# Patient Record
Sex: Female | Born: 1940 | Race: White | Hispanic: No | Marital: Married | Smoking: Former smoker
Health system: Southern US, Community
[De-identification: ages and names within clinical notes are randomized; demographics above are authoritative.]

## PROBLEM LIST (undated history)

## (undated) DIAGNOSIS — R0600 Dyspnea, unspecified: Secondary | ICD-10-CM

## (undated) DIAGNOSIS — R0609 Other forms of dyspnea: Secondary | ICD-10-CM

## (undated) DIAGNOSIS — I10 Essential (primary) hypertension: Secondary | ICD-10-CM

## (undated) DIAGNOSIS — I2510 Atherosclerotic heart disease of native coronary artery without angina pectoris: Secondary | ICD-10-CM

## (undated) DIAGNOSIS — C801 Malignant (primary) neoplasm, unspecified: Secondary | ICD-10-CM

## (undated) DIAGNOSIS — Z9889 Other specified postprocedural states: Secondary | ICD-10-CM

## (undated) DIAGNOSIS — C4490 Unspecified malignant neoplasm of skin, unspecified: Secondary | ICD-10-CM

## (undated) DIAGNOSIS — E785 Hyperlipidemia, unspecified: Secondary | ICD-10-CM

## (undated) DIAGNOSIS — R112 Nausea with vomiting, unspecified: Secondary | ICD-10-CM

## (undated) DIAGNOSIS — E119 Type 2 diabetes mellitus without complications: Secondary | ICD-10-CM

## (undated) DIAGNOSIS — Z955 Presence of coronary angioplasty implant and graft: Secondary | ICD-10-CM

## (undated) DIAGNOSIS — R51 Headache: Secondary | ICD-10-CM

## (undated) DIAGNOSIS — G8929 Other chronic pain: Secondary | ICD-10-CM

## (undated) DIAGNOSIS — I639 Cerebral infarction, unspecified: Secondary | ICD-10-CM

## (undated) DIAGNOSIS — F32A Depression, unspecified: Secondary | ICD-10-CM

## (undated) DIAGNOSIS — Z8489 Family history of other specified conditions: Secondary | ICD-10-CM

## (undated) DIAGNOSIS — Z800 Family history of malignant neoplasm of digestive organs: Secondary | ICD-10-CM

## (undated) DIAGNOSIS — S069X9A Unspecified intracranial injury with loss of consciousness of unspecified duration, initial encounter: Secondary | ICD-10-CM

## (undated) DIAGNOSIS — Z9289 Personal history of other medical treatment: Secondary | ICD-10-CM

## (undated) DIAGNOSIS — M8580 Other specified disorders of bone density and structure, unspecified site: Secondary | ICD-10-CM

## (undated) DIAGNOSIS — E039 Hypothyroidism, unspecified: Secondary | ICD-10-CM

## (undated) DIAGNOSIS — J189 Pneumonia, unspecified organism: Secondary | ICD-10-CM

## (undated) DIAGNOSIS — M1990 Unspecified osteoarthritis, unspecified site: Secondary | ICD-10-CM

## (undated) DIAGNOSIS — M47812 Spondylosis without myelopathy or radiculopathy, cervical region: Secondary | ICD-10-CM

## (undated) DIAGNOSIS — K219 Gastro-esophageal reflux disease without esophagitis: Secondary | ICD-10-CM

## (undated) DIAGNOSIS — IMO0001 Reserved for inherently not codable concepts without codable children: Secondary | ICD-10-CM

## (undated) DIAGNOSIS — F419 Anxiety disorder, unspecified: Secondary | ICD-10-CM

## (undated) DIAGNOSIS — F329 Major depressive disorder, single episode, unspecified: Secondary | ICD-10-CM

## (undated) DIAGNOSIS — J42 Unspecified chronic bronchitis: Secondary | ICD-10-CM

## (undated) DIAGNOSIS — J309 Allergic rhinitis, unspecified: Secondary | ICD-10-CM

## (undated) DIAGNOSIS — N939 Abnormal uterine and vaginal bleeding, unspecified: Secondary | ICD-10-CM

## (undated) DIAGNOSIS — G4730 Sleep apnea, unspecified: Secondary | ICD-10-CM

## (undated) DIAGNOSIS — E079 Disorder of thyroid, unspecified: Secondary | ICD-10-CM

## (undated) DIAGNOSIS — Z87442 Personal history of urinary calculi: Secondary | ICD-10-CM

## (undated) DIAGNOSIS — M549 Dorsalgia, unspecified: Secondary | ICD-10-CM

## (undated) DIAGNOSIS — I509 Heart failure, unspecified: Secondary | ICD-10-CM

## (undated) DIAGNOSIS — Z87440 Personal history of urinary (tract) infections: Secondary | ICD-10-CM

## (undated) DIAGNOSIS — R519 Headache, unspecified: Secondary | ICD-10-CM

## (undated) DIAGNOSIS — Z872 Personal history of diseases of the skin and subcutaneous tissue: Secondary | ICD-10-CM

## (undated) HISTORY — PX: CARDIAC CATHETERIZATION: SHX172

## (undated) HISTORY — PX: SKIN CANCER EXCISION: SHX779

## (undated) HISTORY — PX: CATARACT EXTRACTION, BILATERAL: SHX1313

## (undated) HISTORY — PX: HX COLONOSCOPY: 2100001147

## (undated) HISTORY — PX: HX EYE SURGERY: 2100001143

## (undated) HISTORY — PX: LAPAROSCOPIC CHOLECYSTECTOMY: SUR755

## (undated) HISTORY — DX: Essential (primary) hypertension: I10

## (undated) HISTORY — PX: HX PARTIAL HYSTERECTOMY: SHX80

## (undated) HISTORY — PX: HX WISDOM TEETH EXTRACTION: SHX21

## (undated) HISTORY — DX: Unspecified malignant neoplasm of skin, unspecified: C44.90

## (undated) HISTORY — PX: HX HEART CATHETERIZATION: SHX148

## (undated) HISTORY — DX: Atherosclerotic heart disease of native coronary artery without angina pectoris: I25.10

## (undated) HISTORY — DX: Spondylosis without myelopathy or radiculopathy, cervical region: M47.812

## (undated) HISTORY — DX: Gastro-esophageal reflux disease without esophagitis: K21.9

## (undated) HISTORY — PX: TOTAL THYROIDECTOMY: SHX2547

## (undated) HISTORY — DX: Personal history of urinary (tract) infections: Z87.440

## (undated) HISTORY — DX: Personal history of diseases of the skin and subcutaneous tissue: Z87.2

## (undated) HISTORY — DX: Family history of malignant neoplasm of digestive organs: Z80.0

## (undated) HISTORY — PX: NASAL FRACTURE SURGERY: SHX718

## (undated) HISTORY — DX: Other specified disorders of bone density and structure, unspecified site: M85.80

## (undated) HISTORY — PX: CORONARY ANGIOPLASTY: SHX604

## (undated) HISTORY — DX: Hyperlipidemia, unspecified: E78.5

## (undated) HISTORY — PX: COLONOSCOPY: SHX174

## (undated) HISTORY — DX: Disorder of thyroid, unspecified: E07.9

## (undated) HISTORY — DX: Abnormal uterine and vaginal bleeding, unspecified: N93.9

## (undated) HISTORY — DX: Allergic rhinitis, unspecified: J30.9

## (undated) HISTORY — PX: VAGINAL HYSTERECTOMY: SUR661

## (undated) HISTORY — DX: Anxiety disorder, unspecified: F41.9

---

## 1968-12-03 DIAGNOSIS — E079 Disorder of thyroid, unspecified: Secondary | ICD-10-CM

## 1968-12-03 HISTORY — DX: Disorder of thyroid, unspecified: E07.9

## 1976-12-03 HISTORY — PX: HX THYROIDECTOMY: SHX17

## 1978-12-03 HISTORY — PX: RECONSTRUCTION OF NOSE: SHX2301

## 1998-08-15 ENCOUNTER — Ambulatory Visit (HOSPITAL_COMMUNITY): Admission: RE | Admit: 1998-08-15 | Discharge: 1998-08-15 | Payer: Self-pay | Admitting: *Deleted

## 1998-09-07 ENCOUNTER — Ambulatory Visit (HOSPITAL_COMMUNITY): Admission: RE | Admit: 1998-09-07 | Discharge: 1998-09-08 | Payer: Self-pay | Admitting: *Deleted

## 1999-12-08 ENCOUNTER — Encounter: Payer: Self-pay | Admitting: *Deleted

## 1999-12-08 ENCOUNTER — Ambulatory Visit (HOSPITAL_COMMUNITY): Admission: RE | Admit: 1999-12-08 | Discharge: 1999-12-08 | Payer: Self-pay | Admitting: *Deleted

## 2000-05-28 ENCOUNTER — Encounter: Admission: RE | Admit: 2000-05-28 | Discharge: 2000-05-28 | Payer: Self-pay | Admitting: *Deleted

## 2000-05-28 ENCOUNTER — Encounter: Payer: Self-pay | Admitting: *Deleted

## 2001-06-06 ENCOUNTER — Encounter: Payer: Self-pay | Admitting: Internal Medicine

## 2001-06-06 ENCOUNTER — Encounter: Admission: RE | Admit: 2001-06-06 | Discharge: 2001-06-06 | Payer: Self-pay | Admitting: Internal Medicine

## 2002-07-03 ENCOUNTER — Encounter: Payer: Self-pay | Admitting: Internal Medicine

## 2002-07-03 ENCOUNTER — Encounter: Admission: RE | Admit: 2002-07-03 | Discharge: 2002-07-03 | Payer: Self-pay | Admitting: Internal Medicine

## 2003-08-05 ENCOUNTER — Encounter: Payer: Self-pay | Admitting: Internal Medicine

## 2003-08-05 ENCOUNTER — Encounter: Admission: RE | Admit: 2003-08-05 | Discharge: 2003-08-05 | Payer: Self-pay | Admitting: Internal Medicine

## 2004-08-22 ENCOUNTER — Ambulatory Visit (HOSPITAL_COMMUNITY): Admission: RE | Admit: 2004-08-22 | Discharge: 2004-08-22 | Payer: Self-pay | Admitting: Internal Medicine

## 2004-09-04 ENCOUNTER — Encounter: Admission: RE | Admit: 2004-09-04 | Discharge: 2004-09-04 | Payer: Self-pay | Admitting: Internal Medicine

## 2004-12-05 ENCOUNTER — Other Ambulatory Visit: Admission: RE | Admit: 2004-12-05 | Discharge: 2004-12-05 | Payer: Self-pay | Admitting: Internal Medicine

## 2005-07-06 ENCOUNTER — Other Ambulatory Visit: Admission: RE | Admit: 2005-07-06 | Discharge: 2005-07-06 | Payer: Self-pay | Admitting: Obstetrics and Gynecology

## 2005-08-15 ENCOUNTER — Encounter: Admission: RE | Admit: 2005-08-15 | Discharge: 2005-08-15 | Payer: Self-pay | Admitting: Obstetrics and Gynecology

## 2005-10-19 ENCOUNTER — Ambulatory Visit (HOSPITAL_COMMUNITY): Admission: RE | Admit: 2005-10-19 | Discharge: 2005-10-19 | Payer: Self-pay | Admitting: Gastroenterology

## 2005-11-16 ENCOUNTER — Ambulatory Visit (HOSPITAL_COMMUNITY): Admission: RE | Admit: 2005-11-16 | Discharge: 2005-11-16 | Payer: Self-pay | Admitting: Internal Medicine

## 2007-01-10 ENCOUNTER — Ambulatory Visit (HOSPITAL_COMMUNITY): Admission: RE | Admit: 2007-01-10 | Discharge: 2007-01-10 | Payer: Self-pay | Admitting: Internal Medicine

## 2007-12-04 DIAGNOSIS — I639 Cerebral infarction, unspecified: Secondary | ICD-10-CM

## 2007-12-04 HISTORY — DX: Cerebral infarction, unspecified: I63.9

## 2008-02-06 ENCOUNTER — Ambulatory Visit (HOSPITAL_COMMUNITY): Admission: RE | Admit: 2008-02-06 | Discharge: 2008-02-06 | Payer: Self-pay | Admitting: Internal Medicine

## 2008-02-23 ENCOUNTER — Encounter: Admission: RE | Admit: 2008-02-23 | Discharge: 2008-02-23 | Payer: Self-pay | Admitting: Internal Medicine

## 2008-12-03 DIAGNOSIS — I639 Cerebral infarction, unspecified: Secondary | ICD-10-CM

## 2008-12-03 HISTORY — DX: Cerebral infarction, unspecified: I63.9

## 2009-02-25 ENCOUNTER — Ambulatory Visit (HOSPITAL_COMMUNITY): Admission: RE | Admit: 2009-02-25 | Discharge: 2009-02-25 | Payer: Self-pay | Admitting: Internal Medicine

## 2009-09-02 ENCOUNTER — Encounter: Admission: RE | Admit: 2009-09-02 | Discharge: 2009-09-02 | Payer: Self-pay | Admitting: Internal Medicine

## 2010-03-17 ENCOUNTER — Ambulatory Visit (HOSPITAL_COMMUNITY): Admission: RE | Admit: 2010-03-17 | Discharge: 2010-03-17 | Payer: Self-pay | Admitting: Internal Medicine

## 2010-08-31 ENCOUNTER — Encounter: Admission: RE | Admit: 2010-08-31 | Discharge: 2010-08-31 | Payer: Self-pay | Admitting: Internal Medicine

## 2010-12-13 ENCOUNTER — Encounter
Admission: RE | Admit: 2010-12-13 | Discharge: 2010-12-13 | Payer: Self-pay | Source: Home / Self Care | Attending: Internal Medicine | Admitting: Internal Medicine

## 2010-12-18 ENCOUNTER — Encounter
Admission: RE | Admit: 2010-12-18 | Discharge: 2010-12-18 | Payer: Self-pay | Source: Home / Self Care | Attending: Internal Medicine | Admitting: Internal Medicine

## 2010-12-24 ENCOUNTER — Encounter: Payer: Self-pay | Admitting: Obstetrics and Gynecology

## 2011-03-30 ENCOUNTER — Other Ambulatory Visit (HOSPITAL_COMMUNITY): Payer: Self-pay | Admitting: Internal Medicine

## 2011-03-30 DIAGNOSIS — Z1231 Encounter for screening mammogram for malignant neoplasm of breast: Secondary | ICD-10-CM

## 2011-04-11 ENCOUNTER — Ambulatory Visit (HOSPITAL_COMMUNITY)
Admission: RE | Admit: 2011-04-11 | Discharge: 2011-04-11 | Disposition: A | Discharge status: Home / Self Care | Payer: Medicare Other | Source: Ambulatory Visit | Attending: Internal Medicine | Admitting: Internal Medicine

## 2011-04-11 DIAGNOSIS — Z1231 Encounter for screening mammogram for malignant neoplasm of breast: Secondary | ICD-10-CM | POA: Insufficient documentation

## 2011-04-20 NOTE — Op Note (Signed)
NAME:  Barbara Carroll, Barbara Carroll NO.:  [id number]   MEDICAL RECORD NO.:  [id number]          PATIENT TYPE:  AMB   LOCATION:  ENDO                         FACILITY:  [facility]   PHYSICIAN:  Graylin Shiver, M.D.   DATE OF BIRTH:  1941/01/10   DATE OF PROCEDURE:  10/19/2005  DATE OF DISCHARGE:                                 OPERATIVE REPORT   PROCEDURE:  Colonoscopy.   INDICATIONS FOR PROCEDURE:  Family history of colon cancer in the patient's  mother.   Informed consent was obtained after explanation of the risks of bleeding,  infection and perforation.   PREMEDICATION:  Fentanyl 75 mcg IV, Versed 7 mg IV.   PROCEDURE:  With the patient in the left lateral decubitus position, a  rectal exam was performed, no masses were felt. The Olympus colonoscope was  inserted into the rectum and advanced around the colon to the cecum. Cecal  landmarks were identified. The cecum and ascending colon were normal. The  transverse colon normal. The descending colon and sigmoid revealed a few  diverticula, the rectum was normal. She tolerated the procedure well without  complications.   IMPRESSION:  Diverticulosis of the left colon.   I would recommend a follow-up screening colonoscopy again in 5 years due to  the family history.           ______________________________  Graylin Shiver, M.D.     SFG/MEDQ  D:  10/19/2005  T:  10/19/2005  Job:  [id number]   cc:   Leonette Most A. Sydnee Cabal, MD  Fax: [phone]

## 2011-12-03 ENCOUNTER — Other Ambulatory Visit: Payer: Self-pay | Admitting: Internal Medicine

## 2011-12-03 ENCOUNTER — Encounter: Payer: Self-pay | Admitting: Internal Medicine

## 2011-12-03 ENCOUNTER — Ambulatory Visit
Admission: RE | Admit: 2011-12-03 | Discharge: 2011-12-03 | Disposition: A | Discharge status: Home / Self Care | Payer: Medicare Other | Source: Ambulatory Visit | Attending: Internal Medicine | Admitting: Internal Medicine

## 2011-12-27 ENCOUNTER — Other Ambulatory Visit: Payer: Self-pay | Admitting: Internal Medicine

## 2011-12-27 DIAGNOSIS — R200 Anesthesia of skin: Secondary | ICD-10-CM

## 2011-12-28 ENCOUNTER — Other Ambulatory Visit: Payer: Medicare Other

## 2011-12-31 ENCOUNTER — Ambulatory Visit
Admission: RE | Admit: 2011-12-31 | Discharge: 2011-12-31 | Disposition: A | Discharge status: Home / Self Care | Payer: Medicare Other | Source: Ambulatory Visit | Attending: Internal Medicine | Admitting: Internal Medicine

## 2011-12-31 DIAGNOSIS — R200 Anesthesia of skin: Secondary | ICD-10-CM

## 2012-01-21 ENCOUNTER — Other Ambulatory Visit: Payer: Self-pay | Admitting: Neurosurgery

## 2012-01-21 DIAGNOSIS — M542 Cervicalgia: Secondary | ICD-10-CM

## 2012-01-21 DIAGNOSIS — R29810 Facial weakness: Secondary | ICD-10-CM

## 2012-01-28 ENCOUNTER — Ambulatory Visit
Admission: RE | Admit: 2012-01-28 | Discharge: 2012-01-28 | Disposition: A | Discharge status: Home / Self Care | Payer: Medicare Other | Source: Ambulatory Visit | Attending: Neurosurgery | Admitting: Neurosurgery

## 2012-01-28 DIAGNOSIS — R29810 Facial weakness: Secondary | ICD-10-CM

## 2012-01-28 DIAGNOSIS — M542 Cervicalgia: Secondary | ICD-10-CM

## 2012-01-28 MED ORDER — GADOBENATE DIMEGLUMINE 529 MG/ML IV SOLN
15.0000 mL | Freq: Once | INTRAVENOUS | Status: AC | PRN
  Administered 2012-01-28: 15 mL via INTRAVENOUS

## 2012-04-30 ENCOUNTER — Other Ambulatory Visit (HOSPITAL_COMMUNITY): Payer: Self-pay | Admitting: Internal Medicine

## 2012-04-30 DIAGNOSIS — Z1231 Encounter for screening mammogram for malignant neoplasm of breast: Secondary | ICD-10-CM

## 2012-05-23 ENCOUNTER — Ambulatory Visit (HOSPITAL_COMMUNITY)
Admission: RE | Admit: 2012-05-23 | Discharge: 2012-05-23 | Disposition: A | Discharge status: Home / Self Care | Payer: Medicare Other | Source: Ambulatory Visit | Attending: Internal Medicine | Admitting: Internal Medicine

## 2012-05-23 DIAGNOSIS — Z1231 Encounter for screening mammogram for malignant neoplasm of breast: Secondary | ICD-10-CM | POA: Insufficient documentation

## 2013-01-19 ENCOUNTER — Other Ambulatory Visit: Payer: Self-pay | Admitting: Internal Medicine

## 2013-01-19 DIAGNOSIS — R52 Pain, unspecified: Secondary | ICD-10-CM

## 2013-01-19 DIAGNOSIS — R609 Edema, unspecified: Secondary | ICD-10-CM

## 2013-01-21 ENCOUNTER — Ambulatory Visit
Admission: RE | Admit: 2013-01-21 | Discharge: 2013-01-21 | Disposition: A | Discharge status: Home / Self Care | Payer: Medicare Other | Source: Ambulatory Visit | Attending: Internal Medicine | Admitting: Internal Medicine

## 2013-01-21 DIAGNOSIS — R609 Edema, unspecified: Secondary | ICD-10-CM

## 2013-01-21 DIAGNOSIS — R52 Pain, unspecified: Secondary | ICD-10-CM

## 2013-01-29 ENCOUNTER — Other Ambulatory Visit: Payer: Self-pay | Admitting: Internal Medicine

## 2013-01-29 DIAGNOSIS — R109 Unspecified abdominal pain: Secondary | ICD-10-CM

## 2013-01-30 ENCOUNTER — Ambulatory Visit
Admission: RE | Admit: 2013-01-30 | Discharge: 2013-01-30 | Disposition: A | Discharge status: Home / Self Care | Payer: Medicare Other | Source: Ambulatory Visit | Attending: Internal Medicine | Admitting: Internal Medicine

## 2013-01-30 DIAGNOSIS — R109 Unspecified abdominal pain: Secondary | ICD-10-CM

## 2013-06-19 ENCOUNTER — Other Ambulatory Visit (HOSPITAL_COMMUNITY): Payer: Self-pay | Admitting: Internal Medicine

## 2013-06-19 DIAGNOSIS — Z1231 Encounter for screening mammogram for malignant neoplasm of breast: Secondary | ICD-10-CM

## 2013-07-10 ENCOUNTER — Ambulatory Visit (HOSPITAL_COMMUNITY)
Admission: RE | Admit: 2013-07-10 | Discharge: 2013-07-10 | Disposition: A | Discharge status: Home / Self Care | Payer: Medicare Other | Source: Ambulatory Visit | Attending: Internal Medicine | Admitting: Internal Medicine

## 2013-07-10 DIAGNOSIS — Z1231 Encounter for screening mammogram for malignant neoplasm of breast: Secondary | ICD-10-CM | POA: Insufficient documentation

## 2013-07-13 ENCOUNTER — Other Ambulatory Visit: Payer: Self-pay | Admitting: Internal Medicine

## 2013-07-13 DIAGNOSIS — R928 Other abnormal and inconclusive findings on diagnostic imaging of breast: Secondary | ICD-10-CM

## 2013-07-30 ENCOUNTER — Ambulatory Visit
Admission: RE | Admit: 2013-07-30 | Discharge: 2013-07-30 | Disposition: A | Discharge status: Home / Self Care | Payer: Medicare Other | Source: Ambulatory Visit | Attending: Internal Medicine | Admitting: Internal Medicine

## 2013-07-30 DIAGNOSIS — R928 Other abnormal and inconclusive findings on diagnostic imaging of breast: Secondary | ICD-10-CM

## 2013-08-20 ENCOUNTER — Other Ambulatory Visit: Payer: Self-pay | Admitting: Internal Medicine

## 2013-08-20 ENCOUNTER — Ambulatory Visit
Admission: RE | Admit: 2013-08-20 | Discharge: 2013-08-20 | Disposition: A | Discharge status: Home / Self Care | Payer: Medicare Other | Source: Ambulatory Visit | Attending: Internal Medicine | Admitting: Internal Medicine

## 2013-08-20 DIAGNOSIS — R1031 Right lower quadrant pain: Secondary | ICD-10-CM

## 2013-08-20 DIAGNOSIS — R109 Unspecified abdominal pain: Secondary | ICD-10-CM

## 2013-08-29 ENCOUNTER — Encounter: Payer: Self-pay | Admitting: Interventional Cardiology

## 2013-09-08 ENCOUNTER — Ambulatory Visit: Payer: Medicare Other | Admitting: Interventional Cardiology

## 2013-10-01 ENCOUNTER — Ambulatory Visit: Payer: Medicare Other | Admitting: Interventional Cardiology

## 2013-10-06 ENCOUNTER — Encounter: Payer: Self-pay | Admitting: Interventional Cardiology

## 2013-10-06 ENCOUNTER — Ambulatory Visit (INDEPENDENT_AMBULATORY_CARE_PROVIDER_SITE_OTHER): Payer: Medicare Other | Admitting: Interventional Cardiology

## 2013-10-06 VITALS — BP 126/68 | HR 69 | Ht 64.0 in | Wt 165.0 lb

## 2013-10-06 DIAGNOSIS — R079 Chest pain, unspecified: Secondary | ICD-10-CM

## 2013-10-06 DIAGNOSIS — R9430 Abnormal result of cardiovascular function study, unspecified: Secondary | ICD-10-CM

## 2013-10-06 DIAGNOSIS — I6350 Cerebral infarction due to unspecified occlusion or stenosis of unspecified cerebral artery: Secondary | ICD-10-CM

## 2013-10-06 DIAGNOSIS — I639 Cerebral infarction, unspecified: Secondary | ICD-10-CM | POA: Insufficient documentation

## 2013-10-06 DIAGNOSIS — R0602 Shortness of breath: Secondary | ICD-10-CM

## 2013-10-06 NOTE — Patient Instructions (Signed)
Your physician has requested that you have en exercise stress myoview. For further information please visit [email or web address] Please follow instruction sheet, as given.  Your physician wants you to follow-up in: 1 year with Dr. Eldridge Dace. You will receive a reminder letter in the mail two months in advance. If you don't receive a letter, please call our office to schedule the follow-up appointment. s

## 2013-10-06 NOTE — Progress Notes (Signed)
Patient ID: Barbara Carroll, female   DOB: Aug 24, 1941, 72 y.o.   MRN: [id number]    [address] [place], [place]  [address] Phone: [phone] Fax:  [phone]  Date:  10/06/2013   ID:  Barbara Carroll, DOB [birth year], MRN [id number]  PCP:  No primary provider on file.      History of Present Illness: Barbara Carroll is a 72 y.o. female  who had a stress test in 2011 which showed TID . She has had some chest pressure, and used NTG a few weeks ago.  She wil also chew a baby aspirin.  It will feel like a pain the let arm and face. It occurred while she was in bed.  It os more frequent that it happens with activity and she has to sit down. She has used some NTG and xanax at times when she has symptoms. WHen she cleans house, she does not have symptoms. She has been stressed due to her husband's health.  She has used the NTG only a few times. She does not feel that her sx are bad enough that she wants to do a cath. She had a light stroke in 2011 and has been started on plavix.   Vital Signs      Wt Readings from Last 3 Encounters:  10/06/13 165 lb (74.844 kg)     Past Medical History  Diagnosis Date  . Dyslipidemia   . Hypertension   . 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 Back pain   . GERD (gastroesophageal reflux disease)   . Allergic rhinitis   . Osteopenia     bone density 2009,vitamin D level, normal 2009  . H/O actinic keratosis   . Vaginal bleeding     hysterectomy many years ago,seen by/GYN doctor delcambre,abdominal and pelvic ultrasound 2009 negative   . Hx: UTI (urinary tract infection)     Current Outpatient Prescriptions  Medication Sig Dispense Refill  . ALPRAZolam (XANAX) 0.5 MG tablet Take 0.5 mg by mouth at bedtime as needed for sleep.      Marland Kitchen amLODipine (NORVASC) 10 MG tablet Take 10 mg by mouth daily.      Marland Kitchen aspirin 81 MG EC tablet Take 81 mg by mouth daily. Swallow whole.      . B Complex Vitamins (B-COMPLEX/B-12 PO) Take by mouth.       . cholecalciferol (VITAMIN D) 1000 UNITS tablet Take 1,000 Units by mouth daily.      . clopidogrel (PLAVIX) 75 MG tablet Take 75 mg by mouth daily.      . Coenzyme Q10 (CO Q 10 PO) Take by mouth. 1 tab daily      . fish oil-omega-3 fatty acids 1000 MG capsule Take 2 g by mouth daily.      . lansoprazole (PREVACID) 15 MG capsule Take 15 mg by mouth daily.      Marland Kitchen levothyroxine (SYNTHROID, LEVOTHROID) 112 MCG tablet Take 112 mcg by mouth daily before breakfast.      . lovastatin (MEVACOR) 20 MG tablet Take 20 mg by mouth at bedtime.      . Multiple Vitamin (MULTIVITAMIN) tablet Take 1 tablet by mouth daily.      . nitroGLYCERIN (NITROSTAT) 0.4 MG SL tablet Place 0.4 mg under the tongue every 5 (five) minutes as needed for chest pain.      . valsartan-hydrochlorothiazide (DIOVAN-HCT) 320-25 MG per tablet Take 1 tablet by mouth daily.  No current facility-administered medications for this visit.    Allergies:    Allergies  Allergen Reactions  . Iohexol Hives     Desc: HIVES SEVERAL HRS S/P IV CONTRAST.. [place] LAST SCAN W/ BENADRYL PREMED @ [facility] '01/AC.     Social History:  The patient  reports that she has never smoked. She does not have any smokeless tobacco history on file.   Family History:  The patient's family history is not on file.   ROS:  Please see the history of present illness.  No nausea, vomiting.  No fevers, chills.  No focal weakness.  No dysuria.  All other systems reviewed and negative.   PHYSICAL EXAM: VS:  BP 126/68  Pulse 69  Ht 5\' 4"  (1.626 m)  Wt 165 lb (74.844 kg)  BMI 28.31 kg/m2 Well nourished, well developed, in no acute distress HEENT: normal Neck: no JVD, no carotid bruits Cardiac:  normal S1, S2; RRR;  Lungs:  clear to auscultation bilaterally, no wheezing, rhonchi or rales Abd: soft, nontender, no hepatomegaly Ext: no edema Skin: warm and dry Neuro:   no focal abnormalities noted  EKG:  NSR, nonspecific ST segment changes    ASSESSMENT AND  PLAN:  Shortness of breath dyspnea  Diagnostic Imaging:EKG Harward,Amy 09/08/2012 09:58:56 AM > Junior Huezo,JAY 09/08/2012 10:38:18 AM > NSR, nonspecific ST segment changes  Still with DOE.  WIll eval for ischemia with a repeat stress test..  continue to try to exercise on a regular basis. This may be more related to lack of stamina.    2. CVA in 2013 Diagnostic Imaging:[facility] Echocardiogram (Ordered for 09/08/2012) Edwards,William 09/22/2012 03:14:05 PM > Echo report charted. Harward,Amy 09/22/2012 04:45:53 PM > lmtrc Harward,Amy 09/22/2012 05:03:54 PM > Pt notified. , [facility] Telemetry (Ordered for 09/08/2012)  Apparently had CVA in March 2013. Per neurology, they have requested echo and monitor in 2013.  No AFib.  Normal LV function.   LDL 140 in 6/14,  She did better with Crestor but it was not covered by insurance.  She did not tolerate lipitor due to muscle pain.  LDL above target given h/o stroke.   3. Nonspecific abnormal unspecified cardiovascular function study  TID on prior stress test in 2011. Occasional chest discomfort, but not related to exertion. SHe is not interested in a cath. Will repeat stress test to see if there are now other high risk features that would prompt cath.   Fatigue: she does snore.  Check for sleep apnea sx at home.    Chest pain relieved with NTG.  Will recheck stress test.  Signed, Fredric Mare, MD, [facility] 10/06/2013 11:40 AM

## 2013-10-22 ENCOUNTER — Ambulatory Visit (HOSPITAL_COMMUNITY): Payer: Medicare Other | Attending: Interventional Cardiology | Admitting: Radiology

## 2013-10-22 VITALS — BP 112/57 | HR 67 | Ht 64.0 in | Wt 159.0 lb

## 2013-10-22 DIAGNOSIS — R0989 Other specified symptoms and signs involving the circulatory and respiratory systems: Secondary | ICD-10-CM | POA: Insufficient documentation

## 2013-10-22 DIAGNOSIS — R0602 Shortness of breath: Secondary | ICD-10-CM

## 2013-10-22 DIAGNOSIS — Z8673 Personal history of transient ischemic attack (TIA), and cerebral infarction without residual deficits: Secondary | ICD-10-CM | POA: Insufficient documentation

## 2013-10-22 DIAGNOSIS — I779 Disorder of arteries and arterioles, unspecified: Secondary | ICD-10-CM | POA: Insufficient documentation

## 2013-10-22 DIAGNOSIS — R079 Chest pain, unspecified: Secondary | ICD-10-CM | POA: Insufficient documentation

## 2013-10-22 DIAGNOSIS — I10 Essential (primary) hypertension: Secondary | ICD-10-CM | POA: Insufficient documentation

## 2013-10-22 DIAGNOSIS — R9430 Abnormal result of cardiovascular function study, unspecified: Secondary | ICD-10-CM

## 2013-10-22 DIAGNOSIS — R0609 Other forms of dyspnea: Secondary | ICD-10-CM | POA: Insufficient documentation

## 2013-10-22 DIAGNOSIS — Z8249 Family history of ischemic heart disease and other diseases of the circulatory system: Secondary | ICD-10-CM | POA: Insufficient documentation

## 2013-10-22 DIAGNOSIS — E785 Hyperlipidemia, unspecified: Secondary | ICD-10-CM | POA: Insufficient documentation

## 2013-10-22 DIAGNOSIS — R002 Palpitations: Secondary | ICD-10-CM | POA: Insufficient documentation

## 2013-10-22 DIAGNOSIS — Z87891 Personal history of nicotine dependence: Secondary | ICD-10-CM | POA: Insufficient documentation

## 2013-10-22 DIAGNOSIS — R42 Dizziness and giddiness: Secondary | ICD-10-CM | POA: Insufficient documentation

## 2013-10-22 MED ORDER — TECHNETIUM TC 99M SESTAMIBI GENERIC - CARDIOLITE
30.0000 | Freq: Once | INTRAVENOUS | Status: AC | PRN
  Administered 2013-10-22: 30 via INTRAVENOUS

## 2013-10-22 MED ORDER — REGADENOSON 0.4 MG/5ML IV SOLN
0.4000 mg | Freq: Once | INTRAVENOUS | Status: AC
  Administered 2013-10-22: 0.4 mg via INTRAVENOUS

## 2013-10-22 MED ORDER — TECHNETIUM TC 99M SESTAMIBI GENERIC - CARDIOLITE
10.0000 | Freq: Once | INTRAVENOUS | Status: AC | PRN
  Administered 2013-10-22: 10 via INTRAVENOUS

## 2013-10-22 NOTE — Progress Notes (Signed)
  [facility] SITE 3 NUCLEAR MED [address] [place], [place] [address] [phone]    Cardiology Nuclear Med Study  Barbara Carroll is a 72 y.o. female     MRN : [id number]     DOB: 02-Mar-1941  Procedure Date: 10/22/2013  Nuclear Med Background Indication for Stress Test:  Evaluation for Ischemia History:  No prior known history of CAD, '11 Myocardial Perfusion Imaging-Transient Ischemic dilatation, EF=76% at [place], and '13 Echo: EF=60-65% Cardiac Risk Factors: Carotid Disease, CVA, Family History - CAD, History of Smoking, Hypertension and Lipids  Symptoms: Chest Pain with/without exertion (last occurrence one month ago), Dizziness, DOE and Palpitations   Nuclear Pre-Procedure Caffeine/Decaff Intake:  None NPO After: 7:00pm   Lungs:  clear O2 Sat: 96% on room air. IV 0.9% NS with Angio Cath:  22g  IV Site: R Hand  IV Started by:  Cathlyn Parsons, RN  Chest Size (in):  38 Cup Size: C  Height: 5\' 4"  (1.626 m)  Weight:  159 lb (72.122 kg)  BMI:  Body mass index is 27.28 kg/(m^2). Tech Comments:  n/a    Nuclear Med Study 1 or 2 day study: 1 day  Stress Test Type:  Lexiscan  Reading MD: Lance Muss, MD  Order Authorizing Provider:  Tonna Corner  Resting Radionuclide: Technetium 24m Sestamibi  Resting Radionuclide Dose: 11.0 mCi   Stress Radionuclide:  Technetium 55m Sestamibi  Stress Radionuclide Dose: 33.0 mCi           Stress Protocol Rest HR: 67 Stress HR: 96  Rest BP: 112/57 Stress BP: 131/49  Exercise Time (min): n/a METS: n/a   Predicted Max HR: 147 bpm % Max HR: 65.31 bpm Rate Pressure Product: 06301   Dose of Adenosine (mg):  n/a Dose of Lexiscan: 0.4 mg  Dose of Atropine (mg): n/a Dose of Dobutamine: n/a mcg/kg/min (at max HR)  Stress Test Technologist: Irean Hong, RN  Nuclear Technologist:  Domenic Polite, CNMT     Rest Procedure:  Myocardial perfusion imaging was performed at rest 45 minutes following the intravenous  administration of Technetium 9m Sestamibi. Rest ECG: NSR with non-specific ST-T wave changes  Stress Procedure:  The patient received IV Lexiscan 0.4 mg over 15-seconds.  Technetium 3m Sestamibi injected at 30-seconds.The patient complained of chest tightness, and headache with Lexiscan.  Quantitative spect images were obtained after a 45 minute delay. Stress ECG: Minimal ST segment changes laterally.  QPS Raw Data Images:  Normal; no motion artifact; normal heart/lung ratio. Stress Images:  Normal homogeneous uptake in all areas of the myocardium. Rest Images:  There is decreased uptake in the anterior wall. Subtraction (SDS):  No evidence of ischemia. Transient Ischemic Dilatation (Normal <1.22):  0.90 Lung/Heart Ratio (Normal <0.45):  0.28  Quantitative Gated Spect Images QGS EDV:  58 ml QGS ESV:  15 ml  Impression Exercise Capacity:  Lexiscan with no exercise. BP Response:  Normal blood pressure response. Clinical Symptoms:  Mild chest pain/dyspnea. ECG Impression:  Nondiagnostic ECG Comparison with Prior Nuclear Study: No images to compare  Overall Impression:  No evidence of ischemia.    LV Ejection Fraction: 74%.  LV Wall Motion:  NL LV Function; NL Wall Motion  [name]., MD, [facility]

## 2013-12-03 DIAGNOSIS — J189 Pneumonia, unspecified organism: Secondary | ICD-10-CM

## 2013-12-03 HISTORY — PX: CATARACT EXTRACTION W/ INTRAOCULAR LENS  IMPLANT, BILATERAL: SHX1307

## 2013-12-03 HISTORY — DX: Pneumonia, unspecified organism: J18.9

## 2014-03-03 ENCOUNTER — Encounter: Payer: Self-pay | Admitting: Interventional Cardiology

## 2014-03-17 ENCOUNTER — Ambulatory Visit
Admission: RE | Admit: 2014-03-17 | Discharge: 2014-03-17 | Disposition: A | Discharge status: Home / Self Care | Payer: Medicare Other | Source: Ambulatory Visit | Attending: Internal Medicine | Admitting: Internal Medicine

## 2014-03-17 ENCOUNTER — Other Ambulatory Visit: Payer: Self-pay | Admitting: Internal Medicine

## 2014-03-17 DIAGNOSIS — R609 Edema, unspecified: Secondary | ICD-10-CM

## 2014-03-23 ENCOUNTER — Encounter: Payer: Self-pay | Admitting: Interventional Cardiology

## 2014-05-25 ENCOUNTER — Encounter: Payer: Self-pay | Admitting: *Deleted

## 2014-06-14 ENCOUNTER — Other Ambulatory Visit: Payer: Self-pay | Admitting: Internal Medicine

## 2014-06-14 ENCOUNTER — Ambulatory Visit
Admission: RE | Admit: 2014-06-14 | Discharge: 2014-06-14 | Disposition: A | Discharge status: Home / Self Care | Payer: Medicare Other | Source: Ambulatory Visit | Attending: Internal Medicine | Admitting: Internal Medicine

## 2014-06-14 DIAGNOSIS — J189 Pneumonia, unspecified organism: Secondary | ICD-10-CM

## 2014-07-07 ENCOUNTER — Telehealth: Payer: Self-pay | Admitting: Interventional Cardiology

## 2014-07-07 NOTE — Telephone Encounter (Signed)
New message     Pt says her pulse was 174 last night at 11:45pm.  She took 2 nitro and a baby aspirin.  This morning it was 84.  Should she be concerned?

## 2014-07-07 NOTE — Telephone Encounter (Addendum)
Spoke with pt and about 11 pm she woke up from sleeping and she could feel her heart beating in her ears with upper chest pain and tightness/pain in her throat. She took her BP, but she cant remember the reading, HR was 174 bpm (per pts machine). Pt took an extra baby Aspirin and 2 nitro, which may have helped. She took a depression pill and used some of her husbands oxygen, which seemed to help. She eventually went back to sleep. Bp today was 123/64 with HR of 74. She has some chest tightness today. She is not sure if this is cardiac or if she may be getting pneumonia again.   Pt will need cataract surgery on 07/20/14 and 07/30/14. Pt wants to know if she can stay on plavix or if she needs to hold 5 days prior to surgery.

## 2014-07-07 NOTE — Telephone Encounter (Signed)
Per Dr. Eldridge DaceVaranasi pt can wear 30 day heart monitor or she can continue to monitor symptoms and let us know if it happens again.

## 2014-07-08 NOTE — Telephone Encounter (Signed)
Pt states she is feeling fine today. She would like to continue to monitor symptoms and call back if symptoms arise again.Pt will need cataract surgery on 07/20/14 and 07/30/14. Pt wants to know if she can stay on plavix or if she needs to hold 5 days prior to surgery.

## 2014-07-09 NOTE — Telephone Encounter (Signed)
lmtrc

## 2014-07-09 NOTE — Telephone Encounter (Signed)
Pt.notified

## 2014-07-09 NOTE — Telephone Encounter (Signed)
It is up to ophthalmologist.

## 2014-09-09 ENCOUNTER — Other Ambulatory Visit (HOSPITAL_COMMUNITY): Payer: Self-pay | Admitting: Obstetrics and Gynecology

## 2014-09-09 DIAGNOSIS — Z1231 Encounter for screening mammogram for malignant neoplasm of breast: Secondary | ICD-10-CM

## 2014-10-18 ENCOUNTER — Ambulatory Visit (HOSPITAL_COMMUNITY)
Admission: RE | Admit: 2014-10-18 | Discharge: 2014-10-18 | Disposition: A | Discharge status: Home / Self Care | Payer: Self-pay | Source: Ambulatory Visit | Admitting: Radiology

## 2014-10-18 ENCOUNTER — Ambulatory Visit (HOSPITAL_COMMUNITY)
Admission: RE | Admit: 2014-10-18 | Discharge: 2014-10-18 | Disposition: A | Discharge status: Home / Self Care | Payer: Medicare Other | Source: Ambulatory Visit | Attending: Obstetrics and Gynecology | Admitting: Obstetrics and Gynecology

## 2014-10-18 DIAGNOSIS — Z1231 Encounter for screening mammogram for malignant neoplasm of breast: Secondary | ICD-10-CM | POA: Insufficient documentation

## 2014-10-18 DIAGNOSIS — R928 Other abnormal and inconclusive findings on diagnostic imaging of breast: Secondary | ICD-10-CM | POA: Diagnosis not present

## 2014-10-19 ENCOUNTER — Other Ambulatory Visit: Payer: Self-pay | Admitting: Obstetrics and Gynecology

## 2014-10-19 DIAGNOSIS — R928 Other abnormal and inconclusive findings on diagnostic imaging of breast: Secondary | ICD-10-CM

## 2014-11-05 ENCOUNTER — Ambulatory Visit (HOSPITAL_COMMUNITY)
Admission: RE | Admit: 2014-11-05 | Discharge: 2014-11-05 | Disposition: A | Discharge status: Home / Self Care | Payer: Self-pay | Source: Ambulatory Visit | Admitting: Radiology

## 2014-11-05 ENCOUNTER — Ambulatory Visit
Admission: RE | Admit: 2014-11-05 | Discharge: 2014-11-05 | Disposition: A | Discharge status: Home / Self Care | Payer: Medicare Other | Source: Ambulatory Visit | Attending: Obstetrics and Gynecology | Admitting: Obstetrics and Gynecology

## 2014-11-05 DIAGNOSIS — R928 Other abnormal and inconclusive findings on diagnostic imaging of breast: Secondary | ICD-10-CM

## 2014-12-13 ENCOUNTER — Ambulatory Visit (INDEPENDENT_AMBULATORY_CARE_PROVIDER_SITE_OTHER): Payer: Medicare Other | Admitting: Interventional Cardiology

## 2014-12-13 ENCOUNTER — Encounter: Payer: Self-pay | Admitting: Interventional Cardiology

## 2014-12-13 VITALS — BP 122/78 | HR 64 | Ht 64.0 in | Wt 149.0 lb

## 2014-12-13 DIAGNOSIS — R079 Chest pain, unspecified: Secondary | ICD-10-CM | POA: Insufficient documentation

## 2014-12-13 DIAGNOSIS — R0602 Shortness of breath: Secondary | ICD-10-CM

## 2014-12-13 DIAGNOSIS — E782 Mixed hyperlipidemia: Secondary | ICD-10-CM

## 2014-12-13 DIAGNOSIS — E119 Type 2 diabetes mellitus without complications: Secondary | ICD-10-CM

## 2014-12-13 NOTE — Progress Notes (Signed)
Patient ID: Barbara Carroll, female   DOB: July 27, 1941, 74 y.o.   MRN: [id number]    [address] [place], [place]  [address] Phone: [phone] Fax:  [phone]  Date:  12/13/2014   ID:  Barbara Carroll, DOB 03-13-1941, MRN [id number]  PCP:  Georgann Housekeeper, MD      History of Present Illness: Barbara Carroll is a 74 y.o. female  who had a stress test in 2011 which showed TID. Repeat stress in 2014 was negative. She has not  had any chest pressure in the past month.  She  used NTG a few months ago.  She will also chew a baby aspirin.  She can have palpitations too as well.  She has used some NTG and xanax at times when she has symptoms. WHen she cleans house, she does not have symptoms. She has been stressed due to her husband's health.  She has used the NTG several times. She does not feel that her sx are bad enough that she wants to do a cath. She had a light stroke in 2011 and has been started on plavix.   No bleeding problems.  Joint pains limit her, but no surgery planned.  She has had bilateral cataract surgery in 2015.    She is afraid of bed rest in case we had to use a groin approach from cath. She is afraid of having a stroke during the cath. This is why she has avoided this test.   Wt Readings from Last 3 Encounters:  12/13/14 149 lb (67.586 kg)  10/22/13 159 lb (72.122 kg)  10/06/13 165 lb (74.844 kg)     Past Medical History  Diagnosis Date  . Dyslipidemia   . Hypertension   . 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 Back pain   . GERD (gastroesophageal reflux disease)   . Allergic rhinitis   . Osteopenia     bone density 2009,vitamin D level, normal 2009  . H/O actinic keratosis   . Vaginal bleeding     hysterectomy many years ago,seen by/GYN doctor delcambre,abdominal and pelvic ultrasound 2009 negative   . Hx: UTI (urinary tract infection)   . Anxiety   . Family history of colon cancer   . CVA (cerebral infarction)     small right  frontoparietal with left facial numbness and lefthand and foot tingling without weakness  . DJD (degenerative joint disease), cervical     Current Outpatient Prescriptions  Medication Sig Dispense Refill  . ALPRAZolam (XANAX) 0.5 MG tablet Take 0.5 mg by mouth at bedtime as needed for sleep.    Marland Kitchen amLODipine (NORVASC) 10 MG tablet Take 10 mg by mouth daily.    Marland Kitchen aspirin 81 MG EC tablet Take 81 mg by mouth daily. Swallow whole.    . B Complex Vitamins (B-COMPLEX/B-12 PO) Take by mouth.    . cholecalciferol (VITAMIN D) 1000 UNITS tablet Take 1,000 Units by mouth daily.    . clopidogrel (PLAVIX) 75 MG tablet Take 75 mg by mouth daily.    . Coenzyme Q10 (CO Q 10 PO) Take by mouth. 1 tab daily    . fish oil-omega-3 fatty acids 1000 MG capsule Take 2 g by mouth daily.    . lansoprazole (PREVACID) 15 MG capsule Take 15 mg by mouth daily.    Marland Kitchen levothyroxine (SYNTHROID, LEVOTHROID) 112 MCG tablet Take 112 mcg by mouth daily before breakfast.    . lovastatin (MEVACOR) 20 MG tablet Take  20 mg by mouth at bedtime.    . metFORMIN (GLUCOPHAGE) 500 MG tablet Take 500 mg by mouth 2 (two) times daily.  6  . Multiple Vitamin (MULTIVITAMIN) tablet Take 1 tablet by mouth daily.    . valsartan-hydrochlorothiazide (DIOVAN-HCT) 320-25 MG per tablet Take 1 tablet by mouth daily.    . nitroGLYCERIN (NITROSTAT) 0.4 MG SL tablet Place 0.4 mg under the tongue every 5 (five) minutes as needed for chest pain.    Marland Kitchen ondansetron (ZOFRAN) 4 MG tablet Take 4 mg by mouth every 8 (eight) hours as needed.      No current facility-administered medications for this visit.    Allergies:    Allergies  Allergen Reactions  . Pneumovax [Pneumococcal Polysaccharide Vaccine]     Severe local swelling   . Iohexol Hives     Desc: HIVES SEVERAL HRS S/P IV CONTRAST.. [place] LAST SCAN W/ BENADRYL PREMED @ [facility] '01/AC.   Marland Kitchen Ivp Dye [Iodinated Diagnostic Agents]     itching  . Lisinopril     Cough     Social History:  The patient   reports that she has quit smoking. She does not have any smokeless tobacco history on file.   Family History:  The patient's family history includes Colon cancer in her mother; Heart disease in her father; Stroke in her mother.   ROS:  Please see the history of present illness.  No nausea, vomiting.  No fevers, chills.  No focal weakness.  No dysuria.  All other systems reviewed and negative.   PHYSICAL EXAM: VS:  BP 122/78 mmHg  Pulse 64  Ht  (1.626 m)  Wt 149 lb (67.586 kg)  BMI 25.56 kg/m2 Well nourished, well developed, in no acute distress HEENT: normal Neck: no JVD, no carotid bruits Cardiac:  normal S1, S2; RRR;  Lungs:  clear to auscultation bilaterally, no wheezing, rhonchi or rales Abd: soft, nontender, no hepatomegaly Ext: no edema Skin: warm and dry Neuro:   no focal abnormalities noted Psych: normal affect  EKG:  NSR, nonspecific ST segment changes    ASSESSMENT AND PLAN:  Shortness of breath dyspnea / CP Diagnostic Imaging:EKG Harward,Amy 09/08/2012 09:58:56 AM > Ngan Qualls,JAY 09/08/2012 10:38:18 AM > NSR, nonspecific ST segment changes  Still with DOE- unchanged/stable.  Repeat stress test in 2014 was negative. Still with some chest discomfort at times- not related to exertion.  She declined cath in the past.  She is not interested in cath today as well.  continue to try to exercise on a regular basis. This may be more related to lack of stamina. Exercise limited by knee pain.    2. CVA in 2013  Apparently had CVA in March 2013. She wore a monitor in 2013.  No AFib.  Normal LV function.   LDL 140 in 6/14,  She did better with Crestor but it was not covered by insurance.  She did not tolerate lipitor due to muscle pain.  LDL above target given h/o stroke.  Now tolerating lovastatin.  Labs checked by PMD.   3. Diabetes: Now on metformin.  Managed by PMD. Had a fever this sumer and sugar was 500.     Fatigue: she does snore.  Check for sleep apnea sx at home.   Husband has not mentioned that she stops breathing.    Chest pain: relieved with NTG in the past.  Mostly in the upper chest.  She is not interested in cath at this time.  No sx  in the past month.   No triggers that she can think of for he symptoms.  She will let us know if she has any further symptoms or changes her mind about cardiac cath.  Signed, Fredric MareJay S. Vearl Aitken, MD, [facility] 12/13/2014 9:35 AM

## 2014-12-13 NOTE — Patient Instructions (Signed)
Your physician recommends that you continue on your current medications as directed. Please refer to the Current Medication list given to you today.  Your physician wants you to follow-up in: 1 year with Dr. Varanasi. You will receive a reminder letter in the mail two months in advance. If you don't receive a letter, please call our office to schedule the follow-up appointment.  

## 2015-01-25 ENCOUNTER — Ambulatory Visit
Admission: RE | Admit: 2015-01-25 | Discharge: 2015-01-25 | Disposition: A | Discharge status: Home / Self Care | Payer: Medicare Other | Source: Ambulatory Visit | Attending: Internal Medicine | Admitting: Internal Medicine

## 2015-01-25 ENCOUNTER — Other Ambulatory Visit: Payer: Self-pay | Admitting: Internal Medicine

## 2015-01-25 DIAGNOSIS — R0600 Dyspnea, unspecified: Secondary | ICD-10-CM

## 2015-01-26 ENCOUNTER — Other Ambulatory Visit: Payer: Self-pay | Admitting: Internal Medicine

## 2015-01-26 DIAGNOSIS — R0600 Dyspnea, unspecified: Secondary | ICD-10-CM

## 2015-01-28 ENCOUNTER — Other Ambulatory Visit (HOSPITAL_COMMUNITY): Payer: Self-pay | Admitting: Internal Medicine

## 2015-01-28 ENCOUNTER — Ambulatory Visit (HOSPITAL_COMMUNITY)
Admission: RE | Admit: 2015-01-28 | Discharge: 2015-01-28 | Disposition: A | Discharge status: Home / Self Care | Payer: Medicare Other | Source: Ambulatory Visit | Attending: Internal Medicine | Admitting: Internal Medicine

## 2015-01-28 ENCOUNTER — Inpatient Hospital Stay: Admission: RE | Admit: 2015-01-28 | Payer: Medicare Other | Source: Ambulatory Visit

## 2015-01-28 DIAGNOSIS — R0600 Dyspnea, unspecified: Secondary | ICD-10-CM | POA: Diagnosis present

## 2015-01-28 DIAGNOSIS — I2699 Other pulmonary embolism without acute cor pulmonale: Secondary | ICD-10-CM

## 2015-01-28 DIAGNOSIS — R05 Cough: Secondary | ICD-10-CM | POA: Diagnosis not present

## 2015-01-28 DIAGNOSIS — R079 Chest pain, unspecified: Secondary | ICD-10-CM | POA: Insufficient documentation

## 2015-01-28 MED ORDER — TECHNETIUM TC 99M DIETHYLENETRIAME-PENTAACETIC ACID: 40.0000 | Freq: Once | INTRAVENOUS | Status: AC | PRN

## 2015-01-28 MED ORDER — TECHNETIUM TO 99M ALBUMIN AGGREGATED
6.0000 | Freq: Once | INTRAVENOUS | Status: AC | PRN
  Administered 2015-01-28: 6 via INTRAVENOUS

## 2015-10-31 ENCOUNTER — Other Ambulatory Visit: Payer: Self-pay

## 2015-10-31 DIAGNOSIS — Z1231 Encounter for screening mammogram for malignant neoplasm of breast: Secondary | ICD-10-CM

## 2015-11-30 ENCOUNTER — Ambulatory Visit (HOSPITAL_COMMUNITY)
Admission: RE | Admit: 2015-11-30 | Discharge: 2015-11-30 | Disposition: A | Discharge status: Home / Self Care | Payer: Self-pay | Source: Ambulatory Visit | Admitting: Radiology

## 2015-11-30 ENCOUNTER — Ambulatory Visit
Admission: RE | Admit: 2015-11-30 | Discharge: 2015-11-30 | Disposition: A | Discharge status: Home / Self Care | Payer: Medicare Other | Source: Ambulatory Visit

## 2015-11-30 DIAGNOSIS — Z1231 Encounter for screening mammogram for malignant neoplasm of breast: Secondary | ICD-10-CM

## 2015-12-28 ENCOUNTER — Telehealth: Payer: Self-pay | Admitting: Interventional Cardiology

## 2015-12-28 NOTE — Telephone Encounter (Signed)
New message    Request for surgical clearance:  1. What type of surgery is being performed? Right knee replacement   2. When is this surgery scheduled? Pending    3. Are there any medications that need to be held prior to surgery and how long? No / Dr. Eldridge Dace decision on which medicaiton   4. Name of physician performing surgery? Dr.Graves   5. What is your office phone and fax number? [phone] / fax # fax [phone]

## 2015-12-28 NOTE — Telephone Encounter (Signed)
Will route this message to Dr Eldridge Dace for further review and recommendation on medication clearance needed for pts Right knee replacement, and follow-up with Joni Reining at [facility] thereafter.

## 2015-12-28 NOTE — Telephone Encounter (Signed)
Spoke with pt and she states that she is still having the chest discomfort about once every 2-3 weeks. Pt believes it is brought on by stress. Pt states her husband is a [place] vet and suffers from PTSD so sometimes it gets stressful and she has this feeling.  Advised pt that I would make Dr. Eldridge Dace aware and call her back with any recommendations.

## 2015-12-28 NOTE — Telephone Encounter (Signed)
Has she had more chest discomfort? Does  she have an appt scheduled?

## 2015-12-28 NOTE — Telephone Encounter (Signed)
If no exertional chest pain, will clear for surgery given negative stress test in 2014.

## 2015-12-29 NOTE — Telephone Encounter (Signed)
No further cardiac testing needed prior to orthopedic surgery.

## 2015-12-29 NOTE — Telephone Encounter (Signed)
Spoke with pt and she denies on CP on exertion.

## 2015-12-29 NOTE — Telephone Encounter (Signed)
Placed clearance in nurse fax box to be sent to [facility].

## 2016-01-03 ENCOUNTER — Other Ambulatory Visit: Payer: Self-pay | Admitting: Orthopedic Surgery

## 2016-01-11 ENCOUNTER — Encounter (HOSPITAL_COMMUNITY): Payer: Self-pay

## 2016-01-11 ENCOUNTER — Encounter (HOSPITAL_COMMUNITY)
Admission: RE | Admit: 2016-01-11 | Discharge: 2016-01-11 | Disposition: A | Discharge status: Home / Self Care | Payer: Medicare Other | Source: Ambulatory Visit | Attending: Orthopedic Surgery | Admitting: Orthopedic Surgery

## 2016-01-11 DIAGNOSIS — Z91048 Other nonmedicinal substance allergy status: Secondary | ICD-10-CM | POA: Diagnosis not present

## 2016-01-11 DIAGNOSIS — I10 Essential (primary) hypertension: Secondary | ICD-10-CM | POA: Diagnosis not present

## 2016-01-11 DIAGNOSIS — M1711 Unilateral primary osteoarthritis, right knee: Secondary | ICD-10-CM | POA: Diagnosis present

## 2016-01-11 DIAGNOSIS — E118 Type 2 diabetes mellitus with unspecified complications: Secondary | ICD-10-CM | POA: Diagnosis not present

## 2016-01-11 DIAGNOSIS — E890 Postprocedural hypothyroidism: Secondary | ICD-10-CM | POA: Diagnosis not present

## 2016-01-11 DIAGNOSIS — Z87891 Personal history of nicotine dependence: Secondary | ICD-10-CM | POA: Diagnosis not present

## 2016-01-11 HISTORY — DX: Headache, unspecified: R51.9

## 2016-01-11 HISTORY — DX: Reserved for inherently not codable concepts without codable children: IMO0001

## 2016-01-11 HISTORY — DX: Pneumonia, unspecified organism: J18.9

## 2016-01-11 HISTORY — DX: Heart failure, unspecified: I50.9

## 2016-01-11 HISTORY — DX: Nausea with vomiting, unspecified: R11.2

## 2016-01-11 HISTORY — DX: Headache: R51

## 2016-01-11 HISTORY — DX: Other specified postprocedural states: Z98.890

## 2016-01-11 HISTORY — DX: Hypothyroidism, unspecified: E03.9

## 2016-01-11 HISTORY — DX: Family history of other specified conditions: Z84.89

## 2016-01-11 LAB — URINALYSIS, ROUTINE W REFLEX MICROSCOPIC
Bilirubin Urine: NEGATIVE
GLUCOSE, UA: NEGATIVE mg/dL
Ketones, ur: NEGATIVE mg/dL
LEUKOCYTES UA: NEGATIVE
NITRITE: NEGATIVE
PH: 6 (ref 5.0–8.0)
Protein, ur: NEGATIVE mg/dL
SPECIFIC GRAVITY, URINE: 1.005 (ref 1.005–1.030)

## 2016-01-11 LAB — COMPREHENSIVE METABOLIC PANEL
ALT: 22 U/L (ref 14–54)
AST: 22 U/L (ref 15–41)
Albumin: 4.2 g/dL (ref 3.5–5.0)
Alkaline Phosphatase: 55 U/L (ref 38–126)
Anion gap: 12 (ref 5–15)
BILIRUBIN TOTAL: 0.4 mg/dL (ref 0.3–1.2)
BUN: 15 mg/dL (ref 6–20)
CHLORIDE: 101 mmol/L (ref 101–111)
CO2: 27 mmol/L (ref 22–32)
CREATININE: 0.73 mg/dL (ref 0.44–1.00)
Calcium: 10 mg/dL (ref 8.9–10.3)
Glucose, Bld: 109 mg/dL — ABNORMAL HIGH (ref 65–99)
POTASSIUM: 3.8 mmol/L (ref 3.5–5.1)
Sodium: 140 mmol/L (ref 135–145)
TOTAL PROTEIN: 7.4 g/dL (ref 6.5–8.1)

## 2016-01-11 LAB — CBC WITH DIFFERENTIAL/PLATELET
Basophils Absolute: 0 10*3/uL (ref 0.0–0.1)
Basophils Relative: 0 %
EOS PCT: 2 %
Eosinophils Absolute: 0.1 10*3/uL (ref 0.0–0.7)
HEMATOCRIT: 40.4 % (ref 36.0–46.0)
Hemoglobin: 13.2 g/dL (ref 12.0–15.0)
LYMPHS ABS: 3.6 10*3/uL (ref 0.7–4.0)
LYMPHS PCT: 49 %
MCH: 29.1 pg (ref 26.0–34.0)
MCHC: 32.7 g/dL (ref 30.0–36.0)
MCV: 89.2 fL (ref 78.0–100.0)
MONO ABS: 0.5 10*3/uL (ref 0.1–1.0)
MONOS PCT: 7 %
NEUTROS ABS: 3.1 10*3/uL (ref 1.7–7.7)
Neutrophils Relative %: 42 %
PLATELETS: 308 10*3/uL (ref 150–400)
RBC: 4.53 MIL/uL (ref 3.87–5.11)
RDW: 13.9 % (ref 11.5–15.5)
WBC: 7.3 10*3/uL (ref 4.0–10.5)

## 2016-01-11 LAB — SURGICAL PCR SCREEN
MRSA, PCR: NEGATIVE
STAPHYLOCOCCUS AUREUS: NEGATIVE

## 2016-01-11 LAB — TYPE AND SCREEN
ABO/RH(D): A POS
ANTIBODY SCREEN: NEGATIVE

## 2016-01-11 LAB — URINE MICROSCOPIC-ADD ON
BACTERIA UA: NONE SEEN
WBC, UA: NONE SEEN WBC/hpf (ref 0–5)

## 2016-01-11 LAB — APTT: APTT: 26 s (ref 24–37)

## 2016-01-11 LAB — PROTIME-INR
INR: 0.92 (ref 0.00–1.49)
PROTHROMBIN TIME: 12.6 s (ref 11.6–15.2)

## 2016-01-11 LAB — ABO/RH: ABO/RH(D): A POS

## 2016-01-11 LAB — GLUCOSE, CAPILLARY: GLUCOSE-CAPILLARY: 139 mg/dL — AB (ref 65–99)

## 2016-01-11 NOTE — Pre-Procedure Instructions (Signed)
Barbara Carroll  01/11/2016      [id number] - [place], [place] - [name] [address] Phone: [phone] Fax: [phone]    Your procedure is scheduled on Feb 10  Report to [facility] Admitting at 530 A.M.  Call this number if you have problems the morning of surgery:  [phone]   Remember:  Do not eat food or drink liquids after midnight.  Take these medicines the morning of surgery with A SIP OF WATER amlodipine (Norvasc), lansoprazole (Prevacid), Levothyroxine (Synthroid)   Stop Plavix as directed by your Dr.  Stop taking aspirin, Ibuprofen, Aleve, Advil, Motrin, BC's, Goody's, Herbal medications, Vitamins, Fish Oil How to Manage Your Diabetes Before Surgery   Why is it important to control my blood sugar before and after surgery?   Improving blood sugar levels before and after surgery helps healing and can limit problems.  A way of improving blood sugar control is eating a healthy diet by:  - Eating less sugar and carbohydrates  - Increasing activity/exercise  - Talk with your doctor about reaching your blood sugar goals  High blood sugars (greater than 180 mg/dL) can raise your risk of infections and slow down your recovery so you will need to focus on controlling your diabetes during the weeks before surgery.  Make sure that the doctor who takes care of your diabetes knows about your planned surgery including the date and location.  How do I manage my blood sugars before surgery?   Check your blood sugar at least 4 times a day, 2 days before surgery to make sure that they are not too high or low.   Check your blood sugar the morning of your surgery when you wake up and every 2               hours until you get to the Short-Stay unit.  If your blood sugar is less than 70 mg/dL, you will need to treat for low blood sugar by:  Treat a low blood sugar (less than 70 mg/dL) with 1/2 cup of clear juice (cranberry  or apple), 4 glucose tablets, OR glucose gel.  Recheck blood sugar in 15 minutes after treatment (to make sure it is greater than 70 mg/dL).  If blood sugar is not greater than 70 mg/dL on re-check, call [phone] for further instructions.   Report your blood sugar to the Short-Stay nurse when you get to Short-Stay.  References:  [facility], 2007 "How to Manage your Diabetes Before and After Surgery".  What do I do about my diabetes medications?   Do not take oral diabetes medicines (pills) the morning of surgery.   Do not take other diabetes injectables the day of surgery including Byetta, Victoza, Bydureon, and Trulicity.    If your CBG is greater than 220 mg/dL, you may take 1/2 of your sliding scale (correction) dose of insulin.     Do not wear jewelry, make-up or nail polish.  Do not wear lotions, powders, or perfumes.  You may wear deodorant.  Do not shave 48 hours prior to surgery.  Men may shave face and neck.  Do not bring valuables to the hospital.  [facility] is not responsible for any belongings or valuables.  Contacts, dentures or bridgework may not be worn into surgery.  Leave your suitcase in the car.  After surgery it may be brought to your room.  For patients admitted  to the hospital, discharge time will be determined by your treatment team.  Patients discharged the day of surgery will not be allowed to drive home.    Special instructions:  [facility] - Preparing for Surgery  Before surgery, you can play an important role.  Because skin is not sterile, your skin needs to be as free of germs as possible.  You can reduce the number of germs on you skin by washing with CHG (chlorahexidine gluconate) soap before surgery.  CHG is an antiseptic cleaner which kills germs and bonds with the skin to continue killing germs even after washing.  Please DO NOT use if you have an allergy to CHG or antibacterial soaps.  If your skin becomes  reddened/irritated stop using the CHG and inform your nurse when you arrive at Short Stay.  Do not shave (including legs and underarms) for at least 48 hours prior to the first CHG shower.  You may shave your face.  Please follow these instructions carefully:   1.  Shower with CHG Soap the night before surgery and the   morning of Surgery.  2.  If you choose to wash your hair, wash your hair first as usual with your   normal shampoo.  3.  After you shampoo, rinse your hair and body thoroughly to remove the  Shampoo.  4.  Use CHG as you would any other liquid soap.  You can apply chg directly  to the skin and wash gently with scrungie or a clean washcloth.  5.  Apply the CHG Soap to your body ONLY FROM THE NECK DOWN.   Do not use on open wounds or open sores.  Avoid contact with your eyes,  ears, mouth and genitals (private parts).  Wash genitals (private parts)  with your normal soap.  6.  Wash thoroughly, paying special attention to the area where your surgery will be performed.  7.  Thoroughly rinse your body with warm water from the neck down.  8.  DO NOT shower/wash with your normal soap after using and rinsing off  the CHG Soap.  9.  Pat yourself dry with a clean towel.            10.  Wear clean pajamas.            11.  Place clean sheets on your bed the night of your first shower and do not  sleep with pets.  Day of Surgery  Do not apply any lotions/deoderants the morning of surgery.  Please wear clean clothes to the hospital/surgery center.     Please read over the following fact sheets that you were given. Pain Booklet, Coughing and Deep Breathing, Blood Transfusion Information, MRSA Information and Surgical Site Infection Prevention

## 2016-01-11 NOTE — Progress Notes (Addendum)
PCp is Dr Jerelyn Scott husain Cardiologist is Dr Eldridge Dace- she is due to see him again in March 2017 Reports she does not usually check her CBG's, but when she does they run around 130's Reports she stopped taking Plavix 01-05-16 Denies ever having a card cath Stress test noted 10-23-2013 Echo noted 09-15-12

## 2016-01-11 NOTE — Progress Notes (Signed)
See note from 12-28-15 Message left with Cherre Robins  At Dr Luiz Blare office to fax over cardiac clearance to [place].

## 2016-01-12 ENCOUNTER — Inpatient Hospital Stay (HOSPITAL_COMMUNITY): Payer: Medicare Other | Admitting: Vascular Surgery

## 2016-01-12 ENCOUNTER — Inpatient Hospital Stay (HOSPITAL_COMMUNITY): Payer: Medicare Other | Admitting: Certified Registered Nurse Anesthetist

## 2016-01-12 LAB — HEMOGLOBIN A1C
HEMOGLOBIN A1C: 6.5 % — AB (ref 4.8–5.6)
MEAN PLASMA GLUCOSE: 140 mg/dL

## 2016-01-12 MED ORDER — DEXTROSE 5 % IV SOLN
3.0000 g | INTRAVENOUS | Status: DC
  Filled 2016-01-12 (×2): qty 3000

## 2016-01-12 MED ORDER — CHLORHEXIDINE GLUCONATE 4 % EX LIQD: 60.0000 mL | Freq: Once | CUTANEOUS | Status: DC

## 2016-01-12 NOTE — H&P (Signed)
PREOPERATIVE H&P  Chief Complaint: severe and unrelenting right knee pain  HPI: Barbara Carroll is a 75 y.o. female who presents for evaluation of severe and unrelenting right knee pain. It has been present for several years and has been worsening.the patient has failed physical therapy, activity modification, injection therapy of cortisone and Visco supplementation.  She has light activity pain and significant night pain.  She has had x-rays showing severe bone-on-bone change in the medial compartment of the right knee. She has failed conservative measures. Pain is rated as severe.  Past Medical History  Diagnosis Date  . Dyslipidemia   . Hypertension   . 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 Back pain   . GERD (gastroesophageal reflux disease)   . Allergic rhinitis   . Osteopenia     bone density 2009,vitamin D level, normal 2009  . H/O actinic keratosis   . Vaginal bleeding     hysterectomy many years ago,seen by/GYN doctor [place]re,abdominal and pelvic ultrasound 2009 negative   . Hx: UTI (urinary tract infection)   . Anxiety   . Family history of colon cancer   . CVA (cerebral infarction)     small right frontoparietal with left facial numbness and lefthand and foot tingling without weakness  . DJD (degenerative joint disease), cervical   . PONV (postoperative nausea and vomiting)   . Family history of adverse reaction to anesthesia     mother diff to wake up  . Stroke ([place])   . CHF (congestive heart failure) ([place])   . Shortness of breath dyspnea   . Pneumonia   . Diabetes mellitus without complication ([place])   . Hypothyroidism   . Kidney stones   . Headache    Past Surgical History  Procedure Laterality Date  . Abdominal hysterectomy    . Cholecystectomy    . Colonoscopy    . Eye surgery Bilateral   . Nose surgery     Social History   Social History  . Marital Status: Married    Spouse Name: N/A  . Number of Children: N/A  . Years of Education:  N/A   Social History Main Topics  . Smoking status: Former Research scientist (life sciences)  . Smokeless tobacco: Not on file  . Alcohol Use: No  . Drug Use: No  . Sexual Activity: Not on file   Other Topics Concern  . Not on file   Social History Narrative   Family History  Problem Relation Age of Onset  . Stroke Mother   . Colon cancer Mother   . Heart disease Father    Allergies  Allergen Reactions  . Pneumovax [Pneumococcal Polysaccharide Vaccine]     Severe local swelling   . Iohexol Hives     Desc: HIVES SEVERAL HRS S/P IV CONTRAST.. [place] LAST SCAN W/ BENADRYL PREMED @ [facility] '01/AC.   Marland Kitchen Lisinopril Cough    Cough   . Other Rash    Nickle  When teeth were wired in she place, patient obtained an infection and wiring had to be removed, Also allergic to silver  . Ivp Dye [Iodinated Diagnostic Agents] Itching   Prior to Admission medications   Medication Sig Start Date End Date Taking? Authorizing Provider  ALPRAZolam Duanne Moron) 0.5 MG tablet Take 0.5 mg by mouth at bedtime as needed for sleep.   Yes Historical Provider, MD  amLODipine (NORVASC) 10 MG tablet Take 10 mg by mouth daily.   Yes Historical Provider, MD  aspirin 81 MG  EC tablet Take 81 mg by mouth daily. Swallow whole.   Yes Historical Provider, MD  B Complex Vitamins (B-COMPLEX/B-12 PO) Take 1 tablet by mouth daily.    Yes Historical Provider, MD  Calcium Citrate-Vitamin D (CALCIUM + D PO) Take 1 tablet by mouth daily.   Yes Historical Provider, MD  cholecalciferol (VITAMIN D) 1000 UNITS tablet Take 2,000 Units by mouth daily.    Yes Historical Provider, MD  clopidogrel (PLAVIX) 75 MG tablet Take 75 mg by mouth daily.   Yes Historical Provider, MD  Coenzyme Q10 (CO Q 10 PO) Take 1 capsule by mouth daily. 1 tab daily   Yes Historical Provider, MD  fish oil-omega-3 fatty acids 1000 MG capsule Take 2 g by mouth daily.   Yes Historical Provider, MD  lansoprazole (PREVACID) 15 MG capsule Take 15-30 mg by mouth daily.    Yes Historical Provider,  MD  levothyroxine (SYNTHROID, LEVOTHROID) 100 MCG tablet Take 100 mcg by mouth daily before breakfast.   Yes Historical Provider, MD  lovastatin (MEVACOR) 20 MG tablet Take 20 mg by mouth at bedtime.   Yes Historical Provider, MD  metFORMIN (GLUCOPHAGE) 500 MG tablet Take 500 mg by mouth 2 (two) times daily. 11/23/14  Yes Historical Provider, MD  Multiple Vitamin (MULTIVITAMIN) tablet Take 1 tablet by mouth daily.   Yes Historical Provider, MD  nitroGLYCERIN (NITROSTAT) 0.4 MG SL tablet Place 0.4 mg under the tongue every 5 (five) minutes as needed for chest pain.   Yes Historical Provider, MD  valsartan-hydrochlorothiazide (DIOVAN-HCT) 320-25 MG per tablet Take 1 tablet by mouth daily. 09/18/13  Yes Historical Provider, MD  Potassium 99 MG TABS Take 99 mg by mouth daily.    Historical Provider, MD     Positive ROS: none  All other systems have been reviewed and were otherwise negative with the exception of those mentioned in the HPI and as above.  Physical Exam: There were no vitals filed for this visit.  General: Alert, no acute distress Cardiovascular: No pedal edema Respiratory: No cyanosis, no use of accessory musculature GI: No organomegaly, abdomen is soft and non-tender Skin: No lesions in the area of chief complaint Neurologic: Sensation intact distally Psychiatric: Patient is competent for consent with normal mood and affect Lymphatic: No axillary or cervical lymphadenopathy  MUSCULOSKELETAL: right knee: Limited range of motion.  Trace effusion.  No instability.  X-ray: 4 views right knee are interpreted in show severe bone-on-bone change in the medial compartment.  Chest x-ray showed no narrowing in the lateral compartment.  Patellofemoral joint shows no evidence of change.  Recent Results (from the past 2160 hour(s))  Glucose, capillary     Status: Abnormal   Collection Time: 01/11/16 10:51 AM  Result Value Ref Range   Glucose-Capillary 139 (H) 65 - 99 mg/dL   Urinalysis, Routine w reflex microscopic (not at [facility])     Status: Abnormal   Collection Time: 01/11/16 11:19 AM  Result Value Ref Range   Color, Urine YELLOW YELLOW   APPearance CLEAR CLEAR   Specific Gravity, Urine 1.005 1.005 - 1.030   pH 6.0 5.0 - 8.0   Glucose, UA NEGATIVE NEGATIVE mg/dL   Hgb urine dipstick TRACE (A) NEGATIVE   Bilirubin Urine NEGATIVE NEGATIVE   Ketones, ur NEGATIVE NEGATIVE mg/dL   Protein, ur NEGATIVE NEGATIVE mg/dL   Nitrite NEGATIVE NEGATIVE   Leukocytes, UA NEGATIVE NEGATIVE  Surgical pcr screen     Status: None   Collection Time: 01/11/16 11:19 AM  Result Value Ref Range   MRSA, PCR NEGATIVE NEGATIVE   Staphylococcus aureus NEGATIVE NEGATIVE    Comment:        The Xpert SA Assay (FDA approved for NASAL specimens in patients over 55 years of age), is one component of a comprehensive surveillance program.  Test performance has been validated by [facility] for patients greater than or equal to 78 year old. It is not intended to diagnose infection nor to guide or monitor treatment.   Urine microscopic-add on     Status: Abnormal   Collection Time: 01/11/16 11:19 AM  Result Value Ref Range   Squamous Epithelial / LPF 0-5 (A) NONE SEEN   WBC, UA NONE SEEN 0 - 5 WBC/hpf   RBC / HPF 0-5 0 - 5 RBC/hpf   Bacteria, UA NONE SEEN NONE SEEN  APTT     Status: None   Collection Time: 01/11/16 11:20 AM  Result Value Ref Range   aPTT 26 24 - 37 seconds  CBC WITH DIFFERENTIAL     Status: None   Collection Time: 01/11/16 11:20 AM  Result Value Ref Range   WBC 7.3 4.0 - 10.5 K/uL   RBC 4.53 3.87 - 5.11 MIL/uL   Hemoglobin 13.2 12.0 - 15.0 g/dL   HCT 40.4 36.0 - 46.0 %   MCV 89.2 78.0 - 100.0 fL   MCH 29.1 26.0 - 34.0 pg   MCHC 32.7 30.0 - 36.0 g/dL   RDW 13.9 11.5 - 15.5 %   Platelets 308 150 - 400 K/uL   Neutrophils Relative % 42 %   Neutro Abs 3.1 1.7 - 7.7 K/uL   Lymphocytes Relative 49 %   Lymphs Abs 3.6 0.7 - 4.0 K/uL   Monocytes Relative 7  %   Monocytes Absolute 0.5 0.1 - 1.0 K/uL   Eosinophils Relative 2 %   Eosinophils Absolute 0.1 0.0 - 0.7 K/uL   Basophils Relative 0 %   Basophils Absolute 0.0 0.0 - 0.1 K/uL  Comprehensive metabolic panel     Status: Abnormal   Collection Time: 01/11/16 11:20 AM  Result Value Ref Range   Sodium 140 135 - 145 mmol/L   Potassium 3.8 3.5 - 5.1 mmol/L   Chloride 101 101 - 111 mmol/L   CO2 27 22 - 32 mmol/L   Glucose, Bld 109 (H) 65 - 99 mg/dL   BUN 15 6 - 20 mg/dL   Creatinine, Ser 0.73 0.44 - 1.00 mg/dL   Calcium 10.0 8.9 - 10.3 mg/dL   Total Protein 7.4 6.5 - 8.1 g/dL   Albumin 4.2 3.5 - 5.0 g/dL   AST 22 15 - 41 U/L   ALT 22 14 - 54 U/L   Alkaline Phosphatase 55 38 - 126 U/L   Total Bilirubin 0.4 0.3 - 1.2 mg/dL   GFR calc non Af Amer >60 >60 mL/min   GFR calc Af Amer >60 >60 mL/min    Comment: (NOTE) The eGFR has been calculated using the CKD EPI equation. This calculation has not been validated in all clinical situations. eGFR's persistently <60 mL/min signify possible Chronic Kidney Disease.    Anion gap 12 5 - 15  Protime-INR     Status: None   Collection Time: 01/11/16 11:20 AM  Result Value Ref Range   Prothrombin Time 12.6 11.6 - 15.2 seconds   INR 0.92 0.00 - 1.49  Hemoglobin A1c     Status: Abnormal   Collection Time: 01/11/16 11:20 AM  Result Value  Ref Range   Hgb A1c MFr Bld 6.5 (H) 4.8 - 5.6 %    Comment: (NOTE)         Pre-diabetes: 5.7 - 6.4         Diabetes: >6.4         Glycemic control for adults with diabetes: <7.0    Mean Plasma Glucose 140 mg/dL    Comment: (NOTE) Performed At: [facility] [place], [place] [id number] Lindon Romp MD [id number]   Type and screen Order type and screen if day of surgery is less than 15 days from draw of preadmission visit or order morning of surgery if day of surgery is greater than 6 days from preadmission visit.     Status: None   Collection Time: 01/11/16 11:37 AM  Result Value  Ref Range   ABO/RH(D) A POS    Antibody Screen NEG    Sample Expiration 01/25/2016    Extend sample reason NO TRANSFUSIONS OR PREGNANCY IN THE PAST 3 MONTHS   ABO/Rh     Status: None   Collection Time: 01/11/16 11:37 AM  Result Value Ref Range   ABO/RH(D) A POS    Assessment/Plan: Osteoarthritis right knee Plan for Procedure(s): UNICOMPARTMENTAL KNEE  The risks benefits and alternatives were discussed with the patient including but not limited to the risks of nonoperative treatment, versus surgical intervention including infection, bleeding, nerve injury, malunion, nonunion, hardware prominence, hardware failure, need for hardware removal, blood clots, cardiopulmonary complications, morbidity, mortality, among others, and they were willing to proceed.  Predicted outcome is good, although there will be at least a six to nine month expected recovery.  Alta Corning, MD 01/12/2016 10:57 PM

## 2016-01-12 NOTE — Progress Notes (Signed)
Anesthesia Chart Review: Patient is a 75 year old female scheduled for right TKA on 01/13/16 by Dr. Luiz Blare.   History includes former smoker, post-operative N/V, dyslipidemia, HTN, thyroidectomy with secondary hypothyroidism, GERD, anxiety, CVA '11, CHF, DM2, cholecystectomy, hysterectomy, nose surgery.  PCP is Dr. Georgann Housekeeper.  Cardiologist is Dr. Eldridge Dace. He wrote on 12/29/15, "No further cardiac testing needed prior to orthopedic surgery."   Meds include Xanax, amlodipine, ASA (on hold), Plavix, fish oil, Prevacid, levothyroxine, lovastatin, metformin, Nitro, potassium, Diovan-HCT. She held Plavix starting 01/05/16.   01/11/16 EKG: NSR, non-specific ST/T wave abnormality.  10/22/13 Nuclear stress test: Overall Impression: No evidence of ischemia.LV Ejection Fraction: 74%. LV Wall Motion: NL LV Function; NL Wall Motion.  09/15/12 Echo: Conclusions: LVEF 60-65%. Borderline LAE. Trace MR. Trivial TR. Mildly elevated RVSP. Trace PR. Grade 1 diastolic dysfunction without elevated LA pressure.  12/18/10 Carotid U/S: IMPRESSION: Bilateral carotid atherosclerosis. No hemodynamically significant ICA stenosis on either side by ultrasound.  01/28/15 CXR: IMPRESSION: No acute abnormality is noted.  Preoperative labs noted. A1c 6.5.   If no acute changes then I anticipate that she could proceed as planned.  Velna Ochs [facility] Short Stay Center/Anesthesiology Phone [phone] 01/12/2016 10:58 AM

## 2016-01-12 NOTE — Progress Notes (Signed)
Barbara Carroll called and reported that she is also allergic to [facility] and Silver.  I added to Allergy list and called and spoke with Joni Reining in Dr Luiz Blare office.

## 2016-01-13 ENCOUNTER — Encounter (HOSPITAL_COMMUNITY): Payer: Self-pay | Admitting: *Deleted

## 2016-01-13 ENCOUNTER — Encounter (HOSPITAL_COMMUNITY)
Admission: RE | Disposition: A | Discharge status: Home / Self Care | Payer: Self-pay | Source: Ambulatory Visit | Attending: Orthopedic Surgery

## 2016-01-13 ENCOUNTER — Ambulatory Visit (HOSPITAL_COMMUNITY)
Admission: RE | Admit: 2016-01-13 | Discharge: 2016-01-13 | DRG: 554 | Disposition: A | Discharge status: Home / Self Care | Payer: Medicare Other | Source: Ambulatory Visit | Attending: Orthopedic Surgery | Admitting: Orthopedic Surgery

## 2016-01-13 DIAGNOSIS — Z91048 Other nonmedicinal substance allergy status: Secondary | ICD-10-CM

## 2016-01-13 DIAGNOSIS — E118 Type 2 diabetes mellitus with unspecified complications: Secondary | ICD-10-CM | POA: Diagnosis present

## 2016-01-13 DIAGNOSIS — M1711 Unilateral primary osteoarthritis, right knee: Secondary | ICD-10-CM | POA: Diagnosis not present

## 2016-01-13 DIAGNOSIS — E890 Postprocedural hypothyroidism: Secondary | ICD-10-CM | POA: Diagnosis present

## 2016-01-13 DIAGNOSIS — I10 Essential (primary) hypertension: Secondary | ICD-10-CM | POA: Diagnosis present

## 2016-01-13 DIAGNOSIS — Z87891 Personal history of nicotine dependence: Secondary | ICD-10-CM

## 2016-01-13 LAB — GLUCOSE, CAPILLARY: GLUCOSE-CAPILLARY: 114 mg/dL — AB (ref 65–99)

## 2016-01-13 SURGERY — ARTHROPLASTY, KNEE, UNICOMPARTMENTAL
Anesthesia: Spinal | Laterality: Right

## 2016-01-13 MED ORDER — PROPOFOL 10 MG/ML IV BOLUS
INTRAVENOUS | Status: AC
  Filled 2016-01-13: qty 20

## 2016-01-13 MED ORDER — ARTIFICIAL TEARS OP OINT
TOPICAL_OINTMENT | OPHTHALMIC | Status: AC
  Filled 2016-01-13: qty 3.5

## 2016-01-13 MED ORDER — ONDANSETRON HCL 4 MG/2ML IJ SOLN
INTRAMUSCULAR | Status: AC
  Filled 2016-01-13: qty 2

## 2016-01-13 MED ORDER — FENTANYL CITRATE (PF) 250 MCG/5ML IJ SOLN
INTRAMUSCULAR | Status: AC
  Filled 2016-01-13: qty 5

## 2016-01-13 MED ORDER — MIDAZOLAM HCL 2 MG/2ML IJ SOLN
INTRAMUSCULAR | Status: AC
  Filled 2016-01-13: qty 2

## 2016-01-13 NOTE — Progress Notes (Signed)
Pt 's surgery cancelled due to nickel allergy.  IV D/C'd from left forearm.  Catheter intact and site unremarkable.

## 2016-01-13 NOTE — Progress Notes (Addendum)
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  Although i am not sure based on the history that this was the cause of her problem, i think i an elective situation that we should skin test her as well as looking at other options.  We offered her knee scope today but she was actuaslly feling fairly well with her cortisone injection at this moment so we will hold off on that as well. She is cancelled for today and we will reschedule.

## 2016-01-13 NOTE — Anesthesia Preprocedure Evaluation (Deleted)
Anesthesia Evaluation  Patient identified by MRN, date of birth, ID band Patient awake    History of Anesthesia Complications (+) PONV  Airway Mallampati: I  TM Distance: >3 FB Neck ROM: Full    Dental   Pulmonary shortness of breath, pneumonia, former smoker,    breath sounds clear to auscultation       Cardiovascular hypertension, +CHF   Rhythm:Regular Rate:Normal     Neuro/Psych    GI/Hepatic GERD  ,  Endo/Other  diabetesHypothyroidism   Renal/GU Renal disease     Musculoskeletal   Abdominal   Peds  Hematology   Anesthesia Other Findings   Reproductive/Obstetrics                             Anesthesia Physical Anesthesia Plan  ASA: III  Anesthesia Plan: Spinal   Post-op Pain Management:    Induction: Intravenous  Airway Management Planned: Simple Face Mask  Additional Equipment:   Intra-op Plan:   Post-operative Plan:   Informed Consent: I have reviewed the patients History and Physical, chart, labs and discussed the procedure including the risks, benefits and alternatives for the proposed anesthesia with the patient or authorized representative who has indicated his/her understanding and acceptance.   Dental advisory given  Plan Discussed with: CRNA and Anesthesiologist  Anesthesia Plan Comments:         Anesthesia Quick Evaluation

## 2016-01-16 ENCOUNTER — Ambulatory Visit
Admission: RE | Admit: 2016-01-16 | Discharge: 2016-01-16 | Disposition: A | Discharge status: Home / Self Care | Payer: Medicare Other | Source: Ambulatory Visit | Attending: Internal Medicine | Admitting: Internal Medicine

## 2016-01-16 ENCOUNTER — Other Ambulatory Visit: Payer: Self-pay | Admitting: Internal Medicine

## 2016-01-16 DIAGNOSIS — R0781 Pleurodynia: Secondary | ICD-10-CM

## 2016-02-14 NOTE — Progress Notes (Signed)
Patient ID: Barbara Carroll, female   DOB: 01-Nov-1941, 75 y.o.   MRN: [id number]     Cardiology Office Note   Date:  02/15/2016   ID:  Barbara Carroll, DOB May 21, 1941, MRN [id number]  PCP:  Barbara Housekeeper, MD    No chief complaint on file. prior abnormal stress test   Wt Readings from Last 3 Encounters:  02/15/16 162 lb (73.483 kg)  01/13/16 161 lb 8 oz (73.256 kg)  01/11/16 161 lb 8 oz (73.256 kg)       History of Present Illness: Barbara Carroll is a 75 y.o. female   who had a stress test in 2011 which showed TID. Repeat stress in 2014 was negative.  She has had anginal sx once a month.  She will also chew a baby aspirin, use NTG and use her husbands oxygen. She can have palpitations too as well. She has used some xanax at times when she has symptoms.   WHen she cleans house, she does not have symptoms. She has been stressed due to her husband's health.  He has many issues apparently related to agent orange.  She has used the NTG several times. She does not feel that her sx are bad enough that she wants to do a cath. She had a light stroke in 2011 and has been started on plavix.   No bleeding problems. Joint pains limit her, but no surgery planned.They tried a procedure but due to a nickel allergy, she was not a candidate. She has had bilateral cataract surgery in 2015Without issues .    She is afraid of bed rest in case we had to use a groin approach from cath. She is afraid of having a stroke during the cath. This is why she has avoided this test.  I have explained to her several times that the radial approach would be preferred.            Past Medical History  Diagnosis Date  . Dyslipidemia   . Hypertension   . 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 Back pain   . GERD (gastroesophageal reflux disease)   . Allergic rhinitis   . Osteopenia     bone density 2009,vitamin D level, normal 2009  . H/O actinic keratosis   . Vaginal bleeding     hysterectomy  many years ago,seen by/GYN doctor delcambre,abdominal and pelvic ultrasound 2009 negative   . Hx: UTI (urinary tract infection)   . Anxiety   . Family history of colon cancer   . CVA (cerebral infarction)     small right frontoparietal with left facial numbness and lefthand and foot tingling without weakness  . DJD (degenerative joint disease), cervical   . PONV (postoperative nausea and vomiting)   . Family history of adverse reaction to anesthesia     mother diff to wake up  . Stroke (HCC)   . CHF (congestive heart failure) (HCC)   . Shortness of breath dyspnea   . Pneumonia   . Diabetes mellitus without complication (HCC)   . Hypothyroidism   . Kidney stones   . Headache     Past Surgical History  Procedure Laterality Date  . Abdominal hysterectomy    . Cholecystectomy    . Colonoscopy    . Eye surgery Bilateral   . Nose surgery       Current Outpatient Prescriptions  Medication Sig Dispense Refill  . ALPRAZolam (XANAX) 0.5 MG tablet Take 0.5 mg by mouth at bedtime  as needed for sleep.    Marland Kitchen amLODipine (NORVASC) 10 MG tablet Take 10 mg by mouth daily.    Marland Kitchen aspirin 81 MG EC tablet Take 81 mg by mouth daily. Swallow whole.    . B Complex Vitamins (B-COMPLEX/B-12 PO) Take 1 tablet by mouth daily.     . Calcium Citrate-Vitamin D (CALCIUM + D PO) Take 1 tablet by mouth daily.    . cholecalciferol (VITAMIN D) 1000 UNITS tablet Take 2,000 Units by mouth daily.     . clopidogrel (PLAVIX) 75 MG tablet Take 75 mg by mouth daily.    . Coenzyme Q10 (CO Q 10 PO) Take 1 capsule by mouth daily. 1 tab daily    . fish oil-omega-3 fatty acids 1000 MG capsule Take 2 g by mouth daily.    . lansoprazole (PREVACID) 15 MG capsule Take 15-30 mg by mouth daily.     Marland Kitchen levothyroxine (SYNTHROID, LEVOTHROID) 100 MCG tablet Take 100 mcg by mouth daily before breakfast.    . lovastatin (MEVACOR) 20 MG tablet Take 20 mg by mouth at bedtime.    . metFORMIN (GLUCOPHAGE) 500 MG tablet Take 500 mg by mouth  2 (two) times daily.  6  . Multiple Vitamin (MULTIVITAMIN) tablet Take 1 tablet by mouth daily.    . nitroGLYCERIN (NITROSTAT) 0.4 MG SL tablet Place 0.4 mg under the tongue every 5 (five) minutes as needed for chest pain.    Marland Kitchen Potassium 99 MG TABS Take 99 mg by mouth daily.    . valsartan-hydrochlorothiazide (DIOVAN-HCT) 320-25 MG per tablet Take 1 tablet by mouth daily.     No current facility-administered medications for this visit.    Allergies:   Pneumovax; Iohexol; Lisinopril; Other; and Ivp dye    Social History:  The patient  reports that she has quit smoking. She does not have any smokeless tobacco history on file. She reports that she does not drink alcohol or use illicit drugs.   Family History:  The patient's family history includes Colon cancer in her mother; Heart disease in her father; Stroke in her mother.    ROS:  Please see the history of present illness.   Otherwise, review of systems are positive for chest pain, knee pain.   All other systems are reviewed and negative.    PHYSICAL EXAM: VS:  BP 130/64 mmHg  Pulse 80  Ht 5' 4.5" (1.638 m)  Wt 162 lb (73.483 kg)  BMI 27.39 kg/m2 , BMI Body mass index is 27.39 kg/(m^2). GEN: Well nourished, well developed, in no acute distress HEENT: normal Neck: no JVD, carotid bruits, or masses Cardiac: RRR; no murmurs, rubs, or gallops,no edema  Respiratory:  clear to auscultation bilaterally, normal work of breathing GI: soft, nontender, nondistended, + BS MS: no deformity or atrophy Skin: warm and dry, no rash Neuro:  Strength and sensation are intact Psych: euthymic mood, full affect   EKG:   The ekg ordered in 2/17 demonstrates NSR, NSST   Recent Labs: 01/11/2016: ALT 22; BUN 15; Creatinine, Ser 0.73; Hemoglobin 13.2; Platelets 308; Potassium 3.8; Sodium 140   Lipid Panel No results found for: CHOL, TRIG, HDL, CHOLHDL, VLDL, LDLCALC, LDLDIRECT   Other studies Reviewed: Additional studies/ records that were  reviewed today with results demonstrating: stress tests reviewed.   ASSESSMENT AND PLAN:  1. Chest pain/SHOB: CP with some features concerning for angina.  Both parents had heart issues.  She is still not interested in having a cath done.  I think  a cath would be advisable, but she is scared of the procedure.  I answered many questions about the procedure.  She will let us know if she changes her mind. 2. Diabetes:  Managed by PMD.   3. Abnormal stress test: TID in 2011.  2014, repeat stress was normal. However, she continues to have some anginal type symptoms. 4. Fatigue: I have asked the family to watch for sx of OSA in the past. She does snore loudly.  She is not interested in a sleep study at this time.  5. Hyperlipidemia: COntinue Mevacor.   Current medicines are reviewed at length with the patient today.  The patient concerns regarding her medicines were addressed.  The following changes have been made:  No change  Labs/ tests ordered today include:  No orders of the defined types were placed in this encounter.    Recommend 150 minutes/week of aerobic exercise Low fat, low carb, high fiber diet recommended  Disposition:   FU in 1 year   Barbara JacksonSigned, Calton Harshfield S., MD  02/15/2016 9:18 AM    [facility] [address], [place], [place]  [address] Phone: [phone]; Fax: [phone]

## 2016-02-15 ENCOUNTER — Ambulatory Visit (INDEPENDENT_AMBULATORY_CARE_PROVIDER_SITE_OTHER): Payer: Medicare Other | Admitting: Interventional Cardiology

## 2016-02-15 ENCOUNTER — Encounter: Payer: Self-pay | Admitting: Interventional Cardiology

## 2016-02-15 VITALS — BP 130/64 | HR 80 | Ht 64.5 in | Wt 162.0 lb

## 2016-02-15 DIAGNOSIS — R9430 Abnormal result of cardiovascular function study, unspecified: Secondary | ICD-10-CM

## 2016-02-15 DIAGNOSIS — R072 Precordial pain: Secondary | ICD-10-CM

## 2016-02-15 DIAGNOSIS — E119 Type 2 diabetes mellitus without complications: Secondary | ICD-10-CM | POA: Diagnosis not present

## 2016-02-15 DIAGNOSIS — E782 Mixed hyperlipidemia: Secondary | ICD-10-CM

## 2016-02-15 MED ORDER — NITROGLYCERIN 0.4 MG SL SUBL: 0.4000 mg | SUBLINGUAL_TABLET | SUBLINGUAL | Status: DC | PRN

## 2016-02-15 NOTE — Patient Instructions (Signed)
**Note De-identified Barbara Carroll Obfuscation** Medication Instructions:  Same-no changes  Labwork: None  Testing/Procedures: None  Follow-Up: Your physician wants you to follow-up in: 1 year. You will receive a reminder letter in the mail two months in advance. If you don't receive a letter, please call our office to schedule the follow-up appointment.      If you need a refill on your cardiac medications before your next appointment, please call your pharmacy.   

## 2016-08-14 ENCOUNTER — Encounter: Payer: Self-pay | Admitting: Interventional Cardiology

## 2016-08-15 ENCOUNTER — Ambulatory Visit (INDEPENDENT_AMBULATORY_CARE_PROVIDER_SITE_OTHER): Payer: Medicare Other | Admitting: Interventional Cardiology

## 2016-08-15 ENCOUNTER — Encounter: Payer: Self-pay | Admitting: Interventional Cardiology

## 2016-08-15 VITALS — BP 130/68 | HR 68 | Ht 63.0 in | Wt 160.0 lb

## 2016-08-15 DIAGNOSIS — E119 Type 2 diabetes mellitus without complications: Secondary | ICD-10-CM

## 2016-08-15 DIAGNOSIS — I208 Other forms of angina pectoris: Secondary | ICD-10-CM

## 2016-08-15 DIAGNOSIS — Z01812 Encounter for preprocedural laboratory examination: Secondary | ICD-10-CM

## 2016-08-15 DIAGNOSIS — I209 Angina pectoris, unspecified: Secondary | ICD-10-CM | POA: Insufficient documentation

## 2016-08-15 NOTE — Patient Instructions (Addendum)
Medication Instructions:  Your physician recommends that you continue on your current medications as directed. Please refer to the Current Medication list given to you today.  Labwork: Your physician recommends that you return for lab work in: 2 weeks for BMET, CBC, and PT/INR   Testing/Procedures: Your physician has requested that you have a cardiac catheterization. Cardiac catheterization is used to diagnose and/or treat various heart conditions. Doctors may recommend this procedure for a number of different reasons. The most common reason is to evaluate chest pain. Chest pain can be a symptom of coronary artery disease (CAD), and cardiac catheterization can show whether plaque is narrowing or blocking your heart's arteries. This procedure is also used to evaluate the valves, as well as measure the blood flow and oxygen levels in different parts of your heart. For further information please visit [email or web address]. Please follow instruction sheet, as given.  Follow-Up: Your physician recommends that you schedule a follow-up appointment as directed after your heart catheterization.  If you need a refill on your cardiac medications before your next appointment, please call your pharmacy.

## 2016-08-15 NOTE — Progress Notes (Signed)
Cardiology Office Note   Date:  08/15/2016   ID:  Barbara Carroll, DOB [birth year], MRN [id number]  PCP:  Barbara Housekeeper, MD    Chief Complaint  Patient presents with  . Follow-up  . Shortness of Breath  . Dizziness  . Edema     Wt Readings from Last 3 Encounters:  08/15/16 160 lb (72.6 kg)  02/15/16 162 lb (73.5 kg)  01/13/16 161 lb 8 oz (73.3 kg)       History of Present Illness: Barbara Carroll is a 75 y.o. female   who had a stress test in 2011 which showed TID. Repeat stress in 2014 was negative.  She has had anginal sx once a month.  She will also chew a baby aspirin, use NTG and use her husbands oxygen. She can have palpitations too as well. She has used some xanax at times when she has symptoms.   WHen she cleans house, she does not have symptoms. She has been stressed due to her husband's health.  He has many issues apparently related to agent orange and his time in [place].  She has used the NTG several times this week.  SHe is reconsidering the cath.  Also has left arm pain at times.   No bleeding problems. Joint pains limit her, but no surgery planned.They tried a procedure but due to a nickel allergy, she was not a candidate. She has had bilateral cataract surgery in 2015.    Known nickel allergy.  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        Past Medical History:  Diagnosis Date  . Allergic rhinitis   . Anxiety   . Back pain   . CHF (congestive heart failure) (HCC)   . CVA (cerebral infarction)    small right frontoparietal with left facial numbness and lefthand and foot tingling without weakness  . Diabetes mellitus without complication (HCC)   . DJD (degenerative joint disease), cervical   . Dyslipidemia   . Family history of adverse reaction to anesthesia    mother diff to wake up  . Family history of colon cancer   . GERD (gastroesophageal reflux disease)   . H/O actinic keratosis   . Headache   .  Hx: UTI (urinary tract infection)   . Hypertension   . Hypothyroidism   . Kidney stones   . Osteopenia    bone density 2009,vitamin D level, normal 2009  . Pneumonia   . PONV (postoperative nausea and vomiting)   . Shortness of breath dyspnea   . Stroke (HCC)   . Thyroid disease 1970   status post thyroidectomy  . Vaginal bleeding    hysterectomy many years ago,seen by/GYN doctor delcambre,abdominal and pelvic ultrasound 2009 negative     Past Surgical History:  Procedure Laterality Date  . ABDOMINAL HYSTERECTOMY    . CHOLECYSTECTOMY    . COLONOSCOPY    . EYE SURGERY Bilateral   . NOSE SURGERY       Current Outpatient Prescriptions  Medication Sig Dispense Refill  . ALPRAZolam (XANAX) 0.5 MG tablet Take 0.5 mg by mouth at bedtime as needed for sleep.    Marland Kitchen amLODipine (NORVASC) 10 MG tablet Take 10 mg by mouth daily.    Marland Kitchen aspirin 81 MG EC tablet Take 81 mg by mouth daily. Swallow whole.    . B Complex Vitamins (B-COMPLEX/B-12 PO) Take 1 tablet by mouth daily.     . Calcium Citrate-Vitamin D (CALCIUM + D  PO) Take 1 tablet by mouth daily.    . cholecalciferol (VITAMIN D) 1000 UNITS tablet Take 2,000 Units by mouth daily.     . clopidogrel (PLAVIX) 75 MG tablet Take 75 mg by mouth daily.    . Coenzyme Q10 (CO Q 10 PO) Take 1 capsule by mouth daily. 1 tab daily    . fish oil-omega-3 fatty acids 1000 MG capsule Take 2 g by mouth daily.    . lansoprazole (PREVACID) 15 MG capsule Take 15-30 mg by mouth daily.     Marland Kitchen. levothyroxine (SYNTHROID, LEVOTHROID) 100 MCG tablet Take 100 mcg by mouth daily before breakfast.    . lovastatin (MEVACOR) 20 MG tablet Take 20 mg by mouth at bedtime.    . metFORMIN (GLUCOPHAGE) 500 MG tablet Take 500 mg by mouth 2 (two) times daily.  6  . Multiple Vitamin (MULTIVITAMIN) tablet Take 1 tablet by mouth daily.    . nitroGLYCERIN (NITROSTAT) 0.4 MG SL tablet Place 1 tablet (0.4 mg total) under the tongue every 5 (five) minutes as needed for chest pain. 25  tablet 3  . Potassium 99 MG TABS Take 99 mg by mouth daily.    . valsartan-hydrochlorothiazide (DIOVAN-HCT) 320-25 MG per tablet Take 1 tablet by mouth daily.     No current facility-administered medications for this visit.     Allergies:   Pneumovax [pneumococcal polysaccharide vaccine]; Iohexol; Lisinopril; Other; and Ivp dye [iodinated diagnostic agents]    Social History:  The patient  reports that she has quit smoking. She has never used smokeless tobacco. She reports that she does not drink alcohol or use drugs.   Family History:  The patient's family history includes Colon cancer in her mother; Heart disease in her father; Stroke in her mother.    ROS:  Please see the history of present illness.   Otherwise, review of systems are positive for chest pain.   All other systems are reviewed and negative.    PHYSICAL EXAM: VS:  BP 130/68   Pulse 68   Ht 5\' 3"  (1.6 m)   Wt 160 lb (72.6 kg)   BMI 28.34 kg/m  , BMI Body mass index is 28.34 kg/m. GEN: Well nourished, well developed, in no acute distress  HEENT: normal  Neck: no JVD, carotid bruits, or masses Cardiac: RRR; no murmurs, rubs, or gallops,no edema  Respiratory:  clear to auscultation bilaterally, normal work of breathing GI: soft, nontender, nondistended, + BS MS: no deformity or atrophy  Skin: warm and dry, no rash Neuro:  Strength and sensation are intact Psych: euthymic mood, full affect   EKG:   The ekg ordered today demonstrates NSR, nonspecific ST segment changes   Recent Labs: 01/11/2016: ALT 22; BUN 15; Creatinine, Ser 0.73; Hemoglobin 13.2; Platelets 308; Potassium 3.8; Sodium 140   Lipid Panel No results found for: CHOL, TRIG, HDL, CHOLHDL, VLDL, LDLCALC, LDLDIRECT   Other studies Reviewed: Additional studies/ records that were reviewed today with results demonstrating: stress tests reviewed.   ASSESSMENT AND PLAN:  1. Chest pain/SHOB: CP with some features concerning for angina- sometimes at  rest.  Pain free now.  Both parents had heart issues.   I think a cath would be advisable, but she is scared of the procedure.  I answered many questions about the procedure.  Risks and benefits explained to the patient and husband. 2. Diabetes:  Managed by PMD.  If sx get worse, she needs to call 911. 3. Abnormal stress test: TID in  2011.  2014, repeat stress was normal. However, she continues to have some anginal type symptoms.  She is now agreeable to cath.  Will schedule.   4. COnsider LEADERS free trial to see if she can get a stainless steel stent.  IV benadryl to prevent contrast reaction.   Current medicines are reviewed at length with the patient today.  The patient concerns regarding her medicines were addressed.  The following changes have been made:  No change  Labs/ tests ordered today include:  No orders of the defined types were placed in this encounter.   Recommend 150 minutes/week of aerobic exercise Low fat, low carb, high fiber diet recommended  Disposition:   FU post cath   Signed, Lance Muss, MD  08/15/2016 10:27 AM    [facility] [address] [place], [place], [place]  [address] Phone: [phone]; Fax: [phone]

## 2016-08-21 ENCOUNTER — Other Ambulatory Visit (HOSPITAL_COMMUNITY): Payer: Self-pay | Admitting: Interventional Cardiology

## 2016-08-21 DIAGNOSIS — I209 Angina pectoris, unspecified: Secondary | ICD-10-CM

## 2016-08-27 ENCOUNTER — Other Ambulatory Visit: Payer: Medicare Other | Admitting: *Deleted

## 2016-08-27 ENCOUNTER — Telehealth: Payer: Self-pay | Admitting: Interventional Cardiology

## 2016-08-27 DIAGNOSIS — Z01812 Encounter for preprocedural laboratory examination: Secondary | ICD-10-CM

## 2016-08-27 LAB — BASIC METABOLIC PANEL
BUN: 18 mg/dL (ref 7–25)
CALCIUM: 10.4 mg/dL (ref 8.6–10.4)
CHLORIDE: 101 mmol/L (ref 98–110)
CO2: 27 mmol/L (ref 20–31)
Creat: 0.76 mg/dL (ref 0.60–0.93)
Glucose, Bld: 171 mg/dL — ABNORMAL HIGH (ref 65–99)
POTASSIUM: 3.7 mmol/L (ref 3.5–5.3)
SODIUM: 138 mmol/L (ref 135–146)

## 2016-08-27 LAB — CBC WITH DIFFERENTIAL/PLATELET
BASOS ABS: 0 {cells}/uL (ref 0–200)
Basophils Relative: 0 %
EOS ABS: 213 {cells}/uL (ref 15–500)
EOS PCT: 3 %
HCT: 37.5 % (ref 35.0–45.0)
HEMOGLOBIN: 12.7 g/dL (ref 11.7–15.5)
LYMPHS ABS: 2556 {cells}/uL (ref 850–3900)
Lymphocytes Relative: 36 %
MCH: 29.6 pg (ref 27.0–33.0)
MCHC: 33.9 g/dL (ref 32.0–36.0)
MCV: 87.4 fL (ref 80.0–100.0)
MONO ABS: 497 {cells}/uL (ref 200–950)
MPV: 9.8 fL (ref 7.5–12.5)
Monocytes Relative: 7 %
NEUTROS ABS: 3834 {cells}/uL (ref 1500–7800)
Neutrophils Relative %: 54 %
Platelets: 299 10*3/uL (ref 140–400)
RBC: 4.29 MIL/uL (ref 3.80–5.10)
RDW: 13.9 % (ref 11.0–15.0)
WBC: 7.1 10*3/uL (ref 3.8–10.8)

## 2016-08-27 LAB — PROTIME-INR
INR: 1
PROTHROMBIN TIME: 10.4 s (ref 9.0–11.5)

## 2016-08-29 NOTE — Telephone Encounter (Signed)
New message      Pt had a cold last week. She is having a cath on Friday. As of today, no temp but sinus drainage.  Should she see her PCP since her cath is fri?

## 2016-08-29 NOTE — Telephone Encounter (Signed)
The pt states that she is taking her last antibiotic today and that she is breathing well even while lying down.  She is advised per Dr Eldridge DaceVaranasi that it is ok for her to have her cath this Friday. She verbalized understanding.

## 2016-08-31 ENCOUNTER — Encounter (HOSPITAL_COMMUNITY)
Admission: RE | Disposition: A | Discharge status: Home / Self Care | Payer: Self-pay | Source: Ambulatory Visit | Attending: Interventional Cardiology

## 2016-08-31 ENCOUNTER — Encounter (HOSPITAL_COMMUNITY): Payer: Self-pay | Admitting: Interventional Cardiology

## 2016-08-31 ENCOUNTER — Ambulatory Visit (HOSPITAL_COMMUNITY)
Admission: RE | Admit: 2016-08-31 | Discharge: 2016-09-01 | Disposition: A | Discharge status: Home / Self Care | Payer: Medicare Other | Source: Ambulatory Visit | Attending: Interventional Cardiology | Admitting: Interventional Cardiology

## 2016-08-31 DIAGNOSIS — Z7902 Long term (current) use of antithrombotics/antiplatelets: Secondary | ICD-10-CM | POA: Diagnosis not present

## 2016-08-31 DIAGNOSIS — Z8673 Personal history of transient ischemic attack (TIA), and cerebral infarction without residual deficits: Secondary | ICD-10-CM | POA: Diagnosis not present

## 2016-08-31 DIAGNOSIS — K219 Gastro-esophageal reflux disease without esophagitis: Secondary | ICD-10-CM | POA: Diagnosis not present

## 2016-08-31 DIAGNOSIS — E785 Hyperlipidemia, unspecified: Secondary | ICD-10-CM | POA: Insufficient documentation

## 2016-08-31 DIAGNOSIS — I25119 Atherosclerotic heart disease of native coronary artery with unspecified angina pectoris: Secondary | ICD-10-CM | POA: Insufficient documentation

## 2016-08-31 DIAGNOSIS — I509 Heart failure, unspecified: Secondary | ICD-10-CM | POA: Diagnosis not present

## 2016-08-31 DIAGNOSIS — I110 Hypertensive heart disease with heart failure: Secondary | ICD-10-CM | POA: Diagnosis not present

## 2016-08-31 DIAGNOSIS — E039 Hypothyroidism, unspecified: Secondary | ICD-10-CM | POA: Insufficient documentation

## 2016-08-31 DIAGNOSIS — Z7984 Long term (current) use of oral hypoglycemic drugs: Secondary | ICD-10-CM | POA: Insufficient documentation

## 2016-08-31 DIAGNOSIS — Z79899 Other long term (current) drug therapy: Secondary | ICD-10-CM | POA: Diagnosis not present

## 2016-08-31 DIAGNOSIS — F419 Anxiety disorder, unspecified: Secondary | ICD-10-CM | POA: Insufficient documentation

## 2016-08-31 DIAGNOSIS — E119 Type 2 diabetes mellitus without complications: Secondary | ICD-10-CM | POA: Insufficient documentation

## 2016-08-31 DIAGNOSIS — Z7982 Long term (current) use of aspirin: Secondary | ICD-10-CM | POA: Insufficient documentation

## 2016-08-31 DIAGNOSIS — Z87891 Personal history of nicotine dependence: Secondary | ICD-10-CM | POA: Diagnosis not present

## 2016-08-31 DIAGNOSIS — I209 Angina pectoris, unspecified: Secondary | ICD-10-CM

## 2016-08-31 HISTORY — DX: Cerebral infarction, unspecified: I63.9

## 2016-08-31 HISTORY — DX: Major depressive disorder, single episode, unspecified: F32.9

## 2016-08-31 HISTORY — DX: Depression, unspecified: F32.A

## 2016-08-31 HISTORY — DX: Unspecified chronic bronchitis: J42

## 2016-08-31 HISTORY — DX: Dorsalgia, unspecified: M54.9

## 2016-08-31 HISTORY — DX: Type 2 diabetes mellitus without complications: E11.9

## 2016-08-31 HISTORY — DX: Atherosclerotic heart disease of native coronary artery without angina pectoris: I25.10

## 2016-08-31 HISTORY — PX: CARDIAC CATHETERIZATION: SHX172

## 2016-08-31 HISTORY — DX: Unspecified osteoarthritis, unspecified site: M19.90

## 2016-08-31 HISTORY — DX: Other chronic pain: G89.29

## 2016-08-31 HISTORY — DX: Sleep apnea, unspecified: G47.30

## 2016-08-31 LAB — POCT ACTIVATED CLOTTING TIME: Activated Clotting Time: 433 seconds

## 2016-08-31 LAB — GLUCOSE, CAPILLARY
GLUCOSE-CAPILLARY: 113 mg/dL — AB (ref 65–99)
GLUCOSE-CAPILLARY: 141 mg/dL — AB (ref 65–99)
Glucose-Capillary: 142 mg/dL — ABNORMAL HIGH (ref 65–99)
Glucose-Capillary: 235 mg/dL — ABNORMAL HIGH (ref 65–99)

## 2016-08-31 SURGERY — LEFT HEART CATH AND CORONARY ANGIOGRAPHY
Anesthesia: LOCAL

## 2016-08-31 MED ORDER — CLOPIDOGREL BISULFATE 300 MG PO TABS
ORAL_TABLET | ORAL | Status: AC
  Filled 2016-08-31: qty 1

## 2016-08-31 MED ORDER — SODIUM CHLORIDE 0.9 % IV SOLN: 250.0000 mL | INTRAVENOUS | Status: DC | PRN

## 2016-08-31 MED ORDER — LIDOCAINE HCL (PF) 1 % IJ SOLN
INTRAMUSCULAR | Status: DC | PRN
  Administered 2016-08-31: 2 mL

## 2016-08-31 MED ORDER — MIDAZOLAM HCL 2 MG/2ML IJ SOLN
INTRAMUSCULAR | Status: AC
  Filled 2016-08-31: qty 2

## 2016-08-31 MED ORDER — SODIUM CHLORIDE 0.9% FLUSH
3.0000 mL | Freq: Two times a day (BID) | INTRAVENOUS | Status: DC
  Administered 2016-08-31: 3 mL via INTRAVENOUS

## 2016-08-31 MED ORDER — DIPHENHYDRAMINE HCL 50 MG/ML IJ SOLN
25.0000 mg | Freq: Once | INTRAMUSCULAR | Status: AC
  Administered 2016-08-31: 25 mg via INTRAVENOUS

## 2016-08-31 MED ORDER — IRBESARTAN 300 MG PO TABS: 300.0000 mg | ORAL_TABLET | Freq: Every day | ORAL | Status: DC

## 2016-08-31 MED ORDER — CLOPIDOGREL BISULFATE 75 MG PO TABS: 75.0000 mg | ORAL_TABLET | Freq: Every day | ORAL | Status: DC

## 2016-08-31 MED ORDER — BIVALIRUDIN BOLUS VIA INFUSION - CUPID
INTRAVENOUS | Status: DC | PRN
  Administered 2016-08-31: 54.45 mg via INTRAVENOUS

## 2016-08-31 MED ORDER — HEPARIN (PORCINE) IN NACL 2-0.9 UNIT/ML-% IJ SOLN
INTRAMUSCULAR | Status: DC | PRN
  Administered 2016-08-31: 1000 mL

## 2016-08-31 MED ORDER — PRAVASTATIN SODIUM 40 MG PO TABS
20.0000 mg | ORAL_TABLET | Freq: Every day | ORAL | Status: DC
  Administered 2016-08-31: 20 mg via ORAL
  Filled 2016-08-31: qty 1

## 2016-08-31 MED ORDER — VERAPAMIL HCL 2.5 MG/ML IV SOLN
INTRAVENOUS | Status: AC
  Filled 2016-08-31: qty 2

## 2016-08-31 MED ORDER — FENTANYL CITRATE (PF) 100 MCG/2ML IJ SOLN
INTRAMUSCULAR | Status: AC
Start: 2016-08-31 — End: 2016-08-31
  Filled 2016-08-31: qty 2

## 2016-08-31 MED ORDER — ONDANSETRON HCL 4 MG/2ML IJ SOLN
4.0000 mg | Freq: Four times a day (QID) | INTRAMUSCULAR | Status: DC | PRN
  Administered 2016-08-31: 4 mg via INTRAVENOUS

## 2016-08-31 MED ORDER — FAMOTIDINE IN NACL 20-0.9 MG/50ML-% IV SOLN
INTRAVENOUS | Status: AC
  Administered 2016-08-31: 20 mg via INTRAVENOUS
  Filled 2016-08-31: qty 50

## 2016-08-31 MED ORDER — IOPAMIDOL (ISOVUE-370) INJECTION 76%
INTRAVENOUS | Status: AC
  Filled 2016-08-31: qty 100

## 2016-08-31 MED ORDER — VERAPAMIL HCL 2.5 MG/ML IV SOLN
INTRAVENOUS | Status: DC | PRN
  Administered 2016-08-31: 10 mL via INTRA_ARTERIAL

## 2016-08-31 MED ORDER — ASPIRIN EC 81 MG PO TBEC: 81.0000 mg | DELAYED_RELEASE_TABLET | Freq: Every day | ORAL | Status: DC

## 2016-08-31 MED ORDER — ANGIOPLASTY BOOK
Freq: Once | Status: AC
  Administered 2016-08-31: 23:00:00
  Filled 2016-08-31: qty 1

## 2016-08-31 MED ORDER — LIDOCAINE HCL (PF) 1 % IJ SOLN
INTRAMUSCULAR | Status: AC
  Filled 2016-08-31: qty 30

## 2016-08-31 MED ORDER — ASPIRIN 81 MG PO CHEW
CHEWABLE_TABLET | ORAL | Status: AC
  Filled 2016-08-31: qty 1

## 2016-08-31 MED ORDER — METHYLPREDNISOLONE SODIUM SUCC 125 MG IJ SOLR
125.0000 mg | Freq: Once | INTRAMUSCULAR | Status: AC
  Administered 2016-08-31: 125 mg via INTRAVENOUS

## 2016-08-31 MED ORDER — BIVALIRUDIN 250 MG IV SOLR
INTRAVENOUS | Status: AC
  Filled 2016-08-31: qty 250

## 2016-08-31 MED ORDER — NITROGLYCERIN 1 MG/10 ML FOR IR/CATH LAB
INTRA_ARTERIAL | Status: AC
  Filled 2016-08-31: qty 10

## 2016-08-31 MED ORDER — HEPARIN SODIUM (PORCINE) 1000 UNIT/ML IJ SOLN
INTRAMUSCULAR | Status: AC
Start: 2016-08-31 — End: 2016-08-31
  Filled 2016-08-31: qty 1

## 2016-08-31 MED ORDER — VALSARTAN-HYDROCHLOROTHIAZIDE 320-25 MG PO TABS: 1.0000 | ORAL_TABLET | Freq: Every day | ORAL | Status: DC

## 2016-08-31 MED ORDER — SODIUM CHLORIDE 0.9 % WEIGHT BASED INFUSION: 1.0000 mL/kg/h | INTRAVENOUS | Status: DC

## 2016-08-31 MED ORDER — IOPAMIDOL (ISOVUE-370) INJECTION 76%
INTRAVENOUS | Status: DC | PRN
  Administered 2016-08-31: 140 mL via INTRA_ARTERIAL

## 2016-08-31 MED ORDER — CLOPIDOGREL BISULFATE 300 MG PO TABS
ORAL_TABLET | ORAL | Status: DC | PRN
  Administered 2016-08-31: 300 mg via ORAL

## 2016-08-31 MED ORDER — PANTOPRAZOLE SODIUM 20 MG PO TBEC
20.0000 mg | DELAYED_RELEASE_TABLET | Freq: Every day | ORAL | Status: DC
  Administered 2016-08-31: 20 mg via ORAL
  Filled 2016-08-31: qty 1

## 2016-08-31 MED ORDER — ASPIRIN 81 MG PO CHEW: 81.0000 mg | CHEWABLE_TABLET | Freq: Every day | ORAL | Status: DC

## 2016-08-31 MED ORDER — FENTANYL CITRATE (PF) 100 MCG/2ML IJ SOLN
INTRAMUSCULAR | Status: DC | PRN
  Administered 2016-08-31 (×4): 25 ug via INTRAVENOUS

## 2016-08-31 MED ORDER — FAMOTIDINE IN NACL 20-0.9 MG/50ML-% IV SOLN
20.0000 mg | Freq: Once | INTRAVENOUS | Status: AC
  Administered 2016-08-31: 20 mg via INTRAVENOUS

## 2016-08-31 MED ORDER — METHYLPREDNISOLONE SODIUM SUCC 125 MG IJ SOLR
INTRAMUSCULAR | Status: AC
  Filled 2016-08-31: qty 2

## 2016-08-31 MED ORDER — HEPARIN SODIUM (PORCINE) 1000 UNIT/ML IJ SOLN
INTRAMUSCULAR | Status: DC | PRN
  Administered 2016-08-31: 4000 [IU] via INTRAVENOUS

## 2016-08-31 MED ORDER — ACETAMINOPHEN 500 MG PO TABS: 1000.0000 mg | ORAL_TABLET | Freq: Four times a day (QID) | ORAL | Status: DC | PRN

## 2016-08-31 MED ORDER — LEVOTHYROXINE SODIUM 100 MCG PO TABS
100.0000 ug | ORAL_TABLET | Freq: Every day | ORAL | Status: DC
  Administered 2016-09-01: 100 ug via ORAL
  Filled 2016-08-31: qty 1

## 2016-08-31 MED ORDER — DIPHENHYDRAMINE HCL 50 MG/ML IJ SOLN
INTRAMUSCULAR | Status: AC
  Administered 2016-08-31: 25 mg via INTRAVENOUS
  Filled 2016-08-31: qty 1

## 2016-08-31 MED ORDER — ONDANSETRON HCL 4 MG/2ML IJ SOLN
INTRAMUSCULAR | Status: AC
  Filled 2016-08-31: qty 2

## 2016-08-31 MED ORDER — SODIUM CHLORIDE 0.9% FLUSH: 3.0000 mL | Freq: Two times a day (BID) | INTRAVENOUS | Status: DC

## 2016-08-31 MED ORDER — ACETAMINOPHEN 325 MG PO TABS: 650.0000 mg | ORAL_TABLET | ORAL | Status: DC | PRN

## 2016-08-31 MED ORDER — ALPRAZOLAM 0.5 MG PO TABS
0.5000 mg | ORAL_TABLET | Freq: Every evening | ORAL | Status: DC | PRN
  Administered 2016-08-31: 0.5 mg via ORAL
  Filled 2016-08-31: qty 1

## 2016-08-31 MED ORDER — HYDROCHLOROTHIAZIDE 25 MG PO TABS: 25.0000 mg | ORAL_TABLET | Freq: Every day | ORAL | Status: DC

## 2016-08-31 MED ORDER — NITROGLYCERIN 0.4 MG SL SUBL: 0.4000 mg | SUBLINGUAL_TABLET | SUBLINGUAL | Status: DC | PRN

## 2016-08-31 MED ORDER — HEPARIN (PORCINE) IN NACL 2-0.9 UNIT/ML-% IJ SOLN
INTRAMUSCULAR | Status: AC
  Filled 2016-08-31: qty 1000

## 2016-08-31 MED ORDER — SODIUM CHLORIDE 0.9% FLUSH: 3.0000 mL | INTRAVENOUS | Status: DC | PRN

## 2016-08-31 MED ORDER — SODIUM CHLORIDE 0.9 % WEIGHT BASED INFUSION
3.0000 mL/kg/h | INTRAVENOUS | Status: DC
  Administered 2016-08-31: 3 mL/kg/h via INTRAVENOUS

## 2016-08-31 MED ORDER — MIDAZOLAM HCL 2 MG/2ML IJ SOLN
INTRAMUSCULAR | Status: DC | PRN
  Administered 2016-08-31 (×2): 1 mg via INTRAVENOUS
  Administered 2016-08-31: 2 mg via INTRAVENOUS

## 2016-08-31 MED ORDER — NITROGLYCERIN 1 MG/10 ML FOR IR/CATH LAB
INTRA_ARTERIAL | Status: DC | PRN
  Administered 2016-08-31: 100 ug via INTRA_ARTERIAL
  Administered 2016-08-31: 200 ug via INTRA_ARTERIAL
  Administered 2016-08-31 (×2): 200 ug via INTRACORONARY

## 2016-08-31 MED ORDER — AMLODIPINE BESYLATE 10 MG PO TABS
10.0000 mg | ORAL_TABLET | Freq: Every day | ORAL | Status: DC
  Administered 2016-08-31: 10 mg via ORAL
  Filled 2016-08-31: qty 1

## 2016-08-31 MED ORDER — BIVALIRUDIN 250 MG IV SOLR
INTRAVENOUS | Status: DC | PRN
  Administered 2016-08-31: 1.75 mg/kg/h via INTRAVENOUS

## 2016-08-31 MED ORDER — ASPIRIN 81 MG PO CHEW
81.0000 mg | CHEWABLE_TABLET | ORAL | Status: AC
  Administered 2016-08-31: 81 mg via ORAL

## 2016-08-31 SURGICAL SUPPLY — 19 items
BALLN EMERGE MR 2.5X15 (BALLOONS) ×2
BALLN ~~LOC~~ TREK RX 3.75X12 (BALLOONS) ×2
BALLOON EMERGE MR 2.5X15 (BALLOONS) ×1 IMPLANT
BALLOON ~~LOC~~ TREK RX 3.75X12 (BALLOONS) ×1 IMPLANT
CATH IMPULSE 5F ANG/FL3.5 (CATHETERS) ×2 IMPLANT
CATH LAUNCHER 6FR 3DRIGHT (CATHETERS) ×1 IMPLANT
CATHETER LAUNCHER 6FR 3DRIGHT (CATHETERS) ×2
DEVICE RAD COMP TR BAND LRG (VASCULAR PRODUCTS) ×2 IMPLANT
GLIDESHEATH SLEND SS 6F .021 (SHEATH) ×2 IMPLANT
KIT ENCORE 26 ADVANTAGE (KITS) ×2 IMPLANT
KIT HEART LEFT (KITS) ×2 IMPLANT
PACK CARDIAC CATHETERIZATION (CUSTOM PROCEDURE TRAY) ×2 IMPLANT
STENT XIENCE ALPINE RX 3.5X15 (Permanent Stent) ×2 IMPLANT
TRANSDUCER W/STOPCOCK (MISCELLANEOUS) ×2 IMPLANT
TUBING CIL FLEX 10 FLL-RA (TUBING) ×2 IMPLANT
VALVE GUARDIAN II ~~LOC~~ HEMO (MISCELLANEOUS) ×2 IMPLANT
WIRE ASAHI PROWATER 180CM (WIRE) ×2 IMPLANT
WIRE HI TORQ VERSACORE-J 145CM (WIRE) ×2 IMPLANT
WIRE SAFE-T 1.5MM-J .035X260CM (WIRE) ×2 IMPLANT

## 2016-08-31 NOTE — Care Management Note (Signed)
Case Management Note  Patient Details  Name: Barbara Carroll MRN: [name] Date of Birth: 12/06/1940  Subjective/Objective:   S/p coronary intervention, already on plavix, NCM will cont to follow for dc needs.                  Action/Plan:   Expected Discharge Date:                  Expected Discharge Plan:  Home/Self Care  In-House Referral:     Discharge planning Services  CM Consult  Post Acute Care Choice:    Choice offered to:     DME Arranged:    DME Agency:     HH Arranged:    HH Agency:     Status of Service:  In process, will continue to follow  If discussed at Long Length of Stay Meetings, dates discussed:    Additional Comments:  Leone Havenaylor, Kalob Bergen Clinton, RN 08/31/2016, 12:19 PM

## 2016-08-31 NOTE — Interval H&P Note (Signed)
Cath Lab Visit (complete for each Cath Lab visit)  Clinical Evaluation Leading to the Procedure:   ACS: No.  Non-ACS:    Anginal Classification: CCS III  Anti-ischemic medical therapy: Minimal Therapy (1 class of medications)  Non-Invasive Test Results: No non-invasive testing performed  Prior CABG: No previous CABG      History and Physical Interval Note:  08/31/2016 8:48 AM  Barbara Carroll  has presented today for surgery, with the diagnosis of angina  The various methods of treatment have been discussed with the patient and family. After consideration of risks, benefits and other options for treatment, the patient has consented to  Procedure(s): Left Heart Cath and Coronary Angiography (N/A) as a surgical intervention .  The patient's history has been reviewed, patient examined, no change in status, stable for surgery.  I have reviewed the patient's chart and labs.  Questions were answered to the patient's satisfaction.     Lance MussJayadeep Zacharie Portner

## 2016-08-31 NOTE — Research (Signed)
CADLAD Informed Consent   Subject Name: Barbara Carroll  Subject met inclusion and exclusion criteria.  The informed consent form, study requirements and expectations were reviewed with the subject and questions and concerns were addressed prior to the signing of the consent form.  The subject verbalized understanding of the trail requirements.  The subject agreed to participate in the CADLAD trial and signed the informed consent.  The informed consent was obtained prior to performance of any protocol-specific procedures for the subject.  A copy of the signed informed consent was given to the subject and a copy was placed in the subject's medical record.  Berneda Rose 08/31/2016, 8:21 AM

## 2016-08-31 NOTE — Progress Notes (Signed)
TR BAND REMOVAL  LOCATION:    right radial  DEFLATED PER PROTOCOL:    Yes.    TIME BAND OFF / DRESSING APPLIED:    1430   SITE UPON ARRIVAL:    Level 0  SITE AFTER BAND REMOVAL:    Level 0  CIRCULATION SENSATION AND MOVEMENT:    Within Normal Limits   Yes.    COMMENTS:   Rechecked frequently with no change in assessment

## 2016-08-31 NOTE — H&P (View-Only) (Signed)
Cardiology Office Note   Date:  08/15/2016   ID:  Barbara Carroll, DOB [birth year], MRN [id number]  PCP:  Georgann Housekeeper, MD    Chief Complaint  Patient presents with  . Follow-up  . Shortness of Breath  . Dizziness  . Edema     Wt Readings from Last 3 Encounters:  08/15/16 160 lb (72.6 kg)  02/15/16 162 lb (73.5 kg)  01/13/16 161 lb 8 oz (73.3 kg)       History of Present Illness: Barbara Carroll is a 75 y.o. female   who had a stress test in 2011 which showed TID. Repeat stress in 2014 was negative.  She has had anginal sx once a month.  She will also chew a baby aspirin, use NTG and use her husbands oxygen. She can have palpitations too as well. She has used some xanax at times when she has symptoms.   WHen she cleans house, she does not have symptoms. She has been stressed due to her husband's health.  He has many issues apparently related to agent orange and his time in [place].  She has used the NTG several times this week.  SHe is reconsidering the cath.  Also has left arm pain at times.   No bleeding problems. Joint pains limit her, but no surgery planned.They tried a procedure but due to a nickel allergy, she was not a candidate. She has had bilateral cataract surgery in 2015.    Known nickel allergy.  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        Past Medical History:  Diagnosis Date  . Allergic rhinitis   . Anxiety   . Back pain   . CHF (congestive heart failure) (HCC)   . CVA (cerebral infarction)    small right frontoparietal with left facial numbness and lefthand and foot tingling without weakness  . Diabetes mellitus without complication (HCC)   . DJD (degenerative joint disease), cervical   . Dyslipidemia   . Family history of adverse reaction to anesthesia    mother diff to wake up  . Family history of colon cancer   . GERD (gastroesophageal reflux disease)   . H/O actinic keratosis   . Headache   .  Hx: UTI (urinary tract infection)   . Hypertension   . Hypothyroidism   . Kidney stones   . Osteopenia    bone density 2009,vitamin D level, normal 2009  . Pneumonia   . PONV (postoperative nausea and vomiting)   . Shortness of breath dyspnea   . Stroke (HCC)   . Thyroid disease 1970   status post thyroidectomy  . Vaginal bleeding    hysterectomy many years ago,seen by/GYN doctor delcambre,abdominal and pelvic ultrasound 2009 negative     Past Surgical History:  Procedure Laterality Date  . ABDOMINAL HYSTERECTOMY    . CHOLECYSTECTOMY    . COLONOSCOPY    . EYE SURGERY Bilateral   . NOSE SURGERY       Current Outpatient Prescriptions  Medication Sig Dispense Refill  . ALPRAZolam (XANAX) 0.5 MG tablet Take 0.5 mg by mouth at bedtime as needed for sleep.    Marland Kitchen amLODipine (NORVASC) 10 MG tablet Take 10 mg by mouth daily.    Marland Kitchen aspirin 81 MG EC tablet Take 81 mg by mouth daily. Swallow whole.    . B Complex Vitamins (B-COMPLEX/B-12 PO) Take 1 tablet by mouth daily.     . Calcium Citrate-Vitamin D (CALCIUM + D  PO) Take 1 tablet by mouth daily.    . cholecalciferol (VITAMIN D) 1000 UNITS tablet Take 2,000 Units by mouth daily.     . clopidogrel (PLAVIX) 75 MG tablet Take 75 mg by mouth daily.    . Coenzyme Q10 (CO Q 10 PO) Take 1 capsule by mouth daily. 1 tab daily    . fish oil-omega-3 fatty acids 1000 MG capsule Take 2 g by mouth daily.    . lansoprazole (PREVACID) 15 MG capsule Take 15-30 mg by mouth daily.     Marland Kitchen. levothyroxine (SYNTHROID, LEVOTHROID) 100 MCG tablet Take 100 mcg by mouth daily before breakfast.    . lovastatin (MEVACOR) 20 MG tablet Take 20 mg by mouth at bedtime.    . metFORMIN (GLUCOPHAGE) 500 MG tablet Take 500 mg by mouth 2 (two) times daily.  6  . Multiple Vitamin (MULTIVITAMIN) tablet Take 1 tablet by mouth daily.    . nitroGLYCERIN (NITROSTAT) 0.4 MG SL tablet Place 1 tablet (0.4 mg total) under the tongue every 5 (five) minutes as needed for chest pain. 25  tablet 3  . Potassium 99 MG TABS Take 99 mg by mouth daily.    . valsartan-hydrochlorothiazide (DIOVAN-HCT) 320-25 MG per tablet Take 1 tablet by mouth daily.     No current facility-administered medications for this visit.     Allergies:   Pneumovax [pneumococcal polysaccharide vaccine]; Iohexol; Lisinopril; Other; and Ivp dye [iodinated diagnostic agents]    Social History:  The patient  reports that she has quit smoking. She has never used smokeless tobacco. She reports that she does not drink alcohol or use drugs.   Family History:  The patient's family history includes Colon cancer in her mother; Heart disease in her father; Stroke in her mother.    ROS:  Please see the history of present illness.   Otherwise, review of systems are positive for chest pain.   All other systems are reviewed and negative.    PHYSICAL EXAM: VS:  BP 130/68   Pulse 68   Ht 5\' 3"  (1.6 m)   Wt 160 lb (72.6 kg)   BMI 28.34 kg/m  , BMI Body mass index is 28.34 kg/m. GEN: Well nourished, well developed, in no acute distress  HEENT: normal  Neck: no JVD, carotid bruits, or masses Cardiac: RRR; no murmurs, rubs, or gallops,no edema  Respiratory:  clear to auscultation bilaterally, normal work of breathing GI: soft, nontender, nondistended, + BS MS: no deformity or atrophy  Skin: warm and dry, no rash Neuro:  Strength and sensation are intact Psych: euthymic mood, full affect   EKG:   The ekg ordered today demonstrates NSR, nonspecific ST segment changes   Recent Labs: 01/11/2016: ALT 22; BUN 15; Creatinine, Ser 0.73; Hemoglobin 13.2; Platelets 308; Potassium 3.8; Sodium 140   Lipid Panel No results found for: CHOL, TRIG, HDL, CHOLHDL, VLDL, LDLCALC, LDLDIRECT   Other studies Reviewed: Additional studies/ records that were reviewed today with results demonstrating: stress tests reviewed.   ASSESSMENT AND PLAN:  1. Chest pain/SHOB: CP with some features concerning for angina- sometimes at  rest.  Pain free now.  Both parents had heart issues.   I think a cath would be advisable, but she is scared of the procedure.  I answered many questions about the procedure.  Risks and benefits explained to the patient and husband. 2. Diabetes:  Managed by PMD.  If sx get worse, she needs to call 911. 3. Abnormal stress test: TID in  2011.  2014, repeat stress was normal. However, she continues to have some anginal type symptoms.  She is now agreeable to cath.  Will schedule.   4. COnsider LEADERS free trial to see if she can get a stainless steel stent.  IV benadryl to prevent contrast reaction.   Current medicines are reviewed at length with the patient today.  The patient concerns regarding her medicines were addressed.  The following changes have been made:  No change  Labs/ tests ordered today include:  No orders of the defined types were placed in this encounter.   Recommend 150 minutes/week of aerobic exercise Low fat, low carb, high fiber diet recommended  Disposition:   FU post cath   Signed, Lance Muss, MD  08/15/2016 10:27 AM    [facility] [address] [place], [place], [place]  [address] Phone: [phone]; Fax: [phone]

## 2016-09-01 ENCOUNTER — Encounter (HOSPITAL_COMMUNITY): Payer: Self-pay | Admitting: Physician Assistant

## 2016-09-01 DIAGNOSIS — I209 Angina pectoris, unspecified: Secondary | ICD-10-CM

## 2016-09-01 DIAGNOSIS — K219 Gastro-esophageal reflux disease without esophagitis: Secondary | ICD-10-CM | POA: Diagnosis not present

## 2016-09-01 DIAGNOSIS — F419 Anxiety disorder, unspecified: Secondary | ICD-10-CM | POA: Diagnosis not present

## 2016-09-01 DIAGNOSIS — E119 Type 2 diabetes mellitus without complications: Secondary | ICD-10-CM | POA: Diagnosis not present

## 2016-09-01 DIAGNOSIS — I25119 Atherosclerotic heart disease of native coronary artery with unspecified angina pectoris: Secondary | ICD-10-CM | POA: Diagnosis not present

## 2016-09-01 LAB — BASIC METABOLIC PANEL
Anion gap: 9 (ref 5–15)
BUN: 17 mg/dL (ref 6–20)
CALCIUM: 9.5 mg/dL (ref 8.9–10.3)
CHLORIDE: 102 mmol/L (ref 101–111)
CO2: 23 mmol/L (ref 22–32)
CREATININE: 0.67 mg/dL (ref 0.44–1.00)
GFR calc Af Amer: >60 mL/min (ref >60)
Glucose, Bld: 144 mg/dL — ABNORMAL HIGH (ref 65–99)
Potassium: 3.8 mmol/L (ref 3.5–5.1)
Sodium: 134 mmol/L — ABNORMAL LOW (ref 135–145)

## 2016-09-01 LAB — CBC
HCT: 36.3 % (ref 36.0–46.0)
Hemoglobin: 12 g/dL (ref 12.0–15.0)
MCH: 29.3 pg (ref 26.0–34.0)
MCHC: 33.1 g/dL (ref 30.0–36.0)
MCV: 88.5 fL (ref 78.0–100.0)
PLATELETS: 306 10*3/uL (ref 150–400)
RBC: 4.1 MIL/uL (ref 3.87–5.11)
RDW: 14.2 % (ref 11.5–15.5)
WBC: 14.2 10*3/uL — AB (ref 4.0–10.5)

## 2016-09-01 LAB — GLUCOSE, CAPILLARY: Glucose-Capillary: 166 mg/dL — ABNORMAL HIGH (ref 65–99)

## 2016-09-01 MED ORDER — PANTOPRAZOLE SODIUM 40 MG PO TBEC: 40.0000 mg | DELAYED_RELEASE_TABLET | Freq: Every day | ORAL | 11 refills | Status: DC

## 2016-09-01 NOTE — Discharge Summary (Signed)
Discharge Summary    Patient ID: Barbara CannyCarol Carroll,  MRN: [id number], DOB/AGE: 76/02/1941 76 y.o.  Admit date: 08/31/2016 Discharge date: 09/01/2016  Primary Care Provider: WUJWJX,BJYNWGHUSAIN,KARRAR Primary Cardiologist: Dr. Eldridge DaceVaranasi  Discharge Diagnoses    Active Problems:   Angina pectoris (HCC)   HTN   DM   Hx of abnormal Myoview   Allergies Allergies  Allergen Reactions  . Pneumovax [Pneumococcal Polysaccharide Vaccine]     Severe local swelling   . Iohexol Hives     Desc: HIVES SEVERAL HRS S/P IV CONTRAST.. [place] [facility].   Marland Kitchen. Lisinopril Cough    Cough   . Other Rash    Nickle  When teeth were wired in she place, patient obtained an infection and wiring had to be removed, Also allergic to silver  . Ivp Dye [Iodinated Diagnostic Agents] Itching    Diagnostic Studies/Procedures    Coronary Stent Intervention  Left Heart Cath and Coronary Angiography  Conclusion     The left ventricular systolic function is normal.  LV end diastolic pressure is normal.  The left ventricular ejection fraction is 55-65% by visual estimate.  There is no aortic valve stenosis.  Prox Cx lesion, 95 %stenosed. A STENT XIENCE ALPINE RX 3.5X15 drug eluting stent was successfully placed, postdilated to 3.8 mm.  Post intervention, there is a 0% residual stenosis.   Continue dual antiplatelet therapy for at least a year.  She has been on Plavix chronically.  Continue aggressive secondary prevention.  Anticipate discharge in AM.      History of Present Illness    Barbara Carroll is a [age] y.o. female  who had abnormal myoview, HTN, HLD, CVA, DM who presented for scheduled cath.   Stress test in 2011 which showed TID. Repeat stress in 2014 was negative. She has had anginal sx once a month.  She will also chew a baby aspirin, use NTG and use her husbands oxygen. She had denied cath in past however recently agreeable to cath when last seen by Dr. Eldridge DaceVaranasi 08/15/16. Both  parents had heart issues.  Hospital Course     Consultants: None  Her cath showed 95% stenosed pro Cx s/p PTCA and DES (STENT XIENCE ALPINE RX 3.5X15). Continue ASA, plavix and statin. Hold metformin 48 ours post cath. Ambulated well. Continued home medications. Pravacid changed to protonix to avoid possible interaction with plavix.   The patient has been seen by Dr. Antoine PocheHochrein  today and deemed ready for discharge home. All follow-up appointments have been scheduled. Discharge medications are listed below.  _____________   Discharge Vitals Blood pressure (!) 134/48, pulse 85, temperature 97.4 F (36.3 C), temperature source Oral, resp. rate (!) 21, height 5' 3.5" (1.613 m), weight 174 lb 2.6 oz (79 kg), SpO2 95 %.  Filed Weights   08/31/16 0642 09/01/16 0424  Weight: 160 lb (72.6 kg) 174 lb 2.6 oz (79 kg)    Labs & Radiologic Studies     CBC  Recent Labs  09/01/16 0744  WBC 14.2*  HGB 12.0  HCT 36.3  MCV 88.5  PLT 306   Basic Metabolic Panel  Recent Labs  09/01/16 0744  NA 134*  K 3.8  CL 102  CO2 23  GLUCOSE 144*  BUN 17  CREATININE 0.67  CALCIUM 9.5   Liver Function Tests No results for input(s): AST, ALT, ALKPHOS, BILITOT, PROT, ALBUMIN in the last 72 hours. No results for input(s): LIPASE, AMYLASE in the  last 72 hours. Cardiac Enzymes No results for input(s): CKTOTAL, CKMB, CKMBINDEX, TROPONINI in the last 72 hours. BNP Invalid input(s): POCBNP D-Dimer No results for input(s): DDIMER in the last 72 hours. Hemoglobin A1C No results for input(s): HGBA1C in the last 72 hours. Fasting Lipid Panel No results for input(s): CHOL, HDL, LDLCALC, TRIG, CHOLHDL, LDLDIRECT in the last 72 hours. Thyroid Function Tests No results for input(s): TSH, T4TOTAL, T3FREE, THYROIDAB in the last 72 hours.  Invalid input(s): FREET3  No results found.  Disposition   Pt is being discharged home today in good condition.  Follow-up Plans & Appointments    Follow-up  Information    Lance Muss, MD .   Specialties:  Cardiology, Radiology, Interventional Cardiology Why:  office will call with appointment  Contact information: [address] [place] [address] [phone]          Discharge Instructions    Call MD for:  redness, tenderness, or signs of infection (pain, swelling, redness, odor or green/yellow discharge around incision site)    Complete by:  As directed    Diet - low sodium heart healthy    Complete by:  As directed    Discharge instructions    Complete by:  As directed    No driving for 48 hours. No lifting over 5 lbs for 1 week. No sexual activity for 1 week. Keep procedure site clean & dry. If you notice increased pain, swelling, bleeding or pus, call/return!  You may shower, but no soaking baths/hot tubs/pools for 1 week.  Hold metformin today and tomorrow. Resume Monday 09/03/16.   Increase activity slowly    Complete by:  As directed       Discharge Medications   Current Discharge Medication List    START taking these medications   Details  pantoprazole (PROTONIX) 40 MG tablet Take 1 tablet (40 mg total) by mouth daily. Qty: 30 tablet, Refills: 11      CONTINUE these medications which have NOT CHANGED   Details  acetaminophen (TYLENOL) 500 MG tablet Take 1,000 mg by mouth every 6 (six) hours as needed for mild pain.    ALPRAZolam (XANAX) 0.5 MG tablet Take 0.5 mg by mouth at bedtime as needed for sleep.    amLODipine (NORVASC) 10 MG tablet Take 10 mg by mouth daily.    aspirin 81 MG EC tablet Take 81 mg by mouth daily. Swallow whole.    B Complex Vitamins (B-COMPLEX/B-12 PO) Take 1 tablet by mouth daily.     Calcium Citrate-Vitamin D (CALCIUM + D PO) Take 1 tablet by mouth daily.    cholecalciferol (VITAMIN D) 1000 UNITS tablet Take 2,000 Units by mouth daily.     clopidogrel (PLAVIX) 75 MG tablet Take 75 mg by mouth daily.    Coenzyme Q10 (CO Q 10 PO) Take 1 capsule by mouth  daily. 1 tab daily    fish oil-omega-3 fatty acids 1000 MG capsule Take 2 g by mouth daily.    levothyroxine (SYNTHROID, LEVOTHROID) 100 MCG tablet Take 100 mcg by mouth daily before breakfast.    lovastatin (MEVACOR) 20 MG tablet Take 20 mg by mouth at bedtime.    metFORMIN (GLUCOPHAGE) 500 MG tablet Take 250 mg by mouth 2 (two) times daily.  Refills: 6    Multiple Vitamin (MULTIVITAMIN) tablet Take 1 tablet by mouth daily.    nitroGLYCERIN (NITROSTAT) 0.4 MG SL tablet Place 1 tablet (0.4 mg total) under the tongue every 5 (five)  minutes as needed for chest pain. Qty: 25 tablet, Refills: 3    Potassium 99 MG TABS Take 99 mg by mouth daily.    valsartan-hydrochlorothiazide (DIOVAN-HCT) 320-25 MG per tablet Take 1 tablet by mouth daily.    valACYclovir (VALTREX) 500 MG tablet Take 1,000 mg by mouth daily as needed (cold sores).       STOP taking these medications     lansoprazole (PREVACID) 15 MG capsule      levofloxacin (LEVAQUIN) 500 MG tablet            Outstanding Labs/Studies   None  Duration of Discharge Encounter   Greater than 30 minutes including physician time.  Signed, Della Scrivener PA-C 09/01/2016, 9:04 AM

## 2016-09-01 NOTE — Progress Notes (Signed)
CARDIAC REHAB PHASE I   PRE:  Rate/Rhythm: 86 SR  BP:  Supine:   Sitting: 140/49  Standing:    SaO2: 97 RA  MODE:  Ambulation: 440 ft   POST:  Rate/Rhythm: 92 SR  BP:  Supine:   Sitting: 138/48  Standing:    SaO2: 97 RA Tolerated ambulation well without angina or difficulty.  Education completed re: angina symptoms, NTG usage, anti-platelet therapy (has been on plavix and baby aspirin due to a stroke).  Patient and husband live in a motor home and [place].  I will refer to phase II cardiac rehab at the [place] facility to [place], [place] be [place], [place].  Quit smoking several years ago, does most of the cooking for she and her husband.  Eats several servings of fruits and vegetables daily.  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  Suggested water aerobics and to split up her walking to 15 minutes two times daily to decrease the stress on her knee.   [phone] Cindra EvesBehrens, Makell Cyr Adele RN, BSN 09/01/2016 8:57 AM

## 2016-09-01 NOTE — Progress Notes (Signed)
Patient Name: Barbara CannyCarol Carroll Date of Encounter: 09/01/2016  Primary cardiologist: Dr. Eldridge DaceVaranasi  SUBJECTIVE  The patient denies nausea, vomiting, fever, chest pain, palpitations, shortness of breath, orthopnea, PND, dizziness, syncope, cough, congestion, abdominal pain, hematochezia, melena, lower extremity edema.  CURRENT MEDS . amLODipine  10 mg Oral Daily  . aspirin EC  81 mg Oral Daily  . clopidogrel  75 mg Oral Daily  . irbesartan  300 mg Oral Daily   And  . hydrochlorothiazide  25 mg Oral Daily  . levothyroxine  100 mcg Oral QAC breakfast  . pantoprazole  20 mg Oral Daily  . pravastatin  20 mg Oral q1800  . sodium chloride flush  3 mL Intravenous Q12H    OBJECTIVE  Vitals:   08/31/16 1730 08/31/16 1800 08/31/16 1916 09/01/16 0424  BP: (!) 138/54 (!) 123/42 131/66 (!) 132/50  Pulse: 85 89 91 84  Resp: 19 17 20 16   Temp:   98.1 F (36.7 C) 97 F (36.1 C)  TempSrc:   Oral Axillary  SpO2: 98% 96% 97% 97%  Weight:    174 lb 2.6 oz (79 kg)  Height:        Intake/Output Summary (Last 24 hours) at 09/01/16 0722 Last data filed at 09/01/16 [id number]  Gross per 24 hour  Intake          1416.33 ml  Output             2750 ml  Net         -1333.67 ml   Filed Weights   08/31/16 0642 09/01/16 0424  Weight: 160 lb (72.6 kg) 174 lb 2.6 oz (79 kg)    PHYSICAL EXAM  General: Pleasant, NAD. Neuro: Alert and oriented X 3. Moves all extremities spontaneously. Psych: Normal affect. HEENT:  Normal  Neck: Supple without bruits or JVD. Lungs:  Resp regular and unlabored, CTA. Heart: RRR no s3, s4, or murmurs. Abdomen: Soft, non-tender, non-distended, BS + x 4.  Extremities: No clubbing, cyanosis or edema. DP/PT/Radials 2+ and equal bilaterally. R radial cath site without hematoma.   Accessory Clinical Findings  CBC No results for input(s): WBC, NEUTROABS, HGB, HCT, MCV, PLT in the last 72 hours. Basic Metabolic Panel No results for input(s): NA, K, CL, CO2, GLUCOSE, BUN,  CREATININE, CALCIUM, MG, PHOS in the last 72 hours. Liver Function Tests No results for input(s): AST, ALT, ALKPHOS, BILITOT, PROT, ALBUMIN in the last 72 hours. No results for input(s): LIPASE, AMYLASE in the last 72 hours. Cardiac Enzymes No results for input(s): CKTOTAL, CKMB, CKMBINDEX, TROPONINI in the last 72 hours. BNP Invalid input(s): POCBNP D-Dimer No results for input(s): DDIMER in the last 72 hours. Hemoglobin A1C No results for input(s): HGBA1C in the last 72 hours. Fasting Lipid Panel No results for input(s): CHOL, HDL, LDLCALC, TRIG, CHOLHDL, LDLDIRECT in the last 72 hours. Thyroid Function Tests No results for input(s): TSH, T4TOTAL, T3FREE, THYROIDAB in the last 72 hours.  Invalid input(s): FREET3  TELE  Sinus rhythm   Cath 08/31/16 Coronary Stent Intervention  Left Heart Cath and Coronary Angiography  Conclusion     The left ventricular systolic function is normal.  LV end diastolic pressure is normal.  The left ventricular ejection fraction is 55-65% by visual estimate.  There is no aortic valve stenosis.  Prox Cx lesion, 95 %stenosed. A STENT XIENCE ALPINE RX 3.5X15 drug eluting stent was successfully placed, postdilated to 3.8 mm.  Post intervention, there is a 0% residual stenosis.  Continue dual antiplatelet therapy for at least a year.  She has been on Plavix chronically.  Continue aggressive secondary prevention.  Anticipate discharge in AM.      Radiology/Studies  No results found.  ASSESSMENT AND PLAN Active Problems:   Angina pectoris (HCC)   S/p PTCA & DES to pro Cx (STENT XIENCE ALPINE RX 3.5X15 ). Continue ASA, plavix and statin. Hold metformin 48 ours post cath. Ambulate and discharge later today. Pending lab work this morning.   Signed, Bhagat,Bhavinkumar PA-C Pager [phone]  History and all data above reviewed.  Patient examined.  I agree with the findings as above. No complaints.  Ambulated.  No SOB.   The patient exam  reveals COR:RRR  ,  Lungs: Clear  ,  Abd: Positive bowel sounds, no rebound no guarding , Ext Right wrist with mild bruising but no bleeding  .  All available labs, radiology testing, previous records reviewed. Agree with documented assessment and plan. OK to discharge.  Meds as listed.   Rollene Rotunda  7:43 AM  09/01/2016

## 2016-09-05 ENCOUNTER — Telehealth: Payer: Self-pay | Admitting: Interventional Cardiology

## 2016-09-05 NOTE — Telephone Encounter (Signed)
New message  Pt call requesting to speak with RN. Pt states she got a heart cath 9/29. Pt is c/o chest tightness and would like further instruction. Please call back to discuss

## 2016-09-05 NOTE — Telephone Encounter (Signed)
I spoke with pt. She c/o intermittent mid CP. She denies SOB, nausea, vomiting, or weakness. She states pain is not worse than before cath.  She states she has not taken NTG. I advised her to take NTG, no more than 3. I also advised her if she starts having symptoms as listed above to call 911. I sch appt with Boyce MediciBrittany Simmons tomorrow @ 0900. She voiced understanding and agreed with plan.

## 2016-09-06 ENCOUNTER — Encounter: Payer: Self-pay | Admitting: Cardiology

## 2016-09-06 ENCOUNTER — Ambulatory Visit (INDEPENDENT_AMBULATORY_CARE_PROVIDER_SITE_OTHER): Payer: Medicare Other | Admitting: Cardiology

## 2016-09-06 VITALS — BP 142/68 | HR 70 | Ht 63.5 in | Wt 158.1 lb

## 2016-09-06 DIAGNOSIS — Z5181 Encounter for therapeutic drug level monitoring: Secondary | ICD-10-CM

## 2016-09-06 MED ORDER — METOPROLOL TARTRATE 25 MG PO TABS: 12.5000 mg | ORAL_TABLET | Freq: Two times a day (BID) | ORAL | 5 refills | Status: DC

## 2016-09-06 NOTE — Patient Instructions (Signed)
Medication Instructions:   START TAKING METOPROLOL 12.5 MG TWICE A DAY   If you need a refill on your cardiac medications before your next appointment, please call your pharmacy.  Labwork: P2Y12    Testing/Procedures: NONE ORDER TODAY    Follow-Up: IN ONE TO TWO WEEKS WITH BRITTAINY SIMMONS   Any Other Special Instructions Will Be Listed Below (If Applicable).
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
## 2016-09-07 NOTE — Addendum Note (Signed)
Addended by: Chana BodeGREEN, Jaeveon Ashland L on: 09/07/2016 01:39 PM   Modules accepted: Orders

## 2016-09-18 ENCOUNTER — Ambulatory Visit: Payer: Medicare Other | Admitting: Cardiology

## 2016-09-20 ENCOUNTER — Telehealth: Payer: Self-pay | Admitting: *Deleted

## 2016-09-20 NOTE — Telephone Encounter (Signed)
Patient called and claimed that the pharmacy put a sig of take two tablets daily on the metoprolol and per insurance it is too soon to refill it. She was given a quantity of thirty. Patient stated that if it is to soon to refill it then she just won't take it. I made her aware that if she did not take it as prescribed by the provider then she would need to pay out of pocket until the insurance would pay again. I called the pharmacy and was told that this was incorrect. They claim that the sig was printed as the rx was sent in, take 0.5 tablets (12.5 mg) bid. Sending as an FYI as the patient has been taking 25 mg bid.

## 2016-09-20 NOTE — Telephone Encounter (Signed)
Spoke with pharmacy.  They have already spoken with patient and filled rx for 7 days until new month can be filled and covered through insurance.  Patient was made aware by pharmacy that week supply would cost her less than $4 and pt is agreeable to paying for it.

## 2016-09-26 ENCOUNTER — Ambulatory Visit (INDEPENDENT_AMBULATORY_CARE_PROVIDER_SITE_OTHER): Payer: Medicare Other | Admitting: Cardiology

## 2016-09-26 ENCOUNTER — Encounter: Payer: Self-pay | Admitting: Cardiology

## 2016-09-26 ENCOUNTER — Encounter (INDEPENDENT_AMBULATORY_CARE_PROVIDER_SITE_OTHER): Payer: Self-pay

## 2016-09-26 VITALS — BP 124/60 | HR 67 | Ht 63.5 in | Wt 160.8 lb

## 2016-09-26 DIAGNOSIS — I2510 Atherosclerotic heart disease of native coronary artery without angina pectoris: Secondary | ICD-10-CM | POA: Diagnosis not present

## 2016-09-26 DIAGNOSIS — Z9861 Coronary angioplasty status: Secondary | ICD-10-CM

## 2016-09-26 MED ORDER — METOPROLOL TARTRATE 25 MG PO TABS: 12.5000 mg | ORAL_TABLET | Freq: Two times a day (BID) | ORAL | 1 refills | Status: DC

## 2016-09-26 MED ORDER — PANTOPRAZOLE SODIUM 40 MG PO TBEC: 40.0000 mg | DELAYED_RELEASE_TABLET | Freq: Every day | ORAL | 1 refills | Status: DC

## 2016-09-26 NOTE — Patient Instructions (Signed)
Medication Instructions:  Your physician recommends that you continue on your current medications as directed. Please refer to the Current Medication list given to you today.   Labwork: None ordered  Testing/Procedures: None ordered  Follow-Up: Your physician recommends that you schedule a follow-up appointment in: January 2018 with Dr.Varanasi   Any Other Special Instructions Will Be Listed Below (If Applicable).     If you need a refill on your cardiac medications before your next appointment, please call your pharmacy.
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
## 2016-11-15 ENCOUNTER — Encounter: Payer: Self-pay | Admitting: Cardiology

## 2016-12-03 HISTORY — PX: KNEE SURGERY: SHX244

## 2016-12-05 ENCOUNTER — Ambulatory Visit (INDEPENDENT_AMBULATORY_CARE_PROVIDER_SITE_OTHER): Payer: Medicare Other | Admitting: Cardiology

## 2016-12-05 ENCOUNTER — Encounter: Payer: Self-pay | Admitting: Cardiology

## 2016-12-05 VITALS — BP 124/68 | HR 79

## 2016-12-05 DIAGNOSIS — I2510 Atherosclerotic heart disease of native coronary artery without angina pectoris: Secondary | ICD-10-CM

## 2016-12-05 MED ORDER — METOPROLOL TARTRATE 25 MG PO TABS: 12.5000 mg | ORAL_TABLET | Freq: Two times a day (BID) | ORAL | 3 refills | Status: DC
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
## 2016-12-05 NOTE — Patient Instructions (Signed)
Medication Instructions:  None  Labwork: None  Testing/Procedures: None  Follow-Up: Your physician wants you to follow-up in: 3-4 months with Dr. Eldridge DaceVaranasi.  You will receive a reminder letter in the mail two months in advance. If you don't receive a letter, please call our office to schedule the follow-up appointment.   Any Other Special Instructions Will Be Listed Below (If Applicable).     If you need a refill on your cardiac medications before your next appointment, please call your pharmacy.

## 2017-01-10 ENCOUNTER — Other Ambulatory Visit: Payer: Self-pay | Admitting: Internal Medicine

## 2017-01-10 DIAGNOSIS — Z1231 Encounter for screening mammogram for malignant neoplasm of breast: Secondary | ICD-10-CM
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
## 2017-02-15 ENCOUNTER — Encounter: Payer: Self-pay | Admitting: Interventional Cardiology

## 2017-02-15 ENCOUNTER — Ambulatory Visit (INDEPENDENT_AMBULATORY_CARE_PROVIDER_SITE_OTHER): Payer: Medicare Other | Admitting: Interventional Cardiology

## 2017-02-15 ENCOUNTER — Encounter (INDEPENDENT_AMBULATORY_CARE_PROVIDER_SITE_OTHER): Payer: Self-pay

## 2017-02-15 VITALS — BP 142/70 | HR 64 | Ht 63.5 in | Wt 158.4 lb

## 2017-02-15 DIAGNOSIS — I2510 Atherosclerotic heart disease of native coronary artery without angina pectoris: Secondary | ICD-10-CM | POA: Diagnosis not present

## 2017-02-15 DIAGNOSIS — Z9861 Coronary angioplasty status: Secondary | ICD-10-CM

## 2017-02-15 DIAGNOSIS — I119 Hypertensive heart disease without heart failure: Secondary | ICD-10-CM | POA: Diagnosis not present

## 2017-02-15 DIAGNOSIS — I25118 Atherosclerotic heart disease of native coronary artery with other forms of angina pectoris: Secondary | ICD-10-CM

## 2017-02-15 DIAGNOSIS — E782 Mixed hyperlipidemia: Secondary | ICD-10-CM

## 2017-02-15 MED ORDER — METOPROLOL TARTRATE 25 MG PO TABS: 25.0000 mg | ORAL_TABLET | ORAL | 3 refills | Status: DC

## 2017-02-15 NOTE — Patient Instructions (Addendum)
Medication Instructions:  1. CHANGE METOPROLOL TARTRATE TO 12.5 MG IN THE MORNING AND 25 MG IN THE PM  Labwork: NONE  Testing/Procedures: NONE  Follow-Up: DR. VARANASI 05/31/17 @ 10:40 AM   Any Other Special Instructions Will Be Listed Below (If Applicable).     If you need a refill on your cardiac medications before your next appointment, please call your pharmacy.

## 2017-03-12 ENCOUNTER — Ambulatory Visit (HOSPITAL_COMMUNITY)
Admission: RE | Admit: 2017-03-12 | Discharge: 2017-03-12 | Disposition: A | Discharge status: Home / Self Care | Payer: Self-pay | Source: Ambulatory Visit | Admitting: Radiology

## 2017-03-12 ENCOUNTER — Ambulatory Visit
Admission: RE | Admit: 2017-03-12 | Discharge: 2017-03-12 | Disposition: A | Discharge status: Home / Self Care | Payer: Medicare Other | Source: Ambulatory Visit | Attending: Internal Medicine | Admitting: Internal Medicine

## 2017-03-12 DIAGNOSIS — Z1231 Encounter for screening mammogram for malignant neoplasm of breast: Secondary | ICD-10-CM

## 2017-04-22 ENCOUNTER — Other Ambulatory Visit: Payer: Self-pay | Admitting: Internal Medicine

## 2017-04-22 DIAGNOSIS — M25561 Pain in right knee: Secondary | ICD-10-CM

## 2017-04-22 DIAGNOSIS — M25562 Pain in left knee: Secondary | ICD-10-CM

## 2017-05-16 ENCOUNTER — Ambulatory Visit
Admission: RE | Admit: 2017-05-16 | Discharge: 2017-05-16 | Disposition: A | Discharge status: Home / Self Care | Payer: Medicare Other | Source: Ambulatory Visit | Attending: Internal Medicine | Admitting: Internal Medicine

## 2017-05-16 DIAGNOSIS — M25562 Pain in left knee: Secondary | ICD-10-CM

## 2017-05-22 ENCOUNTER — Telehealth: Payer: Self-pay

## 2017-05-22 NOTE — Telephone Encounter (Signed)
Since her stent was placed in 9/17, I would like to wait at least one year post stent before holding her Plavix for the surgery.  COuld it be postponed until 10/18?

## 2017-05-22 NOTE — Telephone Encounter (Signed)
Request for surgical clearance:  1. What type of surgery is being performed? Left Knee Arthroscopy   2. When is this surgery scheduled? Pending Clearance   3. Are there any medications that need to be held prior to surgery and how long? Plavix   4. Name of physician performing surgery? Dr. Luiz BlareGraves   5. What is your office phone and fax number? P- [phone], [phone]- [phone]

## 2017-05-24 ENCOUNTER — Telehealth: Payer: Self-pay | Admitting: Interventional Cardiology

## 2017-05-24 NOTE — Telephone Encounter (Signed)
New message    Pt is calling with a question about her plavix. Please call.

## 2017-05-24 NOTE — Telephone Encounter (Signed)
Returned call to patient-following up on surgical clearance and Plavix.  Advised of Dr. Eldridge DaceVaranasi recommendations:  Corky CraftsVaranasi, Jayadeep S, MD   05/22/17 5:38 PM  Note    Since her stent was placed in 9/17, I would like to wait at least one year post stent before holding her Plavix for the surgery.  COuld it be postponed until 10/18?       Patient aware of recommendations and verbalized understanding of risk involved with holding Plavix, will follow up with Dr. Luiz BlareGraves.

## 2017-05-27 NOTE — Telephone Encounter (Signed)
Barbara ReiningNicole [facility] is requesting cardiac clearance for patient. Dr. Luiz BlareGraves if fine with patient not holding her plavix. Patient is scheduled for surgery on 05/29/17. Will forward to Dr. Eldridge DaceVaranasi for advisement.

## 2017-05-27 NOTE — Telephone Encounter (Signed)
New message     Barbara Carroll [facility] called wants to speak with someone asap, they would like to proceed with surgery due to patient being in pain.

## 2017-05-28 ENCOUNTER — Other Ambulatory Visit: Payer: Self-pay | Admitting: Orthopedic Surgery

## 2017-05-28 ENCOUNTER — Encounter (HOSPITAL_COMMUNITY): Payer: Self-pay | Admitting: *Deleted

## 2017-05-28 NOTE — Telephone Encounter (Signed)
Corky CraftsVaranasi, Jayadeep S, MD  You; Leigh Auroraooke, Tanya R, LPN Just now (16:1011:01 AM)      Patient is now symptom free with medication adjustment.  No further cardiac testing needed before surgery.  Per ortho, they will continue Plavix during the surgery.

## 2017-05-28 NOTE — Telephone Encounter (Signed)
Patient is now symptom free with medication adjustment.  No further cardiac testing needed before surgery.  Per ortho, they will continue Plavix during the surgery.

## 2017-05-28 NOTE — Progress Notes (Signed)
Barbara Carroll denies chest pain or shortness of breath.  Patient is on Aspirin and Plavix for stent that was place 08/2016, patient was instructed to continue to take.  Barbara Carroll's CBG machine is broken, she reports thatshe feels like her blood sugar drops during the day and she eats a piece of candy. Instructed patient to eat a snack that has protein in it but will digest at bedtime tonight.  I instructed patient that if she begins to feel like blood sugar is dropping to drink 1/2 cup if Regular Sprite (not diet and patient does not have any clear juice) and to call the pre op desk and tell them that she feels like sugar is dropping and when she drank the  Sprite and ask for further instructions.

## 2017-05-29 ENCOUNTER — Encounter (HOSPITAL_COMMUNITY)
Admission: RE | Disposition: A | Discharge status: Home / Self Care | Payer: Self-pay | Source: Ambulatory Visit | Attending: Orthopedic Surgery

## 2017-05-29 ENCOUNTER — Ambulatory Visit (HOSPITAL_COMMUNITY): Payer: Medicare Other | Admitting: Anesthesiology

## 2017-05-29 ENCOUNTER — Encounter (HOSPITAL_COMMUNITY): Payer: Self-pay | Admitting: General Practice

## 2017-05-29 ENCOUNTER — Ambulatory Visit (HOSPITAL_COMMUNITY)
Admission: RE | Admit: 2017-05-29 | Discharge: 2017-05-29 | Disposition: A | Discharge status: Home / Self Care | Payer: Medicare Other | Source: Ambulatory Visit | Attending: Orthopedic Surgery | Admitting: Orthopedic Surgery

## 2017-05-29 DIAGNOSIS — I25119 Atherosclerotic heart disease of native coronary artery with unspecified angina pectoris: Secondary | ICD-10-CM | POA: Insufficient documentation

## 2017-05-29 DIAGNOSIS — K219 Gastro-esophageal reflux disease without esophagitis: Secondary | ICD-10-CM | POA: Diagnosis not present

## 2017-05-29 DIAGNOSIS — M94262 Chondromalacia, left knee: Secondary | ICD-10-CM | POA: Diagnosis present

## 2017-05-29 DIAGNOSIS — M2242 Chondromalacia patellae, left knee: Secondary | ICD-10-CM | POA: Diagnosis not present

## 2017-05-29 DIAGNOSIS — E119 Type 2 diabetes mellitus without complications: Secondary | ICD-10-CM | POA: Diagnosis not present

## 2017-05-29 DIAGNOSIS — R200 Anesthesia of skin: Secondary | ICD-10-CM | POA: Insufficient documentation

## 2017-05-29 DIAGNOSIS — M47812 Spondylosis without myelopathy or radiculopathy, cervical region: Secondary | ICD-10-CM | POA: Insufficient documentation

## 2017-05-29 DIAGNOSIS — S83242A Other tear of medial meniscus, current injury, left knee, initial encounter: Secondary | ICD-10-CM | POA: Insufficient documentation

## 2017-05-29 DIAGNOSIS — M1990 Unspecified osteoarthritis, unspecified site: Secondary | ICD-10-CM | POA: Insufficient documentation

## 2017-05-29 DIAGNOSIS — F419 Anxiety disorder, unspecified: Secondary | ICD-10-CM | POA: Insufficient documentation

## 2017-05-29 DIAGNOSIS — Z7984 Long term (current) use of oral hypoglycemic drugs: Secondary | ICD-10-CM | POA: Insufficient documentation

## 2017-05-29 DIAGNOSIS — Z888 Allergy status to other drugs, medicaments and biological substances status: Secondary | ICD-10-CM | POA: Diagnosis not present

## 2017-05-29 DIAGNOSIS — M6752 Plica syndrome, left knee: Secondary | ICD-10-CM | POA: Insufficient documentation

## 2017-05-29 DIAGNOSIS — E785 Hyperlipidemia, unspecified: Secondary | ICD-10-CM | POA: Insufficient documentation

## 2017-05-29 DIAGNOSIS — S83282A Other tear of lateral meniscus, current injury, left knee, initial encounter: Secondary | ICD-10-CM | POA: Diagnosis not present

## 2017-05-29 DIAGNOSIS — Z79899 Other long term (current) drug therapy: Secondary | ICD-10-CM | POA: Insufficient documentation

## 2017-05-29 DIAGNOSIS — Z800 Family history of malignant neoplasm of digestive organs: Secondary | ICD-10-CM | POA: Insufficient documentation

## 2017-05-29 DIAGNOSIS — E039 Hypothyroidism, unspecified: Secondary | ICD-10-CM | POA: Insufficient documentation

## 2017-05-29 DIAGNOSIS — G4730 Sleep apnea, unspecified: Secondary | ICD-10-CM | POA: Insufficient documentation

## 2017-05-29 DIAGNOSIS — I110 Hypertensive heart disease with heart failure: Secondary | ICD-10-CM | POA: Insufficient documentation

## 2017-05-29 DIAGNOSIS — I69398 Other sequelae of cerebral infarction: Secondary | ICD-10-CM | POA: Diagnosis not present

## 2017-05-29 DIAGNOSIS — Z7902 Long term (current) use of antithrombotics/antiplatelets: Secondary | ICD-10-CM | POA: Insufficient documentation

## 2017-05-29 DIAGNOSIS — I509 Heart failure, unspecified: Secondary | ICD-10-CM | POA: Insufficient documentation

## 2017-05-29 DIAGNOSIS — Z87891 Personal history of nicotine dependence: Secondary | ICD-10-CM | POA: Insufficient documentation

## 2017-05-29 DIAGNOSIS — Z7982 Long term (current) use of aspirin: Secondary | ICD-10-CM | POA: Insufficient documentation

## 2017-05-29 DIAGNOSIS — X501XXA Overexertion from prolonged static or awkward postures, initial encounter: Secondary | ICD-10-CM | POA: Insufficient documentation

## 2017-05-29 DIAGNOSIS — M8580 Other specified disorders of bone density and structure, unspecified site: Secondary | ICD-10-CM | POA: Insufficient documentation

## 2017-05-29 DIAGNOSIS — Z887 Allergy status to serum and vaccine status: Secondary | ICD-10-CM | POA: Diagnosis not present

## 2017-05-29 DIAGNOSIS — F329 Major depressive disorder, single episode, unspecified: Secondary | ICD-10-CM | POA: Diagnosis not present

## 2017-05-29 HISTORY — PX: KNEE ARTHROSCOPY WITH LATERAL MENISECTOMY: SHX6193

## 2017-05-29 HISTORY — DX: Unspecified intracranial injury with loss of consciousness of unspecified duration, initial encounter: S06.9X9A

## 2017-05-29 HISTORY — DX: Personal history of urinary calculi: Z87.442

## 2017-05-29 HISTORY — PX: CHONDROPLASTY: SHX5177

## 2017-05-29 HISTORY — DX: Personal history of other medical treatment: Z92.89

## 2017-05-29 HISTORY — PX: KNEE ARTHROSCOPY: SHX127

## 2017-05-29 LAB — BASIC METABOLIC PANEL
Anion gap: 8 (ref 5–15)
BUN: 18 mg/dL (ref 6–20)
CHLORIDE: 106 mmol/L (ref 101–111)
CO2: 24 mmol/L (ref 22–32)
CREATININE: 0.71 mg/dL (ref 0.44–1.00)
Calcium: 9.8 mg/dL (ref 8.9–10.3)
GFR calc Af Amer: >60 mL/min (ref >60)
Glucose, Bld: 107 mg/dL — ABNORMAL HIGH (ref 65–99)
Potassium: 3.9 mmol/L (ref 3.5–5.1)
SODIUM: 138 mmol/L (ref 135–145)

## 2017-05-29 LAB — GLUCOSE, CAPILLARY
GLUCOSE-CAPILLARY: 107 mg/dL — AB (ref 65–99)
Glucose-Capillary: 104 mg/dL — ABNORMAL HIGH (ref 65–99)

## 2017-05-29 LAB — CBC
HCT: 38.6 % (ref 36.0–46.0)
Hemoglobin: 12.6 g/dL (ref 12.0–15.0)
MCH: 29 pg (ref 26.0–34.0)
MCHC: 32.6 g/dL (ref 30.0–36.0)
MCV: 88.9 fL (ref 78.0–100.0)
PLATELETS: 305 10*3/uL (ref 150–400)
RBC: 4.34 MIL/uL (ref 3.87–5.11)
RDW: 13.1 % (ref 11.5–15.5)
WBC: 7.6 10*3/uL (ref 4.0–10.5)

## 2017-05-29 SURGERY — ARTHROSCOPY, KNEE
Anesthesia: General | Site: Knee | Laterality: Left

## 2017-05-29 MED ORDER — BUPIVACAINE HCL (PF) 0.25 % IJ SOLN
INTRAMUSCULAR | Status: DC | PRN
  Administered 2017-05-29: 20 mL

## 2017-05-29 MED ORDER — CEFAZOLIN SODIUM-DEXTROSE 2-4 GM/100ML-% IV SOLN
2.0000 g | INTRAVENOUS | Status: AC
  Administered 2017-05-29: 2 g via INTRAVENOUS
  Filled 2017-05-29: qty 100

## 2017-05-29 MED ORDER — CHLORHEXIDINE GLUCONATE 4 % EX LIQD: 60.0000 mL | Freq: Once | CUTANEOUS | Status: DC

## 2017-05-29 MED ORDER — HYDROMORPHONE HCL 1 MG/ML IJ SOLN
0.2500 mg | INTRAMUSCULAR | Status: DC | PRN
  Administered 2017-05-29 (×2): 0.5 mg via INTRAVENOUS

## 2017-05-29 MED ORDER — PROPOFOL 10 MG/ML IV BOLUS
INTRAVENOUS | Status: AC
  Filled 2017-05-29: qty 20

## 2017-05-29 MED ORDER — ONDANSETRON HCL 4 MG/2ML IJ SOLN
INTRAMUSCULAR | Status: DC | PRN
  Administered 2017-05-29: 4 mg via INTRAVENOUS

## 2017-05-29 MED ORDER — SCOPOLAMINE 1 MG/3DAYS TD PT72
1.0000 | MEDICATED_PATCH | TRANSDERMAL | Status: DC
  Administered 2017-05-29: 1.5 mg via TRANSDERMAL

## 2017-05-29 MED ORDER — SCOPOLAMINE 1 MG/3DAYS TD PT72
MEDICATED_PATCH | TRANSDERMAL | Status: AC
  Filled 2017-05-29: qty 1

## 2017-05-29 MED ORDER — BUPIVACAINE HCL (PF) 0.25 % IJ SOLN
INTRAMUSCULAR | Status: AC
  Filled 2017-05-29: qty 30

## 2017-05-29 MED ORDER — PROPOFOL 10 MG/ML IV BOLUS
INTRAVENOUS | Status: DC | PRN
  Administered 2017-05-29: 50 mg via INTRAVENOUS
  Administered 2017-05-29: 120 mg via INTRAVENOUS

## 2017-05-29 MED ORDER — EPHEDRINE 5 MG/ML INJ
INTRAVENOUS | Status: AC
  Filled 2017-05-29: qty 10

## 2017-05-29 MED ORDER — FENTANYL CITRATE (PF) 250 MCG/5ML IJ SOLN
INTRAMUSCULAR | Status: AC
Start: 2017-05-29 — End: ?
  Filled 2017-05-29: qty 5

## 2017-05-29 MED ORDER — EPINEPHRINE PF 1 MG/ML IJ SOLN
INTRAMUSCULAR | Status: AC
  Filled 2017-05-29: qty 1

## 2017-05-29 MED ORDER — HYDROMORPHONE HCL 1 MG/ML IJ SOLN
INTRAMUSCULAR | Status: AC
  Filled 2017-05-29: qty 1

## 2017-05-29 MED ORDER — PROMETHAZINE HCL 25 MG/ML IJ SOLN
INTRAMUSCULAR | Status: AC
  Filled 2017-05-29: qty 1

## 2017-05-29 MED ORDER — SODIUM CHLORIDE 0.9 % IR SOLN
Status: DC | PRN
  Administered 2017-05-29 (×2): 3000 mL

## 2017-05-29 MED ORDER — PHENYLEPHRINE 40 MCG/ML (10ML) SYRINGE FOR IV PUSH (FOR BLOOD PRESSURE SUPPORT)
PREFILLED_SYRINGE | INTRAVENOUS | Status: AC
  Filled 2017-05-29: qty 10

## 2017-05-29 MED ORDER — LIDOCAINE HCL (CARDIAC) 20 MG/ML IV SOLN
INTRAVENOUS | Status: DC | PRN
  Administered 2017-05-29: 60 mg via INTRAVENOUS

## 2017-05-29 MED ORDER — LACTATED RINGERS IV SOLN
INTRAVENOUS | Status: DC
  Administered 2017-05-29: 11:00:00 via INTRAVENOUS

## 2017-05-29 MED ORDER — FENTANYL CITRATE (PF) 100 MCG/2ML IJ SOLN
INTRAMUSCULAR | Status: DC | PRN
  Administered 2017-05-29 (×3): 50 ug via INTRAVENOUS

## 2017-05-29 MED ORDER — HYDROCODONE-ACETAMINOPHEN 5-325 MG PO TABS: 1.0000 | ORAL_TABLET | Freq: Four times a day (QID) | ORAL | 0 refills | Status: DC | PRN

## 2017-05-29 MED ORDER — LACTATED RINGERS IV SOLN
INTRAVENOUS | Status: DC | PRN
  Administered 2017-05-29: 12:00:00 via INTRAVENOUS

## 2017-05-29 MED ORDER — PROMETHAZINE HCL 25 MG/ML IJ SOLN
6.2500 mg | INTRAMUSCULAR | Status: DC | PRN
  Administered 2017-05-29: 6.25 mg via INTRAVENOUS

## 2017-05-29 MED ORDER — BUPIVACAINE HCL (PF) 0.5 % IJ SOLN
INTRAMUSCULAR | Status: AC
Start: 2017-05-29 — End: ?
  Filled 2017-05-29: qty 30

## 2017-05-29 MED ORDER — EPINEPHRINE PF 1 MG/ML IJ SOLN
INTRAMUSCULAR | Status: DC | PRN
  Administered 2017-05-29 (×2): 1 mg

## 2017-05-29 SURGICAL SUPPLY — 39 items
BANDAGE ACE 6X5 VEL STRL LF (GAUZE/BANDAGES/DRESSINGS) ×2 IMPLANT
BLADE CUDA 5.5 (BLADE) IMPLANT
BLADE CUTTER GATOR 3.5 (BLADE) IMPLANT
BLADE GREAT WHITE 4.2 (BLADE) ×2 IMPLANT
BNDG GAUZE ELAST 4 BULKY (GAUZE/BANDAGES/DRESSINGS) ×2 IMPLANT
CUFF TOURNIQUET SINGLE 34IN LL (TOURNIQUET CUFF) IMPLANT
CUFF TOURNIQUET SINGLE 44IN (TOURNIQUET CUFF) IMPLANT
DRAPE ARTHROSCOPY W/POUCH 114 (DRAPES) ×2 IMPLANT
DRAPE HALF SHEET 40X57 (DRAPES) ×2 IMPLANT
DRAPE U-SHAPE 47X51 STRL (DRAPES) ×2 IMPLANT
DRSG EMULSION OIL 3X3 NADH (GAUZE/BANDAGES/DRESSINGS) ×2 IMPLANT
DRSG PAD ABDOMINAL 8X10 ST (GAUZE/BANDAGES/DRESSINGS) ×2 IMPLANT
DURAPREP 26ML APPLICATOR (WOUND CARE) ×2 IMPLANT
FILTER STRAW FLUID ASPIR (MISCELLANEOUS) ×2 IMPLANT
GAUZE SPONGE 4X4 12PLY STRL (GAUZE/BANDAGES/DRESSINGS) ×2 IMPLANT
GLOVE BIO SURGEON STRL SZ7.5 (GLOVE) ×2 IMPLANT
GLOVE BIOGEL PI IND STRL 7.5 (GLOVE) ×1 IMPLANT
GLOVE BIOGEL PI IND STRL 8 (GLOVE) ×3 IMPLANT
GLOVE BIOGEL PI INDICATOR 7.5 (GLOVE) ×1
GLOVE BIOGEL PI INDICATOR 8 (GLOVE) ×3
GLOVE ECLIPSE 7.5 STRL STRAW (GLOVE) ×8 IMPLANT
GOWN STRL REUS W/ TWL LRG LVL3 (GOWN DISPOSABLE) ×1 IMPLANT
GOWN STRL REUS W/ TWL XL LVL3 (GOWN DISPOSABLE) ×2 IMPLANT
GOWN STRL REUS W/TWL LRG LVL3 (GOWN DISPOSABLE) ×1
GOWN STRL REUS W/TWL XL LVL3 (GOWN DISPOSABLE) ×2
KIT BASIN OR (CUSTOM PROCEDURE TRAY) ×2 IMPLANT
KIT ROOM TURNOVER OR (KITS) ×2 IMPLANT
NEEDLE 18GX1X1/2 (RX/OR ONLY) (NEEDLE) ×2 IMPLANT
PACK ARTHROSCOPY DSU (CUSTOM PROCEDURE TRAY) ×2 IMPLANT
PAD ARMBOARD 7.5X6 YLW CONV (MISCELLANEOUS) ×4 IMPLANT
PAD CAST 4YDX4 CTTN HI CHSV (CAST SUPPLIES) ×2 IMPLANT
PADDING CAST COTTON 4X4 STRL (CAST SUPPLIES) ×2
SET ARTHROSCOPY TUBING (MISCELLANEOUS) ×1
SET ARTHROSCOPY TUBING LN (MISCELLANEOUS) ×1 IMPLANT
SUT ETHILON 4 0 PS 2 18 (SUTURE) ×2 IMPLANT
SYR 5ML LL (SYRINGE) ×2 IMPLANT
TOWEL OR 17X24 6PK STRL BLUE (TOWEL DISPOSABLE) ×2 IMPLANT
TOWEL OR 17X26 10 PK STRL BLUE (TOWEL DISPOSABLE) ×2 IMPLANT
WRAP KNEE MAXI GEL POST OP (GAUZE/BANDAGES/DRESSINGS) ×2 IMPLANT
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
## 2017-05-29 NOTE — Discharge Instructions (Signed)
POST-OP KNEE ARTHROSCOPY INSTRUCTIONS  °Dr. John Graves/Jim Texas Oborn PA-C ° °Pain °You will be expected to have a moderate amount of pain in the affected knee for approximately two weeks. However, the first two days will be the most severe pain. A prescription has been provided to take as needed for the pain. The pain can be reduced by applying ice packs to the knee for the first 1-2 weeks post surgery. Also, keeping the leg elevated on pillows will help alleviate the pain. If you develop any acute pain or swelling in your calf muscle, please call the doctor. ° °Activity °It is preferred that you stay at bed rest for approximately 24 hours. However, you may go to the bathroom with help. Weight bearing as tolerated. You may begin the knee exercises the day of surgery. Discontinue crutches as the knee pain resolves. ° °Dressing °Keep the dressing dry. If the ace bandage should wrinkle or roll up, this can be rewrapped to prevent ridges in the bandage. You may remove all dressings in 48 hours,  apply bandaids to each wound. You may shower on the 4th day after surgery but no tub bath. ° °Symptoms to report to your doctor °Extreme pain °Extreme swelling °Temperature above 101 degrees °Change in the feeling, color, or movement of your toes °Redness, heat, or swelling at your incision ° °Exercise °If is preferred that as soon as possible you try to do a straight leg raise without bending the knee and concentrate on bringing the heel of your foot off the bed up to approximately 45 degrees and hold for the count of 10 seconds. Repeat this at least 10 times three or four times per day. Additional exercises are provided below. ° °You are encouraged to bend the knee as tolerated. ° °Follow-Up °Call to schedule a follow-up appointment in 5-7 days. Our office # is [phone]. ° °POST-OP EXERCISES ° °Short Arc Quads ° °1. Lie on back with legs straight. Place towel roll under thigh, just above knee. °2. Tighten thigh muscles to  straighten knee and lift heel off bed. °3. Hold for slow count of five, then lower. °4. Do three sets of ten ° ° ° °Straight Leg Raises ° °1. Lie on back with operative leg straight and other leg bent. °2. Keeping operative leg completely straight, slowly lift operative leg so foot is 5 inches off bed. °3. Hold for slow count of five, then lower. °4. Do three sets of ten. ° ° ° °DO BOTH EXERCISES 2 TIMES A DAY ° °Ankle Pumps ° °Work/move the operative ankle and foot up and down 10 times every hour while awake. °

## 2017-05-29 NOTE — Transfer of Care (Signed)
Immediate Anesthesia Transfer of Care Note  Patient: Sherle PoeCarol S Freedman  Procedure(s) Performed: Procedure(s): ARTHROSCOPY KNEE (Left) CHONDROPLASTY MEDIAL FEMORAL (Left) KNEE ARTHROSCOPY WITH PARTIAL LATERAL AND  MENISECTOMY (Left)  Patient Location: PACU  Anesthesia Type:General  Level of Consciousness: awake, alert  and oriented  Airway & Oxygen Therapy: Patient Spontanous Breathing and Patient connected to nasal cannula oxygen  Post-op Assessment: Report given to RN, Post -op Vital signs reviewed and stable and Patient moving all extremities X 4  Post vital signs: Reviewed and stable  Last Vitals:  Vitals:   05/29/17 1037  BP: (!) 157/51  Pulse: 64  Resp: 20  Temp: 36.6 C    Last Pain:  Vitals:   05/29/17 1127  TempSrc:   PainSc: 5       Patients Stated Pain Goal: 2 (05/29/17 1127)  Complications: No apparent anesthesia complications

## 2017-05-29 NOTE — Anesthesia Preprocedure Evaluation (Addendum)
Anesthesia Evaluation  Patient identified by MRN, date of birth, ID band Patient awake    Reviewed: Allergy & Precautions, NPO status , Patient's Chart, lab work & pertinent test results  History of Anesthesia Complications (+) PONV and history of anesthetic complications  Airway Mallampati: II  TM Distance: >3 FB Neck ROM: Full    Dental no notable dental hx. (+) Dental Advisory Given, Poor Dentition   Pulmonary shortness of breath, sleep apnea , former smoker,    Pulmonary exam normal        Cardiovascular hypertension, + angina + CAD  Normal cardiovascular exam     Neuro/Psych  Headaches, PSYCHIATRIC DISORDERS Anxiety Depression CVA, Residual Symptoms    GI/Hepatic Neg liver ROS, GERD  ,  Endo/Other  diabetesHypothyroidism   Renal/GU negative Renal ROS     Musculoskeletal   Abdominal   Peds  Hematology   Anesthesia Other Findings   Reproductive/Obstetrics                            Anesthesia Physical Anesthesia Plan  ASA: III  Anesthesia Plan: General   Post-op Pain Management:    Induction: Intravenous  PONV Risk Score and Plan: 4 or greater and Ondansetron, Dexamethasone, Scopolamine patch - Pre-op and Diphenhydramine  Airway Management Planned: LMA  Additional Equipment:   Intra-op Plan:   Post-operative Plan: Extubation in OR  Informed Consent: I have reviewed the patients History and Physical, chart, labs and discussed the procedure including the risks, benefits and alternatives for the proposed anesthesia with the patient or authorized representative who has indicated his/her understanding and acceptance.   Dental advisory given and Dental Advisory Given  Plan Discussed with: CRNA, Anesthesiologist and Surgeon  Anesthesia Plan Comments:        Anesthesia Quick Evaluation

## 2017-05-29 NOTE — Anesthesia Postprocedure Evaluation (Signed)
Anesthesia Post Note  Patient: Barbara Carroll  Procedure(s) Performed: Procedure(s) (LRB): ARTHROSCOPY KNEE (Left) CHONDROPLASTY MEDIAL FEMORAL (Left) KNEE ARTHROSCOPY WITH PARTIAL LATERAL AND  MENISECTOMY (Left)     Patient location during evaluation: PACU Anesthesia Type: General Level of consciousness: sedated Pain management: pain level controlled Vital Signs Assessment: post-procedure vital signs reviewed and stable Respiratory status: spontaneous breathing and respiratory function stable Cardiovascular status: stable Anesthetic complications: no    Last Vitals:  Vitals:   05/29/17 1500 05/29/17 1515  BP: (!) 147/59 118/70  Pulse: 79 73  Resp: 16 15  Temp:      Last Pain:  Vitals:   05/29/17 1500  TempSrc:   PainSc: 4                  Jeaninne Lodico DANIEL

## 2017-05-29 NOTE — Op Note (Signed)
NAME:  Geffrard, Kimetha                      ACCOUNT NO.:  MEDICAL RECORD NO.:  [id number]  LOCATION:                                 FACILITY:  PHYSICIAN:  Harvie JuniorJohn L. Enslee Bibbins, M.D.        DATE OF BIRTH:  DATE OF PROCEDURE:  05/29/2017 DATE OF DISCHARGE:                              OPERATIVE REPORT   PREOPERATIVE DIAGNOSIS:  Medial meniscal tear.  POSTOPERATIVE DIAGNOSES: 1. Medial meniscal tear. 2. Lateral meniscal tear. 3. Chondromalacia, medial femoral condyle and patellofemoral joint.  PROCEDURES: 1. Partial medial and partial lateral meniscectomy. 2. Chondroplasty of patellofemoral joint down to bleeding bone where     necessary. 3. Chondroplasty of medial compartment down to bleeding bone where     necessary.  SURGEON:  Harvie JuniorJohn L. Karmina Zufall, M.D.  ASSISOrma Flaming:  Bethune.  ANESTHESIA:  General.  BRIEF HISTORY:  Ms. Tamala SerDitch is a 76 year old female with a significant history of left knee pain.  She had a twisting injury and began having worsening pain over the last month.  She has had some ongoing pain but not severe but after this new injury, she began having severe pain.  She was evaluated and noted to have narrowing on x-ray, not bone-on-bone change, had significant medial joint line tenderness.  MRI showed medial meniscal tear.  We felt that this was the most likely cause of her symptoms.  We talked about a knee replacement versus knee arthroscopy, she wished for knee arthroscopy, she was brought to the operating room for this procedure.  DESCRIPTION OF PROCEDURE:  The patient was brought to the operative room, and after adequate anesthesia was obtained with general anesthetic, the patient was placed supine on operating table.  The left leg was prepped and draped in usual sterile fashion.  Following this, routine arthroscopic examination of the knee revealed there was obvious chondromalacia of the patellofemoral joint.  This was debrided.  There was a large medial shelf plica which  was blocking entrance into the medial compartment which was debrided back to the rim of the capsule. Once this was done, we got in the medial compartment.  There was a complex tear of the medial meniscus in the midbody around posteriorly. This was debrided from the midbody around posteriorly.  There was a severe area probably 2 x 3 cm of exposed bone on the tibia and similar size lesion with some grooves actually on the distal femur.  Attention was turned to the ACL normal.  Attention turned over to the lateral side.  There was a small outer edge of lateral meniscal tear which was debrided.  Once that was completed, the knee was copiously and thoroughly lavaged and suctioned dry.  Final check was made for any loosen fragment or pieces, and again, the knee was irrigated and suctioned dry.  Once this was completed, the portals were closed with a bandage.  Sterile compressive dressing was applied.  A 20 mL of 1% Marcaine was instilled in the knee for postoperative anesthesia.  Sterile compressive dressing applied.  The patient was taken to the recovery room, she was noted to be in satisfactory condition.  Estimated blood loss  for procedure was minimal.     Harvie Junior, M.D.     Ranae Plumber  D:  05/29/2017  T:  05/29/2017  Job:  [id number]  cc:   Harvie Junior, M.D.

## 2017-05-29 NOTE — Anesthesia Procedure Notes (Signed)
Procedure Name: LMA Insertion Date/Time: 05/29/2017 1:11 PM Performed by: Carmela RimaMARTINELLI, Caison Hearn F Pre-anesthesia Checklist: Timeout performed, Patient being monitored, Suction available, Emergency Drugs available and Patient identified Patient Re-evaluated:Patient Re-evaluated prior to inductionOxygen Delivery Method: Circle system utilized Preoxygenation: Pre-oxygenation with 100% oxygen Intubation Type: IV induction Ventilation: Mask ventilation without difficulty LMA: LMA inserted LMA Size: 4.0 Tube type: Oral Placement Confirmation: breath sounds checked- equal and bilateral,  positive ETCO2 and ETT inserted through vocal cords under direct vision Tube secured with: Tape Dental Injury: Teeth and Oropharynx as per pre-operative assessment

## 2017-05-29 NOTE — Brief Op Note (Signed)
05/29/2017  1:42 PM  PATIENT:  Barbara Carroll  76 y.o. female  PRE-OPERATIVE DIAGNOSIS:  LEFT KNEE MEDIAL MENISCUS TEAR  POST-OPERATIVE DIAGNOSIS:  LEFT KNEE MEDIAL MENISCUS TEAR  PROCEDURE:  Procedure(s): ARTHROSCOPY KNEE (Left) CHONDROPLASTY PARTIAL MEDIAL (Left)  SURGEON:  Surgeon(s) and Role:    Jodi Geralds* Diahann Guajardo, MD - Primary  PHYSICIAN ASSISTANT:   ASSISTANTS: bethune   ANESTHESIA:   general  EBL:  No intake/output data recorded.  BLOOD ADMINISTERED:none  DRAINS: none   LOCAL MEDICATIONS USED:  MARCAINE     SPECIMEN:  No Specimen  DISPOSITION OF SPECIMEN:  N/A  COUNTS:  YES  TOURNIQUET:  * No tourniquets in log *  DICTATION: .Other Dictation: Dictation Number [id number]  PLAN OF CARE: Admit to inpatient   PATIENT DISPOSITION:  PACU - hemodynamically stable.   Delay start of Pharmacological VTE agent (>24hrs) due to surgical blood loss or risk of bleeding: no

## 2017-05-30 ENCOUNTER — Encounter (HOSPITAL_COMMUNITY): Payer: Self-pay | Admitting: Orthopedic Surgery

## 2017-05-31 ENCOUNTER — Ambulatory Visit: Payer: Medicare Other | Admitting: Interventional Cardiology

## 2017-06-13 ENCOUNTER — Other Ambulatory Visit: Payer: Self-pay | Admitting: Cardiology

## 2017-07-11 ENCOUNTER — Ambulatory Visit: Payer: Medicare Other | Admitting: Cardiology

## 2017-09-11 ENCOUNTER — Other Ambulatory Visit: Payer: Self-pay | Admitting: Interventional Cardiology
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
## 2017-09-12 ENCOUNTER — Encounter: Payer: Self-pay | Admitting: Interventional Cardiology

## 2017-09-12 ENCOUNTER — Encounter (INDEPENDENT_AMBULATORY_CARE_PROVIDER_SITE_OTHER): Payer: Self-pay

## 2017-09-12 ENCOUNTER — Ambulatory Visit (INDEPENDENT_AMBULATORY_CARE_PROVIDER_SITE_OTHER): Payer: Medicare Other | Admitting: Interventional Cardiology

## 2017-09-12 VITALS — BP 110/62 | HR 61 | Ht 62.0 in | Wt 160.2 lb

## 2017-09-12 DIAGNOSIS — I2510 Atherosclerotic heart disease of native coronary artery without angina pectoris: Secondary | ICD-10-CM

## 2017-09-12 DIAGNOSIS — Z9861 Coronary angioplasty status: Secondary | ICD-10-CM

## 2017-09-12 DIAGNOSIS — I25118 Atherosclerotic heart disease of native coronary artery with other forms of angina pectoris: Secondary | ICD-10-CM | POA: Diagnosis not present

## 2017-09-12 DIAGNOSIS — I119 Hypertensive heart disease without heart failure: Secondary | ICD-10-CM

## 2017-09-12 DIAGNOSIS — E782 Mixed hyperlipidemia: Secondary | ICD-10-CM | POA: Diagnosis not present

## 2017-09-12 DIAGNOSIS — E118 Type 2 diabetes mellitus with unspecified complications: Secondary | ICD-10-CM | POA: Diagnosis not present

## 2017-09-12 DIAGNOSIS — I209 Angina pectoris, unspecified: Secondary | ICD-10-CM

## 2017-09-12 MED ORDER — PREDNISONE 50 MG PO TABS: ORAL_TABLET | ORAL | 0 refills | Status: DC

## 2017-09-12 NOTE — Patient Instructions (Addendum)
Medication Instructions:  Your physician recommends that you continue on your current medications as directed. Please refer to the Current Medication list given to you today.   Labwork: LABS TODAY: CBC, BMET, PT/INR  Testing/Procedures: Your physician has requested that you have a cardiac catheterization on 09/17/17. Cardiac catheterization is used to diagnose and/or treat various heart conditions. Doctors may recommend this procedure for a number of different reasons. The most common reason is to evaluate chest pain. Chest pain can be a symptom of coronary artery disease (CAD), and cardiac catheterization can show whether plaque is narrowing or blocking your heart's arteries. This procedure is also used to evaluate the valves, as well as measure the blood flow and oxygen levels in different parts of your heart. For further information please visit [email or web address] Please follow instruction sheet, as given.  Follow-Up: Your physician wants you to follow-up in 2 weeks after heart catheterization  Any Other Special Instructions Will Be Listed Below (If Applicable).    [facility] CARDIOVASCULAR DIVISION [facility] ST OFFICE [address], [address] [place] [address] Dept: [phone] Loc: [phone]  Barbara Carroll  09/12/2017  You are scheduled for a Cardiac Catheterization on Tuesday, October 16 with Dr. Lance Muss.  1. Please arrive at the [facility] (Main Entrance A) at [facility]: [address] [place], [place] [address] at 5:30 AM (two hours before your procedure to ensure your preparation). Free valet parking service is available.   Special note: Every effort is made to have your procedure done on time. Please understand that emergencies sometimes delay scheduled procedures.  2. Diet: Do not eat or drink anything after midnight prior to your procedure except sips of water to take medications.  3. Labs: LABS  TODAY  4. Medication instructions in preparation for your procedure:  Do not take your Metformin 24 hours prior to your procedure and hold for 48 hours after your procedure  Do not take your irbesartan-hydrochlorothiazide the morning of your procedure  Take prednisone 50 mg (1 tablet): 4:30 PM the day before your procedure  Take Prednisone 50 mg (1 tablet): 10:30 PM the night before your procedure  Take Prednisone 50 mg (1 tablet): Take just prior to your arrival to the hospital  Take Benadryl 50 mg with your last dose of prednisone just prior to your arrival to the hospital  On the morning of your procedure, take your Aspirin and Plavix/Clopidogrel and any morning medicines NOT listed above.  You may use sips of water.  5. Plan for one night stay--bring personal belongings. 6. Bring a current list of your medications and current insurance cards. 7. You MUST have a responsible person to drive you home. 8. Someone MUST be with you the first 24 hours after you arrive home or your discharge will be delayed. 9. Please wear clothes that are easy to get on and off and wear slip-on shoes.  Thank you for allowing [place] to care for you!   -- [name]    If you need a refill on your cardiac medications before your next appointment, please call your pharmacy.

## 2017-09-13 LAB — BASIC METABOLIC PANEL
BUN / CREAT RATIO: 20 (ref 12–28)
BUN: 13 mg/dL (ref 8–27)
CO2: 26 mmol/L (ref 20–29)
Calcium: 10.2 mg/dL (ref 8.7–10.3)
Chloride: 99 mmol/L (ref 96–106)
Creatinine, Ser: 0.65 mg/dL (ref 0.57–1.00)
GFR calc Af Amer: 99 mL/min/{1.73_m2} (ref >59)
GFR calc non Af Amer: 86 mL/min/{1.73_m2} (ref >59)
GLUCOSE: 91 mg/dL (ref 65–99)
POTASSIUM: 4.2 mmol/L (ref 3.5–5.2)
SODIUM: 140 mmol/L (ref 134–144)

## 2017-09-13 LAB — CBC
Hematocrit: 37.1 % (ref 34.0–46.6)
Hemoglobin: 12.4 g/dL (ref 11.1–15.9)
MCH: 28.8 pg (ref 26.6–33.0)
MCHC: 33.4 g/dL (ref 31.5–35.7)
MCV: 86 fL (ref 79–97)
Platelets: 391 10*3/uL — ABNORMAL HIGH (ref 150–379)
RBC: 4.31 x10E6/uL (ref 3.77–5.28)
RDW: 14.5 % (ref 12.3–15.4)
WBC: 8.1 10*3/uL (ref 3.4–10.8)

## 2017-09-13 LAB — PROTIME-INR
INR: 1 (ref 0.8–1.2)
Prothrombin Time: 10.3 s (ref 9.1–12.0)

## 2017-09-16 ENCOUNTER — Telehealth: Payer: Self-pay

## 2017-09-16 NOTE — Telephone Encounter (Signed)
New Message ° ° pt verbalized that she is calling for rn  °

## 2017-09-16 NOTE — Telephone Encounter (Signed)
Returned Pt call.  Directions given as previously charted.  Pt indicates understanding.

## 2017-09-16 NOTE — Telephone Encounter (Addendum)
Patient contacted pre-catheterization at [facility] scheduled for:  09/17/2017 @ 0730 Verified arrival time and place:  [facility] @ 0530 Confirmed AM meds to be taken pre-cath with sip of water: Take ASA/Plavix Take prednisone taper 50 mg by mouth 10/15 @ 4:30 pm, 10:30 pm and 10/16 @ 4:30 am with 50 mg benadryl Do not take metformin, last dose 09/15/2017 pm Hold irbes/hctz am of Confirmed patient has responsible person to drive home post procedure and observe patient for 24 hours:  yes Addl concerns:  Call went to husband.  Husband requests nurse call back to speak to wife.

## 2017-09-17 ENCOUNTER — Ambulatory Visit (HOSPITAL_COMMUNITY)
Admission: RE | Admit: 2017-09-17 | Discharge: 2017-09-17 | Disposition: A | Discharge status: Home / Self Care | Payer: Medicare Other | Source: Ambulatory Visit | Attending: Interventional Cardiology | Admitting: Interventional Cardiology

## 2017-09-17 ENCOUNTER — Encounter (HOSPITAL_COMMUNITY)
Admission: RE | Disposition: A | Discharge status: Home / Self Care | Payer: Self-pay | Source: Ambulatory Visit | Attending: Interventional Cardiology

## 2017-09-17 DIAGNOSIS — M8580 Other specified disorders of bone density and structure, unspecified site: Secondary | ICD-10-CM | POA: Diagnosis not present

## 2017-09-17 DIAGNOSIS — I209 Angina pectoris, unspecified: Secondary | ICD-10-CM | POA: Diagnosis present

## 2017-09-17 DIAGNOSIS — Z7902 Long term (current) use of antithrombotics/antiplatelets: Secondary | ICD-10-CM | POA: Diagnosis not present

## 2017-09-17 DIAGNOSIS — Z7982 Long term (current) use of aspirin: Secondary | ICD-10-CM | POA: Insufficient documentation

## 2017-09-17 DIAGNOSIS — F419 Anxiety disorder, unspecified: Secondary | ICD-10-CM | POA: Insufficient documentation

## 2017-09-17 DIAGNOSIS — Z955 Presence of coronary angioplasty implant and graft: Secondary | ICD-10-CM | POA: Insufficient documentation

## 2017-09-17 DIAGNOSIS — M545 Low back pain: Secondary | ICD-10-CM | POA: Insufficient documentation

## 2017-09-17 DIAGNOSIS — Z8673 Personal history of transient ischemic attack (TIA), and cerebral infarction without residual deficits: Secondary | ICD-10-CM | POA: Insufficient documentation

## 2017-09-17 DIAGNOSIS — Z7951 Long term (current) use of inhaled steroids: Secondary | ICD-10-CM | POA: Insufficient documentation

## 2017-09-17 DIAGNOSIS — E119 Type 2 diabetes mellitus without complications: Secondary | ICD-10-CM | POA: Diagnosis not present

## 2017-09-17 DIAGNOSIS — K219 Gastro-esophageal reflux disease without esophagitis: Secondary | ICD-10-CM | POA: Insufficient documentation

## 2017-09-17 DIAGNOSIS — I509 Heart failure, unspecified: Secondary | ICD-10-CM | POA: Diagnosis not present

## 2017-09-17 DIAGNOSIS — M1990 Unspecified osteoarthritis, unspecified site: Secondary | ICD-10-CM | POA: Diagnosis not present

## 2017-09-17 DIAGNOSIS — Z91041 Radiographic dye allergy status: Secondary | ICD-10-CM | POA: Insufficient documentation

## 2017-09-17 DIAGNOSIS — Z87891 Personal history of nicotine dependence: Secondary | ICD-10-CM | POA: Insufficient documentation

## 2017-09-17 DIAGNOSIS — I25110 Atherosclerotic heart disease of native coronary artery with unstable angina pectoris: Secondary | ICD-10-CM | POA: Insufficient documentation

## 2017-09-17 DIAGNOSIS — E039 Hypothyroidism, unspecified: Secondary | ICD-10-CM | POA: Insufficient documentation

## 2017-09-17 DIAGNOSIS — I110 Hypertensive heart disease with heart failure: Secondary | ICD-10-CM | POA: Insufficient documentation

## 2017-09-17 DIAGNOSIS — E785 Hyperlipidemia, unspecified: Secondary | ICD-10-CM | POA: Insufficient documentation

## 2017-09-17 DIAGNOSIS — F329 Major depressive disorder, single episode, unspecified: Secondary | ICD-10-CM | POA: Insufficient documentation

## 2017-09-17 DIAGNOSIS — J42 Unspecified chronic bronchitis: Secondary | ICD-10-CM | POA: Diagnosis not present

## 2017-09-17 DIAGNOSIS — I2510 Atherosclerotic heart disease of native coronary artery without angina pectoris: Secondary | ICD-10-CM | POA: Diagnosis present

## 2017-09-17 DIAGNOSIS — G8929 Other chronic pain: Secondary | ICD-10-CM | POA: Diagnosis not present

## 2017-09-17 DIAGNOSIS — G4730 Sleep apnea, unspecified: Secondary | ICD-10-CM | POA: Diagnosis not present

## 2017-09-17 DIAGNOSIS — Z7984 Long term (current) use of oral hypoglycemic drugs: Secondary | ICD-10-CM | POA: Insufficient documentation

## 2017-09-17 HISTORY — PX: CORONARY STENT INTERVENTION: CATH118234

## 2017-09-17 HISTORY — PX: LEFT HEART CATH AND CORONARY ANGIOGRAPHY: CATH118249

## 2017-09-17 LAB — POCT ACTIVATED CLOTTING TIME
ACTIVATED CLOTTING TIME: 263 s
Activated Clotting Time: 340 seconds

## 2017-09-17 LAB — GLUCOSE, CAPILLARY: GLUCOSE-CAPILLARY: 167 mg/dL — AB (ref 65–99)

## 2017-09-17 SURGERY — LEFT HEART CATH AND CORONARY ANGIOGRAPHY
Anesthesia: LOCAL

## 2017-09-17 MED ORDER — IOPAMIDOL (ISOVUE-370) INJECTION 76%
INTRAVENOUS | Status: AC
  Filled 2017-09-17: qty 100

## 2017-09-17 MED ORDER — VERAPAMIL HCL 2.5 MG/ML IV SOLN
INTRAVENOUS | Status: AC
  Filled 2017-09-17: qty 2

## 2017-09-17 MED ORDER — LABETALOL HCL 5 MG/ML IV SOLN: 10.0000 mg | INTRAVENOUS | Status: AC | PRN

## 2017-09-17 MED ORDER — CLOPIDOGREL BISULFATE 75 MG PO TABS: 75.0000 mg | ORAL_TABLET | ORAL | Status: DC

## 2017-09-17 MED ORDER — HYDRALAZINE HCL 20 MG/ML IJ SOLN: 5.0000 mg | INTRAMUSCULAR | Status: AC | PRN

## 2017-09-17 MED ORDER — HEPARIN (PORCINE) IN NACL 2-0.9 UNIT/ML-% IJ SOLN
INTRAMUSCULAR | Status: AC | PRN
  Administered 2017-09-17: 1000 mL

## 2017-09-17 MED ORDER — SODIUM CHLORIDE 0.9 % WEIGHT BASED INFUSION: 1.0000 mL/kg/h | INTRAVENOUS | Status: DC

## 2017-09-17 MED ORDER — ASPIRIN 81 MG PO CHEW: 81.0000 mg | CHEWABLE_TABLET | ORAL | Status: DC

## 2017-09-17 MED ORDER — NITROGLYCERIN 1 MG/10 ML FOR IR/CATH LAB
INTRA_ARTERIAL | Status: AC
  Filled 2017-09-17: qty 10

## 2017-09-17 MED ORDER — FENTANYL CITRATE (PF) 100 MCG/2ML IJ SOLN
INTRAMUSCULAR | Status: AC
  Filled 2017-09-17: qty 2

## 2017-09-17 MED ORDER — ASPIRIN 81 MG PO CHEW: 81.0000 mg | CHEWABLE_TABLET | Freq: Every day | ORAL | Status: DC

## 2017-09-17 MED ORDER — MIDAZOLAM HCL 2 MG/2ML IJ SOLN
INTRAMUSCULAR | Status: AC
Start: 2017-09-17 — End: ?
  Filled 2017-09-17: qty 2

## 2017-09-17 MED ORDER — SODIUM CHLORIDE 0.9 % IV SOLN: 250.0000 mL | INTRAVENOUS | Status: DC | PRN

## 2017-09-17 MED ORDER — SODIUM CHLORIDE 0.9% FLUSH: 3.0000 mL | INTRAVENOUS | Status: DC | PRN

## 2017-09-17 MED ORDER — NITROGLYCERIN 1 MG/10 ML FOR IR/CATH LAB
INTRA_ARTERIAL | Status: DC | PRN
  Administered 2017-09-17: 200 ug

## 2017-09-17 MED ORDER — LIDOCAINE HCL 2 % IJ SOLN
INTRAMUSCULAR | Status: DC | PRN
  Administered 2017-09-17: 2 mL via INTRADERMAL

## 2017-09-17 MED ORDER — SODIUM CHLORIDE 0.9 % WEIGHT BASED INFUSION
3.0000 mL/kg/h | INTRAVENOUS | Status: AC
  Administered 2017-09-17: 3 mL/kg/h via INTRAVENOUS

## 2017-09-17 MED ORDER — FENTANYL CITRATE (PF) 100 MCG/2ML IJ SOLN
INTRAMUSCULAR | Status: DC | PRN
  Administered 2017-09-17 (×2): 25 ug via INTRAVENOUS
  Administered 2017-09-17: 50 ug via INTRAVENOUS

## 2017-09-17 MED ORDER — IOPAMIDOL (ISOVUE-370) INJECTION 76%
INTRAVENOUS | Status: AC
  Filled 2017-09-17: qty 50

## 2017-09-17 MED ORDER — IOPAMIDOL (ISOVUE-370) INJECTION 76%
INTRAVENOUS | Status: DC | PRN
Start: 2017-09-17 — End: 2017-09-17
  Administered 2017-09-17: 120 mL via INTRA_ARTERIAL

## 2017-09-17 MED ORDER — VERAPAMIL HCL 2.5 MG/ML IV SOLN
INTRAVENOUS | Status: DC | PRN
  Administered 2017-09-17: 10 mL via INTRA_ARTERIAL

## 2017-09-17 MED ORDER — HEPARIN (PORCINE) IN NACL 2-0.9 UNIT/ML-% IJ SOLN
INTRAMUSCULAR | Status: AC
  Filled 2017-09-17: qty 1000

## 2017-09-17 MED ORDER — MIDAZOLAM HCL 2 MG/2ML IJ SOLN
INTRAMUSCULAR | Status: DC | PRN
  Administered 2017-09-17 (×2): 1 mg via INTRAVENOUS
  Administered 2017-09-17: 2 mg via INTRAVENOUS

## 2017-09-17 MED ORDER — SODIUM CHLORIDE 0.9% FLUSH: 3.0000 mL | Freq: Two times a day (BID) | INTRAVENOUS | Status: DC

## 2017-09-17 MED ORDER — LIDOCAINE HCL 2 % IJ SOLN
INTRAMUSCULAR | Status: AC
  Filled 2017-09-17: qty 10

## 2017-09-17 MED ORDER — CLOPIDOGREL BISULFATE 75 MG PO TABS: 75.0000 mg | ORAL_TABLET | Freq: Every day | ORAL | Status: DC

## 2017-09-17 MED ORDER — ONDANSETRON HCL 4 MG/2ML IJ SOLN: 4.0000 mg | Freq: Four times a day (QID) | INTRAMUSCULAR | Status: DC | PRN

## 2017-09-17 MED ORDER — ANGIOPLASTY BOOK
Freq: Once | Status: DC
  Filled 2017-09-17 (×2): qty 1

## 2017-09-17 MED ORDER — HEPARIN SODIUM (PORCINE) 1000 UNIT/ML IJ SOLN
INTRAMUSCULAR | Status: AC
  Filled 2017-09-17: qty 1

## 2017-09-17 MED ORDER — HEPARIN SODIUM (PORCINE) 1000 UNIT/ML IJ SOLN
INTRAMUSCULAR | Status: DC | PRN
  Administered 2017-09-17: 4000 [IU] via INTRAVENOUS
  Administered 2017-09-17: 2000 [IU] via INTRAVENOUS
  Administered 2017-09-17: 5000 [IU] via INTRAVENOUS

## 2017-09-17 MED ORDER — SODIUM CHLORIDE 0.9 % IV SOLN: INTRAVENOUS | Status: AC

## 2017-09-17 MED ORDER — ACETAMINOPHEN 325 MG PO TABS: 650.0000 mg | ORAL_TABLET | ORAL | Status: DC | PRN

## 2017-09-17 SURGICAL SUPPLY — 19 items
BALLN SAPPHIRE 2.5X12 (BALLOONS) ×2
BALLN SAPPHIRE ~~LOC~~ 3.75X8 (BALLOONS) ×2 IMPLANT
BALLOON SAPPHIRE 2.5X12 (BALLOONS) ×1 IMPLANT
CATH INFINITI 5 FR JL3.5 (CATHETERS) ×2 IMPLANT
CATH INFINITI JR4 5F (CATHETERS) ×2 IMPLANT
CATH LAUNCHER 6FR EBU 3 (CATHETERS) ×2 IMPLANT
DEVICE RAD COMP TR BAND LRG (VASCULAR PRODUCTS) ×2 IMPLANT
GLIDESHEATH SLEND SS 6F .021 (SHEATH) ×2 IMPLANT
GUIDEWIRE INQWIRE 1.5J.035X260 (WIRE) ×1 IMPLANT
INQWIRE 1.5J .035X260CM (WIRE) ×2
KIT ENCORE 26 ADVANTAGE (KITS) ×2 IMPLANT
KIT HEART LEFT (KITS) ×2 IMPLANT
PACK CARDIAC CATHETERIZATION (CUSTOM PROCEDURE TRAY) ×2 IMPLANT
STENT SIERRA 3.00 X 12 MM (Permanent Stent) ×2 IMPLANT
TRANSDUCER W/STOPCOCK (MISCELLANEOUS) ×2 IMPLANT
TUBING CIL FLEX 10 FLL-RA (TUBING) ×2 IMPLANT
VALVE GUARDIAN II ~~LOC~~ HEMO (MISCELLANEOUS) ×2 IMPLANT
WIRE ASAHI PROWATER 180CM (WIRE) ×2 IMPLANT
WIRE HI TORQ BMW 190CM (WIRE) ×2 IMPLANT

## 2017-09-17 NOTE — Interval H&P Note (Signed)
Cath Lab Visit (complete for each Cath Lab visit)  Clinical Evaluation Leading to the Procedure:   ACS: No.  Non-ACS:    Anginal Classification: CCS III  Anti-ischemic medical therapy: Maximal Therapy (2 or more classes of medications)  Non-Invasive Test Results: No non-invasive testing performed  Prior CABG: No previous CABG      History and Physical Interval Note:  09/17/2017 8:19 AM  Barbara Carroll  has presented today for surgery, with the diagnosis of cad, angina  The various methods of treatment have been discussed with the patient and family. After consideration of risks, benefits and other options for treatment, the patient has consented to  Procedure(s): LEFT HEART CATH AND CORONARY ANGIOGRAPHY (N/A) as a surgical intervention .  The patient's history has been reviewed, patient examined, no change in status, stable for surgery.  I have reviewed the patient's chart and labs.  Questions were answered to the patient's satisfaction.     Lance Muss

## 2017-09-17 NOTE — Discharge Summary (Signed)
Discharge Summary/Same Day PCI    Patient ID: LEVITA MONICAL,  MRN: [id number], DOB/AGE: [birth year] 77 y.o.  Admit date: 09/17/2017 Discharge date: 09/17/2017  Primary Care Provider: Georgann Housekeeper Primary Cardiologist: Eldridge Dace  Discharge Diagnoses    Active Problems:   Angina pectoris [facility])   CAD (coronary artery disease)   Allergies Allergies  Allergen Reactions  . Pneumovax [Pneumococcal Polysaccharide Vaccine] Other (See Comments)    Severe local swelling   . Iohexol Hives and Other (See Comments)     Desc: HIVES SEVERAL HRS S/P IV CONTRAST.. [place] LAST SCAN W/ BENADRYL PREMED @ [facility] '01/AC.   Marland Kitchen Lisinopril Cough  . Other Rash and Other (See Comments)    Nickle  When teeth were wired in she place, patient obtained an infection and wiring had to be removed, Also allergic to silver  . Ivp Dye [Iodinated Diagnostic Agents] Itching    Diagnostic Studies/Procedures    Cath: 09/17/17  Conclusion     Prox Cx with widely patent stent.  Ost Cx lesion, 75 %stenosed.  A STENT SIERRA 3.00 X 12 MM drug eluting stent was successfully placed.  Post intervention, there is a 0% residual stenosis.  The left ventricular systolic function is normal.  LV end diastolic pressure is normal.  The left ventricular ejection fraction is 55-65% by visual estimate.  There is no aortic valve stenosis.   Continue dual antiplatelet therapy for at least one year.  Likely clopidogrel monotherapy after 1 year given location of stents and proximity to left main.  Possible same day PCI.  Post procedure pain was decreasing.  If she has more pain, will plan to watch overnight.   _____________   History of Present Illness     76 yo female with PMH of CAD s/p Lcx (9/17), HTN, HL, CVA, depression, hypothyroidism, GERD, and DM who presented to the office on 10/11 with recurrent angina with nitro use. Noted several of these episodes happened while at rest. Given hx, she was set up for  outpatient cath.    Hospital Course     Underwent cardiac cath with Dr. Eldridge Dace with patent pLcx stent, and 75% ost Lcx lesion that was treated with PCI/DES x1. Normal EF on LV gram. Plan to continue on DAPT with ASA/plavix for at least one year. Radial site was stable post cath. Seen by cardiac rehab while in short stay. Instructions/precautions given prior to discharge.   _____________  Discharge Vitals Blood pressure (!) 124/52, pulse 73, temperature 98 F (36.7 C), temperature source Oral, resp. rate 20, height  (1.575 m), weight 160 lb (72.6 kg), SpO2 98 %.  Filed Weights   09/17/17 0545  Weight: 160 lb (72.6 kg)    Labs & Radiologic Studies    CBC No results for input(s): WBC, NEUTROABS, HGB, HCT, MCV, PLT in the last 72 hours. Basic Metabolic Panel No results for input(s): NA, K, CL, CO2, GLUCOSE, BUN, CREATININE, CALCIUM, MG, PHOS in the last 72 hours. Liver Function Tests No results for input(s): AST, ALT, ALKPHOS, BILITOT, PROT, ALBUMIN in the last 72 hours. No results for input(s): LIPASE, AMYLASE in the last 72 hours. Cardiac Enzymes No results for input(s): CKTOTAL, CKMB, CKMBINDEX, TROPONINI in the last 72 hours. BNP Invalid input(s): POCBNP D-Dimer No results for input(s): DDIMER in the last 72 hours. Hemoglobin A1C No results for input(s): HGBA1C in the last 72 hours. Fasting Lipid Panel No results for input(s): CHOL, HDL, LDLCALC, TRIG, CHOLHDL, LDLDIRECT in the  last 72 hours. Thyroid Function Tests No results for input(s): TSH, T4TOTAL, T3FREE, THYROIDAB in the last 72 hours.  Invalid input(s): FREET3 _____________  No results found. Disposition   Pt is being discharged home today in good condition.  Follow-up Plans & Appointments    Follow-up Information    Allayne Butcher, PA-C Follow up on 10/01/2017.   Specialties:  Cardiology, Radiology Why:  at 8am for your follow up appt  Contact information: [address] [place] [address] [phone]          Discharge Instructions    Amb Referral to Cardiac Rehabilitation    Complete by:  As directed    Diagnosis:  Coronary Stents Comment - to [facility]   Call MD for:  redness, tenderness, or signs of infection (pain, swelling, redness, odor or green/yellow discharge around incision site)    Complete by:  As directed    Diet - low sodium heart healthy    Complete by:  As directed    Discharge instructions    Complete by:  As directed    Radial Site Care Refer to this sheet in the next few weeks. These instructions provide you with information on caring for yourself after your procedure. Your caregiver may also give you more specific instructions. Your treatment has been planned according to current medical practices, but problems sometimes occur. Call your caregiver if you have any problems or questions after your procedure. HOME CARE INSTRUCTIONS You may shower the day after the procedure.Remove the bandage (dressing) and gently wash the site with plain soap and water.Gently pat the site dry.  Do not apply powder or lotion to the site.  Do not submerge the affected site in water for 3 to 5 days.  Inspect the site at least twice daily.  Do not flex or bend the affected arm for 24 hours.  No lifting over 5 pounds (2.3 kg) for 5 days after your procedure.  Do not drive home if you are discharged the same day of the procedure. Have someone else drive you.  You may drive 24 hours after the procedure unless otherwise instructed by your caregiver.  What to expect: Any bruising will usually fade within 1 to 2 weeks.  Blood that collects in the tissue (hematoma) may be painful to the touch. It should usually decrease in size and tenderness within 1 to 2 weeks.  SEEK IMMEDIATE MEDICAL CARE IF: You have unusual pain at the radial site.  You have redness, warmth, swelling, or pain at the radial site.  You have drainage (other than a small amount of  blood on the dressing).  You have chills.  You have a fever or persistent symptoms for more than 72 hours.  You have a fever and your symptoms suddenly get worse.  Your arm becomes pale, cool, tingly, or numb.  You have heavy bleeding from the site. Hold pressure on the site.   PLEASE DO NOT MISS ANY DOSES OF YOUR PLAVIX!!!!! Also keep a log of you blood pressures and bring back to your follow up appt. Please call the office with any questions.   Patients taking blood thinners should generally stay away from medicines like ibuprofen, Advil, Motrin, naproxen, and Aleve due to risk of stomach bleeding. You may take Tylenol as directed or talk to your primary doctor about alternatives.   Increase activity slowly    Complete by:  As directed       Discharge Medications  Medication List    STOP taking these medications   HYDROcodone-acetaminophen 5-325 MG tablet Commonly known as:  NORCO   predniSONE 50 MG tablet Commonly known as:  DELTASONE     TAKE these medications   acetaminophen 500 MG tablet Commonly known as:  TYLENOL Take 500-1,000 mg by mouth every 6 (six) hours as needed for mild pain.   ALLERGY PO Take 1 tablet by mouth daily as needed (allergies).   ALPRAZolam 0.5 MG tablet Commonly known as:  XANAX Take 0.5 mg by mouth at bedtime.   amLODipine 10 MG tablet Commonly known as:  NORVASC Take 10 mg by mouth at bedtime.   aspirin 81 MG EC tablet Take 81 mg by mouth at bedtime. Swallow whole.   B-COMPLEX/B-12 PO Take 1 tablet by mouth daily.   cholecalciferol 1000 units tablet Commonly known as:  VITAMIN D Take 2,000 Units by mouth daily.   clopidogrel 75 MG tablet Commonly known as:  PLAVIX Take 75 mg by mouth at bedtime.   CO Q 10 PO Take 1 capsule by mouth daily.   fish oil-omega-3 fatty acids 1000 MG capsule Take 2 g by mouth daily.   fluticasone 50 MCG/ACT nasal spray Commonly known as:  FLONASE Place 1 spray into both nostrils as needed  for allergies.   irbesartan-hydrochlorothiazide 300-12.5 MG tablet Commonly known as:  AVALIDE Take 1 tablet by mouth daily.   levothyroxine 100 MCG tablet Commonly known as:  SYNTHROID, LEVOTHROID Take 100 mcg by mouth daily before breakfast.   lovastatin 20 MG tablet Commonly known as:  MEVACOR Take 20 mg by mouth daily with supper.   metFORMIN 500 MG tablet Commonly known as:  GLUCOPHAGE Take 250 mg by mouth 2 (two) times daily.   metoprolol tartrate 25 MG tablet Commonly known as:  LOPRESSOR Take 1 tablet (25 mg total) by mouth as directed. 1/2 TAB IN THE AM; 1 WHOLE TAB IN THE PM What changed:  how much to take  when to take this  additional instructions   multivitamin tablet Take 1 tablet by mouth daily.   MUSCLE RUB EX Apply 1 application topically daily as needed (knee pain).   nitroGLYCERIN 0.4 MG SL tablet Commonly known as:  NITROSTAT Place 1 tablet (0.4 mg total) under the tongue every 5 (five) minutes as needed for chest pain.   pantoprazole 40 MG tablet Commonly known as:  PROTONIX TAKE 1 TABLET BY MOUTH EVERY DAY   Potassium 99 MG Tabs Take 99 mg by mouth daily.   traMADol 50 MG tablet Commonly known as:  ULTRAM Take 25 mg by mouth every 6 (six) hours as needed for moderate pain.   TUMS PO Take 1 tablet by mouth daily.   TURMERIC PO Take 2 tablets by mouth daily.   valACYclovir 500 MG tablet Commonly known as:  VALTREX Take 1,000 mg by mouth daily as needed (cold sores).         Outstanding Labs/Studies   N/a  Duration of Discharge Encounter   Greater than 30 minutes including physician time.  Signed, Laverda Page NP-C 09/17/2017, 2:57 PM   I have examined the patient and reviewed assessment and plan and discussed with patient.  Agree with above as stated.  GEN: Well nourished, well developed, in no acute distress  HEENT: normal  Neck: no JVD, carotid bruits, or masses Cardiac: RRR; no murmurs, rubs, or gallops,no edema    Respiratory:  clear to auscultation bilaterally, normal work of breathing GI: soft, nontender,  nondistended,  MS: no deformity or atrophy  Skin: warm and dry, no rash Neuro:  Strength and sensation are intact Psych: euthymic mood, full affect  RIght wrist stable, without hematoma  Continue dual antiplatelet therapy.  Has been on Plavix longterm.  Lance Muss
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
## 2017-09-17 NOTE — Discharge Instructions (Signed)

## 2017-09-17 NOTE — Progress Notes (Signed)
CARDIAC REHAB PHASE I   Pt s/p PCI , for same day discharge. Completed PCI/stent education with pt and husband at bedside.  Reviewed risk factors, anti-platelet therapy, stent card, activity restrictions, ntg, exercise, heart healthy and diabetes diet handouts and phase 2 cardiac rehab. Pt verbalized understanding, receptive to education. Pt agrees to phase 2 cardiac rehab referral.  Pt states she will be staying near [place] for the winter, however, states cardiac rehab was too far away for her to attend there (we referred her to [place] last year). Will send referral to [facility] per pt request. Pt in recliner, call bell within reach.   [phone] Joylene Grapes, RN, BSN 09/17/2017 12:48 PM

## 2017-09-18 ENCOUNTER — Encounter (HOSPITAL_COMMUNITY): Payer: Self-pay | Admitting: Interventional Cardiology

## 2017-10-01 ENCOUNTER — Encounter: Payer: Self-pay | Admitting: Cardiology

## 2017-10-01 ENCOUNTER — Ambulatory Visit (INDEPENDENT_AMBULATORY_CARE_PROVIDER_SITE_OTHER): Payer: Medicare Other | Admitting: Cardiology

## 2017-10-01 VITALS — BP 118/60 | HR 67 | Resp 16 | Ht 62.0 in | Wt 157.8 lb

## 2017-10-01 DIAGNOSIS — I2510 Atherosclerotic heart disease of native coronary artery without angina pectoris: Secondary | ICD-10-CM

## 2017-10-01 DIAGNOSIS — Z9861 Coronary angioplasty status: Secondary | ICD-10-CM | POA: Diagnosis not present

## 2017-10-01 MED ORDER — ROSUVASTATIN CALCIUM 10 MG PO TABS: 10.0000 mg | ORAL_TABLET | Freq: Every day | ORAL | 3 refills | Status: DC
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
## 2017-10-01 NOTE — Patient Instructions (Signed)
Medication Instructions: Your physician has recommended you make the following change in your medication:  -1) STOP Lovastatin -2) START Rosuvastatin (Crestor) - Take 1 tablet (10 mg) by mouth daily  Labwork: Your physician recommends that you have lab work at your primary care doctors office: Fasting Lipid and Hepatic Function test - Please have Your primary care doctor fax your lab results to our office [phone] - RX Given  Procedures/Testing: None Ordered  Follow-Up: Your physician recommends that you schedule a follow-up appointment in: January/February 2019 with Dr. Eldridge DaceVaranasi   If you need a refill on your cardiac medications before your next appointment, please call your pharmacy.

## 2017-12-10 ENCOUNTER — Ambulatory Visit: Payer: Medicare Other | Admitting: Interventional Cardiology

## 2017-12-10 ENCOUNTER — Encounter: Payer: Self-pay | Admitting: Interventional Cardiology

## 2017-12-10 VITALS — BP 142/60 | HR 74 | Ht 62.0 in | Wt 154.2 lb

## 2017-12-10 DIAGNOSIS — E118 Type 2 diabetes mellitus with unspecified complications: Secondary | ICD-10-CM | POA: Diagnosis not present

## 2017-12-10 DIAGNOSIS — I25118 Atherosclerotic heart disease of native coronary artery with other forms of angina pectoris: Secondary | ICD-10-CM | POA: Diagnosis not present

## 2017-12-10 DIAGNOSIS — E782 Mixed hyperlipidemia: Secondary | ICD-10-CM | POA: Diagnosis not present

## 2017-12-10 DIAGNOSIS — R21 Rash and other nonspecific skin eruption: Secondary | ICD-10-CM | POA: Diagnosis not present

## 2017-12-10 MED ORDER — NITROGLYCERIN 0.4 MG SL SUBL: 0.4000 mg | SUBLINGUAL_TABLET | SUBLINGUAL | 3 refills | Status: AC | PRN

## 2017-12-10 NOTE — Patient Instructions (Signed)
Medication Instructions:  Your physician recommends that you continue on your current medications as directed. Please refer to the Current Medication list given to you today.   Labwork: None ordered  Testing/Procedures: None ordered  Follow-Up: Keep your appointment with Dr. Eldridge DaceVaranasi on June 4th at 10:20 AM.   Any Other Special Instructions Will Be Listed Below (If Applicable).     If you need a refill on your cardiac medications before your next appointment, please call your pharmacy.
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
## 2018-02-07 ENCOUNTER — Telehealth: Payer: Self-pay | Admitting: Interventional Cardiology

## 2018-02-07 NOTE — Telephone Encounter (Signed)
° °  [facility] Pre-operative Risk Assessment    Request for surgical clearance:  1. What type of surgery is being performed? Stem cell injection   When is this surgery scheduled? 02/17/18  What type of clearance is required (medical clearance vs. Pharmacy clearance to hold med vs. Both)? Pharmacy   2. Are there any medications that need to be held prior to surgery and how long?Plavix for 4 days   3. Practice name and name of physician performing surgery? [facility]   4. What is your office phone and fax number?ph#[phone], Fax# [phone]  5. Anesthesia type (None, local, MAC, general) ?no   Barbara Carroll 02/07/2018, 9:07 AM  _________________________________________________________________   (provider comments below)

## 2018-02-10 NOTE — Telephone Encounter (Signed)
   Pt had coronary angioplasty with drug eluting stent placement less than 12 months ago, in October 2018. Based on guidelines, she will need to continue DAPT with ASA and Plavix, w/o interruption for a minimum of 12 months. Thus, she is unable to stop ASA and Plavix at this time, as doing so would jeopardize the stent and can cause a heart attack. Please find out if this is an elective procedure that can be delayed until after October. If so, then patient will need to wait until she has completed a year of Plavix. If this is a procedure that must be done then we will need to consult Dr. Eldridge DaceVaranasi.   CV Callback, please contact [facility] to find out the reason for procedure and see if it can be delayed.

## 2018-02-10 NOTE — Telephone Encounter (Signed)
   Primary Cardiologist: Dr. Eldridge DaceVaranasi  Chart reviewed as part of pre-operative protocol coverage. I also spoke w/ PA at [facility], regarding medical necessity of procedure. This is an elective procedure, thus pt cannot be cleared for surgery at this time given recent DES < 12 months ago. She will need to continue ASA and Plavix, w/o interruption, thus cannot have procedure. She will need to delay this procedure until at least 10/2018 and will need clearance from Dr. Eldridge DaceVaranasi at that time.   Requesting provider has been notified that pt is not cleared. They will notify pt. I will remove from preop pool.   Robbie LisBrittainy Evin Chirco, PA-C 02/10/2018, 2:40 PM

## 2018-02-13 ENCOUNTER — Other Ambulatory Visit: Payer: Self-pay | Admitting: Interventional Cardiology

## 2018-02-24 ENCOUNTER — Other Ambulatory Visit: Payer: Self-pay | Admitting: Interventional Cardiology

## 2018-05-01 ENCOUNTER — Encounter: Payer: Self-pay | Admitting: Cardiology

## 2018-05-01 ENCOUNTER — Ambulatory Visit: Payer: Medicare Other | Admitting: Cardiology

## 2018-05-01 VITALS — BP 104/64 | HR 55 | Ht 62.0 in | Wt 154.8 lb

## 2018-05-01 DIAGNOSIS — I2510 Atherosclerotic heart disease of native coronary artery without angina pectoris: Secondary | ICD-10-CM
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
## 2018-05-01 NOTE — Patient Instructions (Signed)
Medication Instructions:  1. Your physician recommends that you continue on your current medications as directed. Please refer to the Current Medication list given to you today.   Labwork: NONE ORDERED TODAY   Testing/Procedures: NONE ORDERED TODAY  Follow-Up: DR. VARANASI IN 10/2018; WE WILL SEND OUT A REMINDER LETTER A FEW MONTHS EARLIER   Any Other Special Instructions Will Be Listed Below (If Applicable).     If you need a refill on your cardiac medications before your next appointment, please call your pharmacy.

## 2018-05-06 ENCOUNTER — Ambulatory Visit: Payer: Medicare Other | Admitting: Interventional Cardiology

## 2018-10-10 ENCOUNTER — Other Ambulatory Visit (INDEPENDENT_AMBULATORY_CARE_PROVIDER_SITE_OTHER): Payer: Self-pay | Admitting: Physician Assistant

## 2018-10-10 ENCOUNTER — Ambulatory Visit
Admission: RE | Admit: 2018-10-10 | Discharge: 2018-10-10 | Disposition: A | Discharge status: Home / Self Care | Payer: Medicare Other | Source: Ambulatory Visit | Attending: Physician Assistant | Admitting: Physician Assistant

## 2018-10-10 DIAGNOSIS — M25561 Pain in right knee: Secondary | ICD-10-CM

## 2018-10-10 DIAGNOSIS — M25562 Pain in left knee: Secondary | ICD-10-CM

## 2018-10-10 DIAGNOSIS — G8929 Other chronic pain: Secondary | ICD-10-CM

## 2018-10-10 DIAGNOSIS — M542 Cervicalgia: Secondary | ICD-10-CM

## 2018-10-10 DIAGNOSIS — M545 Low back pain, unspecified: Secondary | ICD-10-CM
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
## 2018-10-21 ENCOUNTER — Encounter: Payer: Self-pay | Admitting: Interventional Cardiology

## 2018-10-21 ENCOUNTER — Ambulatory Visit: Payer: Medicare Other | Admitting: Interventional Cardiology

## 2018-10-21 VITALS — BP 120/58 | HR 63 | Ht 62.0 in | Wt 160.8 lb

## 2018-10-21 DIAGNOSIS — Z9861 Coronary angioplasty status: Secondary | ICD-10-CM

## 2018-10-21 DIAGNOSIS — E118 Type 2 diabetes mellitus with unspecified complications: Secondary | ICD-10-CM

## 2018-10-21 DIAGNOSIS — E782 Mixed hyperlipidemia: Secondary | ICD-10-CM | POA: Diagnosis not present

## 2018-10-21 DIAGNOSIS — I119 Hypertensive heart disease without heart failure: Secondary | ICD-10-CM

## 2018-10-21 DIAGNOSIS — I2510 Atherosclerotic heart disease of native coronary artery without angina pectoris: Secondary | ICD-10-CM | POA: Diagnosis not present

## 2018-10-21 MED ORDER — ROSUVASTATIN CALCIUM 10 MG PO TABS: 10.0000 mg | ORAL_TABLET | Freq: Every day | ORAL | 3 refills | Status: DC

## 2018-10-21 NOTE — Patient Instructions (Addendum)
Medication Instructions:  Your physician has recommended you make the following change in your medication:   1. STOP: pravastatin (pravachol)  2. START: rosuvastatin (crestor) 10 mg tablet: Take 1 tablet by mouth once a day  If you need a refill on your cardiac medications before your next appointment, please call your pharmacy.   Lab work: None Ordered  If you have labs (blood work) drawn today and your tests are completely normal, you will receive your results only by: Marland Kitchen. MyChart Message (if you have MyChart) OR . A paper copy in the mail If you have any lab test that is abnormal or we need to change your treatment, we will call you to review the results.  Testing/Procedures: None ordered  Follow-Up: AS NEEDED  Any Other Special Instructions Will Be Listed Below (If Applicable).

## 2018-11-21 ENCOUNTER — Other Ambulatory Visit: Payer: Self-pay | Admitting: Interventional Cardiology

## 2019-05-15 IMAGING — CR DG KNEE 1-2V*L*
2 series · 2 of 2 positions shown · non-contrast
Comparison: MRI LEFT knee 05/16/2017.

CLINICAL DATA: Chronic ANTERIOR LEFT knee pain. Personal history of
MEDIAL meniscal repair in 1848.

EXAM:
LEFT KNEE - 1-2 VIEW

[w knee ap left]
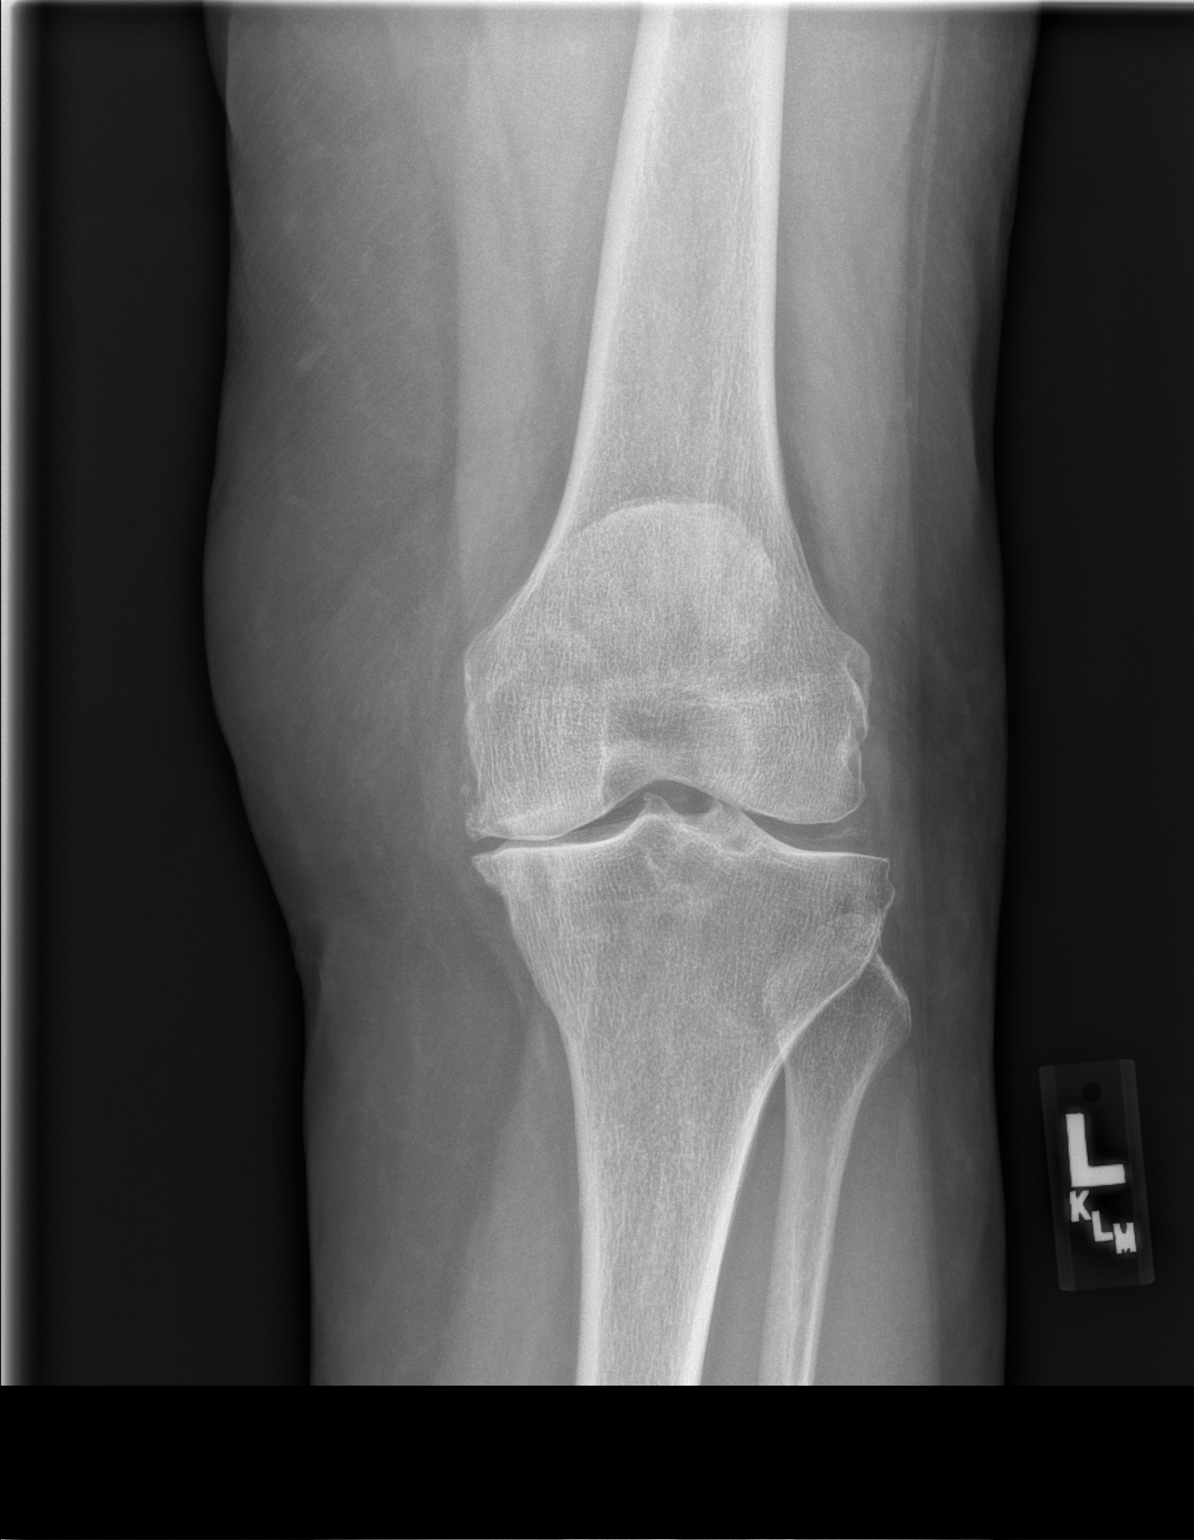

[w knee lat left]
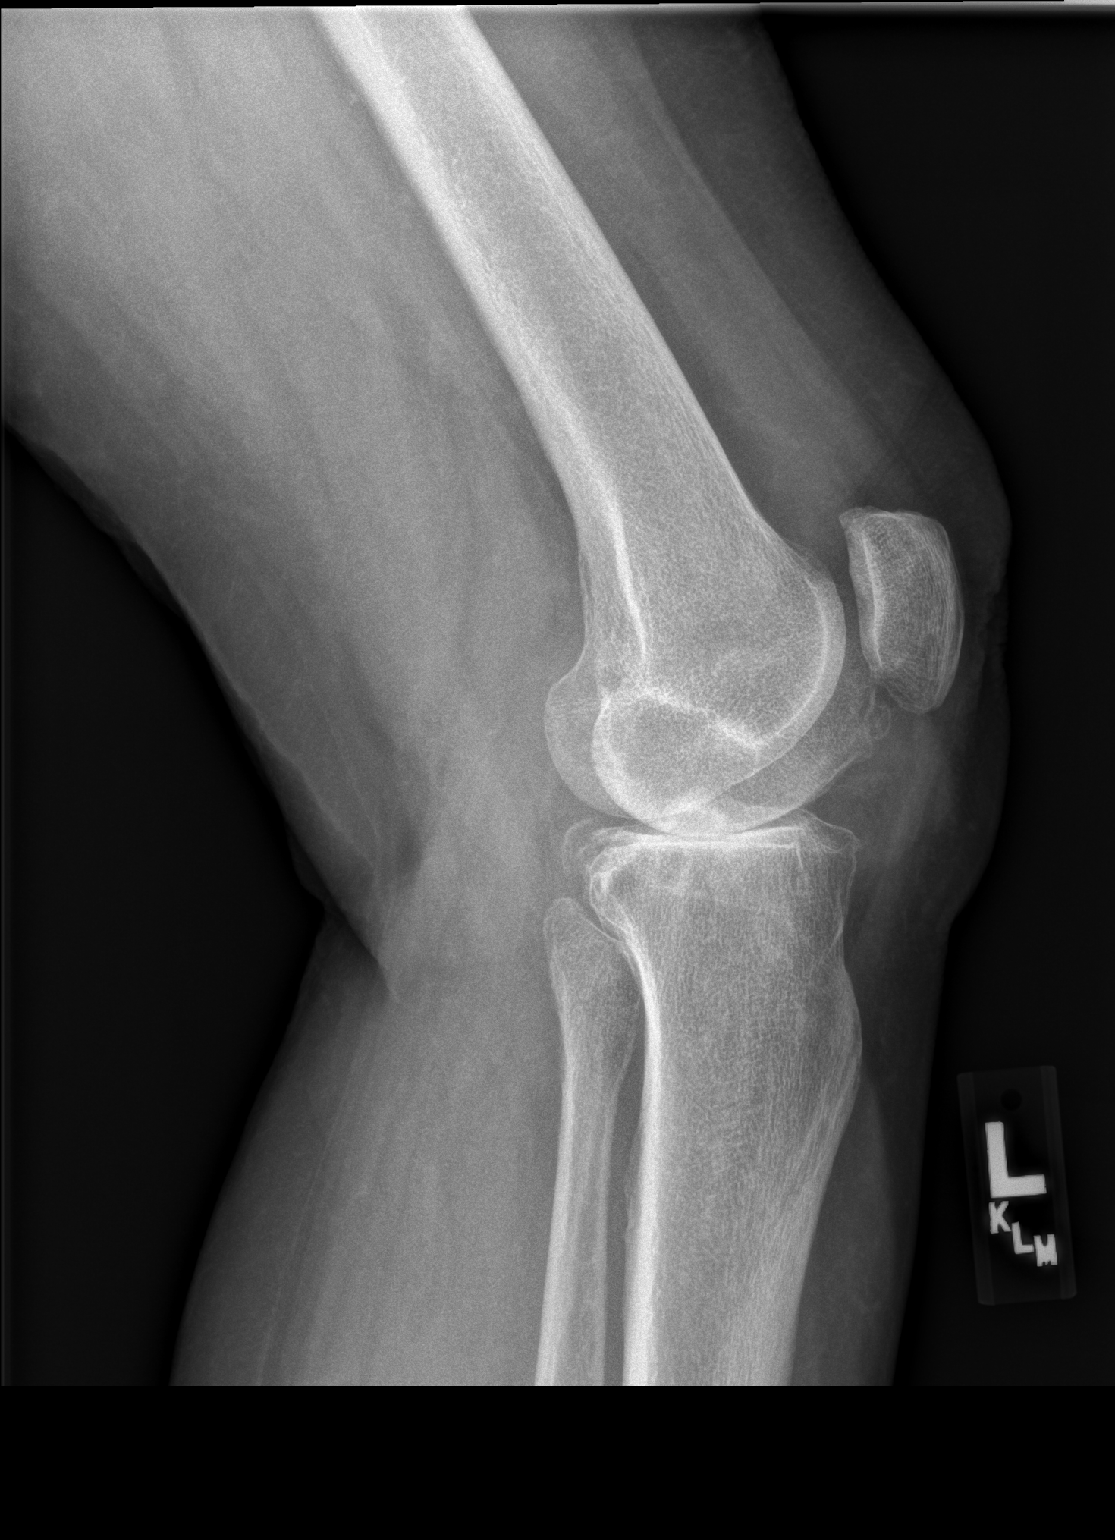

[2 of 2 positions shown; findings below may reference images not displayed]

FINDINGS: No evidence of acute or subacute fractures. Well-preserved bone
mineral density. Severe narrowing of the MEDIAL compartment joint
space with associated spurring. Mild narrowing of the patellofemoral
and LATERAL compartment joint spaces. Chondrocalcinosis involving
the LATERAL meniscus. Small joint effusion suspected.
IMPRESSION: 1. No acute or subacute osseous abnormality.
2. Severe osteoarthritis involving the MEDIAL compartment and mild
osteoarthritis involving the LATERAL and patellofemoral
compartments.
3. CPPD with chondrocalcinosis involving the LATERAL meniscus.
4. Small joint effusion suspected.

## 2019-05-15 IMAGING — CR DG LUMBAR SPINE 2-3V
3 series · 3 of 3 positions shown · non-contrast
Comparison: Bone window images from CT abdomen and pelvis
08/20/2013. Chest x-ray 01/25/2015 is correlated in order to count
ribs.

CLINICAL DATA: Chronic RIGHT-sided low back pain radiating into the
LEFT hip. No known injuries.

EXAM:
LUMBAR SPINE - 2-3 VIEW

[w lumbar spine lat]
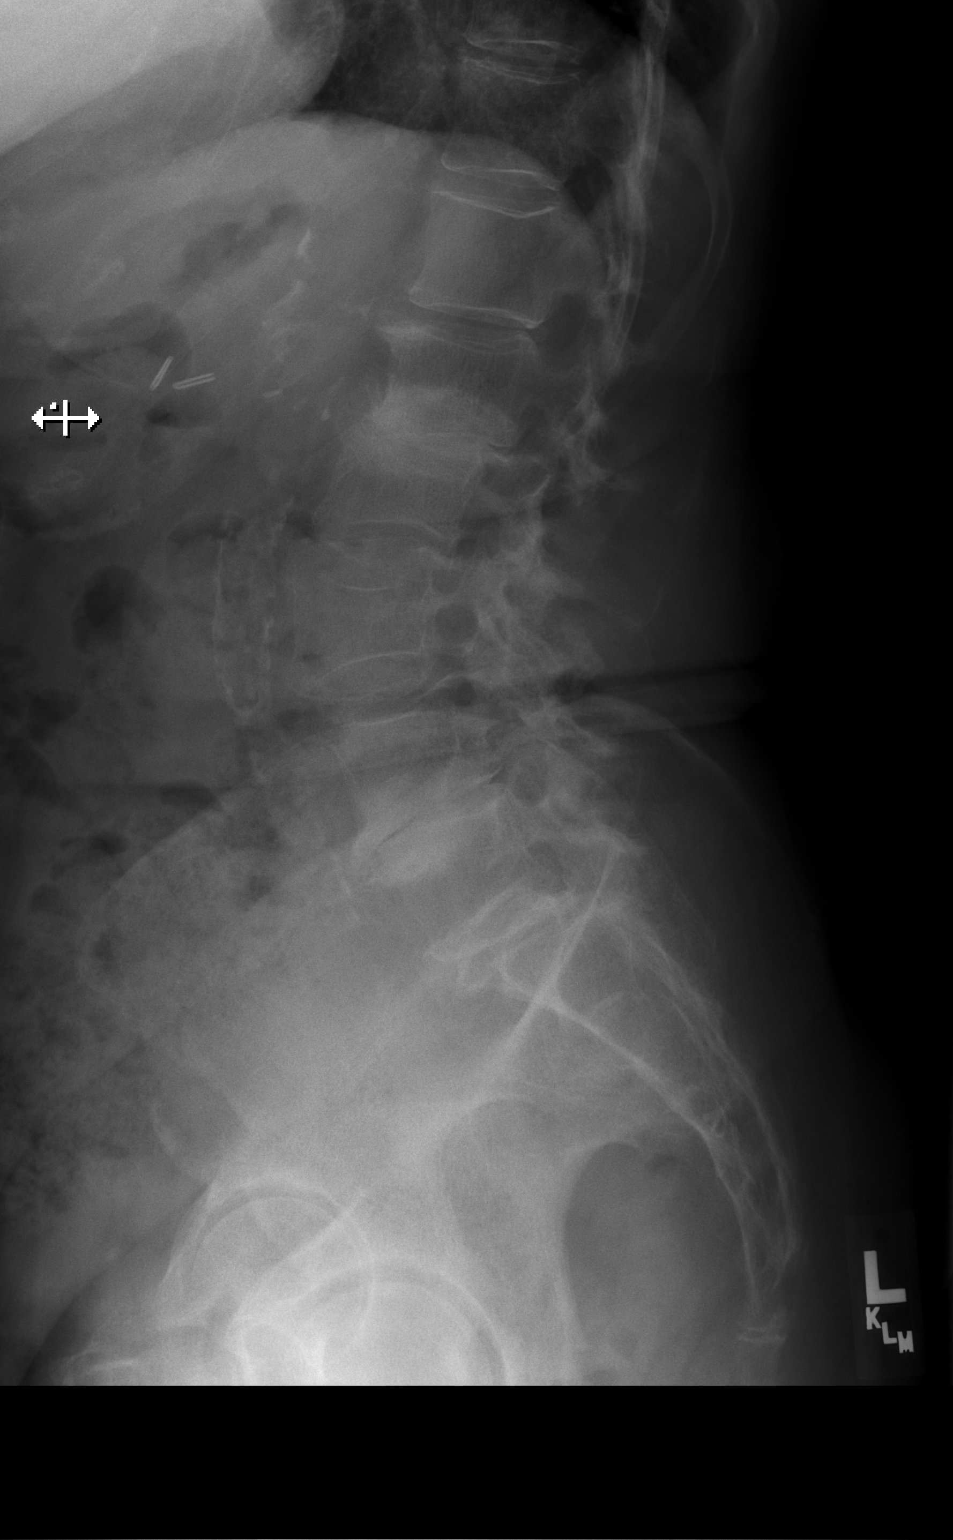

[w lumbar l-5 s-1 spot]
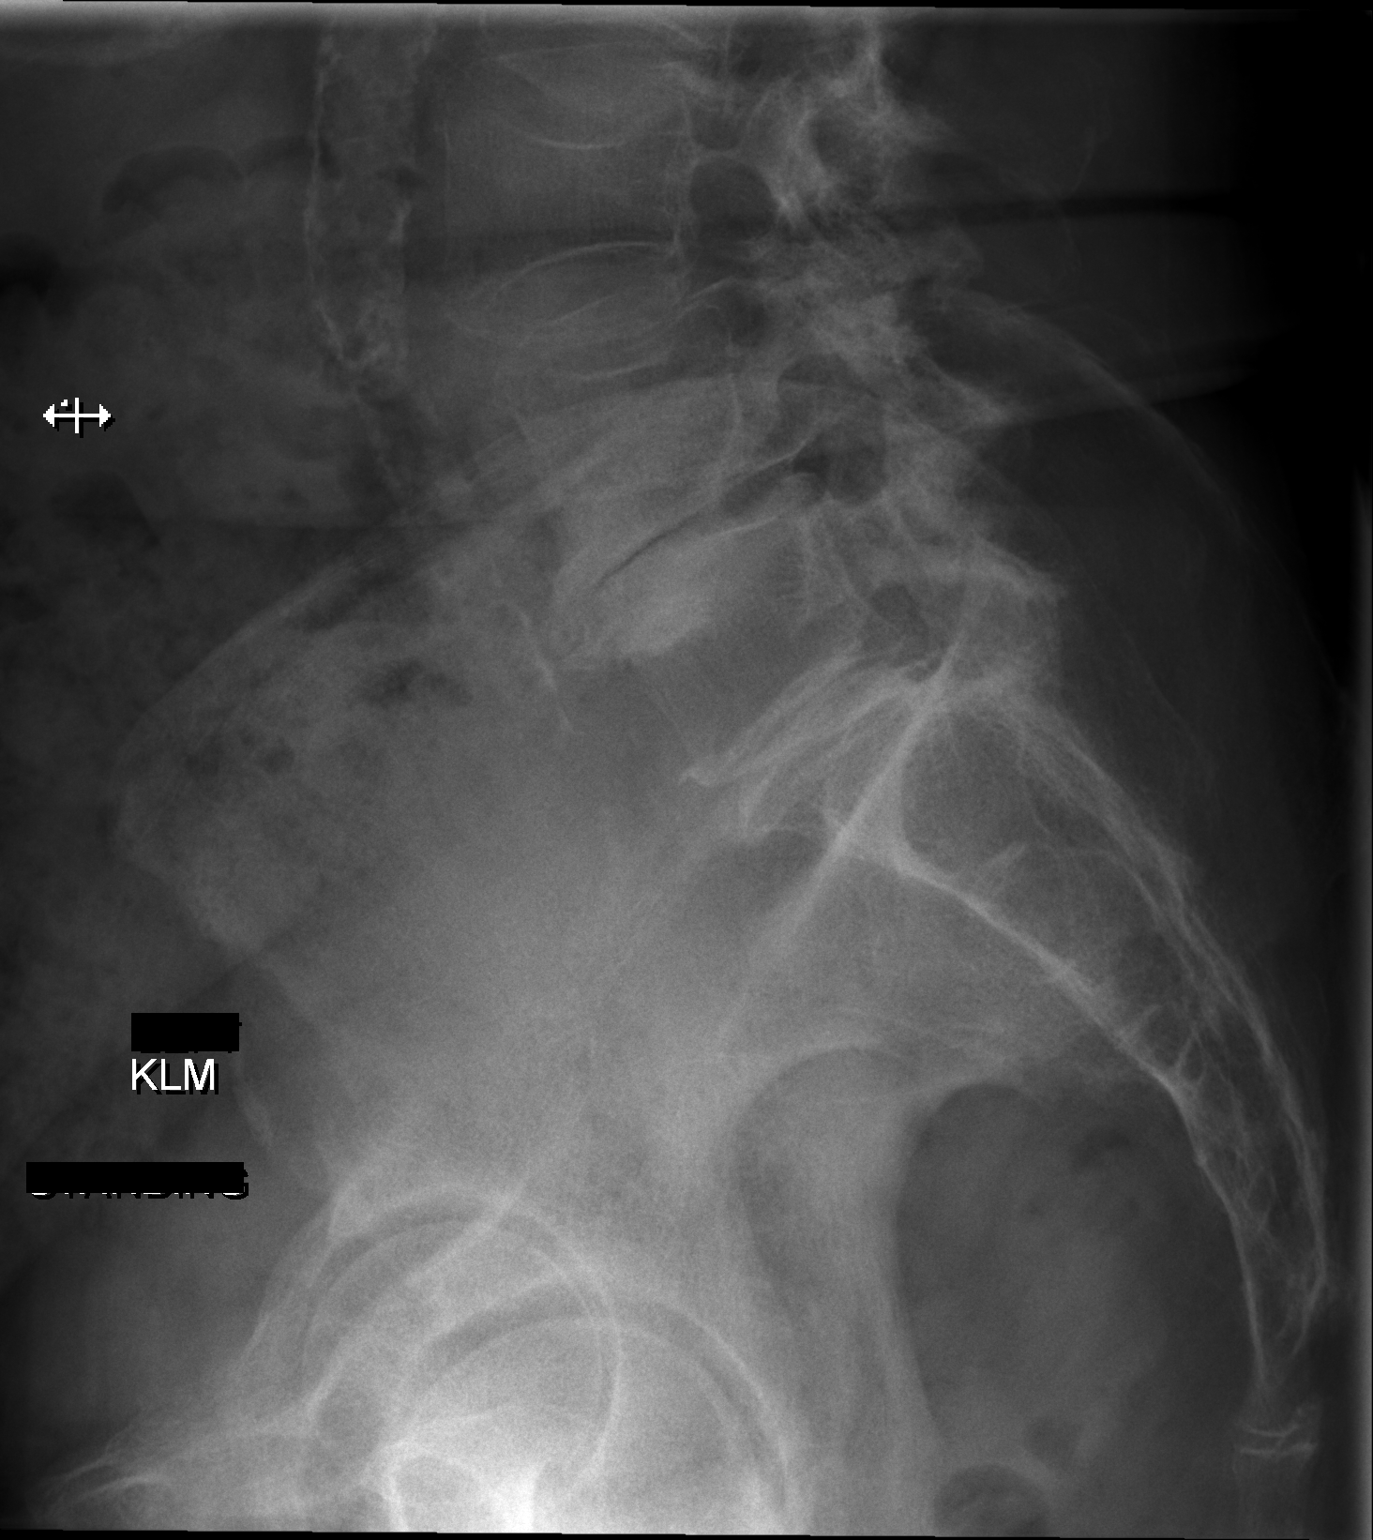

[w lumbar spine ap]
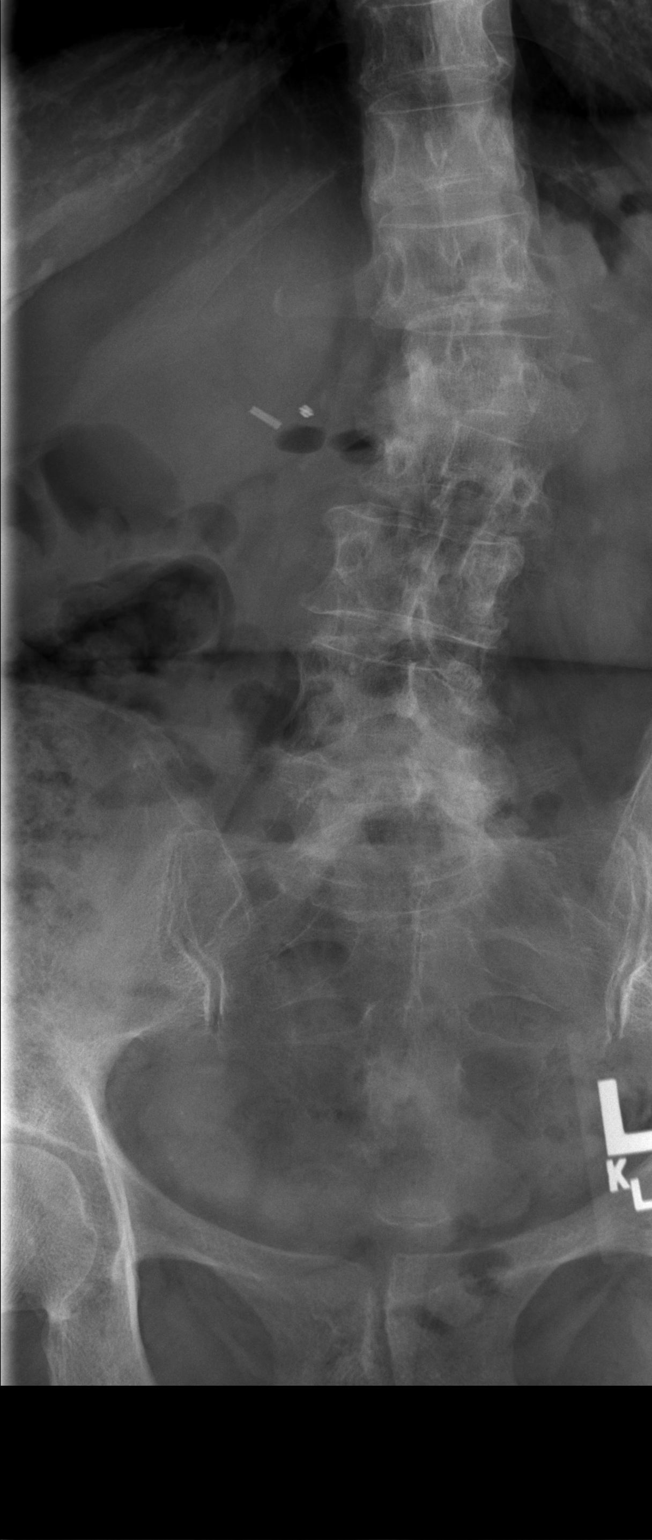

[3 of 3 positions shown; findings below may reference images not displayed]

FINDINGS: Five non-rib-bearing lumbar vertebrae with T12 having small,
rudimentary ribs, confirmed on the prior chest x-ray. Thoracolumbar
levoscoliosis. Degenerative grade 1 retrolisthesis of L2 relative to
L3 measuring 7 mm and degenerative spondylolisthesis of L3 on L4
measuring approximately 6 mm. Complete loss of the disc spaces at
L1-2 and L4-5 with associated hypertrophic changes in the adjacent
endplates. Mild disc space narrowing and endplate hypertrophic
changes at T12-L1 and L5-S1. Diffuse facet degenerative changes.
These findings have all progressed since 5758.

Aortoiliac atherosclerosis without evidence of aneurysm.
IMPRESSION: 1. Please see above comments regarding numbering of the lumbar
vertebrae as T12 has small, rudimentary ribs, confirmed on a prior
chest x-ray.
2. Severe degenerative disc disease and spondylosis at L1-2 and
L4-5. Mild degenerative disc disease and spondylosis at T12-L1 and
L5-S1.
3. Degenerative grade 1 retrolisthesis of L2 relative to L3 and
degenerative grade 1 spondylolisthesis of L3 on L4.
4. Diffuse facet degenerative changes.
5. Above findings have progressed since [DATE].  Aortic Atherosclerosis ([id number])

## 2019-07-31 ENCOUNTER — Ambulatory Visit (INDEPENDENT_AMBULATORY_CARE_PROVIDER_SITE_OTHER): Payer: Self-pay | Admitting: INTERNAL MEDICINE CARDIOVASCULAR DISEASE

## 2019-08-28 ENCOUNTER — Other Ambulatory Visit: Payer: Self-pay

## 2019-08-28 ENCOUNTER — Encounter (INDEPENDENT_AMBULATORY_CARE_PROVIDER_SITE_OTHER): Payer: Self-pay | Admitting: INTERNAL MEDICINE CARDIOVASCULAR DISEASE

## 2019-08-28 ENCOUNTER — Ambulatory Visit (INDEPENDENT_AMBULATORY_CARE_PROVIDER_SITE_OTHER): Payer: Medicare PPO | Admitting: INTERNAL MEDICINE CARDIOVASCULAR DISEASE

## 2019-08-28 VITALS — BP 186/57 | HR 74 | Resp 18 | Ht 62.5 in | Wt 167.3 lb

## 2019-08-28 DIAGNOSIS — R0602 Shortness of breath: Secondary | ICD-10-CM

## 2019-08-28 DIAGNOSIS — I10 Essential (primary) hypertension: Secondary | ICD-10-CM

## 2019-08-28 DIAGNOSIS — I209 Angina pectoris, unspecified: Secondary | ICD-10-CM

## 2019-08-28 DIAGNOSIS — I25118 Atherosclerotic heart disease of native coronary artery with other forms of angina pectoris: Secondary | ICD-10-CM

## 2019-08-28 MED ORDER — EMPAGLIFLOZIN 10 MG TABLET
10.0000 mg | ORAL_TABLET | Freq: Every day | ORAL | 3 refills | Status: AC
Start: 2019-08-28 — End: 2019-09-27

## 2019-08-28 MED ORDER — NITROGLYCERIN 0.4 MG SUBLINGUAL TABLET
0.4000 mg | SUBLINGUAL_TABLET | SUBLINGUAL | 3 refills | Status: DC | PRN
Start: 2019-08-28 — End: 2020-11-04

## 2019-08-28 MED ORDER — METOPROLOL TARTRATE 50 MG TABLET
50.00 mg | ORAL_TABLET | Freq: Two times a day (BID) | ORAL | 3 refills | Status: DC
Start: 2019-08-28 — End: 2019-12-21

## 2019-08-28 MED ORDER — CLONIDINE HCL 0.1 MG TABLET
ORAL_TABLET | ORAL | 3 refills | Status: DC
Start: 2019-08-28 — End: 2019-12-21

## 2019-08-28 MED ORDER — ISOSORBIDE MONONITRATE ER 30 MG TABLET,EXTENDED RELEASE 24 HR
30.00 mg | ORAL_TABLET | Freq: Every morning | ORAL | 3 refills | Status: AC
Start: 2019-08-28 — End: 2019-09-27

## 2019-08-28 NOTE — H&P (Addendum)
[facility] [place]  [address]  [place] [id number]  [facility]  History and Physical     Name: Jerene CannyCarol Nasca MRN:  [id number]   Date: 08/28/2019 Age: 78 y.o.       Chief Complaint: New Patient; Chest Pain  (states  chest pressure. ); Palpitations; Shortness of Breath; and Fatigue    History of Present Illness   78 year old with established CAD status post stent to the LCx x 2      Chest Pain     This is a recurrent problem. The current episode started more than 1 month ago. The onset quality is gradual. The problem occurs intermittently. The problem has been gradually worsening. The pain is present in the substernal region. The pain is at a severity of 4/10. The pain is moderate. The quality of the pain is described as pressure and dull. Associated symptoms include malaise/fatigue, palpitations and shortness of breath. Pertinent negatives include no dizziness, near-syncope or syncope. The pain is aggravated by nothing. She has tried nitroglycerin for the symptoms. The treatment provided moderate relief.   Her past medical history is significant for CAD, diabetes, hyperlipidemia, hypertension and MI.   Palpitations    This is a recurrent problem. The problem occurs intermittently. Associated symptoms include chest pain, malaise/fatigue and shortness of breath. Pertinent negatives include no dizziness, near-syncope or syncope.   Shortness of Breath   This is a chronic problem. The problem occurs daily. The problem has been gradually worsening. Associated symptoms include chest pain. Pertinent negatives include no leg swelling or syncope. The symptoms are aggravated by any activity. Her past medical history is significant for CAD.   Fatigue   Associated symptoms include chest pain and shortness of breath.       There are no active problems to display for this patient.    Past Medical History:   Diagnosis Date   . Coronary artery disease    . Essential hypertension          Past  Surgical History:   Procedure Laterality Date   . HX HEART CATHETERIZATION  2017 and 2018    Stented twice  prox circ  and circ   . HX THYROIDECTOMY  1978   . HX WISDOM TEETH EXTRACTION     . KNEE SURGERY  2018    muscle removed   . LAPAROSCOPIC CHOLECYSTECTOMY     . RECONSTRUCTION OF NOSE  1980         Current Outpatient Medications   Medication Sig   . ALPRAZolam (XANAX) 0.5 mg Oral Tablet Take 0.5 mg by mouth Every evening   . amLODIPine (NORVASC) 10 mg Oral Tablet Take 10 mg by mouth Once a day   . azithromycin (ZITHROMAX) 250 mg Oral Tablet Take 250 mg by mouth Once a day   . Cholecalciferol, Vitamin D3, (VITAMIN D-3) 10 mcg (400 unit) Oral Tablet, Chewable Take by mouth   . clopidogreL (PLAVIX) 75 mg Oral Tablet Take 75 mg by mouth Once a day   . coenzyme Q10 30 mg Oral Capsule Take 30 mg by mouth Every evening with dinner   . empagliflozin (JARDIANCE) 10 mg Oral Tablet Take 1 Tab (10 mg total) by mouth Once a day for 30 days   . fluticasone propionate (FLONASE NASL) by Nasal route   . Irbesartan-Hydrochlorothiazide 300-12.5 mg Oral Tablet Take 300 mg by mouth Once a day   . levothyroxine (SYNTHROID) 100 mcg Oral Tablet  Take 100 mcg by mouth Every morning   . metFORMIN (GLUCOPHAGE) 500 mg Oral Tablet Take 500 mg by mouth Once a day   . metoprolol tartrate (LOPRESSOR) 25 mg Oral Tablet Take 25 mg by mouth Once a day   . nitroGLYCERIN (NITROSTAT) 0.4 mg Sublingual Tablet, Sublingual 1 Tab (0.4 mg total) by Sublingual route Every 5 minutes as needed for Chest pain for up to 30 days for 3 doses over 15 minutes   . pantoprazole (PROTONIX) 40 mg Oral Tablet, Delayed Release (E.C.) Take 40 mg by mouth Once a day   . POTASSIUM-99 ORAL Take by mouth   . rosuvastatin (CRESTOR) 10 mg Oral Tablet Take 10 mg by mouth Once a day   . UNKNOWN MEDICATION (UNKNOWN MEDICATION) Tumeric   . vitamin B complex (B COMPLEX-VITAMIN B12 ORAL) Take by mouth     Allergies   Allergen Reactions   . Iv Contrast Hives/ Urticaria   . Nickel  Hives/ Urticaria     Family Medical History:     Problem Relation (Age of Onset)    Cardiomyopathy Father    Heart Attack Mother    Heart Disease Sister            Social History     Tobacco Use   . Smoking status: Former Smoker     Packs/day: 0.50     Years: 10.00     Pack years: 5.00   . Smokeless tobacco: Never Used   Substance Use Topics   . Alcohol use: Not Currently     Alcohol/week: 1.0 standard drinks     Types: 1 Cans of beer per week     Frequency: Monthly or less     Drinks per session: 1 or 2     Comment: drank long time ago not current.      Review of Systems  Review of Systems   Constitutional: Positive for malaise/fatigue. Negative for fatigue.   Respiratory: Positive for shortness of breath. Negative for chest tightness.    Cardiovascular: Positive for chest pain and palpitations. Negative for leg swelling, syncope and near-syncope.   Gastrointestinal: Negative for blood in stool.   Genitourinary: Negative for hematuria.   Neurological: Negative for dizziness and syncope.   Hematological: Does not bruise/bleed easily.   All other systems reviewed and are negative.    Examination:  BP (!) 186/57 (Site: Left, Patient Position: Sitting)   Pulse 74   Resp 18   Ht 1.588 m (5' 2.5")   Wt 75.9 kg (167 lb 4.8 oz)   SpO2 97%   BMI 30.11 kg/m       Physical Exam  Vitals signs and nursing note reviewed.   Constitutional:       Appearance: She is well-developed.   HENT:      Head: Normocephalic.   Eyes:      Pupils: Pupils are equal, round, and reactive to light.   Neck:      Musculoskeletal: Normal range of motion.      Vascular: No JVD.   Cardiovascular:      Rate and Rhythm: Normal rate and regular rhythm.      Heart sounds: Normal heart sounds. No murmur.   Pulmonary:      Effort: Pulmonary effort is normal.      Breath sounds: Normal breath sounds.   Abdominal:      General: Bowel sounds are normal.      Palpations: Abdomen is soft.   Musculoskeletal: Normal  range of motion.         General: No  deformity.   Skin:     General: Skin is warm and dry.   Neurological:      Mental Status: She is alert and oriented to person, place, and time.      Cranial Nerves: No cranial nerve deficit.               Assessment and Plan  Problem List Items Addressed This Visit     None      Visit Diagnoses     Coronary artery disease of native heart with stable angina pectoris, unspecified vessel or lesion type (CMS HCC)    -  Primary    Essential hypertension        SOB (shortness of breath)             Symptoms suggest Angina  Patient symptoms suggestive of angina based on description symptoms, discussed prior cardiac workup, invasive and non invasive workup. Discussed various anti-anginal agents and side effect profile. Patient agreed to proceed with adjustment of medication and agreed to proceed with cardiac cath if stress test showed ischemia. Risks and benefits discussed and a printout given.  Given that she has shortness of breath an echocardiogram was ordered to assess EF and valvular problems will update lipid panel to treat goal LDL less than 70.    Diabetes  Patient's hemoglobin A1c is unavailable to me, and patient is not on Jardiance, in accordance with ACC aha guidelines patient should  be started on 10 mg of Jardiance daily side effects were discussed with him a printout was given to him he will follow up with PCP for further management of his diabetes thereafter.     Orders Placed This Encounter   . TRANSTHORACIC ECHOCARDIOGRAM ([facility] ONLY)   . LEXISCAN MPS SPECT/REST& STRESS/CARDIOLITE([facility])   . nitroGLYCERIN (NITROSTAT) 0.4 mg Sublingual Tablet, Sublingual   . empagliflozin (JARDIANCE) 10 mg Oral Tablet   . metoprolol tartrate (LOPRESSOR) 50 mg Oral Tablet   . isosorbide mononitrate (IMDUR) 30 mg Oral Tablet Sustained Release 24 hr   . cloNIDine HCL (CATAPRES) 0.1 mg Oral Tablet     This note may have been partially generated using M[employer] Fluency Direct system, and there may be some  incorrect words, spellings, and punctuation that were not noted in checking the note before saving.    Thornell Sartorius, MD

## 2019-08-28 NOTE — Patient Instructions (Signed)
Understanding Coronary Artery Disease (CAD)    To understand coronary artery disease (CAD), you need to know how your heart works. Your heart is a muscle that pumps blood throughout your body. To work right, your heart needs a steady supply of oxygen. It gets this oxygen from blood supplied by the coronary arteries.      Healthy artery   Healthy artery. When a coronary artery is healthy and has no blockages, blood flows through easily. Healthy arteries can easily supply the oxygen-rich blood your heart needs.      Damaged artery   Damaged artery. Coronary artery disease starts when damage to the artery lining leads to the buildup of fat-like substances and cholesterol along the artery wall. This is called plaque. This damage could be caused by conditions like high blood pressure, high blood sugar, or smoking. This plaque buildup starts to narrow the arteries carrying blood to the heart. This is called atherosclerosis,      Narrowed artery   Narrowed artery. As more plaque builds up, your artery has trouble supplying blood to your heart muscle when it needs it most, such as during exercise. You may not feel any symptoms when this happens. Or you may feel angina--pressure, tightness, achiness, or pain in your chest, jaw, neck, back, or arm.      Blocked artery   Blocked artery. A piece of plaque may break off and completely block the artery. Or more commonly, a blood clot may form on a ruptured plaque andplug the narrowed artery. When this happens, blood flow is blocked from reaching the heart. Without oxygen-rich blood, part of the heart muscle becomes damaged and stops working. You may feel crushing pressure or pain in or around your chest. This is a heart attack (acute myocardial infarction, or AMI) and is a medical emergency.   StayWell last reviewed this educational content on 03/03/2018   2000-2020 [employer]. [address], [place] [address]. All rights reserved. This information is  not intended as a substitute for professional medical care. Always follow your healthcare professional's instructions.        Discharge Instructions: Taking Fast-Acting Nitroglycerin   Your healthcare provider prescribed nitroglycerin for you. This medicine relieves chest pain caused by a lack of blood flow to the heart (angina) by getting more oxygen-rich blood to your heart. It relaxes the blood vessels in the heart as well as the rest of the body. Fast-acting nitroglycerin can stop an angina attack. Follow the steps below for taking fast-acting nitroglycerin. Note: Your healthcare provider may give you slightly different instructions. If so, follow them carefully.   The name of my fast-acting nitroglycerin medicine is ________________________________.  To stop an angina attack  Sit down before you take your nitroglycerin. The medicine may make you feel dizzy because it lowers blood pressure rapidly.   Using fast-acting tablets   Place1 tablet under your tongue. You can also place it between your lip and gum, or between your cheek and gum.   Let the tablet dissolve completely. Don't swallow or chew the tablet. It can be helpful to have a sip of water beforehand to help it dissolve.   As the tablet is dissolving, don't eat or drink anything, or smoke or chew tobacco.  Using fast-acting spray   Open your mouth and hold the sprayer just in front of your mouth.   Press the button on the top. Spray once on or under your tongue. Don't inhale.   Close  your mouth. Then wait a few seconds before you swallow.  After taking 1 tablet or spraying once   Continue sitting for5 minutes.   If the angina goes away completely, rest for a while and continue your normal routine.    Call 911  Call 911 if your angina lasts longer than5 minutes and1 tablet or 1 spray has not relieved it. Don't delay. You may be having a heart attack (acute myocardial infarction, or AMI)!   After you call 911, take a second tablet. Or spray a  second time. Wait another 5 minutes. If the angina still does not go away, take a third tablet, or spray a third time. Don't take more than 3 tablets, or spray more than 3 times, within 15 minutes. Stay on the phone with 911 for more instructions.   Precautions   Limit the amount of alcohol you drink. Too much alcohol can cause dizziness or fainting.   Don'ttake phosphodiesterase inhibitors, such as sildenafil (Viagra) or tadalafil (Cialis) at any time if you are on nitroglycerin treatment. These are medicines used to treat sexual dysfunction in men. The combination of nitroglycerin with these medicines can cause a severe drop in blood pressure. This can lead to dizziness, fainting, heart attack, or stroke.   Check the expiration date. Nitroglycerin can lose its effectiveness over time.   Tell your healthcare provider if your angina attacks last longer, occur more often, or are more severe as this can be a sign of worsening coronary artery disease.    Possible side effects of nitroglycerin  If you have any of these side effects, tell your healthcare provider right away. But don't stop taking the medicine until your doctor tells you to. Mild side effects include:    More gas (flatulence) than normal   Bloating   Nausea   Hair loss   Decreased appetite   Weight loss   Headache   Dizziness   Flushing (redness of the face, neck, or chest)  When to call your healthcare provider  Call your healthcare provider right away if you have any of the following:   Severe headache   Severe dizziness, or fainting   Nausea or vomiting   Fast heartbeat (higher than 100 beats per minute)   Swollen ankles   Weakness   Angina attacks that last longer, occur more often, or are more severe than in the past or occur at rest  StayWell last reviewed this educational content on 07/03/2018   2000-2020 [employer]. [address] [place] [address]. All rights reserved. This information is not intended  as a substitute for professional medical care. Always follow your healthcare professional's instructions.        Recognizing a Heart Attack or Angina    If you have risk factors for heart problems, you should always watch for signs of angina or a heart attack. If you have a sudden heart problem, getting treatment right away could save your life.  Risk factors for a heart attack include:   Advanced age   High cholesterol   High blood pressure   Having an immediate family member such as a brother, sister, or parent who had a heart attack before age 73   Diabetes   Smoking   Being overweight   Smoking or using stimulants such as cocaine or amphetamine   High stress  There are other risk factors, including eating a high-fat diet and not getting much exercise.  Understanding angina and heart  attack   Anginais a painful burning, tightness, or pressure in the chest, back, neck, throat, or jaw. It means not enough blood is getting to the heart muscle. This is usually from a blocked artery in the heart. Angina is a signthat you may be having, or are about to have, aheart attack. You need to call 911 right away.   A heart attack is also known as acute myocardial infarction (AMI). It's what happens when blood and oxygen can't get to part of the heartmuscle. That part of the heart muscle is damaged and starts to die. If enough of the heart is affected, it will not be able beat correctly.. This will severely limit its ability to send blood to the brain and the rest of thebody. It may cause death. It's vital to get help as soon as possible for a heart attack.    Stable angina versus unstable angina  Stable angina isalso known as chronic angina. It has a typical pattern. It happens when you exert yourself physically or feel a strong emotion. Nitroglycerin, rest, or both, will easily ease stable angina symptoms. Stable angina symptoms will most likely feel the sameeach time you have them. It's important to  discuss these symptoms with your healthcare provider. They can be a warning sign of a future heart attack.  Unstable angina causes unexpected or unpredictable symptoms, commonly when you are at rest. Unstable angina is a medical emergency. Angina is also considered unstable if resting and nitroglycerin don't ease symptoms. It's also unstable if symptoms are getting worse, happening more often, orlasting longer. These symptoms may mean you havea severe blockage or a spasm of a heart artery. Unstable angina is commonly a sign of an active heart attack. Remember the following tips:   Stable angina symptoms should go away with rest or medicine. If they don't go away, call 911!   Stable angina symptoms last for only a few minutes. If they last longer than that, or if they go away and come back, you may be having a heart attack. Call 911!   If you have shortness of breath, cold sweat, nausea, or lightheadedness, call 911!  Charna Busman that shows up for the first time, there is only one response: ?Call 911! You should never diagnose angina by yourself. If these symptoms are new, or worse than usual, call 911!  Warning signs of a heart attack  If you have symptoms that you can't explain, call 911right away. Don't drive yourself to the emergency room. These are warning signs of a possible heart attack:   Chest discomfort.Most heart attacks involve discomfort in the center of the chest that lasts more than a few minutes, or that goes away and comes back. It can feel like uncomfortable pressure, squeezing, fullness, or pain.   Discomfort in other areas of the upper body.Symptoms can include pain or discomfort in one or both arms, the back, neck, jaw, or stomach.   Shortness of breathwith or without chest discomfort   Other signsmay include breaking out in a cold sweat, nausea, or lightheadedness.  If you think someone is having a heart attack, call 911 instead of driving the person to the ER. After you call 911,  you will be told what to do. The 911 dispatcher may tell you to give the person aspirin while waiting for help to arrive. If the dispatcher doesn't tell you to do this, don't give the person aspirin. Aspirin can be dangerous in certain cases.   Note for women.  Like men, women commonly have chest pain or discomfort as a heart attack symptom. But women are somewhat more likely than men to have less common symptoms. These include shortness of breath, heartburn, nausea and vomiting, back pain, or jaw pain.   Note for older adults. Older people may also not have typical symptoms of a heart attack. They may have symptoms thatinclude fainting, weakness, or confusion. Ignoring these symptoms can lead to critical illness or death. You should get your symptoms checked out right away.   If you've had a heart attack. People who have had one heart attack are at risk of having another. Your provider may prescribe medicine such as nitroglycerin to take when chest pain starts. Or you may need medicines to lower your heart rate and blood pressure to prevent angina and another heart attack. Remember to take any medicines your provider has given. Don't not stop them without speaking with your provider first.  If you have diabetes: Silent heart problems  Over time, high blood sugar can damage nerves in your body. This may keep you from feeling pain caused by a heart problem, leading to a "silent" heart problem. If you don't feel symptoms, you are less able to know you may be having a heart attack and get treatment right away. Talk with your healthcare provider about how to lower your risk for silent heart problems.  StayWell last reviewed this educational content on 10/03/2018   2000-2020 [employer] [place]. [address] [place], [place] [address]. All rights reserved. This information is not intended as a substitute for professional medical care. Always follow your healthcare professional's  instructions.        Controlling High Blood Pressure  High blood pressure (hypertension) is often called the silent killer. This is because many people who have it, don't know it. It can be very dangerous. High blood pressure can raise your risk of heart attack, stroke, heart disease, and heart failure. Controlling your blood pressure can decrease your risk of these problems. It's important to know the appropriate blood pressure range and remember to check your blood pressure regularly. Doing so can save your life.  Blood pressure measurements are given as 2 numbers. Systolic blood pressure is the upper number. This is the pressure when the heart contracts. Diastolic blood pressure is the lower number. This is the pressure when the heart relaxes between beats.  Blood pressure is categorized as normal, elevated, or stage 1 or stage 2 high blood pressure:   Normal blood pressure is systolic of less than 120 and diastolic of less than 80 (120/80)   Elevated blood pressure is systolic of 120 to 129 and diastolic less than 80   Stage 1 high blood pressure is systolic is 130 to 139 or diastolic between 80 to 89   Stage 2 high blood pressure is when systolic is 140 or higher or the diastolic is 90 or higher  A heart-healthy lifestyle can help you control your blood pressure without medicines. Here are some things you can do to pursue a heart-healthy lifestyle:    Choose heart-healthy foods   Select low-salt, low-fat foods. Limit sodium intake to 2,400 mg per day or the amount suggested by your healthcare provider.   Limit canned, dried, cured, packaged, and fast foods. These can contain a lot of salt.   Eat 8 to 10 servings of fruits and vegetables every day.   Choose lean meats, fish, or chicken.   Eat whole-grain pasta, brown rice,  and beans.   Eat 2 to 3 servings of low-fat or fat-free dairy products.   Ask your doctor about the DASH eating plan. This plan helps reduce blood pressure.   When you go to a  restaurant, ask that your meal be prepared with no added salt.    Stay at a healthy weight   Ask your healthcare provider how many calories to eat a day. Then stick to that number.   Ask your healthcare provider what weight range is healthiest for you. If you are overweight, a weight loss of only 3% to 5% of your body weightcan help lower blood pressure. Generally, a good weight loss goal is to lose 10% of your body weight in a year.   Limit snacks and sweets.   Get regular exercise.    Get up and get active   Find activities you enjoy that can be done alone or with friends or family. Such activities might include bicycling, dancing, walking, or jogging.   Park farther away from building entrances to walk more.   Use stairs instead of the elevator.   When you can, walk or bike instead of driving.   Rake leaves, garden, or do household repairs.   Be active at a moderate to vigorous level of physical activity for at least 40 minutes for a minimum of 3 to 4 days a week.    Manage stress   Make time to relax and enjoy life. Find time to laugh.   Communicate your concerns with your loved ones and your healthcare provider.   Visit with family and friends, and keep up with hobbies.    Limit alcohol and quit smoking   Men should have no more than 2 drinks per day.   Women should have no more than 1 drink per day.   Talk with your healthcare provider about quitting smoking. Smoking significantly increases your risk for heart disease and stroke. Ask your healthcare provider about community smoking cessation programs and other options.    Medicines  If lifestyle changes aren't enough, your healthcare provider may prescribe high blood pressure medicine. Take all medicines as prescribed. If you have any questions about your medicines, ask your healthcare provider before stopping or changing them.  StayWell last reviewed this educational content on 05/03/2018   2000-2020 [employer] [address], [place], [place] [id number]. All rights reserved. This information is not intended as a substitute for professional medical care. Always follow your healthcare professional's instructions.        Your High Blood Pressure Risk Factors  Risk factors are things that make you more likely to have a disease or condition. Do you know your risk factors for high blood pressure? You can't do anything about some risk factors. But other risk factors are things that can be changed. Know what high blood pressure risk factors you have. Then find out what changes you can make to help control your risk for high blood pressure.Start with the change that you think will be easiest for you.  Risk factors you can't control  Though you can't change any of the things listed below, check off the ones that apply to you. The more boxes you check, the greater your risk for high blood pressure.  Family history  ? One or both of your parents or grandparents has had high blood pressure or heart disease.  Gender, age, and race  ? You're a man over age 79 or a postmenopausal woman.  Or you are African American.  Risk factors you can control  There are plenty of risk factors for high blood pressure that you can control. Learn what these risk factors are and then find out how to reduce your risk. Check the ones that apply to you.  ? What you eat  Do you eat a lot of salty, fatty, fried, or greasy foods? Do you go to restaurants or eat out frequently?  ? What you drink  Do you have more than 1 alcoholic drink a day if you are a female or more than 2 drinks a day if you female?  ? If you smoke or someone close to you smokes  Do you smoke cigarettes or cigars, chew tobacco, or dip snuff? Are you exposed to second-hand smoke on a regular basis?  ? How active you are  Are you inactive most of the time at work and at home? Do you go weeks without exercising?  ? Your weight  Has your doctor said that you are 15 or more pounds overweight?  ? Your stress  level  Do you often feel anxious, nervous, and stressed? Do you feel that you don't have a supportive environment?  StayWell last reviewed this educational content on 06/02/2018   2000-2020 [employer]. [address], [place] [address]. All rights reserved. This information is not intended as a substitute for professional medical care. Always follow your healthcare professional's instructions.

## 2019-08-28 NOTE — Nursing Note (Signed)
Meds checked with patients list and confirmed correct pharmacy with patient for e-scribing.   Youlanda Roys, LPN  05/19/8371, 90:21

## 2019-08-31 ENCOUNTER — Other Ambulatory Visit (INDEPENDENT_AMBULATORY_CARE_PROVIDER_SITE_OTHER): Payer: Self-pay | Admitting: INTERNAL MEDICINE CARDIOVASCULAR DISEASE

## 2019-09-17 ENCOUNTER — Ambulatory Visit (INDEPENDENT_AMBULATORY_CARE_PROVIDER_SITE_OTHER): Payer: Self-pay | Admitting: INTERNAL MEDICINE CARDIOVASCULAR DISEASE

## 2019-09-17 NOTE — Telephone Encounter (Signed)
Patient called with appointment reminder of MPS.   Patient is aware to be NPO after midnight and no caffeine after 4pm.  Patient verbalized understanding

## 2019-09-18 DIAGNOSIS — I493 Ventricular premature depolarization: Secondary | ICD-10-CM

## 2019-09-21 ENCOUNTER — Other Ambulatory Visit (INDEPENDENT_AMBULATORY_CARE_PROVIDER_SITE_OTHER): Payer: Self-pay

## 2019-09-21 DIAGNOSIS — I209 Angina pectoris, unspecified: Secondary | ICD-10-CM

## 2019-09-21 DIAGNOSIS — R0602 Shortness of breath: Secondary | ICD-10-CM

## 2019-09-27 ENCOUNTER — Other Ambulatory Visit: Payer: Self-pay | Admitting: Interventional Cardiology

## 2019-09-28 DIAGNOSIS — I209 Angina pectoris, unspecified: Secondary | ICD-10-CM

## 2019-09-29 ENCOUNTER — Other Ambulatory Visit (INDEPENDENT_AMBULATORY_CARE_PROVIDER_SITE_OTHER): Payer: Self-pay

## 2019-09-29 DIAGNOSIS — I209 Angina pectoris, unspecified: Secondary | ICD-10-CM

## 2019-09-29 DIAGNOSIS — R0602 Shortness of breath: Secondary | ICD-10-CM

## 2019-10-09 ENCOUNTER — Ambulatory Visit (INDEPENDENT_AMBULATORY_CARE_PROVIDER_SITE_OTHER): Payer: Self-pay | Admitting: INTERNAL MEDICINE CARDIOVASCULAR DISEASE

## 2019-10-09 NOTE — Telephone Encounter (Addendum)
Patient notified    ----- Message from Samuella Bruin, PA-C sent at 10/09/2019 12:52 PM EST -----  Adonis Brook, RN              She can remain off of therapy then. Thanks,   Samuella Bruin, PA-C 10/09/2019, 12:53      Patient states that she in unable to tolerate Jardiance.  Makes her feel as if her heart is racing      ----- Message from Georgina Snell sent at 10/09/2019 12:41 PM EST -----  Pt left message and asked if you would call her. She has a question about the new meds Dr.Ali prescribed her. THanks.

## 2019-11-20 ENCOUNTER — Other Ambulatory Visit: Payer: Self-pay | Admitting: Interventional Cardiology

## 2019-11-21 ENCOUNTER — Other Ambulatory Visit: Payer: Self-pay

## 2019-11-21 MED ORDER — METOPROLOL TARTRATE 25 MG PO TABS: ORAL_TABLET | ORAL | 0 refills | Status: DC

## 2019-11-24 ENCOUNTER — Other Ambulatory Visit: Payer: Self-pay | Admitting: Interventional Cardiology

## 2019-11-30 ENCOUNTER — Encounter (INDEPENDENT_AMBULATORY_CARE_PROVIDER_SITE_OTHER): Payer: Self-pay | Admitting: INTERNAL MEDICINE CARDIOVASCULAR DISEASE

## 2019-12-20 ENCOUNTER — Other Ambulatory Visit (INDEPENDENT_AMBULATORY_CARE_PROVIDER_SITE_OTHER): Payer: Self-pay | Admitting: INTERNAL MEDICINE CARDIOVASCULAR DISEASE

## 2019-12-23 ENCOUNTER — Other Ambulatory Visit (INDEPENDENT_AMBULATORY_CARE_PROVIDER_SITE_OTHER): Payer: Self-pay | Admitting: INTERNAL MEDICINE CARDIOVASCULAR DISEASE

## 2020-01-19 ENCOUNTER — Other Ambulatory Visit: Payer: Self-pay

## 2020-01-19 ENCOUNTER — Ambulatory Visit (INDEPENDENT_AMBULATORY_CARE_PROVIDER_SITE_OTHER): Payer: Medicare PPO | Admitting: Medical

## 2020-01-19 ENCOUNTER — Encounter (INDEPENDENT_AMBULATORY_CARE_PROVIDER_SITE_OTHER): Payer: Self-pay | Admitting: Medical

## 2020-01-19 VITALS — BP 144/74 | HR 76 | Resp 18 | Ht 62.5 in | Wt 170.6 lb

## 2020-01-19 DIAGNOSIS — I25119 Atherosclerotic heart disease of native coronary artery with unspecified angina pectoris: Secondary | ICD-10-CM

## 2020-01-19 DIAGNOSIS — E785 Hyperlipidemia, unspecified: Secondary | ICD-10-CM

## 2020-01-19 DIAGNOSIS — I209 Angina pectoris, unspecified: Secondary | ICD-10-CM

## 2020-01-19 DIAGNOSIS — I10 Essential (primary) hypertension: Secondary | ICD-10-CM

## 2020-01-19 DIAGNOSIS — I2510 Atherosclerotic heart disease of native coronary artery without angina pectoris: Secondary | ICD-10-CM

## 2020-01-19 DIAGNOSIS — I272 Pulmonary hypertension, unspecified: Secondary | ICD-10-CM

## 2020-01-19 DIAGNOSIS — Z95820 Peripheral vascular angioplasty status with implants and grafts: Secondary | ICD-10-CM

## 2020-01-19 MED ORDER — DIPHENHYDRAMINE 50 MG CAPSULE
ORAL_CAPSULE | ORAL | 0 refills | Status: AC
Start: 2020-01-19 — End: ?

## 2020-01-19 MED ORDER — ISOSORBIDE MONONITRATE ER 60 MG TABLET,EXTENDED RELEASE 24 HR
60.00 mg | ORAL_TABLET | Freq: Every morning | ORAL | 1 refills | Status: DC
Start: 2020-01-19 — End: 2020-04-18

## 2020-01-19 MED ORDER — PREDNISONE 50 MG TABLET
ORAL_TABLET | ORAL | 0 refills | Status: DC
Start: 2020-01-19 — End: 2021-08-04

## 2020-01-19 NOTE — Patient Instructions (Addendum)
Increase Imdur (Isosorbide) to 60 mg once daily. You can take 2 of the 30 mg tablets once daily until you run out. Take one 60 mg tablet once you pick up the new prescription.   Please call our office with any change in your symptoms or status.

## 2020-01-19 NOTE — Nursing Note (Addendum)
Contacted patient and provided the below instructions.  Patient ordered cardiac cath. Dr. Karie Mainland  to be the physician to perform the procedure. Patient scheduled for 03/11/20 at 8 am and instructed to report to registration at 6 am.   Patient has allergy to IV contrast, allergy protocol has been taken with Benadryl and prednisone. Patient has paper RX to take to the pharmacy.   Patient will be NPO at midnight.   Patient will have someone to drive them home. Every effort will be made to discharge the patient home same day, however you may need to stay overnight. Suggest packing a small bag of essentials just in case.  Reviewed medication list. Patient is to hold oral diabetic medication day of procedure.    The patient may take all other meds with a sip of water, including aspirin and plavix.  Pre-Procedure/Op needs- Labs BMP, CBC diff and PT/INR (within 7 days of procedure). Recent labs in results review. Labs will be completed at [facility] 03/04/20-03/08/20  Understanding was verbalized of the given information. All questions were answered. Patient was instructed to contact our office if they had any additional questions or concerns about the above scheduled procedure.    Joanne Gavel, LPN  05/08/44, 10:44

## 2020-01-19 NOTE — Progress Notes (Signed)
[facility] HEART [facility] VASCULAR [name] [name]  [address]  [place] [address]  Phone: [phone]  Fax: [phone]    Encounter Date: 01/19/2020    Patient ID:  [name] [name]  MCE:[id number]    DOB:   Age: 79 y.o. female    Subjective:     Chief Complaint   Patient presents with   . Follow Up 3 Months   . Chest Pain    . Shortness of Breath     HPI   [name] [name] is a pleasant 79 y.o. female presenting for follow up. She has known CAD status post PCI to the left circumflex x 2 with most recent stent placed in 2018. She also has a history of diabetes mellitus, hypertension and hyperlipidemia. Due to chest pain, she underwent a Lexiscan MPS in October of 2020 that showed no evidence of ischemia or infarct. A TTE in October of 2020 showed EF 60-65% with diastolic dysfunction and RVSP 24-49 mmHg consistent with severe pulmonary hypertension. Today, she reports that she continues to have intermittent chest pain every couple weeks with associated shortness of breath and nausea. Her symptoms worsen with exertion and resolve with Nitroglycerin. She states her symptoms are very similar to what she experienced prior to having stent placement. She also notes PND and states that she is constantly fatigued. She has had lower extremity edema and has rare palpitations. She denies any syncope or bleeding complications.      Chance of dozing during:  Sitting and reading: 3   Watching TV  3   Sitting inactive in a public place   2   Passenger in a car for an hour without a break  0   Lying down to rest in the afternoon  3   Sitting and talking to someone  0   Sitting quietly after a lunch without alcohol  3   In a car, stopped for a few minutes in traffic  0   Total Score 14         Current Outpatient Medications   Medication Sig   . ALPRAZolam (XANAX) 0.5 mg Oral Tablet Take 0.5 mg by mouth Every evening   . amLODIPine (NORVASC) 10 mg Oral Tablet Take 10 mg by mouth Once a day   . azithromycin (ZITHROMAX) 250 mg Oral Tablet  Take 250 mg by mouth Once a day   . Cholecalciferol, Vitamin D3, (VITAMIN D-3) 10 mcg (400 unit) Oral Tablet, Chewable Take by mouth   . cloNIDine HCL (CATAPRES) 0.1 mg Oral Tablet TAKE 1 TABLET BY MOUTH THREE TIMES A DAY FOR SYSTOLIC BP GREATER THAN OR EQUAL TO 150   . clopidogreL (PLAVIX) 75 mg Oral Tablet Take 75 mg by mouth Once a day   . coenzyme Q10 30 mg Oral Capsule Take 30 mg by mouth Every evening with dinner   . fluticasone propionate (FLONASE NASL) by Nasal route   . Irbesartan-Hydrochlorothiazide 300-12.5 mg Oral Tablet Take 300 mg by mouth Once a day   . isosorbide mononitrate (IMDUR) 60 mg Oral Tablet Sustained Release 24 hr Take 1 Tablet (60 mg total) by mouth Every morning   . levothyroxine (SYNTHROID) 100 mcg Oral Tablet Take 100 mcg by mouth Every morning   . metFORMIN (GLUCOPHAGE) 500 mg Oral Tablet Take 500 mg by mouth Once a day   . metoprolol tartrate (LOPRESSOR) 50 mg Oral Tablet TAKE 1 TABLET BY MOUTH TWICE A DAY   . nitroGLYCERIN (NITROSTAT) 0.4 mg Sublingual Tablet, Sublingual 1  Tab (0.4 mg total) by Sublingual route Every 5 minutes as needed for Chest pain for up to 30 days for 3 doses over 15 minutes   . pantoprazole (PROTONIX) 40 mg Oral Tablet, Delayed Release (E.C.) Take 40 mg by mouth Once a day   . POTASSIUM-99 ORAL Take by mouth   . rosuvastatin (CRESTOR) 10 mg Oral Tablet Take 10 mg by mouth Once a day   . UNKNOWN MEDICATION (UNKNOWN MEDICATION) Tumeric   . vitamin B complex (B COMPLEX-VITAMIN B12 ORAL) Take by mouth     Allergies   Allergen Reactions   . Iv Contrast Hives/ Urticaria   . Nickel Hives/ Urticaria     Past Medical History:   Diagnosis Date   . Coronary artery disease    . Essential hypertension          Past Surgical History:   Procedure Laterality Date   . HX HEART CATHETERIZATION  2017 and 2018    Stented twice  prox circ  and circ   . HX THYROIDECTOMY  1978   . HX WISDOM TEETH EXTRACTION     . KNEE SURGERY  2018    muscle removed   . LAPAROSCOPIC CHOLECYSTECTOMY      . RECONSTRUCTION OF NOSE  1980         Family Medical History:     Problem Relation (Age of Onset)    Cardiomyopathy Father    Heart Attack Mother    Heart Disease Sister            Social History     Tobacco Use   . Smoking status: Former Smoker     Packs/day: 0.50     Years: 10.00     Pack years: 5.00   . Smokeless tobacco: Never Used   Vaping Use   . Vaping Use: Never used   Substance Use Topics   . Alcohol use: Not Currently     Alcohol/week: 1.0 standard drinks     Types: 1 Cans of beer per week     Comment: drank long time ago not current.   . Drug use: Never       Review of Systems   Constitutional: Positive for fatigue. Negative for activity change, chills, diaphoresis and unexpected weight change.   HENT: Negative for hearing loss.    Respiratory: Positive for shortness of breath. Negative for apnea, cough and wheezing.    Cardiovascular: Positive for chest pain, palpitations and leg swelling.   Gastrointestinal: Negative for blood in stool.   Genitourinary: Negative for hematuria.   Neurological: Positive for dizziness (occasional). Negative for syncope, weakness and light-headedness.   Hematological: Does not bruise/bleed easily.   All other systems reviewed and are negative.    Objective:   Vitals: BP (!) 144/74 (Site: Left, Patient Position: Sitting)   Pulse 76   Resp 18   Ht 1.588 m (5' 2.5")   Wt 77.4 kg (170 lb 9.6 oz)   SpO2 96%   BMI 30.71 kg/m         Physical Exam  Vitals and nursing note reviewed.   Constitutional:       General: She is not in acute distress.     Appearance: She is not ill-appearing or diaphoretic.   HENT:      Head: Normocephalic and atraumatic.   Eyes:      General: No scleral icterus.  Cardiovascular:      Rate and Rhythm: Normal rate and regular rhythm.  Pulses: Normal pulses.      Heart sounds: Normal heart sounds. No murmur.   Pulmonary:      Effort: Pulmonary effort is normal. No respiratory distress.      Breath sounds: Normal breath sounds. No wheezing or  rales.   Musculoskeletal:      Cervical back: Normal range of motion and neck supple.      Right lower leg: No edema.      Left lower leg: No edema.   Skin:     General: Skin is warm and dry.   Neurological:      Mental Status: She is alert and oriented to person, place, and time.   Psychiatric:         Behavior: Behavior normal.           Outside labs reviewed.     Assessment & Plan:     ENCOUNTER DIAGNOSES     ICD-10-CM   1. 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 She had a recent Lexiscan MPS that was normal but states that her symptoms are similar to her prior stent placement. Right and left cardiac catheterization scheduled for March 11, 2020 at [facility] due to class III angina and pulmonary hypertension. Risks and benefits were discussed with the patient, and she was agreeable to proceed forward. Consent forms were reviewed and signed. She is to have preop blood work completed prior to the procedure. She was also given a prescription for premedication due to her allergy to IV contrast. Will also increase Imdur to 60 mg once daily. She agreed to call our office with any change in her symptoms or status.  I20.9   2. Pulmonary HTN (CMS HCC): As above. Recommended sleep study to assess for sleep apnea due to elevated Epworth Sleepiness Scale score and symptoms or sleep apnea. She declines at this time.  I27.20   3. CAD in native artery s/p PCI. Due to continued anginal symptoms despite medications, will proceed forward with cardiac catheterization. Will continue Plavix 75 mg once daily, Crestor 10 mg once daily, Lopressor 50 mg twice daily, Irbesartan-Hydrochlorothiazide 300-12.5 mg once daily and Norvasc 10 mg once daily with Nitroglycerin as needed. Will increase Imdur to 60 mg once daily to optimize anti-anginal therapy.  I25.10   4. S/P angioplasty with stent  Z95.820   5. HTN (hypertension): Elevated. Medication changes as above.   I10      6. HLD (hyperlipidemia): Well-controlled. Will continue Crestor as prescribed.   E78.5       Orders Placed This Encounter   . ECG (PERFORMED AT [place])   . CASE REQUEST CATH LAB: R/L HEART CATH W/CORONARY ANGIO W/WO LVG   . isosorbide mononitrate (IMDUR) 60 mg Oral Tablet Sustained Release 24 hr       Return in 3 months (on 04/17/2020), or if symptoms worsen or fail to improve, for follow up after cardiac catheterization.    This patient was seen independently with supervising physician not present, but available for consultation. Case reviewed with Dr. Deatra Canter via telephone.     Tamberlyn Midgley O'Kernick, PA-C

## 2020-01-19 NOTE — Nursing Note (Signed)
Pt did not have a med list with them at visit - went over list with patient   Advised patient to check AVS list with medicines at home and call if any discrepancies    -Confirmed correct pharmacy with patient for e-scribing Patient also states she has had both covid vaccinations.   Ricka Burdock, MA  01/19/2020, 10:10

## 2020-03-02 ENCOUNTER — Ambulatory Visit (INDEPENDENT_AMBULATORY_CARE_PROVIDER_SITE_OTHER): Payer: Self-pay | Admitting: INTERNAL MEDICINE CARDIOVASCULAR DISEASE

## 2020-03-02 NOTE — Telephone Encounter (Addendum)
Phoned patient back she was confused on how to take medication. Patient was given 10 mg tablets instead of 50 mg tablets. Patient was instructed to take (5) 10mg  tablets 13 hrs prior to cath, (5) 10 mg tablets 7 hrs prior to cath and (5) 10 mg tablets 1 hr prior to cath. Patient verbalized understanding and does not have any questions or concerns at this time.    [place], [place]  03/02/2020, 11:35

## 2020-03-02 NOTE — Telephone Encounter (Signed)
-----   Message from Ortencia Kick sent at 03/02/2020 10:56 AM EDT -----  Pt asked if you would call her. Its regarding how she takes Prednisone before her surgery. Thanks.

## 2020-03-07 ENCOUNTER — Other Ambulatory Visit: Payer: Self-pay

## 2020-03-07 ENCOUNTER — Encounter (HOSPITAL_COMMUNITY): Payer: Self-pay | Admitting: INTERNAL MEDICINE CARDIOVASCULAR DISEASE

## 2020-03-07 NOTE — Nursing Note (Signed)
COVID-19 Admission Screen    Low Risk:   Able to Provide asymptomatic History  No recent Travel/ Following Stay at Home  No Sick Contacts  No New Household Conatcts  None    Moderate/High Risk: {None    Test Result:Not Indicated

## 2020-03-11 ENCOUNTER — Ambulatory Visit (HOSPITAL_COMMUNITY): Payer: Medicare PPO | Admitting: INTERNAL MEDICINE CARDIOVASCULAR DISEASE

## 2020-03-11 ENCOUNTER — Encounter (HOSPITAL_COMMUNITY): Payer: Self-pay | Admitting: INTERNAL MEDICINE CARDIOVASCULAR DISEASE

## 2020-03-11 ENCOUNTER — Encounter (HOSPITAL_COMMUNITY)
Admission: RE | Disposition: A | Discharge status: Home / Self Care | Payer: Self-pay | Source: Ambulatory Visit | Attending: INTERNAL MEDICINE CARDIOVASCULAR DISEASE

## 2020-03-11 ENCOUNTER — Inpatient Hospital Stay
Admission: RE | Admit: 2020-03-11 | Discharge: 2020-03-11 | Disposition: A | Discharge status: Home / Self Care | Payer: Medicare PPO | Source: Ambulatory Visit | Attending: INTERNAL MEDICINE CARDIOVASCULAR DISEASE | Admitting: INTERNAL MEDICINE CARDIOVASCULAR DISEASE

## 2020-03-11 ENCOUNTER — Other Ambulatory Visit: Payer: Self-pay

## 2020-03-11 DIAGNOSIS — I25119 Atherosclerotic heart disease of native coronary artery with unspecified angina pectoris: Secondary | ICD-10-CM | POA: Insufficient documentation

## 2020-03-11 DIAGNOSIS — Z955 Presence of coronary angioplasty implant and graft: Secondary | ICD-10-CM

## 2020-03-11 DIAGNOSIS — Z95820 Peripheral vascular angioplasty status with implants and grafts: Secondary | ICD-10-CM

## 2020-03-11 DIAGNOSIS — I272 Pulmonary hypertension, unspecified: Secondary | ICD-10-CM | POA: Insufficient documentation

## 2020-03-11 DIAGNOSIS — I209 Angina pectoris, unspecified: Secondary | ICD-10-CM

## 2020-03-11 HISTORY — DX: Hyperlipidemia, unspecified: E78.5

## 2020-03-11 LAB — ECG 12 LEAD - ADULT
Calculated P Axis: 60 deg
Calculated P Axis: 64 deg
Calculated T Axis: 20 deg
Calculated T Axis: 252 deg
EKG Severity: BORDERLINE
EKG Severity: BORDERLINE
Heart Rate: 75 {beats}/min
Heart Rate: 82 {beats}/min
I 40 Axis: 44 deg
I 40 Axis: 45 deg
PR Interval: 150 ms
PR Interval: 159 ms
QRS Axis: 4 deg
QRS Axis: 11 deg
QRS Duration: 108 ms
QRS Duration: 105 ms
QT Interval: 434 ms
QT Interval: 381 ms
QTC Calculation: 485 ms
QTC Calculation: 445 ms
ST Axis: -9 deg
ST Axis: 225 deg
T 40 Axis: -32 deg
T 40 Axis: 6 deg

## 2020-03-11 LAB — VENOUS CO-OXIMETRY
CARBOXYHEMOGLOBIN: 1.5 % (ref 0.0–3.0)
HEMOGLOBIN: 11.8 g/dL — ABNORMAL LOW (ref 12.0–18.0)
MET-HEMOGLOBIN: 0 % (ref 0.0–1.5)
O2 SATURATION (VENOUS): 77.9 % — ABNORMAL LOW (ref 95.0–98.0)
OXYHEMOGLOBIN: 76.7 % — ABNORMAL LOW (ref 95.0–98.0)

## 2020-03-11 LAB — POC BLOOD GLUCOSE (RESULTS): GLUCOSE, POC: 158 mg/dl — ABNORMAL HIGH (ref 70–110)

## 2020-03-11 SURGERY — R/L HEART CATH W/CORONARY ANGIO W/WO LVG
Anesthesia: Moderate Sedation | Wound class: N/A - Imaging Only

## 2020-03-11 MED ORDER — IODIXANOL 320 MG IODINE/ML INTRAVENOUS SOLUTION
Freq: Once | INTRAVENOUS | Status: DC | PRN
Start: 2020-03-11 — End: 2020-03-11
  Administered 2020-03-11: 110 mL via INTRAVENOUS

## 2020-03-11 MED ORDER — MIDAZOLAM 1 MG/ML INJECTION SOLUTION
Freq: Once | INTRAMUSCULAR | Status: DC | PRN
Start: 2020-03-11 — End: 2020-03-11
  Administered 2020-03-11 (×2): 1 mg via INTRAVENOUS

## 2020-03-11 MED ORDER — SODIUM CHLORIDE 0.9 % INTRAVENOUS SOLUTION
Freq: Once | INTRAVENOUS | Status: DC | PRN
Start: 2020-03-11 — End: 2020-03-11
  Administered 2020-03-11: 09:00:00 1000 mL

## 2020-03-11 MED ORDER — NITROGLYCERIN 0.4 MG SUBLINGUAL TABLET
SUBLINGUAL_TABLET | Freq: Once | SUBLINGUAL | Status: DC | PRN
Start: 2020-03-11 — End: 2020-03-11
  Administered 2020-03-11: 09:00:00 0.4 mg via SUBLINGUAL

## 2020-03-11 MED ORDER — ISOSORBIDE MONONITRATE ER 60 MG TABLET,EXTENDED RELEASE 24 HR
60.00 mg | ORAL_TABLET | Freq: Every day | ORAL | Status: DC
Start: 2020-03-11 — End: 2020-03-11

## 2020-03-11 MED ORDER — FENTANYL (PF) 50 MCG/ML INJECTION SOLUTION
Freq: Once | INTRAMUSCULAR | Status: DC | PRN
Start: 2020-03-11 — End: 2020-03-11
  Administered 2020-03-11: 25 ug via INTRAVENOUS
  Administered 2020-03-11: 50 ug via INTRAVENOUS
  Administered 2020-03-11: 25 ug via INTRAVENOUS

## 2020-03-11 MED ORDER — NITROGLYCERIN 0.4 MG SUBLINGUAL TABLET
0.40 mg | SUBLINGUAL_TABLET | SUBLINGUAL | Status: DC | PRN
Start: 2020-03-11 — End: 2020-03-11

## 2020-03-11 MED ORDER — SODIUM CHLORIDE 0.9 % INTRAVENOUS SOLUTION
INTRAVENOUS | Status: AC | PRN
Start: 2020-03-11 — End: 2020-03-11
  Administered 2020-03-11: 25 mL/h via INTRAVENOUS

## 2020-03-11 MED ORDER — METOPROLOL TARTRATE 5 MG/5 ML INTRAVENOUS SOLUTION
Freq: Once | INTRAVENOUS | Status: DC | PRN
Start: 2020-03-11 — End: 2020-03-11
  Administered 2020-03-11: 5 mg via INTRAVENOUS

## 2020-03-11 MED ORDER — LIDOCAINE HCL 20 MG/ML (2 %) INJECTION SOLUTION
Freq: Once | INTRAMUSCULAR | Status: DC | PRN
Start: 2020-03-11 — End: 2020-03-11
  Administered 2020-03-11: 10 mL via INTRADERMAL

## 2020-03-11 SURGICAL SUPPLY — 40 items
APPL 70% ISPRP 2% CHG 26ML 13._2X13.2IN CHLRPRP PREP DEHP-FR (WOUND CARE/ENTEROSTOMAL SUPPLY) ×2
APPL 70% ISPRP 2% CHG 26ML CHLRPRP HI-LT ORNG PREP STRL LF  DISP CLR (WOUND CARE/ENTEROSTOMAL SUPPLY) ×4
ARMBRD IV ADLT 9X3.5IN PLASTIC_FOAM HNDAD WRST REPL PAD (SUPP) ×1
ARMBRD IV ADULT 9X3.5IN PLASTIC FOAM HNDAD WRST REPL PAD STRAP START BLOCK HKLP CLSR DISP LF  14.5IN (SUPP) ×2 IMPLANT
CATH ANGIO 6FR FL4 CURVE 100CM EXPO WRE BRD RBST SHAFT FEM TRILON (DIAGNOSTIC) ×2
CATH ANGIO 6FR FL4 CURVE 100CM_EXPO WRE BRD RBST SHFT FEM (DIAGNOSTIC) ×1
CATH ANGIO 6FR FR4 CURVE 100CM EXPO FULL LGTH WRE BRD RBST SHAFT VENTRIC TRILON (DIAGNOSTIC) ×2 IMPLANT
CATH ANGIO 6FR FR4 CURVE 100CM_EXPO FULL LGTH WRE BRD RBST (DIAGNOSTIC) ×1
CATH ANGIO 6FR PGTL CURVE 110CM EXPO WRE BRD RBST SHAFT PERI TRILON (DIAGNOSTIC) ×2 IMPLANT
CATH ANGIO 6FR PGTL CURVE 110C_M EXPO WRE BRD RBST SHFT PERI (DIAGNOSTIC) ×1
CATH ART LN SWANGANZ 7FR 110CM_STD 4 LUM TD VOL (Central Venous Lines) ×1
CATH PA 7FR 110CM SWANGANZ 4 LUM TD STD STRL LTX DISP (Central Venous Lines) ×2 IMPLANT
CONV USE ITEM 338637 - PACK SURG UNIV CV STRL DISP LF (CUSTOM TRAYS & PACK) ×2
DISC ITEM - PAD DEFIBR 60IN PHLPS RADIOLUC_ENT ADULT LF (Electrical Supplies) ×3
DRESS TRNSPR 4.75X4IN POLYUR ADH HYPOALL WTPRF TGDRM STRL LF (WOUND CARE SUPPLY) ×1 IMPLANT
DRESS TRNSPR 4.75X4IN POLYUR ADH HYPOALL WTPRF TGDRM STRL LF (WOUND CARE/ENTEROSTOMAL SUPPLY) ×1
DRESSING TAGADERM 4X4_50/BX 1626 (WOUND CARE/ENTEROSTOMAL SUPPLY) ×1
GW GLDWR .035IN 3CM 180CM FLXB TAPER RAIOPAQUE SHP RETENTION COR TO TIP NITINOL HDRPH VAS ANG STD (WIRE) ×2 IMPLANT
GW GLDWR .035IN 3CM 180CM FLXB_COR TO TIP RADOPQ STD SHFT (WIRE) ×1
KIT ANGIO NAMIC MTS CTH LB STRL LF  DISP UN HOSPITAL CNTR (CATHETERS) ×2 IMPLANT
KIT CATH LAB CUSTOM NAVILYST_BX/8 H749602105741 (CATHETERS) ×1
KIT INTROD 10CM 6FR 22GA GLIDESHEATH SLNDR .021IN PLASTIC SHEATH DIL 2 WL PNCT SHORT ANG MINIWIRE 45 (GUIDING) ×2
KIT INTROD 10CM 6FR 22GA GLIDE_SHEATH SLNDR .021IN PLASTIC (GUIDING) ×1
KIT MICROINTRO MNSTCK 7CM MAX 4FR 21GA TUNG NITINOL COAX STIFFEN GW ECHGN NEEDLE STRL DISP (NEEDLES & SYRINGE SUPPLIES) ×2 IMPLANT
KIT MICROINTRO MNSTCK 7CM MAX_4FR 21GA TUNG NITINOL COAX (NEEDLES & SYRINGE SUPPLIES) ×1
KIT NITRO H749602105751 (CUSTOM TRAYS & PACK) ×1
KIT SURG NITRO STRL DISP MTS UN HOSPITAL LF (CUSTOM TRAYS & PACK) ×2 IMPLANT
NEEDLE 7CM 18GA TW WO WNG SHLD ANGIOGRAPY SAF PROC STRL LF  SECURELOC 12ML (NEEDLES & SYRINGE SUPPLIES) IMPLANT
NEEDLE 7CM 18GA TW WO WNG SHLD_ANGIOGRAPY SAF PROC STRL LF (NEEDLES & SYRINGE SUPPLIES)
PACK CARDIOVASCULAR CUST UHC_DYNJ64219A (CUSTOM TRAYS & PACK) ×1
PACK UNIVERSAL CARDIOVASCULAR  - ~~LOC~~ (CUSTOM TRAYS & PACK) ×1 IMPLANT
SENSOR ADULT NLCR LOSAT 18IN C30+ KG OXIMETER SPO2 ADH TEAR RST BANDAGE PVC STRL LF  DISP (OXY) ×2
SENSOR PULSE OX ADULT MAXA_CS/24 DIGIT W/18IN CABLE (OXY) ×1
SHEATH 7FR 11CM 50CM PRELUDE PRO .038IN HUB SNPFT DIL TUBE GW INTROD ORNG (GUIDING) ×2 IMPLANT
SHEATH 7FR 11CM 50CM PRELUDE P_RO .038IN ORNG HUB SNPFT DIL (GUIDING) ×2
SHEATH INTROD 10CM 6FR PINN .035IN PERI SS SNAPON DIL LOCK KINK RST SMOOTH TRNS SPRG COIL GW 2.5CM (INTRODUCER) ×2 IMPLANT
SHEATH INTROD PINNACLE 6FRX10_10/BX RSS601 (INTRODUCER) ×1
SPIKE ANGIO 1 WY STOPCOCK NONP_RESSURIZE VENT FEMALE FIT STRL (Cautery Accessories) ×1
SPIKE ANGIO STD BORE CNRST CONTROLLR VENT FEMALE LL LF  DISP 1.5IN CLR (Cautery Accessories) ×2 IMPLANT
VISIPAQUE 150ML V564 PK/10 (CONTRAST) ×3

## 2020-03-11 NOTE — Nurses Notes (Signed)
Awake and alert, denies pain. Reminded to keep her head down and  Right leg straight. Frequent vital signs along with pedal pulses and groin checks. Taking fluids well husband notified and to room assisting pt with lunch. Ellan Lambert, RN

## 2020-03-11 NOTE — Nurses Notes (Signed)
swcnod NTGgiven. Ellan Lambert, RN

## 2020-03-11 NOTE — H&P (Signed)
[facility]  [facility] MEDICINE [facility]  [address]  [place] [address]  Dept: [phone]  Dept Fax: [phone]  Loc: [phone]    [name] [name] is a pleasant 79 y.o. female presenting for follow up. She has known CAD status post PCI to the left circumflex x 2 with most recent stent placed in 2018. She also has a history of diabetes mellitus, hypertension and hyperlipidemia. Due to chest pain, she underwent a Lexiscan MPS in October of 2020 that showed no evidence of ischemia or infarct. A TTE in October of 2020 showed EF 60-65% with diastolic dysfunction and RVSP 94-76 mmHg consistent with severe pulmonary hypertension. Today, she reports that she continues to have intermittent chest pain every couple weeks with associated shortness of breath and nausea. Her symptoms worsen with exertion and resolve with Nitroglycerin. She states her symptoms are very similar to what she experienced prior to having stent placement. She also notes PND and states that she is constantly fatigued. She has had lower extremity edema and has rare palpitations. She denies any syncope or bleeding complications.      Chance of dozing during:  Sitting and reading: 3   Watching TV  3   Sitting inactive in a public place   2   Passenger in a car for an hour without a break  0   Lying down to rest in the afternoon  3   Sitting and talking to someone  0   Sitting quietly after a lunch without alcohol  3   In a car, stopped for a few minutes in traffic  0   Total Score 14              Current Outpatient Medications   Medication Sig    ALPRAZolam (XANAX) 0.5 mg Oral Tablet Take 0.5 mg by mouth Every evening    amLODIPine (NORVASC) 10 mg Oral Tablet Take 10 mg by mouth Once a day    azithromycin (ZITHROMAX) 250 mg Oral Tablet Take 250 mg by mouth Once a day    Cholecalciferol, Vitamin D3, (VITAMIN D-3) 10 mcg (400 unit) Oral Tablet, Chewable Take by mouth    cloNIDine HCL (CATAPRES) 0.1 mg Oral  Tablet TAKE 1 TABLET BY MOUTH THREE TIMES A DAY FOR SYSTOLIC BP GREATER THAN OR EQUAL TO 150    clopidogreL (PLAVIX) 75 mg Oral Tablet Take 75 mg by mouth Once a day    coenzyme Q10 30 mg Oral Capsule Take 30 mg by mouth Every evening with dinner    fluticasone propionate (FLONASE NASL) by Nasal route    Irbesartan-Hydrochlorothiazide 300-12.5 mg Oral Tablet Take 300 mg by mouth Once a day    isosorbide mononitrate (IMDUR) 60 mg Oral Tablet Sustained Release 24 hr Take 1 Tablet (60 mg total) by mouth Every morning    levothyroxine (SYNTHROID) 100 mcg Oral Tablet Take 100 mcg by mouth Every morning    metFORMIN (GLUCOPHAGE) 500 mg Oral Tablet Take 500 mg by mouth Once a day    metoprolol tartrate (LOPRESSOR) 50 mg Oral Tablet TAKE 1 TABLET BY MOUTH TWICE A DAY    nitroGLYCERIN (NITROSTAT) 0.4 mg Sublingual Tablet, Sublingual 1 Tab (0.4 mg total) by Sublingual route Every 5 minutes as needed for Chest pain for up to 30 days for 3 doses over 15 minutes    pantoprazole (PROTONIX) 40 mg Oral Tablet, Delayed Release (E.C.) Take 40 mg by mouth Once a day    POTASSIUM-99 ORAL  Take by mouth    rosuvastatin (CRESTOR) 10 mg Oral Tablet Take 10 mg by mouth Once a day    UNKNOWN MEDICATION (UNKNOWN MEDICATION) Tumeric    vitamin B complex (B COMPLEX-VITAMIN B12 ORAL) Take by mouth          Allergies   Allergen Reactions    Iv Contrast Hives/ Urticaria    Nickel Hives/ Urticaria          Past Medical History:   Diagnosis Date    Coronary artery disease     Essential hypertension                Past Surgical History:   Procedure Laterality Date    HX HEART CATHETERIZATION  2017 and 2018    Stented twice  prox circ  and circ    HX THYROIDECTOMY  1978    HX WISDOM TEETH EXTRACTION      KNEE SURGERY  2018    muscle removed    LAPAROSCOPIC CHOLECYSTECTOMY      RECONSTRUCTION OF NOSE  1980              Family Medical History:        Problem Relation (Age of Onset)    Cardiomyopathy Father       Heart Attack Mother    Heart Disease Sister               Social History           Tobacco Use    Smoking status: Former Smoker     Packs/day: 0.50     Years: 10.00     Pack years: 5.00    Smokeless tobacco: Never Used   Haematologist Use: Never used   Substance Use Topics    Alcohol use: Not Currently     Alcohol/week: 1.0 standard drinks     Types: 1 Cans of beer per week     Comment: drank long time ago not current.    Drug use: Never       Review of Systems   Constitutional: Positive for fatigue. Negative for activity change, chills, diaphoresis and unexpected weight change.   HENT: Negative for hearing loss.    Respiratory: Positive for shortness of breath. Negative for apnea, cough and wheezing.    Cardiovascular: Positive for chest pain, palpitations and leg swelling.   Gastrointestinal: Negative for blood in stool.   Genitourinary: Negative for hematuria.   Neurological: Positive for dizziness (occasional). Negative for syncope, weakness and light-headedness.   Hematological: Does not bruise/bleed easily.   All other systems reviewed and are negative.    Objective:   Vitals: BP (!) 144/74 (Site: Left, Patient Position: Sitting)    Pulse 76    Resp 18    Ht 1.588 m (5' 2.5")    Wt 77.4 kg (170 lb 9.6 oz)    SpO2 96%    BMI 30.71 kg/m         Physical Exam  Vitals and nursing note reviewed.   Constitutional:       General: She is not in acute distress.     Appearance: She is not ill-appearing or diaphoretic.   HENT:      Head: Normocephalic and atraumatic.   Eyes:      General: No scleral icterus.  Cardiovascular:      Rate and Rhythm: Normal rate and regular rhythm.      Pulses: Normal pulses.  Heart sounds: Normal heart sounds. No murmur.   Pulmonary:      Effort: Pulmonary effort is normal. No respiratory distress.      Breath sounds: Normal breath sounds. No wheezing or rales.   Musculoskeletal:      Cervical back: Normal range of motion and neck supple.       Right lower leg: No edema.      Left lower leg: No edema.   Skin:     General: Skin is warm and dry.   Neurological:      Mental Status: She is alert and oriented to person, place, and time.   Psychiatric:         Behavior: Behavior normal.           Outside labs reviewed.     Assessment & Plan:          ENCOUNTER DIAGNOSES     ICD-10-CM   1. Angina, class III (CMS [place]): She notes continued dyspnea and chest pain despite medication changes. She had a recent Lexiscan MPS that was normal but states that her symptoms are similar to her prior stent placement. Right and left cardiac catheterization scheduled for March 11, 2020 at [facility] due to class III angina and pulmonary hypertension. Risks and benefits were discussed with the patient, and she was agreeable to proceed forward. Consent forms were reviewed and signed. She is to have preop blood work completed prior to the procedure. She was also given a prescription for premedication due to her allergy to IV contrast. Will also increase Imdur to 60 mg once daily. She agreed to call our office with any change in her symptoms or status.  I20.9   2. Pulmonary HTN (CMS HCC): As above. Recommended sleep study to assess for sleep apnea due to elevated Epworth Sleepiness Scale score and symptoms or sleep apnea. She declines at this time.  I27.20   3. CAD in native artery s/p PCI. Due to continued anginal symptoms despite medications, will proceed forward with cardiac catheterization. Will continue Plavix 75 mg once daily, Crestor 10 mg once daily, Lopressor 50 mg twice daily, Irbesartan-Hydrochlorothiazide 300-12.5 mg once daily and Norvasc 10 mg once daily with Nitroglycerin as needed. Will increase Imdur to 60 mg once daily to optimize anti-anginal therapy.  I25.10   4. S/P angioplasty with stent  Z95.820   5. HTN (hypertension): Elevated. Medication changes as above.   I10   6. HLD (hyperlipidemia): Well-controlled. Will continue Crestor as  prescribed.   E78.5     Risks and benefits of cardiac catheterization discussed with patient she verbalized understanding agreed to proceed.

## 2020-03-11 NOTE — Nurses Notes (Signed)
Up and ambulated in hall, no bleeding noted. Shakyra Mattera, RN

## 2020-03-11 NOTE — Nurses Notes (Signed)
Sander Radon visited and orders received , NTG gr 1/150 given . Ellan Lambert, RN

## 2020-03-11 NOTE — Nurses Notes (Signed)
Voided 500 cc clear. C/o chest pain 8/10 pressure from mid chest to back NTG gr 1/150 given sl at 11:15. Dr. Brooke Dare notified. Ellan Lambert, RN

## 2020-03-21 ENCOUNTER — Ambulatory Visit (INDEPENDENT_AMBULATORY_CARE_PROVIDER_SITE_OTHER): Payer: Self-pay | Admitting: INTERNAL MEDICINE CARDIOVASCULAR DISEASE

## 2020-03-21 NOTE — Telephone Encounter (Addendum)
Called patient, patient stated that she felt "under the weather on Wednesday and Thursday of last week but feels fine now."   Informed patient to call us back if she needed anything or had any questions.  Josem Kaufmann, RN  03/21/2020, 12:57      ----- Message from Ortencia Kick sent at 03/21/2020 10:50 AM EDT -----  Pt left message and said that she had a procedure with Dr.Ali at [facility]. She stated she has not felt well since and vomited. She asked if you would call her.

## 2020-04-18 ENCOUNTER — Ambulatory Visit (INDEPENDENT_AMBULATORY_CARE_PROVIDER_SITE_OTHER): Payer: Medicare PPO | Admitting: INTERNAL MEDICINE CARDIOVASCULAR DISEASE

## 2020-04-18 ENCOUNTER — Encounter (INDEPENDENT_AMBULATORY_CARE_PROVIDER_SITE_OTHER): Payer: Self-pay | Admitting: INTERNAL MEDICINE CARDIOVASCULAR DISEASE

## 2020-04-18 ENCOUNTER — Other Ambulatory Visit: Payer: Self-pay

## 2020-04-18 VITALS — BP 112/41 | HR 50 | Resp 18 | Ht 62.5 in | Wt 170.0 lb

## 2020-04-18 DIAGNOSIS — I83819 Varicose veins of unspecified lower extremities with pain: Secondary | ICD-10-CM

## 2020-04-18 DIAGNOSIS — I10 Essential (primary) hypertension: Secondary | ICD-10-CM

## 2020-04-18 DIAGNOSIS — Z95820 Peripheral vascular angioplasty status with implants and grafts: Secondary | ICD-10-CM

## 2020-04-18 DIAGNOSIS — I272 Pulmonary hypertension, unspecified: Secondary | ICD-10-CM

## 2020-04-18 DIAGNOSIS — E785 Hyperlipidemia, unspecified: Secondary | ICD-10-CM

## 2020-04-18 DIAGNOSIS — I2510 Atherosclerotic heart disease of native coronary artery without angina pectoris: Secondary | ICD-10-CM

## 2020-04-18 MED ORDER — EZETIMIBE 10 MG TABLET
10.00 mg | ORAL_TABLET | Freq: Every evening | ORAL | 3 refills | Status: DC
Start: 2020-04-18 — End: 2021-03-30

## 2020-04-18 MED ORDER — ISOSORBIDE MONONITRATE ER 120 MG TABLET,EXTENDED RELEASE 24 HR
120.00 mg | ORAL_TABLET | Freq: Every morning | ORAL | 0 refills | Status: DC
Start: 2020-04-18 — End: 2020-08-01

## 2020-04-18 NOTE — Patient Instructions (Addendum)
Evolocumab injection  Brand Name: Repatha  What is this medicine?  EVOLOCUMAB (e voe LOK ue mab) is known as a PCSK9 inhibitor. It is used to lower the level of cholesterol in the blood. It may be used alone or in combination with other cholesterol-lowering drugs. This drug may also be used to reduce the risk of heart attack, stroke, and certain types of heart surgery in patients with heart disease.  How should I use this medicine?  This medicine is for injection under the skin. You will be taught how to prepare and give this medicine. Use exactly as directed. Take your medicine at regular intervals. Do not take your medicine more often than directed.  It is important that you put your used needles and syringes in a special sharps container. Do not put them in a trash can. If you do not have a sharps container, call your pharmacist or health care provider to get one.  Talk to your pediatrician regarding the use of this medicine in children. While this drug may be prescribed for children as young as 13 years for selected conditions, precautions do apply.  What side effects may I notice from receiving this medicine?  Side effects that you should report to your doctor or health care professional as soon as possible:   allergic reactions like skin rash, itching or hives, swelling of the face, lips, or tongue   signs and symptoms of high blood sugar such as dizziness; dry mouth; dry skin; fruity breath; nausea; stomach pain; increased hunger or thirst; increased urination   signs and symptoms of infection like fever or chills; cough; sore throat; pain or trouble passing urine  Side effects that usually do not require medical attention (report to your doctor or health care professional if they continue or are bothersome):   diarrhea   nausea   muscle pain   pain, redness, or irritation at site where injected  What may interact with this medicine?  Interactions are not expected.  What if I miss a dose?  If you  miss a dose, take it as soon as you can if there are more than 7 days until the next scheduled dose, or skip the missed dose and take the next dose according to your original schedule. Do not take double or extra doses.  Where should I keep my medicine?  Keep out of the reach of children.  You will be instructed on how to store this medicine. Throw away any unused medicine after the expiration date on the label.  What should I tell my health care provider before I take this medicine?  They need to know if you have any of these conditions:   an unusual or allergic reaction to evolocumab, other medicines, foods, dyes, or preservatives   pregnant or trying to get pregnant   breast-feeding  What should I watch for while using this medicine?  Visit your health care provider for regular checks on your progress. Tell your health care provider if your symptoms do not start to get better or if they get worse.  You may need blood work done while you are taking this drug.  Do not wear the on-body infuser during an MRI.  NOTE:This sheet is a summary. It may not cover all possible information. If you have questions about this medicine, talk to your doctor, pharmacist, or health care provider. Copyright 2021 Elsevier        Alirocumab injection  Brand Name: Praluent  What is this  medicine?  ALIROCUMAB (al i ROC ue mab) is known as a PCSK9 inhibitor. It is used to lower the level of cholesterol in the blood. It may be used alone or in combination with other cholesterol-lowering drugs. This drug may also be used to reduce the risk of heart attack, stroke, and certain types of chest pain (unstable angina) that may need hospitalization.  How should I use this medicine?  This medicine is for injection under the skin. You will be taught how to prepare and give this medicine. Use exactly as directed. Take your medicine at regular intervals. Do not take your medicine more often than directed.  It is important that you put your used  needles and syringes in a special sharps container. Do not put them in a trash can. If you do not have a sharps container, call your pharmacist or healthcare provider to get one.  Talk to your pediatrician regarding the use of this medicine in children. Special care may be needed.  What side effects may I notice from receiving this medicine?  Side effects that you should report to your doctor or health care professional as soon as possible:   allergic reactions like skin rash, itching or hives, swelling of the face, lips, or tongue   signs and symptoms of infection like fever or chills; cough; sore throat; pain or trouble passing urine   signs and symptoms of liver injury like dark yellow or brown urine; general ill feeling or flu-like symptoms; light-colored stools; loss of appetite; nausea; right upper belly pain; unusually weak or tired; yellowing of the eyes or skin  Side effects that usually do not require medical attention (report to your doctor or health care professional if they continue or are bothersome):   diarrhea   muscle cramps   muscle pain   pain, redness, or irritation at site where injected  What may interact with this medicine?  Interactions are not expected.  What if I miss a dose?  If you are on an every 2 week schedule and miss a dose, take it as soon as you can. If your next dose is to be taken in less than 7 days, then do not take the missed dose. Take the next dose at your regular time. Do not take double or extra doses. If you are on a monthly schedule and miss a dose, take it as soon as you can. If the dose given is within 7 days of the missed dose, continue with your regular monthly schedule. If the missed dose is administered after 7 days of the original date, administer the dose and start a new monthly schedule based on this date.  Where should I keep my medicine?  Keep out of the reach of children.  You will be instructed on how to store this medicine. Throw away any unused  medicine after the expiration date on the label.  What should I tell my health care provider before I take this medicine?  They need to know if you have any of these conditions:   any unusual or allergic reaction to alirocumab, other medicines, foods, dyes, or preservatives   pregnant or trying to get pregnant   breast-feeding  What should I watch for while using this medicine?  You may need blood work done while you are taking this medicine.  NOTE:This sheet is a summary. It may not cover all possible information. If you have questions about this medicine, talk to your doctor, pharmacist, or health care   provider. Copyright 2021 Elsevier        Varicose Veins  Varicose veins are swollen, enlarged veins most often found in the legs.They are usually blue or purple in color and may bulge, twist, and stand out under the skin.   Normally, veins return blood from the body to the heart. The leg veins have one-way valves that prevent blood from flowing backward in the vein. When the valves are weak or damaged, blood backs up in the veins. This may cause some of the veins to swell and bulge and become varicose veins.   Symptoms  Varicose veins may or may not cause symptoms. If symptoms do occur, they can include:   Legs that feel tired, achy, heavy, or itchy   Leg muscle cramps   Skin changes, such as discoloration, dryness, redness, or rash (in more severe cases, you may also have sores on the skin called venous leg ulcers)  Risk factors  There are a number of factors that increase the risk for varicose veins. These can include:    Being a woman   Being older   Sitting or standing for long periods   Being overweight   Being pregnant   Having a family history of varicose veins  Treatment starts with simple self-help measures (see below). If these don't help, there are many procedures that can be done to shrink or remove varicose veins.Your healthcare provider can tell you more about these options, if needed.    Home care   Support or compression stockings will likely be prescribed. If so, be sure to wear them as directed. They may help improve blood flow.   Exercising helps strengthen your leg muscles and improve blood flow. To get the most benefit, choose exercises such as walking, swimming, or cycling. Also try to exercise for at least 30 minutes on most days.   Raising (elevating) your legs lets gravity help blood flow back to the heart. Sit or lie with your feet above heart level a few times throughout the day, or as directed.   Don't sit or stand for long periods. Change positions often.Also, move your ankles, toes and knees often.This may also help improve blood flow.   If you are overweight, talk with your healthcare provider about setting up a weight-loss plan.Maintaining a healthy weight can help reduce the strain on your veins. It may also improve symptoms, such as swelling and aching.   If you have dryness and itching, ask your provider about special lotions that can be applied to the skin to help improve symptoms.    Follow-up care  Follow up with your healthcare provider, or as directed.If imaging tests were done, you'll be told the results and if there are any new findings that affect your care.   When to seek medical advice  Call your healthcare provider right away if any of these occur:   Sudden, severe leg swelling, pain, or redness   Symptoms worsen, or they don't improve with self-care   Bleedingfrom any affected veins   Ulcersform on the legs, ankles, or feet   Fever of 100.83F (38C) or higher, or as advised by your provider  StayWell last reviewed this educational content on 03/03/2017   2000-2021 [employer], [place]. All rights reserved. This information is not intended as a substitute for professional medical care. Always follow your healthcare professional's instructions.        Radiofrequency Ablation (RFA) Treatment for Varicose Veins  Radiofrequency ablation (RFA) is a used  to treatvaricose veins. It uses heat made from radiofrequency (RF).  Varicose veins are swollen, enlarged veins. They happen most often in the legs. Varicose veins can occur when valves in your veins are damaged. This causes problems with blood flow. Over time, too much blood collects in your veins. The veins may bulge, twist, and stand out under your skin. They can also cause symptoms such as aching, cramping, or swelling in your legs.  During RFA, heat is sent into your vein through a thin, flexible tube (catheter). This closes off blood flow in the main problem vein.     With RFA treatment, a catheter that contains RF heat is used to seal off the main problem vein.   Getting readyfor your treatment  Follow any instructions from your healthcare provider.  Tell your provider if you:   Are pregnant or think you could be   Are breastfeeding   Smoke or use alcohol on a regular basis   Have any allergies or can't take certain medicines. Tell your provider how you reacted to these medicines in the past.  Tell your provider about any medicines you are taking. You may need to stop taking all or some of these before the test. This includes:   Medicines that can thin your blood or prevent clotting (anticoagulants)   All prescription medicines   Over-the-counter medicines such as aspirin or ibuprofen   Street drugs   Herbs, vitamins, and other supplements  Follow any directions you're given for not eating or drinking before the procedure.  The day of your treatment  The treatment takes 45 to 60 minutes. The whole treatment (from preparing to recovery) takes about 1 to 3 hours.You can go home the same day. For the treatment:   You'll lie down on a hospital bed.   An imaging method, such as ultrasound, is used to guide the procedure.   The leg to be treated is injected with numbing medicine.   Once your leg is numb, a needle makes a small hole (puncture) inthe vein to be treated.   The catheter with the RF  heat source is inserted into your vein.   More numbing medicine may be injected around your vein.   Once the catheter is in the right position, it's slowly drawn backward. As the catheter sends out heat, the vein is closed off.   In some cases, other side branch varicose veins may be removed or tied off through a few small cuts (incisions).   When the treatment is done, the catheter is removed. Pressure is applied to the insertion site to stop any bleeding. An elastic compression stocking or a bandage may be put on your leg.  Recovering at home  Once at home, follow all the instructions you've been given. Be sure to:   Take all medicines as directed   Care for the catheter insertion site as advised   Check for signs of infection at the catheter insertion site (see below)   Wear elastic stockings or bandages as directed   Raise your legs as directed   Walk a few times a day   Don't do any heavy exercise, lifting, or standing for long periods, as advised   Don't travel by air   Don't take hot baths, or use saunas, or whirlpools, as advised  Call your healthcare provider  Call your healthcare provider if you have any of the following:   Fever of 100.4 F ( 38 C) or higher, or as directed  by your provider   Chest pain or trouble breathing   Signs of infection at the catheter insertion site. These includeincreased redness or swelling (inflammation), warmth, increasing pain, bleeding, or bad-smelling discharge.   Severe numbness or tingling in the treated leg   Severe pain or swelling in the treated leg  Follow-up  You'll have a follow-up visit with your healthcare provider within a week. An ultrasound will be done to check for problems, such as blood clots. Yourprovider will discuss further treatments with you, if needed.  Risks and possible complications  These include the following:   Bleeding   Infection   Blood clots   Nerve damage in the treated area   Irritation or burning of the skin  over the treated vein   Treatmentdoesn't improve thelook or the symptoms of the problem veins   Risks of any medicines used during the treatment  StayWell last reviewed this educational content on 10/03/2018   2000-2021 [employer], [place]. All rights reserved. This information is not intended as a substitute for professional medical care. Always follow your healthcare professional's instructions.

## 2020-04-18 NOTE — Progress Notes (Addendum)
CARDIOLOGY [facility]  [address]  [place] [address]  Phone: [phone]  Fax: [phone]    Encounter Date: 04/18/2020    Patient ID:  [name] [name]  KTG:[id number]    DOB:   Age: 79 y.o. female    Subjective:     Chief Complaint   Patient presents with   . Follow Up 3 Months   . Edema     ankle/feet swelling   . Shortness of Breath     states she keeps, and its her allergies      79 y.o. female presenting for follow up. She has known CAD status post PCI to the left circumflex x 2 with most recent stent placed in 2018. She also has a history of diabetes mellitus, hypertension and hyperlipidemia. Due to chest pain, she underwent a Lexiscan MPS in October of 2020 that showed no evidence of ischemia or infarct. A TTE in October of 2020 showed EF 60-65% with diastolic dysfunction and RVSP 34-28 mmHg consistent with severe pulmonary hypertension.      Shortness of Breath  This is a recurrent problem. The current episode started more than 1 month ago. The problem occurs daily. The problem has been gradually improving. Associated symptoms include leg pain and leg swelling.       Leg symptoms  Onset of the patients symptoms began approximately several month(s) ago. Symptoms include leg pain that the patient describes as aching, heaviness, burning, cramping.  The patient also admits to leg swelling that is present upon waking in the mornings, is increasingly more severe. The patient's symptoms interfere with normal activities of daily living by causing her to stop and elevate the legs and limiting endurance      Current Outpatient Medications   Medication Sig   . ALPRAZolam (XANAX) 0.5 mg Oral Tablet Take 0.5 mg by mouth Every evening   . amLODIPine (NORVASC) 10 mg Oral Tablet Take 10 mg by mouth Once a day (Patient not taking: Reported on 04/18/2020)   . Cholecalciferol, Vitamin D3, (VITAMIN D-3) 10 mcg (400 unit) Oral Tablet, Chewable Take by mouth   .  cloNIDine HCL (CATAPRES) 0.1 mg Oral Tablet TAKE 1 TABLET BY MOUTH THREE TIMES A DAY FOR SYSTOLIC BP GREATER THAN OR EQUAL TO 150   . clopidogreL (PLAVIX) 75 mg Oral Tablet Take 75 mg by mouth Once a day   . diphenhydrAMINE (BENADRYL) 50 mg Oral Capsule Take 1 capsule by mouth 1 hour prior to Cath   . ezetimibe (ZETIA) 10 mg Oral Tablet Take 1 Tablet (10 mg total) by mouth Every evening for 90 days   . fluticasone propionate (FLONASE NASL) by Nasal route   . Irbesartan-Hydrochlorothiazide 300-12.5 mg Oral Tablet Take 300 mg by mouth Once a day   . Isosorbide Mononitrate (IMDUR) 120 mg Oral Tablet Sustained Release 24 hr Take 1 Tablet (120 mg total) by mouth Every morning for 90 days   . levothyroxine (SYNTHROID) 100 mcg Oral Tablet Take 100 mcg by mouth Every morning   . metFORMIN (GLUCOPHAGE) 500 mg Oral Tablet Take 500 mg by mouth Once a day   . metoprolol tartrate (LOPRESSOR) 50 mg Oral Tablet TAKE 1 TABLET BY MOUTH TWICE A DAY (Patient taking differently: Take 50 mg by mouth Twice daily )   . nitroGLYCERIN (NITROSTAT) 0.4 mg Sublingual Tablet, Sublingual 1 Tab (0.4 mg total) by Sublingual route Every 5 minutes as needed for Chest pain for up to 30  days for 3 doses over 15 minutes   . pantoprazole (PROTONIX) 40 mg Oral Tablet, Delayed Release (E.C.) Take 40 mg by mouth Once a day   . POTASSIUM-99 ORAL Take by mouth   . predniSONE (DELTASONE) 50 mg Oral Tablet Take 1 tablet by mouth 13 hours prior to Cath, 7 hours prior to Cath, and 1 hour prior to Cath.   . vitamin B complex (B COMPLEX-VITAMIN B12 ORAL) Take by mouth     Allergies   Allergen Reactions   . Iv Contrast Hives/ Urticaria   . Nickel Hives/ Urticaria     Past Medical History:   Diagnosis Date   . Coronary artery disease    . Essential hypertension    . Hyperlipidemia          Past Surgical History:   Procedure Laterality Date   . HX HEART CATHETERIZATION  2017 and 2018    Stented twice  prox circ  and circ   . HX THYROIDECTOMY  1978   . HX WISDOM TEETH  EXTRACTION     . KNEE SURGERY  2018    muscle removed   . LAPAROSCOPIC CHOLECYSTECTOMY     . RECONSTRUCTION OF NOSE  1980         Family Medical History:     Problem Relation (Age of Onset)    Cardiomyopathy Father    Heart Attack Mother    Heart Disease Sister            Social History     Tobacco Use   . Smoking status: Former Smoker     Packs/day: 0.50     Years: 10.00     Pack years: 5.00   . Smokeless tobacco: Never Used   Vaping Use   . Vaping Use: Never used   Substance Use Topics   . Alcohol use: Not Currently     Alcohol/week: 1.0 standard drinks     Types: 1 Cans of beer per week     Comment: drank long time ago not current.   . Drug use: Never       Review of Systems   Respiratory: Positive for shortness of breath.    Cardiovascular: Positive for leg swelling.     Objective:   Vitals: BP (!) 112/41 (Site: Left)   Pulse 50   Resp 18   Ht 1.588 m (5' 2.5")   Wt 77.1 kg (170 lb)   SpO2 98%   BMI 30.60 kg/m         Physical Exam      Cardiac Cath 03/2020  HEMODYNAMIC / ANGIOGRAPHIC DATA:       Baseline Post Nitro   RA 16/15 (15)    RV 48/18    PA 49/24 (36) 36/17(26)   PCWP 27/28 (22) 18/18 (16)     1. Cardiac index was 4.73 by fick and 3.23 by TD. PA sat 78%  2. Left ventricular end diastolic pressure was 10 mmHg.  3. The left main is patent.  4. The LAD is is tortuous proximally with a 40% lesion.  5. The LCx is non dominant with a patent stent in it  6. The right coronary artery is dominant and free of disease.  7. WIth the pigtail in the infra-renal aorta an image was obtained that showed patent iliac arteries bilaterally with 40% ostial CIA lesions    RECOMMENDATIONS:  Post-procedure care will focus on prevention of any ischemic events and congestive complications.  Films were  reviewed with Dr.Wolz the absence of anterior wall ischemia with nuclear examination was felt best to proceed medical treatment.  Given the patient's chest pain resolved and hemodynamics improved with sublingual  nitroglycerin long-acting nitrate is recommended.  A pulmonary artery pressure of 36 is consistent pulmonary hypertension and likely secondary to cardiac cause it improved with sublingual nitroglycerin.  Baseline wedge pressure was elevated and came down with sublingual nitro.    Thornell Sartorius, MD  03/11/2020, 09:24                Assessment & Plan:     ENCOUNTER DIAGNOSES     ICD-10-CM   1. Pulmonary HTN (CMS HCC)  I27.20   2. CAD in native artery  I25.10   3. Hypertension, unspecified type  I10   4. Hyperlipidemia, unspecified hyperlipidemia type  E78.5   5. S/P angioplasty with stent  Z95.820     Shortness of breath  Patient's symptoms are noted upon exertion, by virtue of which they are likely secondary to the pulmonary hypertension. Further treatment discussed and patient decided to go along with doubling the long acting nitrate as it did help her symptoms.    Sleep Apnea  Multiple features of sleep apnea, , Epsworth score on prior note, patient agreed to proceed with sleep study and referral to sleep medicine if needed. She wants to wait for now.    CAD with lipid abnormal with statin intolerance  Discussed with the patient regarding injectables she wants to hold off for now, she is taking the Crestor every other day currently. Add Zetia.    Varicose Veins  Based on history and physical examination  patient has chronic venous disease.  Discussed with patient about leg elevation, exercise, weight loss, use of stockings and over-the-counter nonsteroidals or Tylenol as needed.  Will obtain a vein ultrasound examination and then sees response consider treatment decide regarding need for intervention on a follow-up visit.  Natural history of venous disease discussed the patient.    Patient has noted a bradycardic today however she is not very fatigued at the moment did not feel comfortable cutting back on the beta-blocker.    Orders Placed This Encounter   . LIPID PANEL   . LIPID PANEL   . ECG WITH  INTERPRETATION/REPORT (AMB ONLY)   . HOLTER MONITOR 48 HOURS   . Isosorbide Mononitrate (IMDUR) 120 mg Oral Tablet Sustained Release 24 hr   . ezetimibe (ZETIA) 10 mg Oral Tablet   This note may have been partially generated using M[employer] Fluency Direct system, and there may be some incorrect words, spellings, and punctuation that were not noted in checking the note before saving.      Return in about 3 months (around 07/19/2020).    Thornell Sartorius, MD

## 2020-04-18 NOTE — Nursing Note (Signed)
Pt did not have a med list with them at visit - went over list with patient   Advised patient to check AVS list with medicines at home and call if any discrepancies    -Confirmed correct pharmacy with patient for e-scribing   Joanne Gavel, LPN  8/65/7846, 10:11

## 2020-06-01 DIAGNOSIS — R002 Palpitations: Secondary | ICD-10-CM

## 2020-06-01 DIAGNOSIS — I8390 Asymptomatic varicose veins of unspecified lower extremity: Secondary | ICD-10-CM

## 2020-06-07 ENCOUNTER — Other Ambulatory Visit (INDEPENDENT_AMBULATORY_CARE_PROVIDER_SITE_OTHER): Payer: Self-pay

## 2020-06-07 DIAGNOSIS — I83819 Varicose veins of unspecified lower extremities with pain: Secondary | ICD-10-CM

## 2020-06-20 ENCOUNTER — Other Ambulatory Visit (INDEPENDENT_AMBULATORY_CARE_PROVIDER_SITE_OTHER): Payer: Self-pay

## 2020-06-20 DIAGNOSIS — I272 Pulmonary hypertension, unspecified: Secondary | ICD-10-CM

## 2020-06-20 DIAGNOSIS — I10 Essential (primary) hypertension: Secondary | ICD-10-CM

## 2020-06-20 DIAGNOSIS — Z95820 Peripheral vascular angioplasty status with implants and grafts: Secondary | ICD-10-CM

## 2020-06-20 DIAGNOSIS — I2510 Atherosclerotic heart disease of native coronary artery without angina pectoris: Secondary | ICD-10-CM

## 2020-06-20 DIAGNOSIS — E785 Hyperlipidemia, unspecified: Secondary | ICD-10-CM

## 2020-07-13 ENCOUNTER — Other Ambulatory Visit (INDEPENDENT_AMBULATORY_CARE_PROVIDER_SITE_OTHER): Payer: Self-pay | Admitting: Medical

## 2020-07-20 ENCOUNTER — Encounter (INDEPENDENT_AMBULATORY_CARE_PROVIDER_SITE_OTHER): Payer: Self-pay | Admitting: Medical

## 2020-07-31 ENCOUNTER — Other Ambulatory Visit (INDEPENDENT_AMBULATORY_CARE_PROVIDER_SITE_OTHER): Payer: Self-pay | Admitting: Medical

## 2020-08-01 ENCOUNTER — Other Ambulatory Visit (INDEPENDENT_AMBULATORY_CARE_PROVIDER_SITE_OTHER): Payer: Self-pay | Admitting: INTERNAL MEDICINE CARDIOVASCULAR DISEASE

## 2020-08-01 MED ORDER — ISOSORBIDE MONONITRATE ER 120 MG TABLET,EXTENDED RELEASE 24 HR
120.00 mg | ORAL_TABLET | Freq: Every morning | ORAL | 3 refills | Status: DC
Start: 2020-08-01 — End: 2021-07-28

## 2020-09-07 ENCOUNTER — Other Ambulatory Visit: Payer: Self-pay

## 2020-09-07 ENCOUNTER — Ambulatory Visit (INDEPENDENT_AMBULATORY_CARE_PROVIDER_SITE_OTHER): Payer: Medicare PPO | Admitting: Medical

## 2020-09-07 ENCOUNTER — Encounter (INDEPENDENT_AMBULATORY_CARE_PROVIDER_SITE_OTHER): Payer: Self-pay | Admitting: Medical

## 2020-09-07 VITALS — BP 154/57 | HR 60 | Resp 18 | Ht 62.5 in | Wt 166.7 lb

## 2020-09-07 DIAGNOSIS — M79602 Pain in left arm: Secondary | ICD-10-CM

## 2020-09-07 DIAGNOSIS — E785 Hyperlipidemia, unspecified: Secondary | ICD-10-CM

## 2020-09-07 DIAGNOSIS — I2510 Atherosclerotic heart disease of native coronary artery without angina pectoris: Secondary | ICD-10-CM

## 2020-09-07 DIAGNOSIS — I10 Essential (primary) hypertension: Secondary | ICD-10-CM

## 2020-09-07 DIAGNOSIS — Z95820 Peripheral vascular angioplasty status with implants and grafts: Secondary | ICD-10-CM

## 2020-09-07 NOTE — Progress Notes (Signed)
CARDIOLOGY [facility]  [address]  [place] [address]  Phone: [phone]  Fax: [phone]    Encounter Date: 09/07/2020    Patient ID:  [name] [name]  EXB:[id number]    DOB:   Age: 79 y.o. female    Subjective:     Chief Complaint   Patient presents with   . Follow Up 3 Months   . No Complaints     HPI   [name] [name] is a pleasant 79 y.o. female presenting for follow up. She has known CAD status post PCI to the left circumflex x 2 with most recent stent placed in 2018. She also has a history of diabetes mellitus, hypertension and hyperlipidemia. Due to chest pain, she underwent a Lexiscan MPS in October of 2020 that showed no evidence of ischemia or infarct. A TTE in October of 2020 showed EF 60-65% with diastolic dysfunction and RVSP 01-02 mmHg consistent with severe pulmonary hypertension. She most recently underwent cardiac catheterization in April 2021 that showed patent stents with nonobstructive CAD in the LAD and pulmonary artery pressure of 36 consistent with pulmonary hypertension likely secondary to cardiac causes pressure improved with sublingual Nitroglycerin. She wore a 48 hour Holter monitor in July of 2021 that showed symptoms correlating with sinus rhythm and no significant arrhythmias. Today, she reports that she has intermittent chest pain that she notes is not necessarily worsen with exertion. Her symptoms have improved since her medications were adjusted after cardiac catheterization. She reports unchanged dyspnea on exertion when walking through the parking lot. She also notes a knot and discomfort along a vein in her left upper extremity that she recently noticed. She denies any swelling in her arms and notes resolved lower extremity edema since she stopped Amlodipine. She denies any palpitations, dizziness, syncope or bleeding complications.      Current Outpatient Medications   Medication Sig   . ALPRAZolam (XANAX) 0.5 mg  Oral Tablet Take 0.5 mg by mouth Every evening   . Cholecalciferol, Vitamin D3, (VITAMIN D-3) 10 mcg (400 unit) Oral Tablet, Chewable Take by mouth   . cloNIDine HCL (CATAPRES) 0.1 mg Oral Tablet TAKE 1 TABLET BY MOUTH THREE TIMES A DAY FOR SYSTOLIC BP GREATER THAN OR EQUAL TO 150   . clopidogreL (PLAVIX) 75 mg Oral Tablet Take 75 mg by mouth Once a day   . diphenhydrAMINE (BENADRYL) 50 mg Oral Capsule Take 1 capsule by mouth 1 hour prior to Cath   . ezetimibe (ZETIA) 10 mg Oral Tablet Take 1 Tablet (10 mg total) by mouth Every evening for 90 days   . fluticasone propionate (FLONASE NASL) by Nasal route   . Irbesartan-Hydrochlorothiazide 300-12.5 mg Oral Tablet Take 300 mg by mouth Once a day   . Isosorbide Mononitrate (IMDUR) 120 mg Oral Tablet Sustained Release 24 hr Take 1 Tablet (120 mg total) by mouth Every morning   . levothyroxine (SYNTHROID) 100 mcg Oral Tablet Take 100 mcg by mouth Every morning   . metFORMIN (GLUCOPHAGE) 500 mg Oral Tablet Take 500 mg by mouth Once a day   . metoprolol tartrate (LOPRESSOR) 50 mg Oral Tablet TAKE 1 TABLET BY MOUTH TWICE A DAY (Patient taking differently: Take 50 mg by mouth Twice daily )   . nitroGLYCERIN (NITROSTAT) 0.4 mg Sublingual Tablet, Sublingual 1 Tab (0.4 mg total) by Sublingual route Every 5 minutes as needed for Chest pain for up to 30 days for 3 doses over 15 minutes   .  pantoprazole (PROTONIX) 40 mg Oral Tablet, Delayed Release (E.C.) Take 40 mg by mouth Once a day   . POTASSIUM-99 ORAL Take by mouth   . predniSONE (DELTASONE) 50 mg Oral Tablet Take 1 tablet by mouth 13 hours prior to Cath, 7 hours prior to Cath, and 1 hour prior to Cath.   . vitamin B complex (B COMPLEX-VITAMIN B12 ORAL) Take by mouth     Allergies   Allergen Reactions   . Iv Contrast Hives/ Urticaria   . Nickel Hives/ Urticaria     Past Medical History:   Diagnosis Date   . Coronary artery disease    . Essential hypertension    . Hyperlipidemia          Past Surgical History:   Procedure  Laterality Date   . HX HEART CATHETERIZATION  2017 and 2018    Stented twice  prox circ  and circ   . HX THYROIDECTOMY  1978   . HX WISDOM TEETH EXTRACTION     . KNEE SURGERY  2018    muscle removed   . LAPAROSCOPIC CHOLECYSTECTOMY     . RECONSTRUCTION OF NOSE  1980         Family Medical History:     Problem Relation (Age of Onset)    Cardiomyopathy Father    Heart Attack Mother    Heart Disease Sister            Social History     Tobacco Use   . Smoking status: Former Smoker     Packs/day: 0.50     Years: 10.00     Pack years: 5.00   . Smokeless tobacco: Never Used   Vaping Use   . Vaping Use: Never used   Substance Use Topics   . Alcohol use: Not Currently     Alcohol/week: 1.0 standard drink     Types: 1 Cans of beer per week     Comment: drank long time ago not current.   . Drug use: Never       Review of Systems   Constitutional: Negative for activity change, fatigue and unexpected weight change.   HENT: Negative for hearing loss.    Respiratory: Positive for shortness of breath. Negative for cough and wheezing.    Cardiovascular: Positive for chest pain. Negative for palpitations and leg swelling.   Gastrointestinal: Negative for blood in stool.   Genitourinary: Negative for hematuria.   Skin: Negative for color change.   Neurological: Negative for dizziness, syncope, weakness and light-headedness.   Hematological: Does not bruise/bleed easily.   All other systems reviewed and are negative.    Objective:   Vitals: BP (!) 154/57 (Site: Left, Patient Position: Sitting)   Pulse 60   Resp 18   Ht 1.588 m (5' 2.5")   Wt 75.6 kg (166 lb 11.2 oz)   SpO2 97%   BMI 30.00 kg/m         Physical Exam  Vitals and nursing note reviewed.   Constitutional:       General: She is not in acute distress.     Appearance: She is not ill-appearing or diaphoretic.   HENT:      Head: Normocephalic and atraumatic.   Eyes:      General: No scleral icterus.  Cardiovascular:      Rate and Rhythm: Normal rate and regular rhythm.       Pulses: Normal pulses.      Heart sounds: Normal heart sounds.  No murmur heard.        Comments: tender to palpation in left forearm concerning for possible phlebitis vs. clot  Pulmonary:      Effort: Pulmonary effort is normal. No respiratory distress.      Breath sounds: Normal breath sounds. No wheezing or rales.   Musculoskeletal:      Cervical back: Normal range of motion and neck supple.      Right lower leg: No edema.      Left lower leg: No edema.   Skin:     General: Skin is warm and dry.   Neurological:      Mental Status: She is alert and oriented to person, place, and time.   Psychiatric:         Behavior: Behavior normal.         Assessment & Plan:     ENCOUNTER DIAGNOSES     ICD-10-CM   1. CAD in native artery s/p PCI. Chest pain has improved since her Imdur was increased. Most recent catheterization showed nonobstructive CAD. Will continue GDMT with Plavix, Zetia, Lopressor, Irbesartan and Imdur. She agreed to call our office with any change in her symptoms or status.  I25.10   2. S/P angioplasty with stent  Z95.820   3. HTN (hypertension): Mildly elevated today. She agreed to monitor her BP at home and call us with readings in 1 week. She notes that she has been taking Clonidine 0.1 mg twice daily. I encouraged her to only taking Clonidine as needed if systolic BP is greater than 160 and to monitor her BP. Will adjust medications as needed based on home readings.  I10   4. HLD (hyperlipidemia): She has been unable to tolerate statin therapy and declined injectables. She remains on Zetia 10 mg once daily. Will obtain most recent lipid panel.  Goal LDL less than 70.  E78.5   5. Pain of left upper extremity concerning for possible phlebitis versus clot. Will obtain a left upper extremity venous duplex for further evaluation. T55.732       Orders Placed This Encounter   . PERIPHERAL VENOUS DUPLEX - UPPER       Return in about 6 months (around 03/08/2021), or if symptoms worsen or fail to improve, for  routine follow up.    This patient was seen independently with supervising physician not present, but available for consultation.    Ashlee O'Kernick, PA-C

## 2020-09-07 NOTE — Nursing Note (Signed)
Pt did not have a med list with them at visit - went over list with patient   Advised patient to check AVS list with medicines at home and call if any discrepancies    -Confirmed correct pharmacy with patient for e-scribing   Ricka Burdock, MA  09/07/2020, 13:45

## 2020-10-07 ENCOUNTER — Other Ambulatory Visit: Payer: Self-pay | Admitting: Interventional Cardiology

## 2020-10-14 DIAGNOSIS — M79602 Pain in left arm: Secondary | ICD-10-CM

## 2020-11-04 ENCOUNTER — Other Ambulatory Visit (INDEPENDENT_AMBULATORY_CARE_PROVIDER_SITE_OTHER): Payer: Self-pay | Admitting: INTERNAL MEDICINE CARDIOVASCULAR DISEASE

## 2020-12-29 ENCOUNTER — Other Ambulatory Visit (INDEPENDENT_AMBULATORY_CARE_PROVIDER_SITE_OTHER): Payer: Self-pay | Admitting: Medical

## 2021-01-01 ENCOUNTER — Other Ambulatory Visit (INDEPENDENT_AMBULATORY_CARE_PROVIDER_SITE_OTHER): Payer: Self-pay | Admitting: Physician Assistant

## 2021-02-17 ENCOUNTER — Ambulatory Visit (INDEPENDENT_AMBULATORY_CARE_PROVIDER_SITE_OTHER): Payer: Self-pay | Admitting: INTERNAL MEDICINE CARDIOVASCULAR DISEASE

## 2021-02-17 NOTE — Telephone Encounter (Signed)
Patient phoned into the office states that when she lays down she gets dizzy patient has no complaints of ear pain. This AM her BP was 165/70. Please advise what to tell patient.    Thanks,  [place], [place]  02/17/2021, 10:51

## 2021-02-17 NOTE — Telephone Encounter (Signed)
Please see if she is having dizziness with positional change, or any other associated symptoms with the dizziness, such as palpitations or diaphoresis. If she is having dizziness with positional changes, we can bring her in for a nurse visit to check orthostatics. Otherwise, she may need to follow up with her PCP for evaluation.     Thanks!   Turkey Shomo-Cross, NP 02/17/2021, 15:57         I phoned patient she states she has no other symptoms dizziness when lying down. Patient was instructed to follow up with her PCP for evaluation and if they feel she needs to be seen they can contact our office and we can make an appointment. No additional questions or concerns at this time.    [place], [place]  02/17/2021, 16:20

## 2021-03-05 ENCOUNTER — Other Ambulatory Visit (INDEPENDENT_AMBULATORY_CARE_PROVIDER_SITE_OTHER): Payer: Self-pay | Admitting: INTERNAL MEDICINE CARDIOVASCULAR DISEASE

## 2021-03-10 ENCOUNTER — Encounter (INDEPENDENT_AMBULATORY_CARE_PROVIDER_SITE_OTHER): Payer: Self-pay | Admitting: INTERNAL MEDICINE CARDIOVASCULAR DISEASE

## 2021-03-10 ENCOUNTER — Other Ambulatory Visit: Payer: Self-pay

## 2021-03-10 ENCOUNTER — Ambulatory Visit (INDEPENDENT_AMBULATORY_CARE_PROVIDER_SITE_OTHER): Payer: Medicare PPO | Admitting: INTERNAL MEDICINE CARDIOVASCULAR DISEASE

## 2021-03-10 VITALS — BP 124/54 | HR 58 | Resp 18 | Ht 62.5 in | Wt 168.5 lb

## 2021-03-10 DIAGNOSIS — I272 Pulmonary hypertension, unspecified: Secondary | ICD-10-CM

## 2021-03-10 DIAGNOSIS — I2510 Atherosclerotic heart disease of native coronary artery without angina pectoris: Secondary | ICD-10-CM

## 2021-03-10 DIAGNOSIS — Z95820 Peripheral vascular angioplasty status with implants and grafts: Secondary | ICD-10-CM

## 2021-03-10 DIAGNOSIS — I10 Essential (primary) hypertension: Secondary | ICD-10-CM

## 2021-03-10 DIAGNOSIS — E785 Hyperlipidemia, unspecified: Secondary | ICD-10-CM

## 2021-03-10 DIAGNOSIS — Z789 Other specified health status: Secondary | ICD-10-CM

## 2021-03-10 NOTE — Progress Notes (Addendum)
[facility]  [address]  [place] [id number]  Phone: [phone]  Fax: [phone]    Encounter Date: 03/10/2021    Patient ID:  [name] CannyCarol [name]  [id number]:[id number]    DOB:   Age: 80 y.o. female    Subjective:     Chief Complaint   Patient presents with   . Follow Up 6 Months   . Shortness of Breath   . Dizziness     "When I lay down in bed I get dizzy"   . Leg Pain     80 year old who has known CAD status post PCI to the left circumflex x 2 with most recent stent placed in 2018. She also has a history of diabetes mellitus, hypertension and hyperlipidemia. Due to chest pain, she underwent a Lexiscan MPS in October of 2020 that showed no evidence of ischemia or infarct. A TTE in October of 2020 showed EF 60-65% with diastolic dysfunction and RVSP 28-4155-60 mmHg consistent with severe pulmonary hypertension. She most recently underwent cardiac catheterization in April 2021 that showed patent stents with nonobstructive CAD in the LAD and pulmonary artery pressure of 36 consistent with pulmonary hypertension likely secondary to cardiac causes pressure improved with sublingual Nitroglycerin.      Shortness of Breath  This is a recurrent problem. Associated symptoms include leg pain. Pertinent negatives include no chest pain, claudication or leg swelling. The symptoms are aggravated by exercise. She has tried rest for the symptoms. Her past medical history is significant for CAD.   Dizziness  Associated symptoms include shortness of breath. Pertinent negatives include no chest pain.   Leg Pain  This is a recurrent problem. The current episode started more than 1 week ago. The problem occurs constantly. The problem has been gradually worsening. Associated symptoms include shortness of breath. Pertinent negatives include no chest pain. Exacerbated by: statin use. Nothing relieves the symptoms. She has tried acetaminophen for the symptoms. The treatment provided  no relief.     Current Outpatient Medications   Medication Sig   . ALPRAZolam (XANAX) 0.5 mg Oral Tablet Take 0.5 mg by mouth Every evening   . Cholecalciferol, Vitamin D3, 10 mcg (400 unit) Oral Tablet, Chewable Take by mouth   . cloNIDine HCL (CATAPRES) 0.1 mg Oral Tablet TAKE 1 TABLET BY MOUTH THREE TIMES A DAY AS NEEDED FOR SYSTOLIC BP GREATER THAN OR EQUAL TO 150   . clopidogreL (PLAVIX) 75 mg Oral Tablet Take 75 mg by mouth Once a day   . diphenhydrAMINE (BENADRYL) 50 mg Oral Capsule Take 1 capsule by mouth 1 hour prior to Cath   . ezetimibe (ZETIA) 10 mg Oral Tablet Take 1 Tablet (10 mg total) by mouth Every evening for 90 days   . fluticasone propionate (FLONASE NASL) by Nasal route   . Irbesartan-Hydrochlorothiazide 300-12.5 mg Oral Tablet Take 300 mg by mouth Once a day   . Isosorbide Mononitrate (IMDUR) 120 mg Oral Tablet Sustained Release 24 hr Take 1 Tablet (120 mg total) by mouth Every morning   . levothyroxine (SYNTHROID) 100 mcg Oral Tablet Take 100 mcg by mouth Every morning   . metFORMIN (GLUCOPHAGE) 500 mg Oral Tablet Take 500 mg by mouth Once a day   . metoprolol tartrate (LOPRESSOR) 50 mg Oral Tablet Take 1 Tablet (50 mg total) by mouth Twice daily   . nitroGLYCERIN (NITROSTAT) 0.4 mg Sublingual Tablet, Sublingual PLACE 1 TABLET UNDER TONGUE EVERY 5 MINS, UP  TO 3 DOSES AS NEEDED FOR CHEST PAIN   . pantoprazole (PROTONIX) 40 mg Oral Tablet, Delayed Release (E.C.) Take 40 mg by mouth Once a day   . POTASSIUM-99 ORAL Take by mouth   . predniSONE (DELTASONE) 50 mg Oral Tablet Take 1 tablet by mouth 13 hours prior to Cath, 7 hours prior to Cath, and 1 hour prior to Cath.   . vitamin B complex (B COMPLEX-VITAMIN B12 ORAL) Take by mouth     Allergies   Allergen Reactions   . Iv Contrast Hives/ Urticaria   . Nickel Hives/ Urticaria     Past Medical History:   Diagnosis Date   . Coronary artery disease    . Essential hypertension    . Hyperlipidemia          Past Surgical History:   Procedure Laterality  Date   . HX HEART CATHETERIZATION  2017 and 2018    Stented twice  prox circ  and circ   . HX THYROIDECTOMY  1978   . HX WISDOM TEETH EXTRACTION     . KNEE SURGERY  2018    muscle removed   . LAPAROSCOPIC CHOLECYSTECTOMY     . RECONSTRUCTION OF NOSE  1980         Family Medical History:     Problem Relation (Age of Onset)    Cardiomyopathy Father    Heart Attack Mother    Heart Disease Sister          Social History     Tobacco Use   . Smoking status: Former Smoker     Packs/day: 0.50     Years: 10.00     Pack years: 5.00   . Smokeless tobacco: Never Used   Vaping Use   . Vaping Use: Never used   Substance Use Topics   . Alcohol use: Not Currently     Alcohol/week: 1.0 standard drink     Types: 1 Cans of beer per week     Comment: drank long time ago not current.   . Drug use: Never       Review of Systems   Constitutional: Negative for fatigue.   Respiratory: Positive for shortness of breath. Negative for chest tightness.    Cardiovascular: Negative for chest pain, palpitations, claudication and leg swelling.   Gastrointestinal: Negative for blood in stool.   Genitourinary: Negative for hematuria.   Musculoskeletal: Positive for myalgias.   Neurological: Positive for dizziness. Negative for syncope.   Hematological: Does not bruise/bleed easily.   All other systems reviewed and are negative.    Objective:   Vitals: BP (!) 124/54 (Site: Left, Patient Position: Sitting)   Pulse 58   Resp 18   Ht 1.588 m (5' 2.5")   Wt 76.4 kg (168 lb 8 oz)   SpO2 97%   BMI 30.33 kg/m         Physical Exam  Vitals and nursing note reviewed.   Constitutional:       Appearance: She is well-developed.   HENT:      Head: Normocephalic.   Eyes:      Pupils: Pupils are equal, round, and reactive to light.   Neck:      Vascular: No JVD.   Cardiovascular:      Rate and Rhythm: Normal rate and regular rhythm.      Heart sounds: Normal heart sounds. No murmur heard.  Pulmonary:      Effort: Pulmonary effort is normal.  Breath sounds:  Normal breath sounds.   Abdominal:      General: Bowel sounds are normal.      Palpations: Abdomen is soft.   Musculoskeletal:         General: No deformity. Normal range of motion.      Cervical back: Normal range of motion.   Skin:     General: Skin is warm and dry.   Neurological:      Mental Status: She is alert and oriented to person, place, and time.      Cranial Nerves: No cranial nerve deficit.         Assessment & Plan:     ENCOUNTER DIAGNOSES     ICD-10-CM   1. CAD in native artery  I25.10   2. S/P angioplasty with stent  Z95.820   3. HTN (hypertension)  I10   4. Pulmonary HTN (CMS HCC)  I27.20   5. 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 I have personally reviewed, and independently interpreted, all available imaging and clinical information. I then personally formulated and directed the detailed management plan as documented above inclusive of my edits.     My substantive findings and recommendations are:  1.  Coronary artery disease status post PCI in 2018    Patient is on guideline directed medical treatment as tolerated.    Beta blocker on metoprolol  ACE Inhibitor on irbesartan  Cholesterol lowering agent on Zetia and statins with significant myalgias  Antiplatelet agent on Plavix  Continue to monitor for symptoms.       03/10/21 1102   Medications   May patient stay on anticoagulant and antiplatelet medications for the procedure (if applicable)? No - continue to next question   Is neuraxial anesthesia planned for this procedure?   To Be Determined   Duration to Hold   Antiplatelet drug #1 to hold: Clopidogrel (Plavix)   Duration to hold antiplatelet drug #1: hold x5 days   Earliest anticipated restart time for ANTIPLATELET #1. For planning purposes only. Surgeon/proceduralist will confirm all restarting plans post procedure 24 hours       2. Statin Intolerance  Patient has significant statin  intolerance with aches and pains and  established CAD and or PVD and is  to see the lipid clinic regarding further advice. I have discussed alternative medications available to statins as well as risk of MI / vascular events if lipid disorder is untreated.  Patient has a hard time getting up from sitting position, her legs hurt, venous insufficiency examination failed to show any evidence of reflux disease.  Stocking use has not helped her leg complaints.  She has tried Tylenol on a daily basis and legs still hurt her.  She is not willing to try it twice a month injectable but is willing to consider twice a year injectable.    3. Pulmonary hypertension which symptomatic leave improved a lot with addition of long-acting nitrate continue the same.    Patient has had a single episode of chest discomfort in the last month.    As part of this service, I spent 15 minutes on the clinical encounter, in independent patient care and counseling without APP overlap. This substantive direct/indirect care included initial evaluation, review of laboratory, radiology, diagnostic/imaging studies, review of medical record, order entry and coordination of care with other providers.     Please feel free to  contact me should you have any questions.     Thank you for your referral to the [facility].    Sincerely,      Ronnie Doss          Orders Placed This Encounter   . Refer to [facility])   This note may have been partially generated using MModal Fluency Direct system, and there may be some incorrect words, spellings, and punctuation that were not noted in checking the note before saving.      Return in about 6 months (around 09/09/2021).    Thornell Sartorius, MD

## 2021-03-10 NOTE — Nursing Note (Signed)
Pt did not have a med list with them at visit - went over list with patient   Advised patient to check AVS list with medicines at home and call if any discrepancies    -Confirmed correct pharmacy with patient for e-scribing     [place], MA  03/10/2021, 10:33

## 2021-03-14 ENCOUNTER — Telehealth (HOSPITAL_COMMUNITY): Payer: Self-pay | Admitting: Cardiovascular Disease

## 2021-03-14 NOTE — Telephone Encounter (Addendum)
Mailed [facility] consent form to pt.Jaci Standard, RN  03/14/2021, 15:08    [name], [name] Gladden, MD  Jaci Standard, RN  Victorino Dike   Please send her a [facility] form. Dr. Karie Mainland would like to see if she can get on the med.

## 2021-03-29 ENCOUNTER — Other Ambulatory Visit (INDEPENDENT_AMBULATORY_CARE_PROVIDER_SITE_OTHER): Payer: Self-pay | Admitting: INTERNAL MEDICINE CARDIOVASCULAR DISEASE

## 2021-04-26 ENCOUNTER — Encounter (HOSPITAL_COMMUNITY): Payer: Self-pay | Admitting: Cardiovascular Disease

## 2021-05-24 ENCOUNTER — Other Ambulatory Visit: Payer: Self-pay

## 2021-05-24 ENCOUNTER — Ambulatory Visit: Payer: Medicare PPO | Attending: INTERNAL MEDICINE CARDIOVASCULAR DISEASE | Admitting: Cardiovascular Disease

## 2021-05-24 ENCOUNTER — Encounter (HOSPITAL_COMMUNITY): Payer: Self-pay | Admitting: Cardiovascular Disease

## 2021-05-24 DIAGNOSIS — Z789 Other specified health status: Secondary | ICD-10-CM | POA: Insufficient documentation

## 2021-05-24 DIAGNOSIS — E785 Hyperlipidemia, unspecified: Secondary | ICD-10-CM | POA: Insufficient documentation

## 2021-05-24 LAB — LIPOPROTEIN (A), SERUM: LIPOPROTEIN (A): 28.1

## 2021-05-24 LAB — ALT (SGPT): ALT (SGPT): 18

## 2021-05-24 LAB — LIPID PANEL
CHOLESTEROL: 271 — ABNORMAL HIGH
HDL-CHOLESTEROL: 41 — ABNORMAL LOW
LDL (CALCULATED): 169 — ABNORMAL HIGH
TRIGLYCERIDES: 304 — ABNORMAL HIGH

## 2021-05-24 LAB — HGA1C (HEMOGLOBIN A1C WITH EST AVG GLUCOSE)
ESTIMATED AVERAGE GLUCOSE: 160
HEMOGLOBIN A1C: 7.2 — ABNORMAL HIGH

## 2021-05-24 LAB — LDL CHOLESTEROL, DIRECT: LDL CHOLESTEROL,DIRECT: 180 — ABNORMAL HIGH

## 2021-05-24 LAB — THYROID STIMULATING HORMONE WITH FREE T4 REFLEX
THYROID STIMULATING HORMONE WITH FREE T4 REFLEX: 0.977
THYROXINE (T4), FREE: 1.19 ng/dL

## 2021-05-24 LAB — AST (SGOT): AST (SGOT): 18

## 2021-05-24 LAB — APOLIPOPROTEIN B,SERUM: APOLIPOPROTEIN B, PLASMA: 138 — ABNORMAL HIGH

## 2021-05-24 NOTE — Patient Instructions (Signed)
If not tolerating medicine, call my office [phone] or MyChart me.  If bloodwork outside [facility], remind them to fax to Dr Trisa Cranor.  If blood is drawn and have not heard from us, call my office.

## 2021-05-24 NOTE — Nursing Note (Signed)
Pt did not have a med list with them at visit - went over list with patient   Advised patient to check AVS list with medicines at home and call if any discrepancies    -Confirmed correct pharmacy with patient for e-scribing   Lorane Gell, RN  05/24/2021, 13:06

## 2021-05-24 NOTE — H&P (Signed)
[facility] AND [facility]  DEPARTMENT OF MEDICINE  [place], [place] [address]  [facility] Telemedicine    05/24/2021    Requesting Physician: Thornell Sartorius, MD  History of Present Illness  [name] [name] is a 80 y.o. female who presents to the lipid clinic today via [place] telemedicine video visit for a new patient evaluation pertaining to hyperlipidemia management, within the setting of known ASCVD. Patient possesses a past medical history significant for CAD s/p PCI x3 with most recent in 2017 and 2018, type II diabetes mellitus, pulmonary HTN, HTN, and hyperlipidemia with statin intolerance. She is established with Dr. Karie Mainland.   The patient's dyslipidemia is not currently managed with medications. She has been prescribed multiple statins in the past and discontinued all due to myalgias. She has filled out paperwork for [employer] and is awaiting approval from her insurance. No other concerns or complaints at this time.     Hyperlipidemia Hx:  In regards to past hyperlipidemia treatment, patient was previously prescribed Lipitor, Crestor, Simvastatin, Lovastatin (Altoprev). She discontinued the aforementioned statins due to leg myalgias. Additionally, patient admits to previous usage of Zetia. She reports taking Crestor and Zetia together and developed myalgias within 6 months of starting. She discontinued both Crestor and Zetia 2 months ago and notes an improvement in her myalgias. Patient's lipid panel from 08/31/2019 revealed a total cholesterol of 169, LDL of 87, TGs of 191, and HDL of 50.   After hearing about the initial evaluation concerning the pricing of Leqvio, she prefers to pursue the every 2 week PCSK9 inhibitors.    Diet and Lifestyle:  Patient consumes a large amount of lean beef, milk on her cereal, occasional cheese, and occasional real butter to cook. She does not use coconut oil.  No significant egg intake.    Past History  Current Outpatient Medications   Medication Sig    ALPRAZolam (XANAX) 0.5 mg  Oral Tablet Take 0.5 mg by mouth Every evening    Cholecalciferol, Vitamin D3, 10 mcg (400 unit) Oral Tablet, Chewable Take by mouth    cloNIDine HCL (CATAPRES) 0.1 mg Oral Tablet TAKE 1 TABLET BY MOUTH THREE TIMES A DAY AS NEEDED FOR SYSTOLIC BP GREATER THAN OR EQUAL TO 150    clopidogreL (PLAVIX) 75 mg Oral Tablet Take 75 mg by mouth Once a day    diphenhydrAMINE (BENADRYL) 50 mg Oral Capsule Take 1 capsule by mouth 1 hour prior to Cath    ezetimibe (ZETIA) 10 mg Oral Tablet TAKE 1 TABLET (10 MG TOTAL) BY MOUTH EVERY EVENING (Patient not taking: Reported on 05/24/2021)    fluticasone propionate (FLONASE NASL) by Nasal route    Irbesartan-Hydrochlorothiazide 300-12.5 mg Oral Tablet Take 300 mg by mouth Once a day    Isosorbide Mononitrate (IMDUR) 120 mg Oral Tablet Sustained Release 24 hr Take 1 Tablet (120 mg total) by mouth Every morning    levothyroxine (SYNTHROID) 100 mcg Oral Tablet Take 100 mcg by mouth Every morning    metFORMIN (GLUCOPHAGE) 500 mg Oral Tablet Take 500 mg by mouth Once a day (Patient not taking: Reported on 05/24/2021)    metoprolol tartrate (LOPRESSOR) 50 mg Oral Tablet Take 1 Tablet (50 mg total) by mouth Twice daily    nitroGLYCERIN (NITROSTAT) 0.4 mg Sublingual Tablet, Sublingual PLACE 1 TABLET UNDER TONGUE EVERY 5 MINS, UP TO 3 DOSES AS NEEDED FOR CHEST PAIN    pantoprazole (PROTONIX) 40 mg Oral Tablet, Delayed Release (E.C.) Take 40 mg by mouth Once a day  POTASSIUM-99 ORAL Take by mouth    predniSONE (DELTASONE) 50 mg Oral Tablet Take 1 tablet by mouth 13 hours prior to Cath, 7 hours prior to Cath, and 1 hour prior to Cath.    TRADJENTA 5 mg Oral Tablet Take 5 mg by mouth Once a day    valACYclovir (VALTREX) 500 mg Oral Tablet Take 500 mg by mouth Twice daily    vitamin B complex (B COMPLEX-VITAMIN B12 ORAL) Take by mouth     Allergies   Allergen Reactions    Crestor [Rosuvastatin]      Muscle aches    Lovastatin     Statin [Atorvastatin]      Muscle ache    Iv Contrast Hives/  Urticaria    Nickel Hives/ Urticaria     Past Medical History:   Diagnosis Date    Coronary artery disease     Essential hypertension     Hyperlipidemia      Past Surgical History:   Procedure Laterality Date    HX HEART CATHETERIZATION  2017 and 2018    Stented twice  prox circ  and circ    HX THYROIDECTOMY  1978    HX WISDOM TEETH EXTRACTION      KNEE SURGERY  2018    muscle removed    LAPAROSCOPIC CHOLECYSTECTOMY      RECONSTRUCTION OF NOSE  1980     Family History  Family Medical History:       Problem Relation (Age of Onset)    Cardiomyopathy Father    Heart Attack Mother    Heart Disease Sister          Social History  Social History     Socioeconomic History    Marital status: Married   Tobacco Use    Smoking status: Former Smoker     Packs/day: 0.50     Years: 10.00     Pack years: 5.00    Smokeless tobacco: Never Used   Haematologist Use: Never used   Substance and Sexual Activity    Alcohol use: Not Currently     Alcohol/week: 1.0 standard drink     Types: 1 Cans of beer per week     Comment: drank long time ago not current.    Drug use: Never     Review of Systems  Other than ROS in the HPI, all other systems were negative.  Examination    General Appearance: Patients appears well    BP (!) 104/48 (Site: Left, Patient Position: Sitting)   Pulse 60   Ht 1.588 m (5' 2.5")   Wt 76.4 kg (168 lb 6.4 oz)   SpO2 98% Comment: ra  BMI 30.31 kg/m     Diagnosis and Plan  1. Hyperlipidemia, unspecified hyperlipidemia type    2. Statin intolerance    3.     ASCVD  4.     Diabetes mellitus  5.     Hypothyroidism    Orders Placed This Encounter    LIPID PANEL    LDL CHOLESTEROL, DIRECT    APOLIPOPROTEIN B,SERUM    LIPOPROTEIN (A), SERUM    THYROID STIMULATING HORMONE WITH FREE T4 REFLEX    HGA1C (HEMOGLOBIN A1C WITH EST AVG GLUCOSE)    AST (SGOT)    ALT (SGPT)     [name] [name] is a 80 y.o. female who presents to the lipid clinic today via [place] telemedicine as a new patient to establish follow up pertaining  to management of her hyperlipidemia, within the setting of known ASCVD. Patient also possesses a past medical history significant for CAD s/p PCI x3 with most recent in 2017 and 2018, type II diabetes mellitus, pulmonary HTN, HTN, and hyperlipidemia with statin intolerance. She is established with Dr. Karie Mainland.     The patient's dyslipidemia is not currently managed with medications. Recent lipid panel revealed an LDL of 87, above goal of <70. I will obtain blood work in clinic today. Depending upon those results will determine our next step, likely make application for PCSK9i therapy which her niece would administer the injections as she is a Engineer, civil (consulting).  This is her preferred option.  She has filled out paperwork for [facility] which we discussed the copay and possible assistance available.  She was not enthusiastic to pursue Leqvio at this time.  Nevertheless, we will be in communication via telephone in regards to her final decision of treatment. Follow up will be determined at a later date.     On the day of the encounter, a total of  45 minutes was spent on this patient encounter including review of historical information, examination, documentation and post-visit activities.         TELEMEDICINE DOCUMENTATION:    Patient Location:  [facility] [address], [place], [place] [address]    Patient/family aware of provider location:  yes  Patient/family consent for telemedicine:  yes  Examination observed and performed by:   Dr. Dorita Fray        I am scribing for, and in the presence of, Dr. Dorita Fray, MD for services provided on 05/24/2021.  Marlowe Sax, [place]     I personally performed the services described in this documentation, as scribed  in my presence, and it is both accurate  and complete.    Hale Bogus MD, MD      Burman Foster, MD  Professor, Section of Cardiology  [place] Department of Medicine    Please CC:  Marko Stai, PA-C  [facility] [address]  [place] [address]

## 2021-05-30 ENCOUNTER — Other Ambulatory Visit (HOSPITAL_COMMUNITY): Payer: Self-pay

## 2021-05-30 DIAGNOSIS — Z789 Other specified health status: Secondary | ICD-10-CM

## 2021-05-30 DIAGNOSIS — E785 Hyperlipidemia, unspecified: Secondary | ICD-10-CM

## 2021-05-31 ENCOUNTER — Telehealth (HOSPITAL_COMMUNITY): Payer: Self-pay | Admitting: Nurse Practitioner

## 2021-05-31 ENCOUNTER — Other Ambulatory Visit: Payer: Self-pay

## 2021-05-31 DIAGNOSIS — I2510 Atherosclerotic heart disease of native coronary artery without angina pectoris: Secondary | ICD-10-CM

## 2021-05-31 DIAGNOSIS — Z789 Other specified health status: Secondary | ICD-10-CM

## 2021-05-31 DIAGNOSIS — E785 Hyperlipidemia, unspecified: Secondary | ICD-10-CM

## 2021-05-31 MED ORDER — REPATHA SURECLICK 140 MG/ML SUBCUTANEOUS PEN INJECTOR
PEN_INJECTOR | SUBCUTANEOUS | 12 refills | Status: DC
Start: 2021-05-31 — End: 2022-01-02
  Filled 2021-05-31: qty 2, 28d supply, fill #0

## 2021-05-31 NOTE — Result Encounter Note (Signed)
[name] [name]  Recent telemedicine lipid visit patient with ASCVD and statin intolerance.  From a lab perspective, she qualifies for PCSK9 inhibitor that would be administered by her niece.  Please confirm that she is agreeable to proceed and write the script and set up follow-up.  Thanks  APM

## 2021-05-31 NOTE — Telephone Encounter (Addendum)
Contacted patient.  She verbalized understanding.  Agreeable to proceed with injectable therapy.  Repatha script sent to [facility].    Lab work ordered and sent to PCP office for draw as preferred by patient.  Will send message to schedulers to have follow up set up in [place] in 3 months.      ----- Message from Hale Bogus, MD sent at 05/31/2021  8:34 AM EDT -----  [name] [name]  Recent telemedicine lipid visit patient with ASCVD and statin intolerance.  From a lab perspective, she qualifies for PCSK9 inhibitor that would be administered by her niece.  Please confirm that she is agreeable to proceed and write the script and set up follow-up.  Thanks  APM

## 2021-06-01 ENCOUNTER — Other Ambulatory Visit (INDEPENDENT_AMBULATORY_CARE_PROVIDER_SITE_OTHER): Payer: Self-pay

## 2021-06-01 ENCOUNTER — Other Ambulatory Visit: Payer: Self-pay

## 2021-06-01 NOTE — Telephone Encounter (Signed)
Prior authorization for Repatha submitted electronically on 06/01/2021 through CoverMyMeds. Key [id number]. Waiting for response from payor.    Renate L Logue  06/01/2021, 10:51

## 2021-06-09 ENCOUNTER — Other Ambulatory Visit: Payer: Self-pay

## 2021-06-09 ENCOUNTER — Other Ambulatory Visit (INDEPENDENT_AMBULATORY_CARE_PROVIDER_SITE_OTHER): Payer: Self-pay

## 2021-06-09 NOTE — Telephone Encounter (Signed)
Specialty Pharmacy Note    Prior Authorization for medication Repatha has been approved by payor [employer] from 06/08/2021 until 12/01/2021.  Approval notice has been scanned into Epic and can be found in the Media tab.    Staff message sent to Clinic noting the completion of prior authorization.  [facility] to reach out to pt to discuss copay.  If you have any questions, dont hesitate to contact the Specialty Pharmacy Renate L Logue  06/09/2021, 14:02

## 2021-06-14 ENCOUNTER — Telehealth (HOSPITAL_COMMUNITY): Payer: Self-pay | Admitting: Cardiovascular Disease

## 2021-06-14 NOTE — Telephone Encounter (Signed)
Spoke with pt yesterday re: Chiropractor for Repatha assistance.  Instructed to complete and sign form and return to [facility] Child psychotherapist. Suggested she send income documentation.  Mailed application to pt today.Jaci Standard, RN  06/14/2021, 17:26

## 2021-06-15 ENCOUNTER — Encounter (INDEPENDENT_AMBULATORY_CARE_PROVIDER_SITE_OTHER): Payer: Self-pay | Admitting: Clinical

## 2021-06-15 NOTE — Progress Notes (Signed)
Prescription assistance application for Repatha has been initiated.     Mailed patient application for completion of income information and signature.     Provider portion of application sent to Ray Church for completion/signature.     Will continue to follow.     [name] [name] was provided with information about the following community resources:  Prescription Medication assistance    Wyman Songster, LICSW  06/15/2021, 14:05

## 2021-06-26 ENCOUNTER — Encounter (INDEPENDENT_AMBULATORY_CARE_PROVIDER_SITE_OTHER): Payer: Self-pay

## 2021-06-26 DIAGNOSIS — I2510 Atherosclerotic heart disease of native coronary artery without angina pectoris: Secondary | ICD-10-CM | POA: Insufficient documentation

## 2021-06-26 DIAGNOSIS — E785 Hyperlipidemia, unspecified: Secondary | ICD-10-CM | POA: Insufficient documentation

## 2021-06-26 DIAGNOSIS — Z9861 Coronary angioplasty status: Secondary | ICD-10-CM | POA: Insufficient documentation

## 2021-06-26 DIAGNOSIS — I272 Pulmonary hypertension, unspecified: Secondary | ICD-10-CM | POA: Insufficient documentation

## 2021-06-26 DIAGNOSIS — E039 Hypothyroidism, unspecified: Secondary | ICD-10-CM | POA: Insufficient documentation

## 2021-06-26 DIAGNOSIS — E119 Type 2 diabetes mellitus without complications: Secondary | ICD-10-CM | POA: Insufficient documentation

## 2021-06-26 DIAGNOSIS — I10 Essential (primary) hypertension: Secondary | ICD-10-CM | POA: Insufficient documentation

## 2021-07-05 ENCOUNTER — Other Ambulatory Visit (INDEPENDENT_AMBULATORY_CARE_PROVIDER_SITE_OTHER): Payer: Self-pay | Admitting: Medical

## 2021-07-24 ENCOUNTER — Ambulatory Visit (INDEPENDENT_AMBULATORY_CARE_PROVIDER_SITE_OTHER): Payer: Self-pay | Admitting: INTERNAL MEDICINE CARDIOVASCULAR DISEASE

## 2021-07-24 ENCOUNTER — Telehealth (HOSPITAL_COMMUNITY): Payer: Self-pay | Admitting: Cardiovascular Disease

## 2021-07-24 NOTE — Telephone Encounter (Signed)
Follow-up call to pt re: Chiropractor for Repatha assistance  7.13.22 Spoke with pt and mailed application.  8.22.22 Pt states she mailed application back late last week.Jaci Standard, RN  07/24/2021, 12:29

## 2021-07-24 NOTE — Telephone Encounter (Addendum)
-----   Message from Janann August, Ambulatory Care Assistant sent at 07/24/2021  8:26 AM EDT -----  Chest pain. Sob last week.   3 nitro's and baby aspirin before she felt a little better.       Ashok Norris, PA-C  [place], [place], RN  She'll need a close follow up to reassess and make medicine changes.   If this happens again, she needs to go to the ER.     Ashok Norris, PA-C 07/24/2021, 12:58   [place] Department of Medicine, Section of Cardiology       Called and spoke with patient, follow up visit moved to 07/26/21 at 2:30PM. Advised patient to go to ED if she had recurred episode.  [facility], RN  07/24/2021, 15:05

## 2021-07-26 ENCOUNTER — Other Ambulatory Visit: Payer: Self-pay

## 2021-07-26 ENCOUNTER — Ambulatory Visit (INDEPENDENT_AMBULATORY_CARE_PROVIDER_SITE_OTHER): Payer: Medicare PPO | Admitting: Medical

## 2021-07-26 ENCOUNTER — Other Ambulatory Visit (INDEPENDENT_AMBULATORY_CARE_PROVIDER_SITE_OTHER): Payer: Self-pay | Admitting: NURSE PRACTITIONER, FAMILY

## 2021-07-26 ENCOUNTER — Encounter (INDEPENDENT_AMBULATORY_CARE_PROVIDER_SITE_OTHER): Payer: Self-pay | Admitting: Medical

## 2021-07-26 VITALS — BP 128/71 | HR 88 | Ht 62.5 in | Wt 171.1 lb

## 2021-07-26 DIAGNOSIS — I10 Essential (primary) hypertension: Secondary | ICD-10-CM

## 2021-07-26 DIAGNOSIS — I200 Unstable angina: Secondary | ICD-10-CM

## 2021-07-26 DIAGNOSIS — Z95820 Peripheral vascular angioplasty status with implants and grafts: Secondary | ICD-10-CM

## 2021-07-26 DIAGNOSIS — I272 Pulmonary hypertension, unspecified: Secondary | ICD-10-CM

## 2021-07-26 DIAGNOSIS — I2510 Atherosclerotic heart disease of native coronary artery without angina pectoris: Secondary | ICD-10-CM

## 2021-07-26 DIAGNOSIS — E785 Hyperlipidemia, unspecified: Secondary | ICD-10-CM

## 2021-07-26 MED ORDER — AMLODIPINE 2.5 MG TABLET
2.50 mg | ORAL_TABLET | Freq: Every day | ORAL | 1 refills | Status: DC
Start: 2021-07-26 — End: 2021-08-09

## 2021-07-26 NOTE — Progress Notes (Signed)
CARDIOLOGY [facility] HEART & VASCULAR [place], [facility]  [address]  [place] [address]  Phone: [phone]  Fax: [phone]    Encounter Date: 07/26/2021    Patient ID:  [name] [name]  [id number]    DOB:   Age: 80 y.o. female    Subjective:     Chief Complaint   Patient presents with   . Heart Disease   . Hypertension   . Hyperlipidemia   . Chest Pain     Last episode Mon Aug 22.     HPI   [name] [name] is a pleasant 80 y.o. female presenting for follow up. She has known CAD status post PCI to the left circumflexx 2 with most recent stent placed in 2018. She also has a history of diabetes mellitus, hypertension and hyperlipidemia. Due to chest pain, she underwent a Lexiscan MPS in October of 2020 that showed no evidence of ischemia or infarct. A TTE in October of 2020 showed EF 60-65% with diastolic dysfunction and RVSP 56-25 mmHg consistent with severe pulmonary hypertension. She most recently underwent cardiac catheterization in April 2021 that showed patent stents with nonobstructive CAD in the LAD and pulmonary artery pressure of 36 consistent with pulmonary hypertension likely secondary to cardiac causes pressure improved with sublingual Nitroglycerin. She wore a 48 hour Holter monitor in July of 2021 that showed symptoms correlating with sinus rhythm and no significant arrhythmias. She called our office for an earlier appointment due to chest pain and dyspnea. Today,she reports that she has been having intermittent chest pain with associated dyspnea, nausea and vomiting that wakes her up from sleep for the last month. She initially had been taking 1 nitroglycerin with relief, but her symptoms have rapidly progressed. She notes that she had chest pain 3 days in a row last week and had to take 3 nitroglycerin and 1 baby aspirin before her symptoms resolved 1 week ago. Her last episode of chest pain was 2 days ago and resolved after 2 nitroglycerin. She also has limited  exercise tolerance due to dyspnea on exertion. She denies palpitations, bleeding complications, pre-syncope and syncope. No TIA or stroke-like symptoms.       Current Outpatient Medications   Medication Sig   . ALPRAZolam (XANAX) 0.5 mg Oral Tablet Take 0.5 mg by mouth Every evening   . amLODIPine (NORVASC) 2.5 mg Oral Tablet Take 1 Tablet (2.5 mg total) by mouth Once a day   . Cholecalciferol, Vitamin D3, 10 mcg (400 unit) Oral Tablet, Chewable Chew Once a day   . cloNIDine HCL (CATAPRES) 0.1 mg Oral Tablet TAKE 1 TABLET BY MOUTH THREE TIMES A DAY AS NEEDED FOR SYSTOLIC BP GREATER THAN OR EQUAL TO 150   . clopidogreL (PLAVIX) 75 mg Oral Tablet Take 75 mg by mouth Once a day   . diphenhydrAMINE (BENADRYL) 50 mg Oral Capsule Take 1 capsule by mouth 1 hour prior to Cath   . evolocumab (REPATHA SURECLICK) 140 mg/mL Subcutaneous Pen Injector Inject 19mL under the skin every 14 days.   . fluticasone propionate (FLONASE NASL) by Nasal route   . Irbesartan-Hydrochlorothiazide 300-12.5 mg Oral Tablet Take 300 mg by mouth Once a day   . Isosorbide Mononitrate (IMDUR) 120 mg Oral Tablet Sustained Release 24 hr Take 1 Tablet (120 mg total) by mouth Every morning   . levothyroxine (SYNTHROID) 100 mcg Oral Tablet Take 100 mcg by mouth Every morning   . metFORMIN (GLUCOPHAGE) 500 mg Oral Tablet Take 500 mg by  mouth Once a day (Patient not taking: Reported on 07/26/2021)   . metoprolol tartrate (LOPRESSOR) 50 mg Oral Tablet Take 1 Tablet (50 mg total) by mouth Twice daily   . nitroGLYCERIN (NITROSTAT) 0.4 mg Sublingual Tablet, Sublingual PLACE 1 TABLET UNDER TONGUE EVERY 5 MINS, UP TO 3 DOSES AS NEEDED FOR CHEST PAIN   . pantoprazole (PROTONIX) 40 mg Oral Tablet, Delayed Release (E.C.) Take 40 mg by mouth Once a day   . POTASSIUM-99 ORAL Take by mouth Once a day   . predniSONE (DELTASONE) 50 mg Oral Tablet Take 1 tablet by mouth 13 hours prior to Cath, 7 hours prior to Cath, and 1 hour prior to Cath.   . TRADJENTA 5 mg Oral Tablet  Take 5 mg by mouth Once a day   . valACYclovir (VALTREX) 500 mg Oral Tablet Take 500 mg by mouth Twice daily   . vitamin B complex (B COMPLEX-VITAMIN B12 ORAL) Take by mouth Once a day     Allergies   Allergen Reactions   . Pneumococcal 23-Valent Polysaccharide Vaccine  Other Adverse Reaction (Add comment)     Severe local swelling   . Tramadol Nausea/ Vomiting     "makes me sick with or without food"   . Iohexol Hives/ Urticaria and  Other Adverse Reaction (Add comment)      Desc: HIVES SEVERAL HRS S/P IV CONTRAST.. [place] [facility]   Marland Kitchen. Lisinopril      Other reaction(s): Cough   . Other Rash     Other reaction(s): Other (See Comments)  Nickle   When teeth were wired in she place, patient obtained an infection and wiring had to be removed,  Also allergic to silver   . Crestor [Rosuvastatin]      Muscle aches   . Lovastatin    . Statin [Atorvastatin]      Muscle ache   . Iv Contrast Hives/ Urticaria   . Nickel Hives/ Urticaria     Past Medical History:   Diagnosis Date   . Coronary artery disease    . Essential hypertension    . Hyperlipidemia          Past Surgical History:   Procedure Laterality Date   . HX HEART CATHETERIZATION  2017 and 2018    Stented twice  prox circ  and circ   . HX THYROIDECTOMY  1978   . HX WISDOM TEETH EXTRACTION     . KNEE SURGERY  2018    muscle removed   . LAPAROSCOPIC CHOLECYSTECTOMY     . RECONSTRUCTION OF NOSE  1980         Family Medical History:     Problem Relation (Age of Onset)    Cardiomyopathy Father    Heart Attack Mother    Heart Disease Sister          Social History     Tobacco Use   . Smoking status: Former Smoker     Packs/day: 0.50     Years: 10.00     Pack years: 5.00   . Smokeless tobacco: Never Used   Vaping Use   . Vaping Use: Never used   Substance Use Topics   . Alcohol use: Not Currently     Alcohol/week: 1.0 standard drink     Types: 1 Cans of beer per week     Comment: drank long time ago not current.   . Drug use: Never  Review  of Systems   All other systems reviewed and are negative.    Objective:   Vitals: BP 128/71   Pulse 88   Ht 1.588 m (5' 2.5")   Wt 77.6 kg (171 lb 1.6 oz)   SpO2 95%   BMI 30.80 kg/m         Physical Exam  Vitals and nursing note reviewed.   Constitutional:       General: She is not in acute distress.     Appearance: She is not ill-appearing or diaphoretic.   HENT:      Head: Normocephalic and atraumatic.   Cardiovascular:      Rate and Rhythm: Normal rate and regular rhythm.      Heart sounds: No murmur heard.  Pulmonary:      Effort: Pulmonary effort is normal. No respiratory distress.      Breath sounds: Normal breath sounds. No wheezing or rales.   Musculoskeletal:      Cervical back: Normal range of motion and neck supple.      Right lower leg: Edema (trace) present.      Left lower leg: Edema (trace) present.   Skin:     General: Skin is warm and dry.   Neurological:      Mental Status: She is alert and oriented to person, place, and time.   Psychiatric:         Behavior: Behavior normal.         Assessment & Plan:     ENCOUNTER DIAGNOSES     ICD-10-CM   1. Unstable angina (CMS HCC)  I20.0   2. CAD in native artery  I25.10   3. S/P angioplasty with stent  Z95.820   4. HTN (hypertension)  I10   5. HLD (hyperlipidemia)  E78.5   6. Pulmonary HTN (CMS HCC)  I27.20       In summary, Ms. Dugo is a pleasant 80 year old lady with known CAD status post PCI to the left circumflexx 2 with most recent stent placed in 2018. She also has a history of diabetes mellitus, hypertension and hyperlipidemia. She most recently underwent cardiac catheterization in April 2021 that showed patent stents with nonobstructive CAD in the LAD and pulmonary artery pressure of 36 consistent with pulmonary hypertension likely secondary to cardiac causes pressure improved with sublingual Nitroglycerin. She called our office for an earlier appointment due to chest pain and dyspnea. Today,she reports that she has been having intermittent  chest pain with associated dyspnea, nausea and vomiting that wakes her up from sleep for the last month. She initially had been taking 1 nitroglycerin with relief, but her symptoms have rapidly progressed. She notes that she had chest pain 3 days in a row last week and had to take 3 nitroglycerin and 1 baby aspirin before her symptoms resolved 1 week ago. Her last episode of chest pain was 2 days ago and resolved after 2 nitroglycerin. She also has limited exercise tolerance due to dyspnea on exertion. She had negative stress tests in the past before both of her stents were placed. Her symptoms are very concerning for unstable angina, and she understandably is interested in pursuing cardiac catheterization via radial access. Will also add Norvasc 2.5 mg once daily for anti-anginal therapy. She agrees to call our office with any change in her symptoms or status.       Orders Placed This Encounter   . amLODIPine (NORVASC) 2.5 mg Oral Tablet  Return in about 6 weeks (around 09/06/2021), or if symptoms worsen or fail to improve, for follow up after cardiac catheterization.    I independently of the faculty provider spent a total of 40 minutes in direct/indirect care of this patient including initial evaluation, review of laboratory, radiology, diagnostic studies, review of medical record, order entry and coordination of care.    This patient was seen independently with supervising physician not present, but available for consultation.    Kessie Croston O'Kernick, PA-C

## 2021-07-26 NOTE — Patient Instructions (Addendum)
Start amlodipine (Norvasc) 2.5 mg once daily.   Please call us with any change in how you are feeling and report to the emergency department with any concerns for a heart attack.

## 2021-07-26 NOTE — Nursing Note (Signed)
Patient does not have medication list at time of visit. Advised to bring list and/or medication bottles to next visit. Medication list reviewed in epic.

## 2021-07-26 NOTE — Nursing Note (Signed)
Contacted patient and provided the below instructions.  Patient ordered cardiac cath. Dr. Karie Mainland to be the physician to perform the procedure. Patient scheduled for 08/09/21 at [facility] instructed to report to registration at 7AM.  Patient will be NPO at midnight.   Patient will have someone to drive them home. Every effort will be made to discharge the patient home same day, however you may need to stay overnight. Suggest packing a small bag of essentials just in case.  Reviewed medication list. Patient is to hold oral diabetic medication day of procedure.    The patient may take all other meds with a sip of water, including aspirin and plavix.  Patient does not have contrast dye allergy.  Pre-Procedure/Op needs- Labs BMP, CBC diff and PT/INR (within 7 days of procedure). Recent labs in results review.  Understanding was verbalized of the given information. All questions were answered. Patient was instructed to contact our office if they had any additional questions or concerns about the above scheduled procedure.  [facility], RN  07/26/2021, 15:37

## 2021-07-28 ENCOUNTER — Other Ambulatory Visit (INDEPENDENT_AMBULATORY_CARE_PROVIDER_SITE_OTHER): Payer: Self-pay | Admitting: Medical

## 2021-08-01 ENCOUNTER — Encounter (HOSPITAL_COMMUNITY): Payer: Self-pay | Admitting: Social Worker

## 2021-08-01 NOTE — Progress Notes (Signed)
MSW received a letter from [employer] stating that pt is approved for financial assistance through 12/02/21. MSW called pt and she stated that [employer] has called her and her medication will arrive 08/04/21. Will follow as necessary.      Genasis Zingale was provided with information about the following community resources:  [employer] Medication assistance        Micheline Maze, MSW, LGSW  [employer]

## 2021-08-03 ENCOUNTER — Encounter (HOSPITAL_COMMUNITY): Payer: Self-pay | Admitting: INTERNAL MEDICINE CARDIOVASCULAR DISEASE

## 2021-08-03 NOTE — Nursing Note (Signed)
COVID-19 Admission Screen    Low Risk:   Able to Provide asymptomatic History  No recent Travel/ Following Stay at Home  No Sick Contacts  No New Household Conatcts  None    Moderate/High Risk: {None    Test Result:Not Indicated

## 2021-08-04 ENCOUNTER — Other Ambulatory Visit (INDEPENDENT_AMBULATORY_CARE_PROVIDER_SITE_OTHER): Payer: Self-pay | Admitting: Medical

## 2021-08-04 MED ORDER — DIPHENHYDRAMINE 50 MG CAPSULE
ORAL_CAPSULE | ORAL | 0 refills | Status: DC
Start: 2021-08-04 — End: 2021-09-06

## 2021-08-04 MED ORDER — PREDNISONE 50 MG TABLET
ORAL_TABLET | ORAL | 0 refills | Status: DC
Start: 2021-08-04 — End: 2021-09-06

## 2021-08-09 ENCOUNTER — Inpatient Hospital Stay
Admission: RE | Admit: 2021-08-09 | Discharge: 2021-08-09 | Disposition: A | Discharge status: Home / Self Care | Payer: Medicare PPO | Source: Ambulatory Visit | Attending: INTERNAL MEDICINE CARDIOVASCULAR DISEASE | Admitting: INTERNAL MEDICINE CARDIOVASCULAR DISEASE

## 2021-08-09 ENCOUNTER — Encounter (HOSPITAL_COMMUNITY): Payer: Medicare PPO | Admitting: INTERNAL MEDICINE CARDIOVASCULAR DISEASE

## 2021-08-09 ENCOUNTER — Encounter (HOSPITAL_COMMUNITY)
Admission: RE | Disposition: A | Discharge status: Home / Self Care | Payer: Self-pay | Source: Ambulatory Visit | Attending: INTERNAL MEDICINE CARDIOVASCULAR DISEASE

## 2021-08-09 ENCOUNTER — Encounter (HOSPITAL_COMMUNITY): Payer: Self-pay | Admitting: INTERNAL MEDICINE CARDIOVASCULAR DISEASE

## 2021-08-09 ENCOUNTER — Other Ambulatory Visit: Payer: Self-pay

## 2021-08-09 DIAGNOSIS — I200 Unstable angina: Secondary | ICD-10-CM

## 2021-08-09 DIAGNOSIS — Z955 Presence of coronary angioplasty implant and graft: Secondary | ICD-10-CM | POA: Insufficient documentation

## 2021-08-09 DIAGNOSIS — I25110 Atherosclerotic heart disease of native coronary artery with unstable angina pectoris: Secondary | ICD-10-CM | POA: Insufficient documentation

## 2021-08-09 DIAGNOSIS — I272 Pulmonary hypertension, unspecified: Secondary | ICD-10-CM | POA: Insufficient documentation

## 2021-08-09 HISTORY — DX: Dyspnea, unspecified: R06.00

## 2021-08-09 HISTORY — DX: Other forms of dyspnea: R06.09

## 2021-08-09 HISTORY — DX: Other specified postprocedural states: Z98.890

## 2021-08-09 HISTORY — DX: Presence of coronary angioplasty implant and graft: Z95.5

## 2021-08-09 HISTORY — DX: Nausea with vomiting, unspecified: R11.2

## 2021-08-09 HISTORY — DX: Type 2 diabetes mellitus without complications: E11.9

## 2021-08-09 SURGERY — CORONARY ANGIOGRAPHY W/LEFT HEART CATH W/WO LVG
Anesthesia: IV Sedation (Nurse Monitored) | Site: Wrist

## 2021-08-09 MED ORDER — NITROGLYCERIN 1 MG IN 20 ML NS (50 MCG/ML) INJECTION
INJECTION | Freq: Once | INTRAVENOUS | Status: DC | PRN
Start: 2021-08-09 — End: 2021-08-09
  Administered 2021-08-09: 200 ug via INTRA_ARTERIAL

## 2021-08-09 MED ORDER — FENTANYL (PF) 50 MCG/ML INJECTION SOLUTION
INTRAMUSCULAR | Status: AC
Start: 2021-08-09 — End: 2021-08-09
  Filled 2021-08-09: qty 2

## 2021-08-09 MED ORDER — LIDOCAINE HCL 20 MG/ML (2 %) INJECTION SOLUTION
Freq: Once | INTRAMUSCULAR | Status: DC | PRN
Start: 2021-08-09 — End: 2021-08-09
  Administered 2021-08-09: 09:00:00 1 mL via INTRADERMAL
  Administered 2021-08-09: 2 mL via INTRADERMAL
  Administered 2021-08-09: 1 mL via INTRADERMAL

## 2021-08-09 MED ORDER — MIDAZOLAM 1 MG/ML INJECTION WRAPPER
INTRAMUSCULAR | Status: AC
Start: 2021-08-09 — End: 2021-08-09
  Filled 2021-08-09: qty 2

## 2021-08-09 MED ORDER — IODIXANOL 320 MG IODINE/ML INTRAVENOUS SOLUTION
Freq: Once | INTRAVENOUS | Status: DC | PRN
Start: 2021-08-09 — End: 2021-08-09
  Administered 2021-08-09: 65 mL via INTRAMUSCULAR

## 2021-08-09 MED ORDER — HEPARIN (PORCINE) (PF) 2,000 UNIT/1,000 ML IN 0.9 % SODIUM CHLORIDE IV
Freq: Once | INTRAVENOUS | Status: DC | PRN
Start: 2021-08-09 — End: 2021-08-09
  Administered 2021-08-09 (×2): 1000 mL

## 2021-08-09 MED ORDER — VERAPAMIL 2.5 MG/ML INTRAVENOUS SOLUTION
INTRAVENOUS | Status: AC
Start: 2021-08-09 — End: 2021-08-09
  Filled 2021-08-09: qty 2

## 2021-08-09 MED ORDER — AMLODIPINE 2.5 MG TABLET
10.0000 mg | ORAL_TABLET | Freq: Every day | ORAL | 1 refills | Status: DC
Start: 2021-08-09 — End: 2021-09-06

## 2021-08-09 MED ORDER — RANOLAZINE ER 500 MG TABLET,EXTENDED RELEASE,12 HR
500.0000 mg | ORAL_TABLET | Freq: Two times a day (BID) | ORAL | 3 refills | Status: AC
Start: 2021-08-09 — End: 2021-11-15

## 2021-08-09 MED ORDER — MIDAZOLAM 1 MG/ML INJECTION WRAPPER
Freq: Once | INTRAMUSCULAR | Status: DC | PRN
Start: 2021-08-09 — End: 2021-08-09
  Administered 2021-08-09 (×2): 1 mg via INTRAVENOUS

## 2021-08-09 MED ORDER — HEPARIN (PORCINE) 1,000 UNIT/ML INJECTION SOLUTION
Freq: Once | INTRAMUSCULAR | Status: DC | PRN
Start: 2021-08-09 — End: 2021-08-09
  Administered 2021-08-09: 4000 [IU] via INTRAVENOUS

## 2021-08-09 MED ORDER — FENTANYL (PF) 50 MCG/ML INJECTION SOLUTION
Freq: Once | INTRAMUSCULAR | Status: DC | PRN
Start: 2021-08-09 — End: 2021-08-09
  Administered 2021-08-09: 25 ug via INTRAVENOUS

## 2021-08-09 MED ORDER — VERAPAMIL 2.5 MG/ML INTRAVENOUS SOLUTION
Freq: Once | INTRAVENOUS | Status: DC | PRN
Start: 2021-08-09 — End: 2021-08-09
  Administered 2021-08-09: 2.5 mg via INTRA_ARTERIAL

## 2021-08-09 MED ORDER — HEPARIN (PORCINE) 1,000 UNIT/ML INJECTION SOLUTION
INTRAMUSCULAR | Status: AC
Start: 2021-08-09 — End: 2021-08-09
  Filled 2021-08-09: qty 10

## 2021-08-09 MED ORDER — SODIUM CHLORIDE 0.9 % INTRAVENOUS SOLUTION
INTRAVENOUS | Status: AC | PRN
Start: 2021-08-09 — End: 2021-08-09
  Administered 2021-08-09: 25 mL/h via INTRAVENOUS

## 2021-08-09 SURGICAL SUPPLY — 17 items
APPL 70% ISPRP 2% CHG 26ML CHLRPRP HI-LT ORNG PREP STRL LF  DISP CLR (MED SURG SUPPLIES) ×2 IMPLANT
ARMBRD IV ADULT 9X3.5IN PLASTIC FOAM HNDAD WRST REPL PAD STRAP START BLOCK HKLP CLSR DISP LF  14.5IN (IV TUBING & ACCESSORIES) ×1 IMPLANT
CATH ANGIO 6FR RADIAL TIG 4 CURVE 110CM OPTITORQUE LRG LUM SH 2 BRD SFT TIP COR SS NYL POLYUR (VASCULAR) ×1 IMPLANT
CONV USE ITEM 338637 - PACK SURG UNIV CV STRL DISP LF (CUSTOM TRAYS & PACK) ×1
CUSHION FOAM POSITION RAD REST ARM ELBOW RAD ERG SUP ADJ LF  DISP (MED SURG SUPPLIES) ×1 IMPLANT
DEVICE COMPRESS TR BAND 24CM 2 BAL TRNSPR ADJ STRAP REG RADIAL ART (MED SURG SUPPLIES) ×1 IMPLANT
DUPE USE ITEM 319416 - ELECTRODE DEFIBR ADLT CNDCT GE_L RTS EDGE SYS QCMB DISP (MED SURG SUPPLIES) ×1 IMPLANT
GW .035IN 260CM ANGIO PTFE FIX COR STD BODY STN FNSH 2 END 3MM J CURVE STRL (VASCULAR) ×1 IMPLANT
GW GLDWR .035IN 3CM 180CM FLXB ATRAUMA TIP RADOPQ KINK RST NITINOL TUNG POLYUR HDRPH VAS 1.5MM J (VASCULAR) ×1 IMPLANT
KIT ANGIO NAMIC MTS CTH LB STRL LF  DISP UN HOSPITAL CNTR (VASCULAR) ×1 IMPLANT
KIT INTROD 10CM 6FR 22GA GLIDESHEATH SLNDR .021IN PLASTIC SHEATH DIL 2 WL PNCT SHORT ANG MINIWIRE 45 (VASCULAR) ×1 IMPLANT
KIT INTROD 10CM 6FR 22GA GLIDE_SHEATH SLNDR .021IN PLASTIC (VASCULAR) ×1
KIT SURG NITRO STRL DISP MTS UN HOSPITAL LF (IV TUBING & ACCESSORIES) ×1 IMPLANT
PACK UNIVERSAL CARDIOVASCULAR  - ~~LOC~~ (CUSTOM TRAYS & PACK) ×1 IMPLANT
SENSOR ADULT NLCR LOSAT 18IN C30+ KG OXIMETER SPO2 ADH TEAR RST BANDAGE PVC STRL LF  DISP (MED SURG SUPPLIES) ×1 IMPLANT
TUBING ANGIO 72IN MALE TO MALE LL 1 WY CK VALVE CHAMBER 12ML SQUEEZE CNRST CONTROLLR STRL (VASCULAR) ×1 IMPLANT
VISIPAQUE 150ML V564 PK/10 (CONTRAST) ×1 IMPLANT

## 2021-08-09 NOTE — Discharge Instructions (Signed)
Post operative care instructions given to patient. Some hand outs may not be patient specific. Patients should always follow the physicians specific instructions, which should be found on the front of the AVS, and/or given/ discussed in person. Procedure hand outs are for reference use only. Medication information given as appropriate to situation.     Hospital Operator [phone]

## 2021-08-09 NOTE — OR PreOp (Signed)
COVID-19 Admission Screen    Low Risk:  None    Moderate/High Risk: {None    Test Result:Not Indicated

## 2021-08-09 NOTE — H&P (Signed)
[facility] The  [facility] MEDICINE [facility]  [address]  [facility] [address]  Dept: [phone]  Dept Fax: [phone]  Loc: [phone]    80 y.o. female presenting for follow up. She has known CAD status post PCI to the left circumflexx 2 with most recent stent placed in 2018. She also has a history of diabetes mellitus, hypertension and hyperlipidemia. Due to chest pain, she underwent a Lexiscan MPS in October of 2020 that showed no evidence of ischemia or infarct. A TTE in October of 2020 showed EF 60-65% with diastolic dysfunction and RVSP 62-26 mmHg consistent with severe pulmonary hypertension.She most recently underwentcardiac catheterization in April 2021 that showed patent stents with nonobstructive CAD in the LAD and pulmonary artery pressure of 36 consistent with pulmonary hypertension likely secondary to cardiac causes pressure improved with sublingualNitroglycerin. She wore a 48 hour Holter monitor in July of 2021 that showed symptoms correlating with sinus rhythm and no significant arrhythmias.She called our office for an earlier appointment due to chest pain and dyspnea. Today,she reports that she has been having intermittent chest pain with associated dyspnea, nausea and vomiting that wakes her up from sleep for the last month. She initially had been taking 1 nitroglycerin with relief, but her symptoms have rapidly progressed. She notes that she had chest pain 3 days in a row last week and had to take 3 nitroglycerin and 1 baby aspirin before her symptoms resolved 1 week ago. Her last episode of chest pain was 2 days ago and resolved after 2 nitroglycerin. She also has limited exercise tolerance due to dyspnea on exertion. She denies palpitations, bleeding complications, pre-syncope and syncope. No TIA or stroke-like symptoms.            Current Outpatient Medications   Medication Sig   . ALPRAZolam (XANAX) 0.5 mg Oral Tablet Take 0.5 mg by mouth Every  evening   . amLODIPine (NORVASC) 2.5 mg Oral Tablet Take 1 Tablet (2.5 mg total) by mouth Once a day   . Cholecalciferol, Vitamin D3, 10 mcg (400 unit) Oral Tablet, Chewable Chew Once a day   . cloNIDine HCL (CATAPRES) 0.1 mg Oral Tablet TAKE 1 TABLET BY MOUTH THREE TIMES A DAY AS NEEDED FOR SYSTOLIC BP GREATER THAN OR EQUAL TO 150   . clopidogreL (PLAVIX) 75 mg Oral Tablet Take 75 mg by mouth Once a day   . diphenhydrAMINE (BENADRYL) 50 mg Oral Capsule Take 1 capsule by mouth 1 hour prior to Cath   . evolocumab (REPATHA SURECLICK) 140 mg/mL Subcutaneous Pen Injector Inject 39mL under the skin every 14 days.   . fluticasone propionate (FLONASE NASL) by Nasal route   . Irbesartan-Hydrochlorothiazide 300-12.5 mg Oral Tablet Take 300 mg by mouth Once a day   . Isosorbide Mononitrate (IMDUR) 120 mg Oral Tablet Sustained Release 24 hr Take 1 Tablet (120 mg total) by mouth Every morning   . levothyroxine (SYNTHROID) 100 mcg Oral Tablet Take 100 mcg by mouth Every morning   . metFORMIN (GLUCOPHAGE) 500 mg Oral Tablet Take 500 mg by mouth Once a day (Patient not taking: Reported on 07/26/2021)   . metoprolol tartrate (LOPRESSOR) 50 mg Oral Tablet Take 1 Tablet (50 mg total) by mouth Twice daily   . nitroGLYCERIN (NITROSTAT) 0.4 mg Sublingual Tablet, Sublingual PLACE 1 TABLET UNDER TONGUE EVERY 5 MINS, UP TO 3 DOSES AS NEEDED FOR CHEST PAIN   . pantoprazole (PROTONIX) 40 mg Oral Tablet, Delayed Release (E.C.) Take 40 mg  by mouth Once a day   . POTASSIUM-99 ORAL Take by mouth Once a day   . predniSONE (DELTASONE) 50 mg Oral Tablet Take 1 tablet by mouth 13 hours prior to Cath, 7 hours prior to Cath, and 1 hour prior to Cath.   . TRADJENTA 5 mg Oral Tablet Take 5 mg by mouth Once a day   . valACYclovir (VALTREX) 500 mg Oral Tablet Take 500 mg by mouth Twice daily   . vitamin B complex (B COMPLEX-VITAMIN B12 ORAL) Take by mouth Once a day     Allergies   Allergen Reactions   . Pneumococcal 23-Valent Polysaccharide Vaccine  Other  Adverse Reaction (Add comment)     Severe local swelling   . Tramadol Nausea/ Vomiting     "makes me sick with or without food"   . Iohexol Hives/ Urticaria and  Other Adverse Reaction (Add comment)      Desc: HIVES SEVERAL HRS S/P IV CONTRAST.. [place] LAST SCAN W/ BENADRYL PREMED @ [facility] '01/AC.   Marland Kitchen Lisinopril      Other reaction(s): Cough   . Other Rash     Other reaction(s): Other (See Comments)  Nickle   When teeth were wired in she place, patient obtained an infection and wiring had to be removed,  Also allergic to silver   . Crestor [Rosuvastatin]      Muscle aches   . Lovastatin    . Statin [Atorvastatin]      Muscle ache   . Iv Contrast Hives/ Urticaria   . Nickel Hives/ Urticaria          Past Medical History:   Diagnosis Date   . Coronary artery disease    . Essential hypertension    . Hyperlipidemia                Past Surgical History:   Procedure Laterality Date   . HX HEART CATHETERIZATION  2017 and 2018    Stented twice  prox circ  and circ   . HX THYROIDECTOMY  1978   . HX WISDOM TEETH EXTRACTION     . KNEE SURGERY  2018    muscle removed   . LAPAROSCOPIC CHOLECYSTECTOMY     . RECONSTRUCTION OF NOSE  1980              Family Medical History:        Problem Relation (Age of Onset)    Cardiomyopathy Father    Heart Attack Mother    Heart Disease Sister             Social History           Tobacco Use   . Smoking status: Former Smoker     Packs/day: 0.50     Years: 10.00     Pack years: 5.00   . Smokeless tobacco: Never Used   Vaping Use   . Vaping Use: Never used   Substance Use Topics   . Alcohol use: Not Currently     Alcohol/week: 1.0 standard drink     Types: 1 Cans of beer per week     Comment: drank long time ago not current.   . Drug use: Never       Review of Systems   All other systems reviewed and are negative.    Objective:   Vitals: BP 128/71   Pulse 88   Ht 1.588 m (5' 2.5")   Wt 77.6 kg (171 lb 1.6 oz)  SpO2 95%   BMI 30.80 kg/m        Patient Vitals for the past 24 hrs:   BP Temp Pulse Resp SpO2 Height Weight   08/09/21 0829 (!) 175/66 -- -- -- -- -- --   08/09/21 0801 (!) 218/93 36.6 C (97.9 F) 72 16 97 % 1.575 m (5\' 2" ) 77.3 kg (170 lb 6.7 oz)         Physical Exam  Vitals and nursing note reviewed.   Constitutional:       General: She is not in acute distress.     Appearance: She is not ill-appearing or diaphoretic.   HENT:      Head: Normocephalic and atraumatic.   Cardiovascular:      Rate and Rhythm: Normal rate and regular rhythm.      Heart sounds: No murmur heard.  Pulmonary:      Effort: Pulmonary effort is normal. No respiratory distress.      Breath sounds: Normal breath sounds. No wheezing or rales.   Musculoskeletal:      Cervical back: Normal range of motion and neck supple.      Right lower leg: Edema (trace) present.      Left lower leg: Edema (trace) present.   Skin:     General: Skin is warm and dry.   Neurological:      Mental Status: She is alert and oriented to person, place, and time.   Psychiatric:         Behavior: Behavior normal.         Assessment & Plan:          ENCOUNTER DIAGNOSES     ICD-10-CM   1. Unstable angina (CMS HCC)  I20.0   2. CAD in native artery  I25.10   3. S/P angioplasty with stent  Z95.820   4. HTN (hypertension)  I10   5. HLD (hyperlipidemia)  E78.5   6. Pulmonary HTN (CMS HCC)  I27.20       In summary, Ms. Rosasco is a pleasant 80 year old lady with known CAD status post PCI to the left circumflexx 2 with most recent stent placed in 2018. She also has a history of diabetes mellitus, hypertension and hyperlipidemia. She most recently underwentcardiac catheterization in April 2021 that showed patent stents with nonobstructive CAD in the LAD and pulmonary artery pressure of 36 consistent with pulmonary hypertension likely secondary to cardiac causes pressure improved with sublingualNitroglycerin. She called our office for an earlier appointment due to chest pain and dyspnea.  Today,she reports that she has been having intermittent chest pain with associated dyspnea, nausea and vomiting that wakes her up from sleep for the last month. She initially had been taking 1 nitroglycerin with relief, but her symptoms have rapidly progressed. She notes that she had chest pain 3 days in a row last week and had to take 3 nitroglycerin and 1 baby aspirin before her symptoms resolved 1 week ago. Her last episode of chest pain was 2 days ago and resolved after 2 nitroglycerin. She also has limited exercise tolerance due to dyspnea on exertion. She had negative stress tests in the past before both of her stents were placed. Her symptoms are very concerning for unstable angina, and she understandably is interested in pursuing cardiac catheterization via radial access.  She has ongoing symptoms after addition of amlodipine verbalized understanding of risks and benefits and agreed to proceed

## 2021-09-06 ENCOUNTER — Encounter (INDEPENDENT_AMBULATORY_CARE_PROVIDER_SITE_OTHER): Payer: Self-pay | Admitting: Physician Assistant

## 2021-09-06 ENCOUNTER — Ambulatory Visit (INDEPENDENT_AMBULATORY_CARE_PROVIDER_SITE_OTHER): Payer: Medicare PPO | Admitting: Physician Assistant

## 2021-09-06 ENCOUNTER — Other Ambulatory Visit: Payer: Self-pay

## 2021-09-06 VITALS — BP 150/70 | HR 67 | Ht 62.0 in | Wt 167.0 lb

## 2021-09-06 DIAGNOSIS — E785 Hyperlipidemia, unspecified: Secondary | ICD-10-CM

## 2021-09-06 DIAGNOSIS — Z95820 Peripheral vascular angioplasty status with implants and grafts: Secondary | ICD-10-CM

## 2021-09-06 DIAGNOSIS — I10 Essential (primary) hypertension: Secondary | ICD-10-CM

## 2021-09-06 DIAGNOSIS — E119 Type 2 diabetes mellitus without complications: Secondary | ICD-10-CM

## 2021-09-06 DIAGNOSIS — I2510 Atherosclerotic heart disease of native coronary artery without angina pectoris: Secondary | ICD-10-CM

## 2021-09-06 MED ORDER — AMLODIPINE 2.5 MG TABLET
2.5000 mg | ORAL_TABLET | Freq: Every day | ORAL | 1 refills | Status: DC
Start: 2021-09-06 — End: 2022-01-12

## 2021-09-06 NOTE — Nursing Note (Signed)
Pt did not have a med list with them at visit - went over list with patient   Advised patient to check AVS list with medicines at home and call if any discrepancies    -Confirmed correct pharmacy with patient for e-scribing   [facility], RN  09/06/2021, 10:50

## 2021-09-06 NOTE — Progress Notes (Signed)
[place] [place]  [address]  [place] [address]  Phone: [phone]  Fax: [phone]    Encounter Date: 09/06/2021    Patient ID:  [name] [name]  NTZ:[id number]    DOB:   Age: 80 y.o. female    Subjective:     Chief Complaint   Patient presents with   . Follow Up     cath   . No Complaints     HPI   [name] [name] is a pleasant 80 y.o. female with a PMH significant for CAD, hypertension, hyperlipidemia and diabetes mellitus presenting for follow up from cardiac catheterization. She has known CAD status post PCI to the left circumflexx 2 with most recent stent placement in 2018. Due to chest pain, she underwent a Lexiscan MPS in October of 2020 that showed no evidence of ischemia or infarct. A TTE in October of 2020 showed EF 96-75% with diastolic dysfunction and RVSP 55-60 mmHg consistent with severe pulmonary hypertension.She subsequently underwentcardiac catheterization in April 2021 that showed patent stents with nonobstructive CAD in the LAD and pulmonary artery pressure of 36 consistent with pulmonary hypertension likely secondary to cardiac causes and pressure improved with sublingualNitroglycerin. She wore a 48 hour Holter monitor in July of 2021 that showed symptoms correlated with sinus rhythm and no significant arrhythmias.She was recently seen for an acute visit for complaints of dyspnea and chest pain with associated nausea and vomiting that would wake her up from sleep. She was initially taking 1 SL NTG with relief, but her symptoms rapidly progressed with increased frequency of chest pain requiring 3 SL NTG and 1 baby aspirin for relief. She also had worsened exercise intolerance on account of dyspnea. She was scheduled for a repeat heart cath on 08/09/2021 and had 60% ostial LAD lesion, LCx was large and dominant with a patent proximal stent and a distal 40% non-obstructive lesion, and small patent RCA. It was recommended to  optimize antianginal therapy with plans to consider IVUS of the LAD and CABG if symptoms persist despite adjustments in medical therapy. She's currently taking Lopressor 50 mg twice daily, amlodipine 2.5 mg once daily, Imdur 17m every morning and Ranexa 500 mg twice daily. Ranexa was added following her cardiac catheterization, and she admits to significant improvement with no recurrence of chest pain. She follows with Dr. MPasty Spillersand was started on Repatha injections. BP is a little elevated today, and she reports having increased stress with her husband's health. She admits to better controlled readings at other times. She denies dyspnea, chest pain/pressure/tightness, edema, orthopnea, PND or syncope.     Current Outpatient Medications   Medication Sig   . ALPRAZolam (XANAX) 0.5 mg Oral Tablet Take 0.5 mg by mouth Every evening   . amLODIPine (NORVASC) 2.5 mg Oral Tablet Take 1 Tablet (2.5 mg total) by mouth Once a day   . Cholecalciferol, Vitamin D3, 10 mcg (400 unit) Oral Tablet, Chewable Chew Once a day   . cloNIDine HCL (CATAPRES) 0.1 mg Oral Tablet TAKE 1 TABLET BY MOUTH THREE TIMES A DAY AS NEEDED FOR SYSTOLIC BP GREATER THAN OR EQUAL TO 150 (Patient taking differently: Take 0.1 mg by mouth Once a day)   . clopidogreL (PLAVIX) 75 mg Oral Tablet Take 75 mg by mouth Once a day   . evolocumab (REPATHA SURECLICK) 1916mg/mL Subcutaneous Pen Injector Inject 136munder the skin every 14 days.   . fluticasone propionate (FLONASE NASL) by Nasal route   .  Irbesartan-Hydrochlorothiazide 300-12.5 mg Oral Tablet Take 300 mg by mouth Once a day   . Isosorbide Mononitrate (IMDUR) 120 mg Oral Tablet Sustained Release 24 hr TAKE 1 TABLET (120 MG TOTAL) BY MOUTH EVERY MORNING   . levothyroxine (SYNTHROID) 100 mcg Oral Tablet Take 100 mcg by mouth Every morning   . metoprolol tartrate (LOPRESSOR) 50 mg Oral Tablet Take 1 Tablet (50 mg total) by mouth Twice daily   . nitroGLYCERIN (NITROSTAT) 0.4 mg Sublingual Tablet,  Sublingual PLACE 1 TABLET UNDER TONGUE EVERY 5 MINS, UP TO 3 DOSES AS NEEDED FOR CHEST PAIN   . pantoprazole (PROTONIX) 40 mg Oral Tablet, Delayed Release (E.C.) Take 40 mg by mouth Once a day   . POTASSIUM-99 ORAL Take by mouth Once a day   . ranolazine (RANEXA) 500 mg Oral Tablet Sustained Release 12 hr Take 1 Tablet (500 mg total) by mouth Twice daily for 90 days   . TRADJENTA 5 mg Oral Tablet Take 5 mg by mouth Once a day   . valACYclovir (VALTREX) 500 mg Oral Tablet Take 500 mg by mouth Twice daily   . vitamin B complex (B COMPLEX-VITAMIN B12 ORAL) Take by mouth Once a day     Allergies   Allergen Reactions   . Pneumococcal 23-Valent Polysaccharide Vaccine  Other Adverse Reaction (Add comment)     Severe local swelling   . Tramadol Nausea/ Vomiting     "makes me sick with or without food"   . Iohexol Hives/ Urticaria and  Other Adverse Reaction (Add comment)      Desc: HIVES SEVERAL HRS S/P IV CONTRAST.. [place] LAST SCAN W/ BENADRYL PREMED @ [facility] '01/AC.   Marland Kitchen Lisinopril      Other reaction(s): Cough   . Other Rash     Other reaction(s): Other (See Comments)  Nickle   When teeth were wired in she place, patient obtained an infection and wiring had to be removed,  Also allergic to silver   . Crestor [Rosuvastatin]      Muscle aches   . Lovastatin    . Statin [Atorvastatin]      Muscle ache   . Iv Contrast Hives/ Urticaria   . Nickel Hives/ Urticaria     Past Medical History:   Diagnosis Date   . Coronary artery disease    . Dyspnea on exertion    . Essential hypertension    . Hyperlipidemia    . PONV (postoperative nausea and vomiting)    . Stented coronary artery    . Type 2 diabetes mellitus (CMS HCC)          Past Surgical History:   Procedure Laterality Date   . CARDIAC CATHETERIZATION     . CATARACT EXTRACTION, BILATERAL Bilateral    . HX COLONOSCOPY     . HX HEART CATHETERIZATION  2017 and 2018    Stented twice  prox circ  and circ   . HX PARTIAL HYSTERECTOMY      tubes and ovaries left intact   . HX THYROIDECTOMY   1978   . HX WISDOM TEETH EXTRACTION     . KNEE SURGERY  2018    muscle removed   . LAPAROSCOPIC CHOLECYSTECTOMY     . RECONSTRUCTION OF NOSE  1980         Family Medical History:     Problem Relation (Age of Onset)    Cardiomyopathy Father    Heart Attack Mother    Heart Disease Sister  Social History     Tobacco Use   . Smoking status: Former Smoker     Packs/day: 0.50     Years: 10.00     Pack years: 5.00   . Smokeless tobacco: Never Used   Vaping Use   . Vaping Use: Never used   Substance Use Topics   . Alcohol use: Not Currently     Alcohol/week: 1.0 standard drink     Types: 1 Cans of beer per week     Comment: drank long time ago not current.   . Drug use: Never       Review of Systems   All other systems reviewed and are negative.    Objective:   Vitals: BP (!) 150/70 (Site: Right, Patient Position: Sitting)   Pulse 67   Ht 1.575 m (_0 )   Wt 75.8 kg (167 lb)   SpO2 98%   BMI 30.54 kg/m         Physical Exam  Vitals and nursing note reviewed.   Constitutional:       Appearance: She is well-developed.   Neck:      Vascular: No JVD.   Cardiovascular:      Rate and Rhythm: Normal rate and regular rhythm.      Pulses: Normal pulses.      Heart sounds: S1 normal and S2 normal. No murmur heard.    No friction rub. No gallop.   Pulmonary:      Effort: Pulmonary effort is normal. No respiratory distress.      Breath sounds: Normal breath sounds. No wheezing or rales.   Chest:      Chest wall: No tenderness.   Abdominal:      General: Bowel sounds are normal.      Palpations: Abdomen is soft.      Tenderness: There is no abdominal tenderness.   Musculoskeletal:         General: No tenderness. Normal range of motion.      Cervical back: Normal range of motion and neck supple.   Neurological:      Mental Status: She is alert and oriented to person, place, and time.   Psychiatric:         Behavior: Behavior normal.       Last Lipid Panel  (Last result in the past 2 years)      Cholesterol   HDL   LDL    Direct LDL   Triglycerides      05/24/21 0000 271   41   169     304      05/24/21 0000       180            Lab A1C Results:  HEMOGLOBIN A1C   Date Value Ref Range Status   05/24/2021 7.2 (H)  Final     Lab Results   Component Value Date    FREET4 1.19 05/24/2021       Labs 08/03/2021:  WBC 5.6, Hgb 12.3, HCT 36.0 and PLT 272.  Na 136, K 4.2, BUN 20, Cr 1.0 and eGFR 57.    EKG showed sinus rhythm with a HR 62bpm. QT/QTc 417/427m.    Assessment & Plan:     ENCOUNTER DIAGNOSES     ICD-10-CM   1. CAD in native artery  I25.10   2. S/P angioplasty with stent  Z95.820   3. HTN (hypertension)  I10   4. Hyperlipidemia  E78.5   5.  Type 2 diabetes mellitus (CMS HCC)  E11.9     In summary, [name] [name] is a pleasant 80 y.o. female with a PMH significant for CAD, hypertension, hyperlipidemia and diabetes mellitus presenting for follow up from cardiac catheterization. She has known CAD status post PCI to the left circumflexx 2 with most recent stent placement in 2018.She was recently seen for an acute visit for complaints of dyspnea and chest pain with associated nausea and vomiting that would wake her up from sleep. She was scheduled for a repeat heart cath on 08/09/2021 and had 60% ostial LAD lesion, LCx was large and dominant with a patent proximal stent and a distal 40% non-obstructive lesion, and small patent RCA. It was recommended to optimize antianginal therapy with plans to consider IVUS of the LAD and CABG if symptoms persist despite adjustments in medical therapy. She's currently taking Lopressor 50 mg twice daily, amlodipine 2.5 mg once daily, Imdur 146m every morning and Ranexa 500 mg twice daily. Ranexa was added following her cardiac catheterization, and she admits to significant improvement with no recurrence of chest pain. She follows with Dr. MPasty Spillersand was started on Repatha injections with [facility]. BP is a little elevated today, and she reports having increased  stress with her husband's health. She admits to better controlled readings at other times. She denies dyspnea, chest pain/pressure/tightness, edema, orthopnea, PND or syncope. Will continue current medical therapy. Will not increase amlodipine as she had intolerance in the past on higher doses (increased edema). She agreed to call with any changes in symptoms and/or condition prior to her next visit.    Orders Placed This Encounter   . ECG WITH INTERPRETATION/REPORT (AMB ONLY)   . amLODIPine (NORVASC) 2.5 mg Oral Tablet     Patient was seen independently with supervising physician not present but available for consultation.    Return in about 6 months (around 03/07/2022), or if symptoms worsen or fail to improve.    ASamuella Bruin PA-C  09/06/2021, 11:05  [facility]

## 2021-09-15 ENCOUNTER — Encounter (INDEPENDENT_AMBULATORY_CARE_PROVIDER_SITE_OTHER): Payer: Self-pay | Admitting: INTERNAL MEDICINE CARDIOVASCULAR DISEASE

## 2021-10-31 ENCOUNTER — Other Ambulatory Visit (HOSPITAL_COMMUNITY): Payer: Self-pay

## 2021-10-31 DIAGNOSIS — Z1231 Encounter for screening mammogram for malignant neoplasm of breast: Secondary | ICD-10-CM

## 2021-11-03 ENCOUNTER — Other Ambulatory Visit: Payer: Self-pay

## 2021-11-08 ENCOUNTER — Encounter (HOSPITAL_BASED_OUTPATIENT_CLINIC_OR_DEPARTMENT_OTHER): Payer: Self-pay

## 2021-11-08 ENCOUNTER — Other Ambulatory Visit: Payer: Self-pay

## 2021-11-08 ENCOUNTER — Inpatient Hospital Stay
Admission: RE | Admit: 2021-11-08 | Discharge: 2021-11-08 | Disposition: A | Discharge status: Home / Self Care | Payer: Medicare PPO | Source: Ambulatory Visit

## 2021-11-08 DIAGNOSIS — Z1231 Encounter for screening mammogram for malignant neoplasm of breast: Secondary | ICD-10-CM

## 2021-11-14 NOTE — Progress Notes (Signed)
[facility]  DEPARTMENT OF MEDICINE  [name], [place] [address]  [facility] Telemedicine    11/15/2021    Requesting Physician: No ref. provider found  History of Present Illness  [name] [name] is a 80 y.o. female who presents to the lipid clinic today via [place] telemedicine video visit for a return patient evaluation pertaining to hyperlipidemia management, within the setting of known ASCVD. Patient possesses a past medical history significant for CAD s/p PCI x3 with most recent in 2017 and 2018, type II diabetes mellitus, pulmonary HTN, HTN, and hyperlipidemia with statin intolerance. She is established with Dr. Deatra Canter.   The patient is currently tolerating Repatha 140 monotherapy well. She reports obtaining a blood specimen in October through her PCP, however we do not have the results yet. She states her PCP noting her cholesterol was "slightly above goal, but better than it was." No other concerns or complaints at this time.    Hyperlipidemia Hx:  In regards to past hyperlipidemia treatment, patient was previously prescribed Lipitor, Crestor, Simvastatin, Lovastatin (Altoprev). She discontinued the aforementioned statins due to leg myalgias. Additionally, patient admits to previous usage of Zetia. She reports taking Crestor and Zetia together, and developed myalgias within 6 months of starting. She discontinued both Crestor and Zetia in 03/2021 and notes an improvement in her myalgias. She is currently tolerating Repatha 140 monotherapy (08/2021) well. Patient's lipid panel from 05/24/2021 revealed a total cholesterol of 271, LDL of 180, TGs of 304, and HDL of 41. Reflective of no LLRx.     Diet and Lifestyle:  Patient consumes a large amount of lean beef, milk on her cereal, occasional cheese, and occasional real butter to cook. She does not use coconut oil. No significant egg intake.    Past History  Current Outpatient Medications   Medication Sig    ALPRAZolam (XANAX) 0.5 mg Oral Tablet  Take 0.5 mg by mouth Every evening    amLODIPine (NORVASC) 2.5 mg Oral Tablet Take 1 Tablet (2.5 mg total) by mouth Once a day    Cholecalciferol, Vitamin D3, 10 mcg (400 unit) Oral Tablet, Chewable Chew Once a day    cloNIDine HCL (CATAPRES) 0.1 mg Oral Tablet TAKE 1 TABLET BY MOUTH THREE TIMES A DAY AS NEEDED FOR SYSTOLIC BP GREATER THAN OR EQUAL TO 150 (Patient taking differently: Take 1 Tablet (0.1 mg total) by mouth Once a day)    clopidogreL (PLAVIX) 75 mg Oral Tablet Take 75 mg by mouth Once a day    evolocumab (REPATHA SURECLICK) [id number] mg/mL Subcutaneous Pen Injector Inject 65mL under the skin every 14 days.    fluticasone propionate (FLONASE NASL) by Nasal route    Irbesartan-Hydrochlorothiazide 300-12.5 mg Oral Tablet Take 300 mg by mouth Once a day    Isosorbide Mononitrate (IMDUR) 120 mg Oral Tablet Sustained Release 24 hr TAKE 1 TABLET (120 MG TOTAL) BY MOUTH EVERY MORNING    levothyroxine (SYNTHROID) 100 mcg Oral Tablet Take 100 mcg by mouth Every morning    metoprolol tartrate (LOPRESSOR) 50 mg Oral Tablet Take 1 Tablet (50 mg total) by mouth Twice daily    nitroGLYCERIN (NITROSTAT) 0.4 mg Sublingual Tablet, Sublingual PLACE 1 TABLET UNDER TONGUE EVERY 5 MINS, UP TO 3 DOSES AS NEEDED FOR CHEST PAIN    pantoprazole (PROTONIX) 40 mg Oral Tablet, Delayed Release (E.C.) Take 40 mg by mouth Once a day    POTASSIUM-99 ORAL Take by mouth Once a day    ranolazine (RANEXA) 500 mg Oral  Tablet Sustained Release 12 hr Take 1 Tablet (500 mg total) by mouth Twice daily for 90 days    TRADJENTA 5 mg Oral Tablet Take 5 mg by mouth Once a day    valACYclovir (VALTREX) 500 mg Oral Tablet Take 500 mg by mouth Twice daily    vitamin B complex (B COMPLEX-VITAMIN B12 ORAL) Take by mouth Once a day     Allergies   Allergen Reactions    Pneumococcal 23-Valent Polysaccharide Vaccine  Other Adverse Reaction (Add comment)     Severe local swelling    Tramadol Nausea/ Vomiting     "makes me sick with or without food"    Iohexol  Hives/ Urticaria and  Other Adverse Reaction (Add comment)      Desc: HIVES SEVERAL HRS S/P IV CONTRAST.. OK LAST SCAN W/ BENADRYL PREMED @ [facility] '01/AC.    Lisinopril      Other reaction(s): Cough    Other Rash     Other reaction(s): Other (See Comments)  Nickle   When teeth were wired in she place, patient obtained an infection and wiring had to be removed,  Also allergic to silver    Crestor [Rosuvastatin]      Muscle aches    Lovastatin     Statin [Atorvastatin]      Muscle ache    Iv Contrast Hives/ Urticaria    Nickel Hives/ Urticaria     Past Medical History:   Diagnosis Date    Coronary artery disease     Dyspnea on exertion     Essential hypertension     Hyperlipidemia     PONV (postoperative nausea and vomiting)     Stented coronary artery     Type 2 diabetes mellitus (CMS [place])      Past Surgical History:   Procedure Laterality Date    CARDIAC CATHETERIZATION      CATARACT EXTRACTION, BILATERAL Bilateral     HX COLONOSCOPY      HX HEART CATHETERIZATION  2017 and 2018    Stented twice  prox circ  and circ    HX PARTIAL HYSTERECTOMY      tubes and ovaries left intact    HX THYROIDECTOMY  1978    HX WISDOM TEETH EXTRACTION      KNEE SURGERY  2018    muscle removed    LAPAROSCOPIC CHOLECYSTECTOMY      RECONSTRUCTION OF NOSE  1980     Family History  Family Medical History:       Problem Relation (Age of Onset)    Cardiomyopathy Father    Heart Attack Mother    Heart Disease Sister    Uterine Cancer Mother, Niece          Social History  Social History     Socioeconomic History    Marital status: Married   Tobacco Use    Smoking status: Former     Packs/day: 0.50     Years: 10.00     Pack years: 5.00     Types: Cigarettes    Smokeless tobacco: Never   Vaping Use    Vaping Use: Never used   Substance and Sexual Activity    Alcohol use: Not Currently     Alcohol/week: 1.0 standard drink     Types: 1 Cans of beer per week     Comment: drank long time ago not current.    Drug use: Never   Other Topics Concern     Ability to  Walk 1 Flight of Steps without SOB/CP No    Ability To Do Own ADL's Yes     Review of Systems  Other than ROS in the HPI, all other systems were negative.  Examination    General Appearance: Patients appears well    BP (!) 108/47 (Site: Right, Patient Position: Sitting)   Pulse 61   Resp 18   Ht 1.588 m (5' 2.5")   Wt 74.4 kg (164 lb 1.6 oz)   SpO2 97%   BMI 29.54 kg/m     Diagnosis and Plan  1. Hyperlipidemia, unspecified hyperlipidemia type    2.     Statin/Zetia intolerance  3.     ASCVD  4.     Diabetes mellitus  5.     Hypothyroidism    Orders Placed This Encounter    LIPID PANEL    LDL CHOLESTEROL, DIRECT    APOLIPOPROTEIN B,SERUM     [name] [name] is a 80 y.o. female who presents to the lipid clinic today via [place] telemedicine for follow up pertaining to management of her hyperlipidemia, within the setting of known ASCVD. Patient also possesses a past medical history significant for CAD s/p PCI x3 with most recent in 2017 and 2018, type II diabetes mellitus, pulmonary HTN, HTN, and hyperlipidemia with statin intolerance. She is established with Dr. Karie Mainland.     The patient obtained a lipid panel in October reflecting the initial response to Repatha 140 monotherapy, however our records do not have these results. Baseline LDL is 180, above goal of <37. She is up for reauthorization which I will obtain blood work in clinic today. We will follow up in 1 year. If the patient were to have any questions or concerns in the meantime, she is welcome to contact me via telephone.    On the day of the encounter, a total of  30 minutes was spent on this patient encounter including review of historical information, examination, documentation and post-visit activities.       TELEMEDICINE DOCUMENTATION:    Patient Location:  [facility] [address], [place], [place] [address]    Patient/family aware of provider location:  yes  Patient/family consent for telemedicine:  yes  Examination observed and performed by:    Dr. Dorita Fray          I am scribing for, and in the presence of, Dr. Dorita Fray for services provided on 11/15/2021.  Marlowe Sax, [place]     I personally performed the services described in this documentation, as scribed  in my presence, and it is both accurate  and complete.    Hale Bogus MD, MD        Burman Foster, MD  Professor, Section of Cardiology  [facility] Department of Medicine    Please CC:  Marko Stai, PA-C  [address]  [place] [address]

## 2021-11-15 ENCOUNTER — Encounter (HOSPITAL_COMMUNITY): Payer: Self-pay | Admitting: Cardiovascular Disease

## 2021-11-15 ENCOUNTER — Ambulatory Visit: Payer: Medicare PPO | Attending: Cardiovascular Disease | Admitting: Cardiovascular Disease

## 2021-11-15 ENCOUNTER — Other Ambulatory Visit: Payer: Self-pay

## 2021-11-15 VITALS — BP 108/47 | HR 61 | Resp 18 | Ht 62.5 in | Wt 164.1 lb

## 2021-11-15 DIAGNOSIS — E039 Hypothyroidism, unspecified: Secondary | ICD-10-CM

## 2021-11-15 DIAGNOSIS — I2510 Atherosclerotic heart disease of native coronary artery without angina pectoris: Secondary | ICD-10-CM

## 2021-11-15 DIAGNOSIS — Z789 Other specified health status: Secondary | ICD-10-CM

## 2021-11-15 DIAGNOSIS — E785 Hyperlipidemia, unspecified: Secondary | ICD-10-CM | POA: Insufficient documentation

## 2021-11-15 DIAGNOSIS — E119 Type 2 diabetes mellitus without complications: Secondary | ICD-10-CM

## 2021-11-15 NOTE — Nursing Note (Signed)
Pt did not have a med list with them at visit - went over list with patient   Advised patient to check AVS list with medicines at home and call if any discrepancies    -Confirmed correct pharmacy with patient for e-scribing     [place], MA  11/15/2021, 14:32

## 2021-11-17 ENCOUNTER — Other Ambulatory Visit (HOSPITAL_COMMUNITY): Payer: Self-pay

## 2021-11-17 DIAGNOSIS — Z1231 Encounter for screening mammogram for malignant neoplasm of breast: Secondary | ICD-10-CM

## 2021-11-17 DIAGNOSIS — E785 Hyperlipidemia, unspecified: Secondary | ICD-10-CM

## 2021-11-17 LAB — LDL CHOLESTEROL, DIRECT: LDL CHOLESTEROL,DIRECT: 130

## 2021-11-17 LAB — LIPID PANEL
CHOLESTEROL: 222
HDL-CHOLESTEROL: 41
TRIGLYCERIDES: 354

## 2021-11-17 LAB — APOLIPOPROTEIN B,SERUM: APOLIPOPROTEIN B, PLASMA: 104

## 2021-11-22 NOTE — Result Encounter Note (Signed)
[name] [name]  Recent telemedicine lipid visit for patient who started Repatha earlier this year.  LDL has only come down 28% from 180-130.  She has taken Zetia and Crestor together in the past with myalgias.  See if she is willing to try taking Zetia all by itself.  Thanks  APM

## 2021-12-01 ENCOUNTER — Other Ambulatory Visit (HOSPITAL_COMMUNITY): Payer: Self-pay | Admitting: Family

## 2021-12-01 DIAGNOSIS — I2510 Atherosclerotic heart disease of native coronary artery without angina pectoris: Secondary | ICD-10-CM

## 2021-12-01 DIAGNOSIS — E785 Hyperlipidemia, unspecified: Secondary | ICD-10-CM

## 2021-12-01 MED ORDER — EZETIMIBE 10 MG TABLET
10.0000 mg | ORAL_TABLET | Freq: Every evening | ORAL | 3 refills | Status: DC
Start: 2021-12-01 — End: 2022-12-10

## 2022-01-02 ENCOUNTER — Other Ambulatory Visit (INDEPENDENT_AMBULATORY_CARE_PROVIDER_SITE_OTHER): Payer: Self-pay

## 2022-01-02 ENCOUNTER — Other Ambulatory Visit (HOSPITAL_COMMUNITY): Payer: Self-pay | Admitting: Cardiovascular Disease

## 2022-01-02 ENCOUNTER — Other Ambulatory Visit: Payer: Self-pay

## 2022-01-02 MED ORDER — REPATHA SURECLICK 140 MG/ML SUBCUTANEOUS PEN INJECTOR
PEN_INJECTOR | SUBCUTANEOUS | 12 refills | Status: DC
Start: 2022-01-02 — End: 2022-09-13
  Filled 2022-01-02: qty 6, 84d supply, fill #0
  Filled 2022-04-19: qty 6, 84d supply, fill #1
  Filled 2022-07-03: qty 6, 84d supply, fill #2

## 2022-01-02 NOTE — Telephone Encounter (Signed)
Prior authorization for Repatha submitted electronically on 01/02/2022 through CoverMyMeds. Key [id number]. Waiting for response from payor.    Genella Mech, Pharmacy Technician  01/02/2022, 15:07

## 2022-01-03 ENCOUNTER — Other Ambulatory Visit: Payer: Self-pay

## 2022-01-03 NOTE — Telephone Encounter (Signed)
Specialty Pharmacy Note    Prior Authorization for medication Repatha has been approved by payor [employer] until 12/02/22.  Approval notice has been scanned into Epic and can be found in the Media tab.    If you have any questions, don't hesitate to contact the Specialty Pharmacy Genella Mech, Pharmacy Technician  01/03/2022, 11:00

## 2022-01-09 ENCOUNTER — Other Ambulatory Visit: Payer: Self-pay

## 2022-01-10 ENCOUNTER — Other Ambulatory Visit: Payer: Self-pay

## 2022-01-11 ENCOUNTER — Other Ambulatory Visit (INDEPENDENT_AMBULATORY_CARE_PROVIDER_SITE_OTHER): Payer: Self-pay | Admitting: Medical

## 2022-01-15 ENCOUNTER — Other Ambulatory Visit: Payer: Self-pay

## 2022-01-17 ENCOUNTER — Other Ambulatory Visit: Payer: Self-pay

## 2022-01-17 ENCOUNTER — Observation Stay
Admission: EM | Admit: 2022-01-17 | Discharge: 2022-01-20 | Disposition: A | Discharge status: Home / Self Care | Payer: Medicare PPO | Source: Ambulatory Visit | Attending: Internal Medicine | Admitting: Internal Medicine

## 2022-01-17 ENCOUNTER — Encounter (INDEPENDENT_AMBULATORY_CARE_PROVIDER_SITE_OTHER): Payer: Self-pay

## 2022-01-17 ENCOUNTER — Observation Stay (HOSPITAL_COMMUNITY): Payer: Medicare PPO | Admitting: CARDIOVASCULAR DISEASE

## 2022-01-17 ENCOUNTER — Ambulatory Visit (INDEPENDENT_AMBULATORY_CARE_PROVIDER_SITE_OTHER): Payer: Medicare PPO | Admitting: Medical

## 2022-01-17 ENCOUNTER — Other Ambulatory Visit (HOSPITAL_COMMUNITY): Payer: Medicare PPO

## 2022-01-17 VITALS — BP 144/73 | HR 57 | Resp 18 | Ht 62.5 in | Wt 166.5 lb

## 2022-01-17 DIAGNOSIS — Z87891 Personal history of nicotine dependence: Secondary | ICD-10-CM | POA: Insufficient documentation

## 2022-01-17 DIAGNOSIS — Z7984 Long term (current) use of oral hypoglycemic drugs: Secondary | ICD-10-CM | POA: Insufficient documentation

## 2022-01-17 DIAGNOSIS — E039 Hypothyroidism, unspecified: Secondary | ICD-10-CM | POA: Insufficient documentation

## 2022-01-17 DIAGNOSIS — I2510 Atherosclerotic heart disease of native coronary artery without angina pectoris: Secondary | ICD-10-CM | POA: Diagnosis present

## 2022-01-17 DIAGNOSIS — R079 Chest pain, unspecified: Secondary | ICD-10-CM

## 2022-01-17 DIAGNOSIS — N289 Disorder of kidney and ureter, unspecified: Secondary | ICD-10-CM | POA: Insufficient documentation

## 2022-01-17 DIAGNOSIS — R5383 Other fatigue: Secondary | ICD-10-CM

## 2022-01-17 DIAGNOSIS — Z7902 Long term (current) use of antithrombotics/antiplatelets: Secondary | ICD-10-CM | POA: Insufficient documentation

## 2022-01-17 DIAGNOSIS — I10 Essential (primary) hypertension: Secondary | ICD-10-CM

## 2022-01-17 DIAGNOSIS — Z95820 Peripheral vascular angioplasty status with implants and grafts: Secondary | ICD-10-CM

## 2022-01-17 DIAGNOSIS — Z79899 Other long term (current) drug therapy: Secondary | ICD-10-CM | POA: Insufficient documentation

## 2022-01-17 DIAGNOSIS — I25110 Atherosclerotic heart disease of native coronary artery with unstable angina pectoris: Principal | ICD-10-CM | POA: Insufficient documentation

## 2022-01-17 DIAGNOSIS — Z955 Presence of coronary angioplasty implant and graft: Secondary | ICD-10-CM | POA: Insufficient documentation

## 2022-01-17 DIAGNOSIS — I249 Acute ischemic heart disease, unspecified: Secondary | ICD-10-CM

## 2022-01-17 DIAGNOSIS — Z7982 Long term (current) use of aspirin: Secondary | ICD-10-CM | POA: Insufficient documentation

## 2022-01-17 DIAGNOSIS — E782 Mixed hyperlipidemia: Secondary | ICD-10-CM | POA: Insufficient documentation

## 2022-01-17 DIAGNOSIS — Z79890 Hormone replacement therapy (postmenopausal): Secondary | ICD-10-CM | POA: Insufficient documentation

## 2022-01-17 DIAGNOSIS — E1165 Type 2 diabetes mellitus with hyperglycemia: Secondary | ICD-10-CM | POA: Insufficient documentation

## 2022-01-17 DIAGNOSIS — I200 Unstable angina: Secondary | ICD-10-CM

## 2022-01-17 DIAGNOSIS — R001 Bradycardia, unspecified: Secondary | ICD-10-CM

## 2022-01-17 DIAGNOSIS — R9431 Abnormal electrocardiogram [ECG] [EKG]: Secondary | ICD-10-CM | POA: Insufficient documentation

## 2022-01-17 DIAGNOSIS — E785 Hyperlipidemia, unspecified: Secondary | ICD-10-CM

## 2022-01-17 LAB — CBC WITH DIFF
BASOPHIL #: <0.1 10*3/uL (ref <=0.20)
BASOPHIL %: 1 %
EOSINOPHIL #: 0.18 10*3/uL (ref <=0.50)
EOSINOPHIL %: 2 %
HCT: 34.5 % — ABNORMAL LOW (ref 34.8–46.0)
HGB: 11.5 g/dL (ref 11.5–16.0)
IMMATURE GRANULOCYTE #: <0.1 10*3/uL (ref <0.10)
IMMATURE GRANULOCYTE %: 0 % (ref 0–1)
LYMPHOCYTE #: 3.07 10*3/uL (ref 1.00–4.80)
LYMPHOCYTE %: 39 %
MCH: 30 pg (ref 26.0–32.0)
MCHC: 33.3 g/dL (ref 31.0–35.5)
MCV: 90.1 fL (ref 78.0–100.0)
MONOCYTE #: 0.68 10*3/uL (ref 0.20–1.10)
MONOCYTE %: 9 %
MPV: 9.4 fL (ref 8.7–12.5)
NEUTROPHIL #: 3.89 10*3/uL (ref 1.50–7.70)
NEUTROPHIL %: 49 %
PLATELETS: 300 10*3/uL (ref 150–400)
RBC: 3.83 10*6/uL — ABNORMAL LOW (ref 3.85–5.22)
RDW-CV: 12.8 % (ref 11.5–15.5)
WBC: 7.9 10*3/uL (ref 3.7–11.0)

## 2022-01-17 LAB — BASIC METABOLIC PANEL
ANION GAP: 11 mmol/L (ref 4–13)
BUN/CREA RATIO: 16 (ref 6–22)
BUN: 21 mg/dL (ref 8–25)
CALCIUM: 10 mg/dL (ref 8.8–10.2)
CHLORIDE: 96 mmol/L (ref 96–111)
CO2 TOTAL: 23 mmol/L (ref 23–31)
CREATININE: 1.35 mg/dL — ABNORMAL HIGH (ref 0.60–1.05)
ESTIMATED GFR: 39 mL/min/BSA — ABNORMAL LOW (ref >=60)
GLUCOSE: 150 mg/dL — ABNORMAL HIGH (ref 65–125)
POTASSIUM: 4 mmol/L (ref 3.5–5.1)
SODIUM: 130 mmol/L — ABNORMAL LOW (ref 136–145)

## 2022-01-17 LAB — ECG 12-LEAD
Calculated P Axis: 63 degrees
PR Interval: 160 ms
Ventricular rate: 61 {beats}/min

## 2022-01-17 LAB — PT/INR
INR: 0.97 (ref 0.80–1.20)
PROTHROMBIN TIME: 11.2 seconds (ref 9.1–13.9)

## 2022-01-17 LAB — PTT (PARTIAL THROMBOPLASTIN TIME): APTT: 27.8 seconds (ref 24.2–37.5)

## 2022-01-17 LAB — TROPONIN-I (FOR ED ONLY): TROPONIN I: <7 ng/L (ref 0–30)

## 2022-01-17 NOTE — Nursing Note (Signed)
Pt did not have a med list with them at visit - went over list with patient   Advised patient to check AVS list with medicines at home and call if any discrepancies    -Confirmed correct pharmacy with patient for e-scribing   Ricka Burdock, MA  01/17/2022, 14:52

## 2022-01-17 NOTE — ED Provider Notes (Signed)
[facility] - Emergency Department  ED Primary Provider Note  History of Present Illness   Chief Complaint   Patient presents with    Chest Pain      Patient had chest pressure yesterday and the night before. Patient told to come in by Physician. No pain at this time       [name] [name] is a 81 y.o. female who had concerns including Chest Pain .  Arrival: The patient arrived by [employer] and is accompanied by husband    HPI    Patient complains of worsening intermittent chest pain and SOB. Patient states she has been having intermittent episodes of chest pain that goes into her neck and LUE for the past couple of months that have worsened over the last three days. She describes the chest pain as a pressure like sensation under her breasts. Patient endorses taking nitroglycerin today which helped alleviate her symptoms. Patient has had two cardiac stents placed, per patient. Patient reports having known cardiac blockages. She states she went to her PCP today who then referred her to the ED for a further workup secondary to her symptoms.    Per charting, PMHx of CAD, hyperlipidemia, hypertension, and diabetes.    Patient reports taking warfarin. Per charting, patient is prescribed Plavix.    Patient notes smoking cigarettes years ago.    Patient denies prior DVT or MI. She also denies fevers, chills, BLE edema, current chest pain, or any other symptoms at this time.     Review of Systems   Review of Systems   Constitutional: Negative for chills and fever.   Respiratory: Positive for shortness of breath.    Cardiovascular: Positive for chest pain. Negative for leg swelling.     Historical Data   History Reviewed This Encounter: Medical History   Surgical History   Family History   Social History      Physical Exam   ED Triage Vitals [01/17/22 1956]   BP (Non-Invasive) (!) 193/76   Heart Rate 68   Respiratory Rate 20   Temperature 36.6 C (97.9 F)   SpO2 95 %   Weight 76.9 kg (169 lb 8.5  oz)   Height 1.588 m (5' 2.5")     Physical Exam  Constitutional:       Appearance: Normal appearance.   HENT:      Nose: Nose normal.      Mouth/Throat:      Mouth: Mucous membranes are moist.      Pharynx: Oropharynx is clear.   Eyes:      Extraocular Movements: Extraocular movements intact.      Pupils: Pupils are equal, round, and reactive to light.   Cardiovascular:      Rate and Rhythm: Normal rate and regular rhythm.      Pulses: Normal pulses.      Heart sounds: Normal heart sounds.   Pulmonary:      Effort: Pulmonary effort is normal.      Breath sounds: Normal breath sounds.   Abdominal:      General: Abdomen is flat.      Palpations: Abdomen is soft.   Musculoskeletal:         General: No swelling. Normal range of motion.   Skin:     General: Skin is warm and dry.   Neurological:      General: No focal deficit present.      Mental Status: She is alert and oriented to person,  place, and time.       Patient Data     Labs Ordered/Reviewed   BASIC METABOLIC PANEL - Abnormal; Notable for the following components:       Result Value    SODIUM 130 (*)     GLUCOSE 150 (*)     CREATININE 1.35 (*)     ESTIMATED GFR 39 (*)     All other components within normal limits   CBC WITH DIFF - Abnormal; Notable for the following components:    RBC 3.83 (*)     HCT 34.5 (*)     All other components within normal limits   TROPONIN-I (FOR ED ONLY) - Normal   PT/INR - Normal    Narrative:     Coumadin therapy INR range for Conventional Anticoagulation is 2.0 to 3.0 and for Intensive Anticoagulation 2.5 to 3.5.   PTT (PARTIAL THROMBOPLASTIN TIME) - Normal    Narrative:     Therapeutic range for unfractionated heparin is 60-100 seconds.   CBC/DIFF    Narrative:     The following orders were created for panel order CBC/DIFF.  Procedure                               Abnormality         Status                     ---------                               -----------         ------                     CBC WITH [id number]                 Abnormal            Final result                 Please view results for these tests on the individual orders.     XR CHEST PA AND LATERAL   Final Result by Edi, Radresults In (02/15 2057)   Pulmonary edema pattern with no pleural effusion.      Staff revision: There is no evidence of pulmonary edema. No evidence for acute cardiopulmonary abnormality.        Medical Decision Making      MDM  ED Course as of 01/18/22 0136   Wed Jan 17, 2022   2125 Cards consult     This is an 81 year old female with known coronary artery disease with known stenotic coronary artery disease lesions who presents with progressive chest pain over the last 3 days.    Blood pressure 193/76 on presentation, vital signs otherwise within normal limits.  Physical exam is unremarkable.  Labs ordered.  These are notable for creatinine 1.35, sodium 130, otherwise unremarkable.  Troponin within normal limits.  EKG shows normal sinus rhythm, rate 61, normal axis, normal intervals, no signs of acute ischemia.  Chest x-ray ordered.  This shows no signs of acute cardio pulmonary abnormalities.    This is an 81 year old female who has had progressive chest pain over the last 3 days.  She does have some symptoms at rest concerning for unstable angina.  She has known stenotic coronary artery lesions.  She was  evaluated by her cardiology provider today and was sent to the emergency department for evaluation for complex PCI or coronary artery bypass grafting.  This patient's case was discussed with the cardiology team who recommend cardiac surgery evaluation.  The cardiac surgery team was consulted and will evaluate the patient.  The patient will be admitted to the cardiology service for further evaluation and treatment.  At time of sign-out, this patient was pending admission orders.  Please see the note from the oncoming team for any changes to disposition and treatment.    Medications Administered in the ED     Disposition pending at the time of  sign out. Care of [name] [name] was signed out to Dr. Raford Pitcher at 2345 following a discussion of the patient's course. Please refer to their Course Note for further details of the patient's ED course.       I am scribing for, in the presence of, Felix Ahmadi, MD, for services provided on 01/17/2022  // Grant Ruts, SCRIBE //    I personally performed the services described in this documentation, as scribed  in my presence, and it is both accurate  and complete.    Felix Ahmadi, MD  Vincenza Hews, MD 01/18/2022, 01:36   PGY-3 Emergency Medicine  [facility] [facility]

## 2022-01-17 NOTE — ED Nurses Note (Signed)
The risk and benefits have been discussed with the patient in regards to placing a peripheral intravenous catheter (PIV) and/or drawing labs in the triage area.  The patient was advised that they may have to wait in the ED waiting room after the PIV is placed.  The patient was advised not to tamper with the PIV or infuse anything through the PIV.  The patient was advised if there is any concern or problem with the PIV to contact a nurse or a staff representative to contact a nurse for the patient.  The patient was advised if they choose to leave before being seen by a provider or without treatment; the patient needs to notify the a nurse or a staff representative to contact a nurse, so the PIV can be removed.  The patient is aware not to leave the ED waiting room area while the PIV is in place.  The patient verbalizes understanding and agrees with the plan of action.    A 20g PIV was placed in the Right AC. After preparing the site with a Chlora-prep.  Labs were drawn, labeled (after checking the pt's I.D., band and labels confirming the pt's identification) and sent to the lab. The PIV was covered with a sterile dressing and secured with tape.

## 2022-01-17 NOTE — ED Attending Note (Signed)
Department of Emergency Medicine    Emergency Medicine Evaluation   The patient was seen with the resident physician Dr. Damita Dunnings.  I reviewed the management and plan with the resident prior to disposition.  I have reviewed his documentation and agree with its content except for any changes as documented below.   History     CC: [name] [name] is a 81 y.o. female who is here for CC of chest pain.      History of Present Illness:  . Patient complains of worsening intermittent chest pain and SOB. Patient states she has been having intermittent episodes of chest pain that goes into her neck and LUE for the past couple of months that have worsened over the last three days. She describes the chest pain as a pressure like sensation under her breasts. Patient endorses taking nitroglycerin today which helped alleviate her symptoms. Patient has had two cardiac stents placed, per patient. Patient reports having known cardiac blockages. She states she went to her PCP today who then referred her to the ED for a further workup secondary to her symptoms.   . Per charting, PMHx of CAD, hyperlipidemia, hypertension, and diabetes.   . Patient reports taking warfarin. Per charting, patient is prescribed Plavix.   . Patient notes smoking cigarettes years ago.   . Patient denies prior DVT or MI. She also denies fevers, chills, BLE edema, current chest pain, or any other symptoms at this time.   Additional HPI details per the resident note.      Meds/PMSHx  I have reviewed the PMHx, ROS, Social Hx, Meds & Allergies     Review of Systems  Constitutional: Negative for chills and fever.   Respiratory: Positive for shortness of breath.    Cardiovascular: Positive for chest pain. Negative for leg swelling.   All other systems per the HPI or are negative.      Physical  Vitals:   ED Triage Vitals [01/17/22 1956]   BP (Non-Invasive) (!) 193/76   Heart Rate 68   Respiratory Rate 20   Temperature 36.6 C (97.9 F)   SpO2 95 %   Weight 76.9 kg (169  lb 8.5 oz)   Height 1.588 m (5' 2.5")       Exam     Constitutional:       Appearance: Normal appearance.   HENT:      Nose: Nose normal.      Mouth/Throat:      Mouth: Mucous membranes are moist.      Pharynx: Oropharynx is clear.   Eyes:      Extraocular Movements: Extraocular movements intact.      Pupils: Pupils are equal, round, and reactive to light.   Cardiovascular:      Rate and Rhythm: Normal rate and regular rhythm.      Pulses: Normal pulses.      Heart sounds: Normal heart sounds.   Pulmonary:      Effort: Pulmonary effort is normal.      Breath sounds: Normal breath sounds.   Abdominal:      General: Abdomen is flat.      Palpations: Abdomen is soft.   Musculoskeletal:         General: No swelling. Normal range of motion.   Skin:     General: Skin is warm and dry.   Neurological:      General: No focal deficit present.      Mental Status: She is alert and oriented to  person, place, and time.     Limited Differential Diagnosis:    Acute coronary syndrome, dysrhythmia, pneumonia, pneumothorax, musculoskeletal etiology, gastroenterologic etiology, this clinical presentation is not consistent with pulmonary embolus or aortic vascular emergency but they have been considered.    Diagnostic Data and Review:   12 lead EKG per my interpretation shows a sinus rhythm rate of 61, normal axis, normal intervals, no acute ischemic changes.  When compared to study performed earlier in the day the anterior lateral infarct changes no longer present, no significant abnormalities.     Latest Reference Range & Units 01/17/22 20:11 01/17/22 21:40   WBC 3.7 - 11.0 x10^3/uL 7.9    HGB 11.5 - 16.0 g/dL 11.5    HCT 34.8 - 46.0 % 34.5 (L)    PLATELET COUNT 150 - 400 x10^3/uL 300    RBC 3.85 - 5.22 x10^6/uL 3.83 (L)    MCV 78.0 - 100.0 fL 90.1    MCHC 31.0 - 35.5 g/dL 33.3    MCH 26.0 - 32.0 pg 30.0    RDW-CV 11.5 - 15.5 % 12.8    MPV 8.7 - 12.5 fL 9.4    PMN'S % 49    LYMPHOCYTES % 39    EOSINOPHIL % 2    MONOCYTES % 9     BASOPHILS % 1    IMMATURE GRANULOCYTE % 0 - 1 % 0    IMMATURE GRANULOCYTE # <0.10 x10^3/uL <0.10    PMN ABS 1.50 - 7.70 x10^3/uL 3.89    LYMPHS ABS 1.00 - 4.80 x10^3/uL 3.07    EOS ABS <=0.50 x10^3/uL 0.18    MONOS ABS 0.20 - 1.10 x10^3/uL 0.68    BASOS ABS <=0.20 x10^3/uL <0.10    PROTHROMBIN TIME 9.1 - 13.9 seconds  11.2   INR 0.80 - 1.20   0.97   aPTT 24.2 - 37.5 seconds  27.8   SODIUM 136 - 145 mmol/L 130 (L)    POTASSIUM 3.5 - 5.1 mmol/L 4.0    CHLORIDE 96 - 111 mmol/L 96    CARBON DIOXIDE 23 - 31 mmol/L 23    BUN 8 - 25 mg/dL 21    CREATININE 0.60 - 1.05 mg/dL 1.35 (H)    GLUCOSE 65 - 125 mg/dL 150 (H)    ANION GAP 4 - 13 mmol/L 11    BUN/CREAT RATIO 6 - 22  16    ESTIMATED GLOMERULAR FILTRATION RATE >=60 mL/min/BSA 39 (L)    CALCIUM 8.8 - 10.2 mg/dL 10.0    TROPONIN-I 0 - 30 ng/L <7    HEMOGLOBIN A1C 4.0 - 5.6 % 6.1 (H)    ESTIMATED AVERAGE GLUCOSE mg/dL 128    (L): Data is abnormally low  (H): Data is abnormally high    The following radiographic studies are per my preliminary interpretation:  1.    Chest x-ray PA and lateral two view showed no pneumonia, no pneumothorax, normal mediastinum and cardiac silhouette, no fractures or dislocations, no acute disease.    Medical Decision Making   Medical Decision Making  82 year old female with known coronary artery disease with known stenotic coronary artery disease lesions who presents with progressive chest pain over the last 3 days.    Blood pressure 193/76 on presentation, vital signs otherwise within normal limits.  Physical exam is unremarkable.  Labs ordered.  These are notable for creatinine 1.35, sodium 130, otherwise unremarkable.  Troponin within normal limits.  EKG shows normal sinus rhythm, rate 61, normal axis, normal intervals,  no signs of acute ischemia.  Chest x-ray ordered.  This shows no signs of acute cardio pulmonary abnormalities.    Disposition pending at the time of sign out. Care of [name] [name] was signed out to Dr. Sheryn Bison at [phone]  following a discussion of the patient's course. Please refer to his Course Note for further details of the patient's ED course.    This is an 81 year old female who has had progressive chest pain over the last 3 days.  She does have some symptoms at rest concerning for unstable angina.  She has known stenotic coronary artery lesions.  She was evaluated by her cardiology provider today and was sent to the emergency department for evaluation for complex PCI or coronary artery bypass grafting.  This patient's case was discussed with the cardiology team who recommend cardiac surgery evaluation.  The cardiac surgery team was consulted and will evaluate the patient.  The patient will be admitted to the cardiology service for further evaluation and treatment.  At time of sign-out, this patient was pending admission orders.  Please see the note from the oncoming team for any changes to disposition and treatment.    ACS (acute coronary syndrome) (CMS HCC): acute illness or injury  Chest pain, unspecified type: chronic illness or injury  Chest pain: chronic illness or injury  Coronary artery disease involving native coronary artery of native heart with unstable angina pectoris (CMS [facility]): chronic illness or injury  Amount and/or Complexity of Data Reviewed  Labs: ordered.     Details:   Results as per above.  Radiology: ordered and independent interpretation performed.     Details:   Chest x-rays unremarkable as documented above.  ECG/medicine tests: ordered and independent interpretation performed.     Details:   Results as documented above.  No acute ischemia.      Risk  Prescription drug management.  Decision regarding hospitalization.         Emergency Department Course:  The patient remained hemodynamically stable and in no acute distress through the remainder of their ED course.     Procedures      Assessment     Clinical Impression:  1.  ACS/unstable angina  2.  Chest pain due to above  3.  Non-fasting  hyperglycemia  4.  Mild renal insufficiency      Plan:  The patient was signed out to the on-coming team, in stable condition, with final disposition pending decision for admission by Cards or CT surgery.       Claudia Desanctis, MD

## 2022-01-17 NOTE — ED Attending Handoff Note (Signed)
[facility] - Emergency Department  Emergency Department  Course Note    Care/report received from Dr. Hassell Done, Okey Regal, MD at  23:31.  Per report:  Kmiyah Golub is a 81 y.o. female who had concerns including Chest Pain . Has CAD found on Cath last year. Sent from [facility] clinic in [place] for admission for CABG vs complex PCI.     Pending labs/imaging/consults: Cards, Cardiac Surgery  Plan: Admit to cardiology    Course:      Patient will be admitted to the Cardiology service for further workup and management.    Disposition: Admitted      Clinical Impression   Chest pain, unspecified type (Primary)   Coronary artery disease involving native coronary artery of native heart with unstable angina pectoris (CMS HCC)         Georgetta Haber, MD, FACEP 01/17/2022, 23:31   Assistant Professor - Emergency Medicine  [facility]  Pager # - [place]

## 2022-01-17 NOTE — Progress Notes (Unsigned)
[place] [place]  [address]  [place] [address]  Phone: [phone]  Fax: [phone]    Encounter Date: 01/17/2022    Patient ID:  [name] [name]  [id number]    DOB:   Age: 81 y.o. female    Subjective:     Chief Complaint   Patient presents with   . Follow Up   . Chest Pain     chest pain and pressure    . Shortness of Breath     HPI   [name] [name] is a pleasant 81 y.o. female presenting for follow up. She has a PMH significant for CAD, HTN, HLD and DM. She has known CAD status post PCI to the left circumflexx 2 with most recent stent placement in 2018. She wore a 48 hour Holter monitor in July of 2021 that showed symptoms correlated with sinus rhythm and no significant arrhythmias. She most recently underwent heart cath on 08/09/2021 and had 60% ostial LAD lesion, LCx was large and dominant with a patent proximal stent and a distal 40% non-obstructive lesion and small patent RCA. It was recommended to optimize antianginal therapy with plans to consider IVUS of the LAD and CABG if symptoms persist despite adjustments in medical therapy. She called for an earlier appointment due to chest pain and dyspnea. She had an episode of severe chest pain about a month ago. Her husband tried to get her to go to the emergency department, but she declined. Her symptoms resolved about 40 minutes after taking 3 nitroglycerin. In the last 2 days, she has had 3 episodes of chest pain. She describes her pain as a heaviness in her chest with radiation to her left arm and jaw. Her symptoms improve about 30 minutes after taking nitroglycerin. Her most recent episode was yesterday afternoon, and she was unable to proceed with cardiac rehab at that time due to her symptoms. She also has had gradually worsened dyspnea on exertion and exertional fatigue for about 6 months. She notes occasional palpitations that are not necessarily associated with chest pain. She  denies any dizziness or syncope.       Current Outpatient Medications   Medication Sig   . ALPRAZolam (XANAX) 0.5 mg Oral Tablet Take 1 Tablet (0.5 mg total) by mouth Every evening   . amLODIPine (NORVASC) 2.5 mg Oral Tablet TAKE 1 TABLET (2.5 MG TOTAL) BY MOUTH EVERY DAY   . Cholecalciferol, Vitamin D3, 10 mcg (400 unit) Oral Tablet, Chewable Chew Once a day   . cloNIDine HCL (CATAPRES) 0.1 mg Oral Tablet TAKE 1 TABLET BY MOUTH THREE TIMES A DAY AS NEEDED FOR SYSTOLIC BP GREATER THAN OR EQUAL TO 150 (Patient taking differently: Take 1 Tablet (0.1 mg total) by mouth Once a day)   . clopidogreL (PLAVIX) 75 mg Oral Tablet Take 1 Tablet (75 mg total) by mouth Once a day   . evolocumab (REPATHA SURECLICK) [id number] mg/mL Subcutaneous Pen Injector Inject 1 pen (140 mg) subcutaneously (under the skin) once every 14 days   . ezetimibe (ZETIA) 10 mg Oral Tablet Take 1 Tablet (10 mg total) by mouth Every evening   . fluticasone propionate (FLONASE NASL) by Nasal route   . Irbesartan-Hydrochlorothiazide 300-12.5 mg Oral Tablet Take 300 mg by mouth Once a day   . Isosorbide Mononitrate (IMDUR) 120 mg Oral Tablet Sustained Release 24 hr TAKE 1 TABLET (120 MG TOTAL) BY MOUTH EVERY MORNING   . levothyroxine (SYNTHROID) 100 mcg Oral  Tablet Take 1 Tablet (100 mcg total) by mouth Every morning   . metoprolol tartrate (LOPRESSOR) 50 mg Oral Tablet Take 1 Tablet (50 mg total) by mouth Twice daily   . nitroGLYCERIN (NITROSTAT) 0.4 mg Sublingual Tablet, Sublingual PLACE 1 TABLET UNDER TONGUE EVERY 5 MINS, UP TO 3 DOSES AS NEEDED FOR CHEST PAIN   . pantoprazole (PROTONIX) 40 mg Oral Tablet, Delayed Release (E.C.) Take 1 Tablet (40 mg total) by mouth Once a day   . POTASSIUM-99 ORAL Take by mouth Once a day   . TRADJENTA 5 mg Oral Tablet Take 1 Tablet (5 mg total) by mouth Once a day   . valACYclovir (VALTREX) 500 mg Oral Tablet Take 1 Tablet (500 mg total) by mouth Twice daily   . vitamin B complex (B COMPLEX-VITAMIN B12 ORAL) Take by mouth  Once a day     Allergies   Allergen Reactions   . Pneumococcal 23-Valent Polysaccharide Vaccine  Other Adverse Reaction (Add comment)     Severe local swelling   . Tramadol Nausea/ Vomiting     "makes me sick with or without food"   . Iohexol Hives/ Urticaria and  Other Adverse Reaction (Add comment)      Desc: HIVES SEVERAL HRS S/P IV CONTRAST.. [place] LAST SCAN W/ BENADRYL PREMED @ [facility] '01/AC.   Marland Kitchen Lisinopril      Other reaction(s): Cough   . Other Rash     Other reaction(s): Other (See Comments)  Nickle   When teeth were wired in she place, patient obtained an infection and wiring had to be removed,  Also allergic to silver   . Crestor [Rosuvastatin]      Muscle aches   . Lovastatin    . Statin [Atorvastatin]      Muscle ache   . Iv Contrast Hives/ Urticaria   . Nickel Hives/ Urticaria     Past Medical History:   Diagnosis Date   . Coronary artery disease    . Dyspnea on exertion    . Essential hypertension    . Hyperlipidemia    . PONV (postoperative nausea and vomiting)    . Stented coronary artery    . Type 2 diabetes mellitus (CMS HCC)          Past Surgical History:   Procedure Laterality Date   . CARDIAC CATHETERIZATION     . CATARACT EXTRACTION, BILATERAL Bilateral    . HX COLONOSCOPY     . HX HEART CATHETERIZATION  2017 and 2018    Stented twice  prox circ  and circ   . HX PARTIAL HYSTERECTOMY      tubes and ovaries left intact   . HX THYROIDECTOMY  1978   . HX WISDOM TEETH EXTRACTION     . KNEE SURGERY  2018    muscle removed   . LAPAROSCOPIC CHOLECYSTECTOMY     . RECONSTRUCTION OF NOSE  1980         Family Medical History:     Problem Relation (Age of Onset)    Cardiomyopathy Father    Heart Attack Mother    Heart Disease Sister    Uterine Cancer Mother, Niece          Social History     Tobacco Use   . Smoking status: Former     Packs/day: 0.50     Years: 10.00     Pack years: 5.00     Types: Cigarettes   . Smokeless tobacco: Never  Vaping Use   . Vaping Use: Never used   Substance Use Topics   . Alcohol  use: Not Currently     Alcohol/week: 1.0 standard drink     Types: 1 Cans of beer per week     Comment: drank long time ago not current.   . Drug use: Never       Review of Systems   All other systems reviewed and are negative.    Objective:   Vitals: BP (!) 144/73 (Site: Right, Patient Position: Sitting)   Pulse 57   Resp 18   Ht 1.588 m (5' 2.5")   Wt 75.5 kg (166 lb 8 oz)   SpO2 96%   BMI 29.97 kg/m         Physical Exam  Vitals and nursing note reviewed.   Constitutional:       General: She is not in acute distress.     Appearance: She is not ill-appearing or diaphoretic.   HENT:      Head: Normocephalic and atraumatic.   Cardiovascular:      Rate and Rhythm: Normal rate and regular rhythm.      Heart sounds: No murmur heard.  Pulmonary:      Effort: Pulmonary effort is normal. No respiratory distress.      Breath sounds: Normal breath sounds. No wheezing or rales.   Musculoskeletal:      Cervical back: Normal range of motion and neck supple.      Right lower leg: No edema.      Left lower leg: No edema.   Skin:     General: Skin is warm and dry.   Neurological:      Mental Status: She is alert and oriented to person, place, and time.   Psychiatric:         Behavior: Behavior normal.         EKG today with sinus bradycardia and no acute ST-T changes.     Assessment & Plan:     ENCOUNTER DIAGNOSES     ICD-10-CM   1. Chest pain, unspecified type  R07.9   2. CAD in native artery  I25.10   3. S/P angioplasty with stent  Z95.820   4. HTN (hypertension)  I10   5. Hyperlipidemia  E78.5   6. Bradycardia  R00.1   7. Fatigue  R53.83     In summary, Ms. Frigon is a very pleasant 81 year old lady with CAD s/p PCI, HTN, HLD and DM. She most recently underwent heart cath on 08/09/2021 and had 60% ostial LAD lesion, LCx was large and dominant with a patent proximal stent and a distal 40% non-obstructive lesion and small patent RCA. It was recommended to optimize antianginal therapy with plans to consider IVUS of the LAD  and CABG if symptoms persist despite adjustments in medical therapy. She called for an earlier appointment due to chest pain and dyspnea. She had an episode of severe chest pain about a month ago. Her husband tried to get her to go to the emergency department, but she declined. Her symptoms resolved about 40 minutes after taking 3 nitroglycerin. In the last 2 days, she has had 3 episodes of chest pain. She describes her pain as a heaviness in her chest with radiation to her left arm and jaw. Her symptoms improve about 30 minutes after taking nitroglycerin. Her most recent episode was yesterday afternoon, and she was unable to proceed with cardiac rehab at that time due to  her symptoms. She also has had gradually worsened dyspnea on exertion and exertional fatigue for about 6 months. She notes occasional palpitations that are not necessarily associated with chest pain. She denies any dizziness or syncope. Case was reviewed with Dr. Deatra Canter and Dr. Billie Ruddy. They recommended that she report to the emergency department due to unstable angina for complex PCI vs. CABG evaluation. She is agreeable. Called and discussed with Dr Ted Mcalpine who is willing to see patient when she presents to or is transferred to [facility] and review films and present her with options.      Orders Placed This Encounter   . ECG 12 LEAD (IN CLINIC) SAME DAY     I independently of the faculty provider spent a total of 35 minutes in direct/indirect care of this patient including initial evaluation, review of laboratory, radiology, diagnostic studies, review of medical record, order entry and coordination of care.    Return in about 4 weeks (around 02/14/2022), or if symptoms worsen or fail to improve.    This patient was seen jointly with Dr. Deatra Canter (by telemedicine).     Ashlee O'Kernick, PA-C    I have seen and personally examined this patient. I have personally reviewed, and independently interpreted, all available imaging and clinical information. I then  personally formulated and directed the detailed management plan as documented above inclusive of my edits.     My substantive findings and recommendations are:  1. All of patient's available coronary angiograms were reviewed by me and discussed with Dr Billie Ruddy who reviewed them as well  2. Proximal LAD likely has the LCx stent protruding into it  3. Due to failed maximal medical therapy and angina at rest patient directed to go to ER  4. Ideally she should undergo single vessel LIMA to LAD    I saw the patient via video visit  She was in [place] office while I was in [place]    As part of this service, I spent 10 minutes on the clinical encounter, in independent patient care and counseling without APP overlap. This substantive direct/indirect care included initial evaluation, review of laboratory, radiology, diagnostic/imaging studies, review of medical record, order entry and coordination of care with other providers.     Please feel free to contact me should you have any questions.     Thank you for your referral to the [facility].    Sincerely,      Braxton Feathers

## 2022-01-17 NOTE — ED Resident Handoff Note (Signed)
Emergency Department  Resident Course Note    Patient Name: [name] [name]  Age and Gender: 81 y.o. female  Date of Birth:   Date of Service: 01/18/2022  PCP: Harriette Bouillon, PA-C  Attending: Claudia Desanctis, MD    After a thorough discussion of the patient including presentation, ED course, and review of above information I have assumed care of [name] [name] from Dr. Damita Dunnings at 23:31 01/17/2022    Triage Summary:   Chest Pain  (Patient had chest pressure yesterday and the night before. Patient told to come in by Physician. No pain at this time  )      HPI:  In brief, patient is a 81 y.o. White female presenting with    Chest pain, hx stenotic lesions medically managed, sent c/f worsening and possible need for CABG   Cards consulted and card surgery consulted, pending admission for chest pain and poss need for intervention    Pending Studies:  none    Plan:  Pending admission    Course:  After assuming care of [name] [name], ED course included the following:      Decision ultimately for Cardiology to admit the patient to their service and they recommend heparin drip which was started for the patient.  Admitted to cardiology service for further evaluation management and remained stable while in the ED requiring no further intervention.    Clinical Impression:     Clinical Impression   Chest pain, unspecified type (Primary)   Coronary artery disease involving native coronary artery of native heart with unstable angina pectoris (CMS [facility])       Disposition: Admitted    Patient will be admitted to Cardiology service for further evaluation and management.      Follow up:   No follow-up provider specified.    Allergies:   Allergies   Allergen Reactions   . Pneumococcal 23-Valent Polysaccharide Vaccine  Other Adverse Reaction (Add comment)     Severe local swelling   . Tramadol Nausea/ Vomiting     "makes me sick with or without food"   . Iohexol Hives/ Urticaria and  Other Adverse Reaction (Add comment)      Desc:  HIVES SEVERAL HRS S/P IV CONTRAST.. [place] LAST SCAN W/ BENADRYL PREMED @ [facility] '01/AC.   Marland Kitchen Lisinopril      Other reaction(s): Cough   . Other Rash     Other reaction(s): Other (See Comments)  Nickle   When teeth were wired in she place, patient obtained an infection and wiring had to be removed,  Also allergic to silver   . Crestor [Rosuvastatin]      Muscle aches   . Lovastatin    . Statin [Atorvastatin]      Muscle ache   . Iv Contrast Hives/ Urticaria   . Nickel Hives/ Urticaria       Pertinent Imaging/Lab results:  Labs Ordered/Reviewed   BASIC METABOLIC PANEL - Abnormal; Notable for the following components:       Result Value    SODIUM 130 (*)     GLUCOSE 150 (*)     CREATININE 1.35 (*)     ESTIMATED GFR 39 (*)     All other components within normal limits   CBC WITH DIFF - Abnormal; Notable for the following components:    RBC 3.83 (*)     HCT 34.5 (*)     All other components within normal limits   TROPONIN-I (FOR ED ONLY) - Normal  PT/INR - Normal    Narrative:     Coumadin therapy INR range for Conventional Anticoagulation is 2.0 to 3.0 and for Intensive Anticoagulation 2.5 to 3.5.   PTT (PARTIAL THROMBOPLASTIN TIME) - Normal    Narrative:     Therapeutic range for unfractionated heparin is 60-100 seconds.   CBC/DIFF    Narrative:     The following orders were created for panel order CBC/DIFF.  Procedure                               Abnormality         Status                     ---------                               -----------         ------                     CBC WITH [id number]:[id number]                Abnormal            Final result                 Please view results for these tests on the individual orders.     Results for orders placed or performed during the hospital encounter of 01/17/22   XR CHEST PA AND LATERAL     Status: None    Narrative    [name] [name]  Female, 81 years old.    XR CHEST PA AND LATERAL performed on 01/17/2022 8:29 PM.    REASON FOR EXAM:  chest pain    TECHNIQUE: 2 views/2 images  submitted for interpretation.    COMPARISON: No relevant prior studies are available for comparison.    FINDINGS:   The cardiomediastinal silhouette is within normal limits of size. There is central vascular congestion and diffuse interstitial prominence. No pleural effusion or visible pneumothorax. Spinal alignment is preserved.      Impression    Pulmonary edema pattern with no pleural effusion.    Staff revision: There is no evidence of pulmonary edema. No evidence for acute cardiopulmonary abnormality.       / Jackquline Denmark, MD 01/17/2022, 23:31   PGY-3 Emergency Medicine  [facility]  Pager # - [phone]    *Parts of this patients chart were completed in a retrospective fashion due to simultaneous direct patient care activities in the Emergency Department.   *This note was partially generated using MModal Fluency Direct system, and there may be some incorrect words, spellings, and punctuation that were not noted in checking the note before saving.

## 2022-01-18 ENCOUNTER — Encounter (HOSPITAL_COMMUNITY): Payer: Self-pay | Admitting: Student in an Organized Health Care Education/Training Program

## 2022-01-18 DIAGNOSIS — R9431 Abnormal electrocardiogram [ECG] [EKG]: Secondary | ICD-10-CM

## 2022-01-18 DIAGNOSIS — I200 Unstable angina: Secondary | ICD-10-CM

## 2022-01-18 DIAGNOSIS — I2510 Atherosclerotic heart disease of native coronary artery without angina pectoris: Secondary | ICD-10-CM | POA: Diagnosis present

## 2022-01-18 DIAGNOSIS — E782 Mixed hyperlipidemia: Secondary | ICD-10-CM

## 2022-01-18 DIAGNOSIS — R45851 Suicidal ideations: Secondary | ICD-10-CM

## 2022-01-18 LAB — PTT (PARTIAL THROMBOPLASTIN TIME)
APTT: 43 seconds — ABNORMAL HIGH (ref 24.2–37.5)
APTT: 44.3 seconds — ABNORMAL HIGH (ref 24.2–37.5)
APTT: 36.2 seconds (ref 24.2–37.5)

## 2022-01-18 LAB — ECG 12-LEAD
Atrial Rate: 61 {beats}/min
Atrial Rate: 78 {beats}/min
Atrial Rate: 63 {beats}/min
Calculated P Axis: 53 degrees
Calculated P Axis: 47 degrees
Calculated R Axis: 28 degrees
Calculated R Axis: 5 degrees
Calculated R Axis: 6 degrees
Calculated T Axis: 60 degrees
Calculated T Axis: 48 degrees
Calculated T Axis: 52 degrees
PR Interval: 152 ms
PR Interval: 158 ms
QRS Duration: 98 ms
QRS Duration: 96 ms
QRS Duration: 96 ms
QT Interval: 456 ms
QT Interval: 418 ms
QT Interval: 484 ms
QTC Calculation: 459 ms
QTC Calculation: 476 ms
QTC Calculation: 495 ms
Ventricular rate: 78 {beats}/min
Ventricular rate: 63 {beats}/min

## 2022-01-18 LAB — BASIC METABOLIC PANEL
ANION GAP: 13 mmol/L (ref 4–13)
BUN/CREA RATIO: 18 (ref 6–22)
BUN: 19 mg/dL (ref 8–25)
CALCIUM: 9.8 mg/dL (ref 8.8–10.2)
CHLORIDE: 100 mmol/L (ref 96–111)
CO2 TOTAL: 23 mmol/L (ref 23–31)
CREATININE: 1.06 mg/dL — ABNORMAL HIGH (ref 0.60–1.05)
ESTIMATED GFR: 53 mL/min/BSA — ABNORMAL LOW (ref >=60)
GLUCOSE: 117 mg/dL (ref 65–125)
POTASSIUM: 4.2 mmol/L (ref 3.5–5.1)
SODIUM: 136 mmol/L (ref 136–145)

## 2022-01-18 LAB — POC BLOOD GLUCOSE (RESULTS)
GLUCOSE, POC: 111 mg/dl — ABNORMAL HIGH (ref 70–105)
GLUCOSE, POC: 108 mg/dl — ABNORMAL HIGH (ref 70–105)
GLUCOSE, POC: 143 mg/dl — ABNORMAL HIGH (ref 70–105)
GLUCOSE, POC: 92 mg/dl (ref 70–105)

## 2022-01-18 LAB — HGA1C (HEMOGLOBIN A1C WITH EST AVG GLUCOSE)
ESTIMATED AVERAGE GLUCOSE: 128 mg/dL
HEMOGLOBIN A1C: 6.1 % — ABNORMAL HIGH (ref 4.0–5.6)

## 2022-01-18 LAB — MAGNESIUM: MAGNESIUM: 1.8 mg/dL (ref 1.8–2.6)

## 2022-01-18 LAB — TROPONIN-I
TROPONIN I: 194 ng/L — ABNORMAL HIGH (ref 0–30)
TROPONIN I: 10 ng/L (ref 0–30)

## 2022-01-18 LAB — PHOSPHORUS: PHOSPHORUS: 3 mg/dL (ref 2.3–4.0)

## 2022-01-18 MED ORDER — SODIUM CHLORIDE 0.9% FLUSH BAG - 250 ML
INTRAVENOUS | Status: DC | PRN
Start: 2022-01-18 — End: 2022-01-20

## 2022-01-18 MED ORDER — METOPROLOL TARTRATE 50 MG TABLET
50.0000 mg | ORAL_TABLET | Freq: Two times a day (BID) | ORAL | Status: DC
Start: 2022-01-18 — End: 2022-01-18
  Administered 2022-01-18: 50 mg via ORAL
  Filled 2022-01-18: qty 1

## 2022-01-18 MED ORDER — LEVOTHYROXINE 100 MCG TABLET
100.0000 ug | ORAL_TABLET | Freq: Every morning | ORAL | Status: DC
Start: 2022-01-18 — End: 2022-01-20
  Administered 2022-01-18 – 2022-01-20 (×3): 100 ug via ORAL
  Filled 2022-01-18 (×3): qty 1

## 2022-01-18 MED ORDER — ACETAMINOPHEN 325 MG TABLET
650.0000 mg | ORAL_TABLET | ORAL | Status: DC | PRN
Start: 2022-01-18 — End: 2022-01-20
  Administered 2022-01-18: 650 mg via ORAL
  Administered 2022-01-18: 0 mg via ORAL
  Filled 2022-01-18 (×2): qty 2

## 2022-01-18 MED ORDER — SODIUM CHLORIDE 0.9 % INJECTION SOLUTION
2.0000 mL | INTRAVENOUS | Status: DC
Start: 2022-01-18 — End: 2022-01-20
  Administered 2022-01-20: 2 mL via INTRAVENOUS

## 2022-01-18 MED ORDER — AMLODIPINE 5 MG TABLET
2.5000 mg | ORAL_TABLET | Freq: Every day | ORAL | Status: DC
Start: 2022-01-18 — End: 2022-01-18
  Administered 2022-01-18: 2.5 mg via ORAL
  Filled 2022-01-18: qty 1

## 2022-01-18 MED ORDER — NITROGLYCERIN 0.4 MG SUBLINGUAL TABLET
0.4000 mg | SUBLINGUAL_TABLET | SUBLINGUAL | Status: DC | PRN
Start: 2022-01-18 — End: 2022-01-20
  Administered 2022-01-18 – 2022-01-19 (×5): 0.4 mg via SUBLINGUAL
  Filled 2022-01-18 (×2): qty 25

## 2022-01-18 MED ORDER — METOPROLOL TARTRATE 50 MG TABLET
75.0000 mg | ORAL_TABLET | Freq: Two times a day (BID) | ORAL | Status: DC
Start: 2022-01-18 — End: 2022-01-20
  Administered 2022-01-18 – 2022-01-20 (×4): 75 mg via ORAL
  Filled 2022-01-18 (×4): qty 1

## 2022-01-18 MED ORDER — SODIUM CHLORIDE 0.9 % (FLUSH) INJECTION SYRINGE
2.0000 mL | INJECTION | Freq: Three times a day (TID) | INTRAMUSCULAR | Status: DC
Start: 2022-01-18 — End: 2022-01-20
  Administered 2022-01-18 (×3): 0 mL
  Administered 2022-01-19: 6 mL
  Administered 2022-01-19 – 2022-01-20 (×3): 0 mL

## 2022-01-18 MED ORDER — INSULIN LISPRO 100 UNIT/ML SUB-Q SSIP
0.0000 [IU] | INJECTION | Freq: Four times a day (QID) | SUBCUTANEOUS | Status: DC | PRN
Start: 2022-01-18 — End: 2022-01-20
  Administered 2022-01-19: 2 [IU] via SUBCUTANEOUS
  Filled 2022-01-18: qty 6

## 2022-01-18 MED ORDER — METOPROLOL TARTRATE 25 MG TABLET
25.0000 mg | ORAL_TABLET | ORAL | Status: AC
Start: 2022-01-18 — End: 2022-01-18
  Administered 2022-01-18: 25 mg via ORAL
  Filled 2022-01-18: qty 1

## 2022-01-18 MED ORDER — HEPARIN 25,000 UNIT/250 ML (100 UNIT/ML) IN NS IV SOLN
12.0000 [IU]/kg/h | INTRAVENOUS | Status: DC
Start: 2022-01-18 — End: 2022-01-19
  Administered 2022-01-18: 12 [IU]/kg/h via INTRAVENOUS
  Administered 2022-01-18: 16 [IU]/kg/h via INTRAVENOUS
  Administered 2022-01-18: 20 [IU]/kg/h via INTRAVENOUS
  Administered 2022-01-18: 14 [IU]/kg/h via INTRAVENOUS
  Administered 2022-01-18: 12 [IU]/kg/h via INTRAVENOUS
  Administered 2022-01-18: 16 [IU]/kg/h via INTRAVENOUS
  Administered 2022-01-18: 12 [IU]/kg/h via INTRAVENOUS
  Administered 2022-01-19: 0 [IU]/kg/h via INTRAVENOUS
  Administered 2022-01-19: 19 [IU]/kg/h via INTRAVENOUS
  Administered 2022-01-19: 20 [IU]/kg/h via INTRAVENOUS
  Administered 2022-01-19: 19 [IU]/kg/h via INTRAVENOUS
  Filled 2022-01-18 (×2): qty 250

## 2022-01-18 MED ORDER — PANTOPRAZOLE 40 MG TABLET,DELAYED RELEASE
40.0000 mg | DELAYED_RELEASE_TABLET | Freq: Every day | ORAL | Status: DC
Start: 2022-01-18 — End: 2022-01-20
  Administered 2022-01-18 – 2022-01-20 (×3): 40 mg via ORAL
  Filled 2022-01-18 (×3): qty 1

## 2022-01-18 MED ORDER — AMLODIPINE 5 MG TABLET
5.0000 mg | ORAL_TABLET | Freq: Every day | ORAL | Status: DC
Start: 2022-01-19 — End: 2022-01-20
  Administered 2022-01-19 – 2022-01-20 (×2): 5 mg via ORAL
  Filled 2022-01-18 (×2): qty 1

## 2022-01-18 MED ORDER — EZETIMIBE 10 MG TABLET
10.0000 mg | ORAL_TABLET | Freq: Every evening | ORAL | Status: DC
Start: 2022-01-18 — End: 2022-01-20
  Administered 2022-01-18: 0 mg via ORAL
  Administered 2022-01-18 – 2022-01-19 (×2): 10 mg via ORAL
  Filled 2022-01-18 (×2): qty 1

## 2022-01-18 MED ORDER — HEPARIN (PORCINE) 5,000 UNITS/ML BOLUS FOR DOSE ADJUSTMENT
50.0000 [IU]/kg | INTRAMUSCULAR | Status: AC
Start: 2022-01-18 — End: 2022-01-18
  Administered 2022-01-18: 3000 [IU] via INTRAVENOUS
  Filled 2022-01-18: qty 1

## 2022-01-18 MED ORDER — AMLODIPINE 5 MG TABLET
2.5000 mg | ORAL_TABLET | ORAL | Status: AC
Start: 2022-01-18 — End: 2022-01-18
  Administered 2022-01-18: 2.5 mg via ORAL
  Filled 2022-01-18: qty 1

## 2022-01-18 MED ORDER — DEXTROSE 5% IN WATER (D5W) FLUSH BAG - 250 ML
INTRAVENOUS | Status: DC | PRN
Start: 2022-01-18 — End: 2022-01-20

## 2022-01-18 MED ORDER — SODIUM CHLORIDE 0.9 % (FLUSH) INJECTION SYRINGE
2.0000 mL | INJECTION | INTRAMUSCULAR | Status: DC | PRN
Start: 2022-01-18 — End: 2022-01-20

## 2022-01-18 MED ORDER — HYDROCHLOROTHIAZIDE 25 MG TABLET
12.5000 mg | ORAL_TABLET | Freq: Every day | ORAL | Status: DC
Start: 2022-01-18 — End: 2022-01-20
  Administered 2022-01-18 – 2022-01-20 (×3): 12.5 mg via ORAL
  Filled 2022-01-18 (×3): qty 1

## 2022-01-18 MED ORDER — MAGNESIUM SULFATE 4 GRAM/100 ML (4 %) IN WATER INTRAVENOUS PIGGYBACK
4.0000 g | INJECTION | Freq: Once | INTRAVENOUS | Status: AC
Start: 2022-01-18 — End: 2022-01-18
  Administered 2022-01-18: 4 g via INTRAVENOUS
  Administered 2022-01-18: 0 g via INTRAVENOUS
  Filled 2022-01-18: qty 100

## 2022-01-18 MED ORDER — ISOSORBIDE MONONITRATE ER 60 MG TABLET,EXTENDED RELEASE 24 HR
120.0000 mg | ORAL_TABLET | Freq: Every morning | ORAL | Status: DC
Start: 2022-01-18 — End: 2022-01-20
  Administered 2022-01-18 – 2022-01-20 (×3): 120 mg via ORAL
  Filled 2022-01-18 (×3): qty 2

## 2022-01-18 MED ORDER — HEPARIN (PORCINE) 5,000 UNITS/ML BOLUS
60.0000 [IU]/kg | Freq: Once | INTRAMUSCULAR | Status: AC
Start: 2022-01-18 — End: 2022-01-18
  Administered 2022-01-18: 3500 [IU] via INTRAVENOUS
  Filled 2022-01-18: qty 1

## 2022-01-18 MED ORDER — ASPIRIN 81 MG CHEWABLE TABLET
81.0000 mg | CHEWABLE_TABLET | Freq: Every day | ORAL | Status: DC
Start: 2022-01-18 — End: 2022-01-20
  Administered 2022-01-18 – 2022-01-20 (×3): 81 mg via ORAL
  Filled 2022-01-18 (×3): qty 1

## 2022-01-18 NOTE — Progress Notes (Signed)
Interval Progress Note:    RN paged regarding patient having chest pain. Assessed patient at bedside. Admits to chest pain with radiation to left arm and jaw. Patient was given nitro with improvement in pain. Hemodynamically stable.     Plan:  -stat ECG and troponin  -nitro PRN   Delena Bali, DO  01/18/2022, 13:11

## 2022-01-18 NOTE — Care Plan (Signed)
[facility]  Physical Therapy Initial Evaluation    Patient Name: [name] [name]  Date of Birth:   Height: Height: 158.8 cm (5' 2.5")  Weight: Weight: 74.1 kg (163 lb 5.8 oz)  Room/Bed: 16/A  Payor: [place] MEDICARE / Plan: [employer] MEDICARE ADVANTAGE PPO / Product Type: PPO /     Assessment:      [name] [name] tolerated PT evaluation fairly well on this date, demonstrates decrease in functional activity tolerance and ambulation ability from baseline 2/2 increased fatigue and SOB with exertion at this time. However, anticipate pt will progress well with therapy and that pt will be able to return home with assist once medically ready for d/c    Discharge Needs:    Equipment Recommendation: none anticipated  Discharge Disposition: home with assist    JUSTIFICATION OF DISCHARGE RECOMMENDATION   Based on current diagnosis, functional performance prior to admission, and current functional performance, this patient requires continued PT services in home with assist in order to achieve significant functional improvements in these deficit areas: aerobic capacity/endurance, gait, locomotion, and balance, motor function, muscle performance, neuromuscular, posture, ROM (range of motion).    Plan:   Current Intervention: balance training, bed mobility training, gait training, home exercise program, patient/family education, postural re-education, ROM (range of motion), stair training, strengthening, stretching, transfer training  To provide physical therapy services 1x/day, minimum of 2x/week  for duration of until discharge.    The risks/benefits of therapy have been discussed with the patient/caregiver and he/she is in agreement with the established plan of care.       Subjective & Objective        01/18/22 1054   Therapist Pager   PT Assigned/ Pager # [place] [phone]   Rehab Session   Document Type evaluation   Total PT Minutes: 15   Patient Effort good   Symptoms Noted During/After Treatment  fatigue   General Information   Patient Profile Reviewed yes   Onset of Illness/Injury or Date of Surgery 01/17/22   Pertinent History of Current Functional Problem Briefly, she is a very pleasant 81 year old female with prior medical history of coronary artery disease status post PCI to LCX x2, essential hypertension mixed dyslipidemia and type 2 diabetes mellitus.  She had been having ongoing symptoms of anginal chest pain, and underwent a cardiac catheterization in August of 2022 showing 60% ostial LAD lesion, large LCX with patent proximal stent, distal 40% nonobstructive lesion, small patent RCA.  At that time it was recommended to optimize antianginal therapy with plans to consider IVUS of the LAD and CABG if symptoms persist despite adjustment in medical therapy.  Recently she has been having worsening symptoms of chest pain, lasting for about 40 minutes to 1 hour.  In the last 2 days she reports 3 episodes of chest pain which she describes as retrosternal chest heaviness with radiation to the left arm and jaw, with relief with sublingual nitroglycerin.  Given worsening episodes of chest pain, with known underlying LAD disease, she was referred to [facility] hospital further management.  She is currently chest pain free, vital signs stable and denies any active cardiac complaints.   Medical Lines PIV Line;Telemetry   Respiratory Status room air   Existing Precautions/Restrictions full code;fall precautions   General Observations of Patient Pt supine in bed upon arrival, pleasant and cooperative. Agreeable to therapy at this time   Mutuality/Individual Preferences   Individualized Care Needs OOB CGA x1   Patient-Specific Goals (Include  Timeframe) get better, get home   Plan of Care Reviewed With patient;spouse   Patient would like to participate in bedside shift report No   Living Environment   Lives With spouse   Living Arrangements house   Home Accessibility stairs to enter home   Transportation Available family  or friend will provide   Home Main Entrance   Number of Stairs, Main Entrance five   Functional Level Prior   Ambulation 0 - independent   Transferring 0 - independent   Toileting 0 - independent   Bathing 0 - independent   Dressing 0 - independent   Eating 0 - independent   Communication 0 - understands/communicates without difficulty   Prior Functional Level Comment reports she is fully independent at her baseline   Self-Care   Usual Activity Tolerance good   Current Activity Tolerance moderate   Equipment Currently Used at Home yes   Equipment Currently Used at Home cane, straight   Equipment Brought to Hospital none   Activity/Exercise/Self-Care Comment has a cane "if she needs it":   Pre Treatment Status   Pre Treatment Patient Status Patient supine in bed;Call light within reach;Telephone within reach;Sitter select activated;Nurse approved session   Support Present Pre Treatment  Family present   Communication Pre Treatment  Nurse   Communication Pre Treatment Comment RN approved PT and OT session   Cognitive Assessment/Interventions   Behavior/Mood Observations alert;cooperative   Orientation Status oriented x 4   Attention WNL/WFL   Follows Commands WNL   Vital Signs   Pre-Treatment Heart Rate (beats/min) 54   Post-treatment Heart Rate (beats/min) 73   Pre Treatment BP 127/52 (73)   Post Treatment BP 165/54 (86)   O2 Delivery Pre Treatment room air   Post SpO2 (%) 96   O2 Delivery Post Treatment room air   Vitals Comment VSS on RA   Pain Assessment   Pretreatment Pain Rating 0/10 - no pain   Posttreatment Pain Rating 0/10 - no pain   RLE Assessment   RLE Assessment WFL- Within Functional Limits   LLE Assessment   LLE Assessment WFL- Within Functional Limits   Bed Mobility Assessment/Treatment   Supine-Sit Independence stand-by assistance   Sit to Supine, Independence stand-by assistance   Impairments flexibility decreased;endurance;ROM decreased;postural control impaired;strength decreased   Transfer  Assessment/Treatment   Sit-Stand Independence stand-by assistance   Stand-Sit Independence stand-by assistance   Transfer Safety Issues balance decreased during turns;sequencing ability decreased;step length decreased;weight-shifting ability decreased   Transfer Impairments balance impaired;endurance;coordination impaired;flexibility decreased;motor control impaired;postural control impaired;ROM decreased;strength decreased   Gait Assessment/Treatment   Total Distance Ambulated 125   Independence  contact guard assist   Assistive Device  side by side   Distance in Feet 125   Gait Speed decreased/slowed   Deviations  cadence decreased;narrow BOS;step length decreased   Safety Issues  sequencing ability decreased;balance decreased during turns;step length decreased;weight-shifting ability decreased   Impairments  balance impaired;coordination impaired;endurance;flexibility decreased;motor control impaired;postural control impaired;ROM decreased;strength decreased   Balance Skill Training   Comment with SBA   Sitting Balance: Static good balance   Sitting, Dynamic (Balance) good balance   Sit-to-Stand Balance fair + balance   Standing Balance: Static fair + balance   Standing Balance: Dynamic fair + balance   Systems Impairment Contributing to Balance Disturbance musculoskeletal   Identified Impairments Contributing to Balance Disturbance impaired postural control;decreased ROM;decreased strength   Functional Endurance Training   Comment, Functional Endurance fair +   Post Treatment  Status   Post Treatment Patient Status Patient supine in bed;Call light within reach;Telephone within reach;Consulting civil engineer Treatment Comment updated on pt performance   Plan of Care Review   Plan Of Care Reviewed With patient;spouse   Basic Mobility Am-PAC/6Clicks Score (APPROVED PT Staff, [facility], [facility] ONLY, [place], [place], and F[facility])    Turning in bed without bedrails 4   Lying on back to sitting on edge of flat bed 4   Moving to and from a bed to a chair 3   Standing up from chair 3   Walk in room 3   Climbing 3-5 steps with railing 1   6 Clicks Raw Score total 18   Standardized (t-scale) score 41.05   CMS 0-100% Score 40.47   CMS Modifier CK   Patient Mobility Goal (JHHLM) 7- Walk 25 feet or more 3X/day   Exercise/Activity Level Performed 7- Walked 25 feet or more   Functional Impairment   Overall Functional Impairments/Problem List balance impaired;endurance;coordination impaired;flexibility decreased;postural control impaired;ROM decreased;strength decreased   Physical Therapy Clinical Impression   Assessment Mrs. Fecher tolerated PT evaluation fairly well on this date, demonstrates decrease in functional activity tolerance and ambulation ability from baseline 2/2 increased fatigue and SOB with exertion at this time. However, anticipate pt will progress well with therapy and that pt will be able to return home with assist once medically ready for d/c   Patient/Family Goals Statement get better, get home   Criteria for Skilled Therapeutic yes;meets criteria;skilled treatment is necessary   Pathology/Pathophysiology Noted musculoskeletal;cardiovascular;pulmonary   Impairments Found (describe specific impairments) aerobic capacity/endurance;gait, locomotion, and balance;motor function;muscle performance;neuromuscular;posture;ROM (range of motion)   Functional Limitations in Following  self-care;home management   Disability: Inability to Perform community/leisure   Rehab Potential good   Therapy Frequency 1x/day;minimum of 2x/week   Predicted Duration of Therapy Intervention (days/wks) until discharge   Anticipated Equipment Needs at Discharge (PT) none anticipated   Anticipated Discharge Disposition home with assist   Evaluation Complexity Justification   Patient History: Co-morbidity/factors that impact Plan of Care 1-2 that impact Plan of Care    Examination Components 4 or more Exam elements addressed   Presentation Evolving: Symptoms, complaints, characteristics of condition changing &/or cognitive deficits present   Clinical Decision Making Moderate complexity   Evaluation Complexity Moderate complexity   Care Plan Goals   PT Rehab Goals Bed Mobility Goal;Gait Training Goal;Stairs Training Goal;Transfer Training Goal   Bed Mobility Goal   Bed Mobility Goal, Date Established 01/18/22   Bed Mobility Goal, Time to Achieve by discharge   Bed Mobility Goal, Activity Type all bed mobility activities   Bed Mobility Goal, Independence Level modified independence   Gait Training  Goal, Distance to Achieve   Gait Training  Goal, Date Established 01/18/22   Gait Training  Goal, Time to Achieve by discharge   Gait Training  Goal, Independence Level modified independence   Gait Training  Goal, Assist Device least restricted assistive device   Gait Training  Goal, Distance to Achieve 250 ft   Stairs Training Goal   Stairs Training Goal, Date Established 01/18/22   Stairs Training Goal, Time to Achieve by discharge   Stairs Training Goal, Independence Level modified independence   Stairs Training Goal, Assist Device least restrictive assistive device   Stairs Training Goal, Number of Stairs to Achieve 5   Transfer Training Goal  Transfer Training Goal, Date Established 01/18/22   Transfer Training Goal, Time to Achieve by discharge   Transfer Training Goal, Activity Type bed-to-chair/chair-to-bed;sit-to-stand/stand-to-sit   Transfer Training Goal, Independence Level modified independence   Transfer Training Goal, Assist Device least restrictrictive assistive device   Planned Therapy Interventions, PT Eval   Planned Therapy Interventions (PT) balance training;bed mobility training;gait training;home exercise program;patient/family education;postural re-education;ROM (range of motion);stair training;strengthening;stretching;transfer training   [place] Relationship/Rapport care explained       Therapist:   Rexford Maus, PT 10:54 01/18/22  Pager #: [phone]

## 2022-01-18 NOTE — Care Plan (Signed)
[place]  Occupational Therapy Initial Evaluation    Patient Name: [name] [name]  Date of Birth:   Height: Height: 158.8 cm (5' 2.5")  Weight: Weight: 74.1 kg (163 lb 5.8 oz)  Room/Bed: 16/A  Payor: [place] MEDICARE / Plan: [place] MEDICARE ADVANTAGE PPO / Product Type: PPO /     Assessment:   Patient tolerated OT evaluation well this date. Patient presents with decreased endurance from baseline, however patient is able to complete basic self care tasks with SBA and is able to ambulate greater than household distances with SBA. Patient is expected to be able to return home with assist when medically appropriate.      Discharge Needs:   Equipment Recommendation: none anticipated    Discharge Disposition: home with assist    JUSTIFICATION OF DISCHARGE RECOMMENDATION   Based on current diagnosis, functional performance prior to admission, and current functional performance, this patient requires continued OT services in home with assist  in order to achieve significant functional improvements.    Plan:   Current Intervention: ADL retraining, balance training, endurance training, strengthening, therapeutic exercise, transfer training    To provide Occupational therapy services 1x/day, minimum of 2x/week, until discharge.       The risks/benefits of therapy have been discussed with the patient/caregiver and he/she is in agreement with the established plan of care.       Subjective & Objective        01/18/22 1055   Therapist Pager   OT Assigned/ Pager # [phone]   Rehab Session   Document Type evaluation   Total OT Minutes: 15   Patient Effort good   Symptoms Noted During/After Treatment fatigue   General Information   Patient Profile Reviewed yes   Onset of Illness/Injury or Date of Surgery 01/17/22   Pertinent History of Current Functional Problem Briefly, she is a very pleasant 81 year old female with prior medical history of coronary artery disease status post PCI to LCX x2,  essential hypertension mixed dyslipidemia and type 2 diabetes mellitus.  She had been having ongoing symptoms of anginal chest pain, and underwent a cardiac catheterization in August of 2022 showing 60% ostial LAD lesion, large LCX with patent proximal stent, distal 40% nonobstructive lesion, small patent RCA.  At that time it was recommended to optimize antianginal therapy with plans to consider IVUS of the LAD and CABG if symptoms persist despite adjustment in medical therapy.  Recently she has been having worsening symptoms of chest pain, lasting for about 40 minutes to 1 hour.  In the last 2 days she reports 3 episodes of chest pain which she describes as retrosternal chest heaviness with radiation to the left arm and jaw, with relief with sublingual nitroglycerin.  Given worsening episodes of chest pain, with known underlying LAD disease, she was referred to [facility] hospital further management.  She is currently chest pain free, vital signs stable and denies any active cardiac complaints.   Medical Lines PIV Line;Telemetry   Respiratory Status room air   Existing Precautions/Restrictions fall precautions;full code   Pre Treatment Status   Pre Treatment Patient Status Patient supine in bed;Call light within reach;Telephone within reach;Sitter select activated   Support Present Pre Treatment  Family present   Communication Pre Treatment  Nurse   Mutuality/Individual Preferences   Individualized Care Needs OOB with CG Ax1   Living Environment   Lives With spouse   Living Arrangements house   Home Accessibility stairs to enter home  Home Main Entrance   Number of Stairs, Main Entrance five   Functional Level Prior   Ambulation 0 - independent   Transferring 0 - independent   Toileting 0 - independent   Bathing 0 - independent   Dressing 0 - independent   Eating 0 - independent   Self-Care   Equipment Currently Used at Home yes   Equipment Currently Used at Home cane, straight   Activity/Exercise/Self-Care Comment Has  a cane for occasional knee pain   Vital Signs   Pre-Treatment Heart Rate (beats/min) 57   Post-treatment Heart Rate (beats/min) 74   Pre Treatment BP 124/52   Post Treatment BP 165/54   O2 Delivery Pre Treatment room air   Post SpO2 (%) 96   O2 Delivery Post Treatment room air   Vitals Comment VSS   Pain Assessment   Pretreatment Pain Rating 0/10 - no pain   Posttreatment Pain Rating 0/10 - no pain   Coping/Psychosocial   Observed Emotional State calm;cooperative   Coping/Psychosocial Response Interventions   Plan Of Care Reviewed With patient   Cognitive Assessment/Interventions   Behavior/Mood Observations alert;cooperative   Orientation Status oriented x 4   Attention WNL/WFL   Follows Commands WNL   RUE Assessment   RUE Assessment WFL- Within Functional Limits   LUE Assessment   LUE Assessment WFL- Within Functional Limits   Mobility Assessment/Training   Mobility Comment Patient transitioned from supine to sitting EOB with SBA. Patient completed STS with SBA and ambulated to bathroom and transferred to toilet with SBA. Following toileting and hand hygiene, patient able to ambulate ~150' with SBA.  Patient returned to room and transitioned to supine in bed with SBA.   Bed Mobility Assessment/Treatment   Supine-Sit Independence stand-by assistance   Sit to Supine, Independence stand-by assistance   Transfer Assessment/Treatment   Sit-Stand Independence stand-by assistance   Stand-Sit Independence stand-by assistance   Transfer Impairments balance impaired;endurance;coordination impaired;flexibility decreased;motor control impaired;postural control impaired;ROM decreased;strength decreased   Toileting Assessment/Training   Position sitting   Independence Level  independent   Grooming Assessment/Training   Position standing   Independence Level independent   Balance Skill Training   Comment with SBA   Sitting Balance: Static good balance   Sitting, Dynamic (Balance) good balance   Sit-to-Stand Balance fair +  balance   Standing Balance: Static fair + balance   Standing Balance: Dynamic fair + balance   Post Treatment Status   Post Treatment Patient Status Patient supine in bed;Call light within reach;Telephone within reach;Engineer, petroleum Nurse   Care Plan Goals   OT Rehab Goals Transfer Training Goal 2;Toileting Goal;LB Dressing Goal;Grooming Goal   Grooming Goal   Grooming Goal, Date Established 01/18/22   Grooming Goal, Time to Achieve by discharge   Grooming Goal, Activity Type all grooming tasks   Grooming Goal, Independence  independent   Grooming Goal, Position standing   LB Dressing Goal   LB Dressing Goal, Date Established 01/18/22   LB Dressing Goal, Time to Achieve by discharge   LB Dressing Goal, Activity Type all lower body dressing tasks   LB Dressing Goal, Independence Level modified independence   Toileting Goal   Toileting Goal, Date Established 01/18/22   Toileting Goal, Time to Achieve by discharge   Toileting Goal, Activity Type all toileting tasks   Toileting Goal, Independence Level independent   Transfer Training Goal 2   Transfer  Training Goal, Date Established 01/18/22   Transfer Training Goal, Time to Achieve by discharge   Transfer Training Goal, Activity Type all transfers   Transfer Training Goal, Independence Level independent   Planned Therapy Interventions, OT Eval   Planned Therapy Interventions ADL retraining;balance training;endurance training;strengthening;therapeutic exercise;transfer training   Functional Impairment   Overall Functional Impairments/Problem List balance impaired;coordination impaired;endurance   Clinical Impression   Functional Level at Time of Session Patient tolerated OT evaluation well this date. Patient presents with decreased endurance from baseline, however patient is able to complete basic self care tasks with SBA and is able to ambulate greater than household distances with SBA.  Patient is expected to be able to return home with assist when medically appropriate.   Criteria for Skilled Therapeutic Interventions Met (OT) yes;skilled treatment is necessary   Rehab Potential good   Therapy Frequency 1x/day;minimum of 2x/week   Predicted Duration of Therapy until discharge   Anticipated Equipment Needs at Discharge none anticipated   Anticipated Discharge Disposition home with assist   Highest level of Mobility score   Exercise/Activity Level Performed 7- Walked 25 feet or more   Daily Activity AM-PAC/6-clicks Score   Toileting 4   Personal grooming 4   Evaluation Complexity Justification   Occupational Profile Review Expanded review   Performance Deficits 1-3 deficits   Clinical Decision Making Moderate analytic complexity   Evaluation Complexity Moderate       Therapist:   Trellis Moment, OT   Pager #: [phone]

## 2022-01-18 NOTE — ACP (Advance Care Planning) (Signed)
Does the Patient have an Advance Directive?: Yes, Patient Does Have Advance Directive for Healthcare Treatment  Type of Advance Directive Completed: Medical Power of Attorney  Copy of Advance Directives in Chart?: No, Copy Requested From Family  Name of MPOA or Healthcare Surrogate: Gladie Gravette (husband) and Bernell List @ [phone]  Phone Number of MPOA or Healthcare Surrogate: Bernell List @ [phone]  Patient Requests Assistance in Having Advance Directive Notarized.: Not Applicable    Prentiss Bells RN  Care Management Department  [email or web address]  [phone]  Ext [phone]

## 2022-01-18 NOTE — ED Nurses Note (Signed)
Report attempted. RN unavailable. Call back number left.

## 2022-01-18 NOTE — Nurses Notes (Addendum)
Patient complaining of 8/10 chest pain radiating under left breast and up to left jaw.  MD notified.  EKG and troponin ordered.  1 Nitro given with relief-pain now 4/10 and patient refused 2nd dose. MD at bedside.

## 2022-01-18 NOTE — Nurses Notes (Signed)
Patient complaining of chest pain 7/10 radiating under left breast to back.  EKG and troponin ordered.  Nitro given x 1 with relief-pain 4/10 and pt refused a 2nd dose.

## 2022-01-18 NOTE — Care Management Notes (Signed)
[facility]  Care Management Note    Patient Name: [name] [name]  Date of Birth:   Sex: female  Date/Time of Admission: 01/17/2022  9:01 PM  Room/Bed: 16/A  Payor: [place] MEDICARE / Plan: [place] MEDICARE ADVANTAGE PPO / Product Type: PPO /    LOS: 0 days   Primary Care Providers:  Harriette Bouillon, PA-C, PA-C (General)    Admitting Diagnosis:  Coronary artery disease [I25.10]    Assessment:      01/18/22 1100   Referral Information   Admission Type observation   Observation Form   Observation Form Given MOON form given   MOON form explained/reviewed with:  Patient   MOON form given to [place] form date of delivery 01/18/22   MOON form time of delivery [place] delivered MOON to patient's bedside with MSW Ice. Pt's spouse, [name] [name], was present and patient gave expressed consent to speak in front of spouse. Signed MOON placed in CM bin to be filed in patient's chart.    The patient will continue to be evaluated for developing discharge needs.     Case Manager: Lianne Cure  Phone: [phone]

## 2022-01-18 NOTE — Consults (Signed)
SAFE-T Protocol with C-SSRS ([name] Risk and Protective Factors) - Recent    Step 1: Identify Risk Factors                                                   C-SSRS Suicidal Ideation Severity Month   1) Wish to be dead  Have you wished you were dead or wished you could go to sleep and not wake up? no   2) Current suicidal thoughts  Have you actually had any thoughts of killing yourself? no     3) Suicidal thoughts w/ Method (w/no specific Plan or Intent or act)  Have you been thinking about how you might do this? no   4) Suicidal Intent without Specific Plan  Have you had these thoughts and had some intention of acting on them? no   5) Intent with Plan  Have you started to work out or worked out the details of how to kill yourself? Do you intend to carry out this plan? no   C-SSRS Suicidal Behavior: "Have you ever done anything, started to do anything, or prepared to do anything to end your life?"    Examples: Collected pills, obtained a gun, gave away valuables, wrote a will or suicide note, took out pills but didn't swallow any, held a gun but changed your mind or it was grabbed from your hand, went to the roof but didn't jump; or actually took pills, tried to shoot yourself, cut yourself, tried to hang yourself, etc.  If "YES" Was it within the past 3 months? Lifetime    yes    Past 3 Months    no   Activating Events:   None noted or reported    Treatment History:   None reported or noted in chart review    Other:  N/a   Clinical Status:     None reported or noted in chart review       Access to lethal methods: Ask specifically about presence or absence of a firearm in the home or ease of accessing:    no     Step 2: Identify Protective Factors (Protective factors may not counteract significant acute suicide risk factors)   Internal:     Identifies reasons for living External:     {Belief that suicide is immoral; high spirituality, Responsibility to family or others; living with family and Supportive social  network of family or friends     Step 3: Specific questioning about Thoughts, Plans, and Suicidal Intent - (see Step 1 for Ideation Severity and Behavior)   C-SSRS Suicidal Ideation Intensity (with respect to the most severe ideation 1-5 identified above) Month   Frequency  How many times have you had these thoughts?  0   Duration  When you have the thoughts how long do they last? 0   Controllability  Could/can you stop thinking about killing yourself or wanting to die if you want to? 0   Deterrents  Are there things - anyone or anything (e.g., family, religion, pain of death) - that stopped you from wanting to die or acting on thoughts of suicide? 0   Reasons for Ideation  What sort of reasons did you have for thinking about wanting to die or killing yourself?  Was it to end the pain or stop the way you were feeling (  in other words you couldn't go on living with this pain or how you were feeling) or was it to get attention, revenge or a reaction from others? Or both? 0   Total Score                                  0         Step 4: Guidelines to Determine Level of Risk and Develop Interventions to LOWER Risk Level  "The estimation of suicide risk, at the culmination of the suicide assessment, is the quintessential clinical judgment, since no study has identified one specific risk factor or set of risk factors as specifically predictive of suicide or other suicidal behavior."   From The American Psychiatric Association Practice Guidelines for the Assessment and Treatment of Patients with Suicidal Behaviors, page 24.   RISK STRATIFICATION TRIAGE   High Suicide Risk  Suicidal ideation with intent or intent with plan in past month (C-SSRS Suicidal Ideation #4 or #5)  Or  Suicidal behavior within past 3 months (C-SSRS Suicidal Behavior)   Initiate local psychiatric admission process  Stay with patient until transfer to higher level of care is complete  Follow-up and document outcome of emergency psychiatric  evaluation     Moderate Suicide Risk  Suicidal ideation with method, WITHOUT plan, intent or behavior       in past month (C-SSRS Suicidal Ideation #3)  Or  Suicidal behavior more than 3 months ago (C-SSRS Suicidal Behavior Lifetime)  Or  Multiple risk factors and few protective factors Directly address suicide risk, implementing suicide     prevention strategies  Develop Safety Plan     Low Suicide Risk  Wish to die or Suicidal Ideation WITHOUT method, intent, plan or behavior (C-SSRS Suicidal Ideation #1 or #2)   Or  Modifiable risk factors and strong protective factors  Or  No reported history of Suicidal Ideation or Behavior  Discretionary Outpatient Referral     Step 5: Documentation   Risk Level :  Low Suicide Risk:  Discretionary Outpatient Referral     Clinical Note:  Your Clinical Observation - Therapist met with [name] [name]  to complete the SAFE-T Protocol with C-SSRS due to their ASQ screening. Zoa Dowty  reported that she is doing well. Geniece denied SI.   Relevant Mental Status Information - Neera Teng appeared alert, cooperative and talkative with therapist.  Methods of Suicide Risk Evaluation - Therapist observed and interviewed patient with the SAFE-T Protocol with C-SSRS.  They scored a 0 on the scale indicating a Low Suicide Risk.  Therapist also reviewed their chart and ASQ assessment. Spoke with their nurse      Brief Evaluation Summary  SAFE-T (Suicide Risk Assessment/Intervention)  Risk Factors:  Loss of physical health  Protective Factors:  : sense of responsibility to loved ones, religious beliefs, resilient - ability to cope with stress, frustration tolerance, social supports, positive therapeutic relationships, no access to firearms, sobriety, expressive of needs/emotions, good impulse control, insight into illness/situation, treatment compliance, motivated for treatment, positive educational/vocational history, resourceful, stable living environment  Suicide Inquiry:    Thoughts: none    Plans/Behaviors/Past attempts: None (hx of a past attempt)  Intent:None  Based on assessment, the patient is a low risk for suicide.  Access to Lethal Means - Denied  Collateral Sources Used and Relevant Information Obtained - Chart reviewed, observation of behaviors and motivational interview when assessing  [name] [name]   with the SAFE-T Protocol with C-SSRS. Spoke with patient's nurse.  Specific Assessment Data to Support Risk Determination - [name] [name] scored a 0 on the scale which means they are a Low Suicide Risk.  Therapist also reviewed their chart and ASQ assessment.   Rationale for Actions Taken and Not Taken - Kierstyn Baranowski  is low suicidal risk, they do  not have any thoughts, plans or intent to hurt themselves.  Their score of 0 on this assessment does match the way they feels at this time. Accalia Rigdon  is a Low Suicide Risk so they does not need CV or CVT monitoring. [name] [name] agreed to let staff know if they start to feel unsafe in any way.     Hollice Gong, LGSW, LGSW, 01/18/2022, 11:32    General Psychiatry, pager [phone]     Provision of Crisis Line 1-800-273-TALK(8255)  Implementation of Safety Plan (If Applicable)

## 2022-01-18 NOTE — Care Management Notes (Signed)
[facility] Management Initial Evaluation    Patient Name: [name] [name]  Date of Birth: 09/04/1941  Sex: female  Date/Time of Admission: 01/17/2022  9:01 PM  Room/Bed: 16/A  Payor: [place] MEDICARE / Plan: [place] MEDICARE ADVANTAGE PPO / Product Type: PPO /   Primary Care Providers:  Bernadene Bell, PA-C (General)    Pharmacy Info:   Preferred Pharmacy       [id number] [id number]   Phone: [phone]: [phone]   Hours: Not open 24 hours    [place]   [address] [place]   Phone: [phone]: [phone]   Hours: Monday-Friday 8AM-6PM, Saturday & Sunday Closed          Emergency Contact Info:   Extended Emergency Contact Information  Primary Emergency Contact: MCatronPhone: [phone] Mobile Phone: [phone] Relation: Husband    History:   CKolbee Stallmanis a 81y.o., female, admitted with Coronary artery disease     Height/Weight: 158.8 cm (5' 2.5") / 74.1 kg (163 lb 5.8 oz)     LOS: 0 days   Admitting Diagnosis: Coronary artery disease [I25.10]    Assessment:     [facility]cent[facility]. Patients LOS is 0 days. CColumbianacompleted chart review/rounds with service on 01/18/2022 and the patient is not medically stable for discharge at this time. Monitoring labs and VS, TTE pending, NPO, heparin gtt,     Therapy eval pending    Discharge Plan:  Home (Patient/Family Member/other) (code 1)    CCC went to the bedside 01/18/22 and met with CSilvano Biliswho is a 81y.o. female. Her husband MKerin Ransomwas also at bedside and we discussed discharge planning and reviewed the face sheet.  All the information on the face sheet was verified. Patient lives at home with her husband MKerin [name] Patient was independent with ADL's and mobility prior to this admission. Patient does not have any HH\SNF\IPR in the past.  She has a cane and uses it sometimes. No Dialysis. CEstelita Itenhas prescription coverage and manages own medications. Patient has a PCP, AHarriette Bouillon PA-C, and has had a recent visit. Transportation home with her husband MKerin [name]     [facility] with no needs. The patient will continue to be evaluated for developing discharge needs.     Case Manager: MReggy [name] CASE MANAGER  Phone: [phone]

## 2022-01-18 NOTE — Progress Notes (Signed)
[facility]  Cardiology   Progress Note      Jaylianna, Picklesimer  Date of Admission:  01/17/2022  Date of service: 01/18/2022  Date of Birth:    MRN:  [id number]    Hospital Day:  LOS: 0 days   Subjective: This morning she reports feeling well however is having mild left sided chest pain that radiates around to her back. She denies any SOB, nausea, vomiting, and lightheadedness.     Objective:     Vital Signs:  Temp (24hrs) Max:36.6 C (97.9 F)      Temperature: 36.5 C (97.7 F) (01/18/22 1100)  BP (Non-Invasive): (!) 165/54 (01/18/22 1100)  MAP (Non-Invasive): 86 mmHG (01/18/22 1100)  Heart Rate: 61 (01/18/22 1100)  Respiratory Rate: 16 (01/18/22 1100)  SpO2: 95 % (01/18/22 1100)  Physical Exam:  General: Alert and Oriented x3, appears stated age, no acute cardiopulmonary distress   Cardiovascular: RRR, S1/S2 audible  Lungs: CTA b/l, no rales, rhonchi, or wheezing   Abd: soft, non tender and non distended, no guarding or rebound, normal bowel sounds  Ext: no clubbing, cyanosis, or edema  Neuro: CN's intact, no focal deficits  Skin: Warm and dry, no rashes  Psych: Normal mood and affect    I/O:  I/O last 24 hours:      Intake/Output Summary (Last 24 hours) at 01/18/2022 1340  Last data filed at 01/18/2022 1023  Gross per 24 hour   Intake 113.44 ml   Output --   Net 113.44 ml     I/O current shift:  02/16 0700 - 02/16 1859  In: 100   Out: -   Blood Sugars: Last Fingerstick:  No results found for: GLUCOSEPOC    Labs:  Lab Results Today:    Results for orders placed or performed during the hospital encounter of 01/17/22 (from the past 24 hour(s))   ECG 12-LEAD   Result Value Ref Range    Ventricular rate 61 BPM    Atrial Rate 61 BPM    PR Interval 160 ms    QRS Duration 98 ms    QT Interval 456 ms    QTC Calculation 459 ms    Calculated P Axis 63 degrees    Calculated R Axis 28 degrees    Calculated T Axis 60 degrees   BASIC METABOLIC PANEL   Result Value Ref Range    SODIUM 130 (L) 136 - 145 mmol/L     POTASSIUM 4.0 3.5 - 5.1 mmol/L    CHLORIDE 96 96 - 111 mmol/L    CO2 TOTAL 23 23 - 31 mmol/L    ANION GAP 11 4 - 13 mmol/L    CALCIUM 10.0 8.8 - 10.2 mg/dL    GLUCOSE 150 (H) 65 - 125 mg/dL    BUN 21 8 - 25 mg/dL    CREATININE 1.35 (H) 0.60 - 1.05 mg/dL    BUN/CREA RATIO 16 6 - 22    ESTIMATED GFR 39 (L) >=60 mL/min/BSA   TROPONIN-I (FOR ED ONLY)   Result Value Ref Range    TROPONIN I <7 0 - 30 ng/L   CBC WITH DIFF   Result Value Ref Range    WBC 7.9 3.7 - 11.0 x10^3/uL    RBC 3.83 (L) 3.85 - 5.22 x10^6/uL    HGB 11.5 11.5 - 16.0 g/dL    HCT 34.5 (L) 34.8 - 46.0 %    MCV 90.1 78.0 - 100.0 fL    MCH 30.0 26.0 -  32.0 pg    MCHC 33.3 31.0 - 35.5 g/dL    RDW-CV 12.8 11.5 - 15.5 %    PLATELETS 300 150 - 400 x10^3/uL    MPV 9.4 8.7 - 12.5 fL    NEUTROPHIL % 49 %    LYMPHOCYTE % 39 %    MONOCYTE % 9 %    EOSINOPHIL % 2 %    BASOPHIL % 1 %    NEUTROPHIL # 3.89 1.50 - 7.70 x10^3/uL    LYMPHOCYTE # 3.07 1.00 - 4.80 x10^3/uL    MONOCYTE # 0.68 0.20 - 1.10 x10^3/uL    EOSINOPHIL # 0.18 <=0.50 x10^3/uL    BASOPHIL # <0.10 <=0.20 x10^3/uL    IMMATURE GRANULOCYTE % 0 0 - 1 %    IMMATURE GRANULOCYTE # <0.10 <0.10 x10^3/uL   HGA1C (HEMOGLOBIN A1C WITH EST AVG GLUCOSE)   Result Value Ref Range    HEMOGLOBIN A1C 6.1 (H) 4.0 - 5.6 %    ESTIMATED AVERAGE GLUCOSE 128 mg/dL   PT/INR   Result Value Ref Range    PROTHROMBIN TIME 11.2 9.1 - 13.9 seconds    INR 0.97 0.80 - 1.20   PTT (PARTIAL THROMBOPLASTIN TIME)   Result Value Ref Range    APTT 27.8 24.2 - 37.5 seconds   BASIC METABOLIC PANEL   Result Value Ref Range    SODIUM 136 136 - 145 mmol/L    POTASSIUM 4.2 3.5 - 5.1 mmol/L    CHLORIDE 100 96 - 111 mmol/L    CO2 TOTAL 23 23 - 31 mmol/L    ANION GAP 13 4 - 13 mmol/L    CALCIUM 9.8 8.8 - 10.2 mg/dL    GLUCOSE 117 65 - 125 mg/dL    BUN 19 8 - 25 mg/dL    CREATININE 1.06 (H) 0.60 - 1.05 mg/dL    BUN/CREA RATIO 18 6 - 22    ESTIMATED GFR 53 (L) >=60 mL/min/BSA   PHOSPHORUS   Result Value Ref Range    PHOSPHORUS 3.0 2.3 - 4.0 mg/dL    MAGNESIUM   Result Value Ref Range    MAGNESIUM 1.8 1.8 - 2.6 mg/dL   TROPONIN-I   Result Value Ref Range    TROPONIN I 194 (H) 0 - 30 ng/L   POC BLOOD GLUCOSE (RESULTS)   Result Value Ref Range    GLUCOSE, POC 111 (H) 70 - 105 mg/dl   PTT (PARTIAL THROMBOPLASTIN TIME)   Result Value Ref Range    APTT 43.0 (H) 24.2 - 37.5 seconds   POC BLOOD GLUCOSE (RESULTS)   Result Value Ref Range    GLUCOSE, POC 108 (H) 70 - 105 mg/dl   TROPONIN-I   Result Value Ref Range    TROPONIN I 10 0 - 30 ng/L       Imaging Studies:     1. Coronary angiography August 09, 2021 - Left ventricular end diastolic pressure was 17 mmHg. The left main is moderate in size and patent. The LAD is moderate in size, ostial LAD difficult to assess due to vessel overlap, there is a 60% ostial LAD lesion noted. The LCx is large and dominant with a patent proximal stent and a distal 40% non obstructive lesion The right coronary artery is small non dominant and patent.  2. TTE 09/2019 - Normal LVF, abnormal diastolic dysfunction    Current Medications:  acetaminophen (TYLENOL) tablet, 650 mg, Oral, Q4H PRN  [START ON 01/19/2022] amLODIPine (NORVASC) tablet, 5 mg, Oral, Daily  aspirin chewable tablet 81 mg, 81 mg, Oral, Daily  D5W 250 mL flush bag, , Intravenous, Q15 Min PRN  ezetimibe (ZETIA) tablet, 10 mg, Oral, QPM  heparin 25,000 units in NS 250 mL infusion, 12 Units/kg/hr (Adjusted), Intravenous, Continuous  hydroCHLOROthiazide (HYDRODIURIL) tablet, 12.5 mg, Oral, Daily  isosorbide mononitrate (IMDUR) 24 hr extended release tablet, 120 mg, Oral, QAM  levothyroxine (SYNTHROID) tablet, 100 mcg, Oral, QAM  metoprolol tartrate (LOPRESSOR) tablet 75 mg, 75 mg, Oral, 2x/day  nitroGLYCERIN (NITROSTAT) sublingual tablet, 0.4 mg, Sublingual, Q5 Min PRN  NS 250 mL flush bag, , Intravenous, Q15 Min PRN  NS flush syringe, 2-6 mL, Intracatheter, Q8HRS  NS flush syringe, 2-6 mL, Intracatheter, Q1 MIN PRN  pantoprazole (PROTONIX) delayed release tablet, 40 mg,  Oral, Daily  perflutren lipid microspheres (DEFINITY) 1.3 mL in NS 10 mL (tot vol) injection, 2 mL, Intravenous, Give in Cardiology  SSIP insulin lispro 100 units/mL injection, 0-12 Units, Subcutaneous, 4x/day PRN        Allergies   Allergen Reactions   . Pneumococcal 23-Valent Polysaccharide Vaccine  Other Adverse Reaction (Add comment)     Severe local swelling   . Tramadol Nausea/ Vomiting     "makes me sick with or without food"   . Iohexol  Other Adverse Reaction (Add comment) and Hives/ Urticaria      Desc: HIVES SEVERAL HRS S/P IV CONTRAST.. [place] LAST SCAN W/ BENADRYL PREMED @ [facility] '01/AC.   Marland Kitchen Lisinopril  Other Adverse Reaction (Add comment)     Other reaction(s): Cough   . Other Rash     Other reaction(s): Other (See Comments)  Nickle   When teeth were wired in she place, patient obtained an infection and wiring had to be removed,  Also allergic to silver   . Crestor [Rosuvastatin]      Muscle aches   . Lovastatin    . Statin [Atorvastatin]      Muscle ache   . Iv Contrast Hives/ Urticaria   . Nickel Hives/ Urticaria         Assessment/ Plan:  Active Hospital Problems    Diagnosis   . Primary Problem: Coronary artery disease       81 year old female with prior medical history of coronary artery disease status post PCI to LCX x2, essential hypertension mixed dyslipidemia and type 2 diabetes mellitus admitted for known lesions with unstable angina.        Unstable Angina  CAD s/p PCI to LCx x2  Hypertension  HLD   -Troponins negative on arrival to [facility], peaked at 194  -Patient case reviewed with cardiac surgery, no indication for surgical intervention   -Interventional cardiology to review images to decide on repeating LHC/coronary angiogram to re-evaluate LAD lesion  -Will need to optimize GDMT and increase anti-anginal medications    -Increase home Amlodipine to 5 mg and Lopressor to 75 mg bid    -Continue home Zetia, HCTZ, Imdur   -Holding home Plavix and ARB currently   -TTE pending   -Plan for LHC tomorrow,  NPO at midnight     Hypothyroidism   -Continue home Synthroid     T2DM   -A1C pending   -Holding home tradjenta   -SSI protocol     DVT/PE ppx: heparin   Activity: as tolerated    Status: step down   Diet: NPO   PT/OT: following   Dispo: tbd     Jeanella Cara, MD

## 2022-01-18 NOTE — H&P (Signed)
[facility]  Cardiology Admission  History and Physical      [name] [name], [name] [name], 81 y.o. female  Date of Admission:  01/17/2022  Date of Birth:    PCP:  Marko Stai, PA-C    Chief Complaint:  Chest pain    HPI:  Briefly, she is a very pleasant 81 year old female with prior medical history of coronary artery disease status post PCI to LCX x2, essential hypertension mixed dyslipidemia and type 2 diabetes mellitus.  She had been having ongoing symptoms of anginal chest pain, and underwent a cardiac catheterization in August of 2022 showing 60% ostial LAD lesion, large LCX with patent proximal stent, distal 40% nonobstructive lesion, small patent RCA.  At that time it was recommended to optimize antianginal therapy with plans to consider IVUS of the LAD and CABG if symptoms persist despite adjustment in medical therapy.  Recently she has been having worsening symptoms of chest pain, lasting for about 40 minutes to 1 hour.  In the last 2 days she reports 3 episodes of chest pain which she describes as retrosternal chest heaviness with radiation to the left arm and jaw, with relief with sublingual nitroglycerin.  Given worsening episodes of chest pain, with known underlying LAD disease, she was referred to [facility] further management.  She is currently chest pain free, vital signs stable and denies any active cardiac complaints.    Home cardiac medications -   Amlodipine 2.5 mg daily   Aspirin 81 mg daily   Plavix 75 mg daily  Repatha 140 mg every 14 days   Irbesartan 300 mg daily   Hydrochlorothiazide 12.5 mg daily  Imdur 120 mg daily     Cardiac Risk Factors:  As above    Admission Medications:  D5W 250 mL flush bag, , Intravenous, Q15 Min PRN  heparin 25,000 units in NS 250 mL infusion, 12 Units/kg/hr (Adjusted), Intravenous, Continuous  NS 250 mL flush bag, , Intravenous, Q15 Min PRN  NS flush syringe, 2-6 mL, Intracatheter, Q8HRS  NS flush syringe, 2-6 mL, Intracatheter, Q1 MIN PRN      Past  Medical History:   Diagnosis Date   . Coronary artery disease    . Dyspnea on exertion    . Essential hypertension    . Hyperlipidemia    . PONV (postoperative nausea and vomiting)    . Stented coronary artery    . Type 2 diabetes mellitus (CMS HCC)        Past Surgical History:   Procedure Laterality Date   . CARDIAC CATHETERIZATION     . CATARACT EXTRACTION, BILATERAL Bilateral    . HX COLONOSCOPY     . HX HEART CATHETERIZATION  2017 and 2018    Stented twice  prox circ  and circ   . HX PARTIAL HYSTERECTOMY      tubes and ovaries left intact   . HX THYROIDECTOMY  1978   . HX WISDOM TEETH EXTRACTION     . KNEE SURGERY  2018    muscle removed   . LAPAROSCOPIC CHOLECYSTECTOMY     . RECONSTRUCTION OF NOSE  1980       Allergies:  Allergies   Allergen Reactions   . Pneumococcal 23-Valent Polysaccharide Vaccine  Other Adverse Reaction (Add comment)     Severe local swelling   . Tramadol Nausea/ Vomiting     "makes me sick with or without food"   . Iohexol  Other Adverse Reaction (Add comment) and Hives/ Urticaria  Desc: HIVES SEVERAL HRS S/P IV CONTRAST.. [place] LAST SCAN W/ BENADRYL PREMED @ [facility] '01/AC.   Marland Kitchen Lisinopril  Other Adverse Reaction (Add comment)     Other reaction(s): Cough   . Other Rash     Other reaction(s): Other (See Comments)  Nickle   When teeth were wired in she place, patient obtained an infection and wiring had to be removed,  Also allergic to silver   . Crestor [Rosuvastatin]      Muscle aches   . Lovastatin    . Statin [Atorvastatin]      Muscle ache   . Iv Contrast Hives/ Urticaria   . Nickel Hives/ Urticaria     Dye:  No  Iodine:  No    Family History:  Family Medical History:     Problem Relation (Age of Onset)    Cardiomyopathy Father    Heart Attack Mother    Heart Disease Sister    Uterine Cancer Mother, Niece          Social History:  Social History     Tobacco Use   . Smoking status: Former     Packs/day: 0.50     Years: 10.00     Pack years: 5.00     Types: Cigarettes   . Smokeless tobacco:  Never   Substance Use Topics   . Alcohol use: Not Currently     Alcohol/week: 1.0 standard drink     Types: 1 Cans of beer per week     Comment: drank long time ago not current.        Review of Systems:  Negative unless stated above in HPI    Physical Examination:   Vitals:    01/18/22 0230 01/18/22 0305 01/18/22 0340 01/18/22 0356   BP: 138/61 (!) 170/68 (!) 166/53    Pulse: 66 76 68    Resp: 19  18    Temp:   36.5 C (97.7 F)    SpO2: 95% 96% 97%    Weight:   74.1 kg (163 lb 5.8 oz)    Height:    1.588 m (5' 2.5")   BMI:         General: Well appearing, well nourished, in no distress. Oriented x 3, normal mood and affect .  HEENT:Normocephalic, atraumatic, conjunctiva clear, sclera non-icteric  Neck: Supple, without lesions, bruits, or adenopathy No JVD, No lower extremity edema  Heart: No cardiomegaly or thrills; regular rate and rhythm, no murmur or gallop  Lungs: Clear to auscultation and percussion  Abdomen: Bowel sounds normal, no tenderness, organomegaly, masses, or hernia      LABS:    CBC Differential   Recent Labs     01/17/22  2011   WBC 7.9   HGB 11.5   HCT 34.5*   PLTCNT 300    Recent Labs     01/17/22  2011   PMNS 49   MONOCYTES 9   BASOPHILS 1  <0.10   PMNABS 3.89   LYMPHSABS 3.07   MONOSABS 0.68   EOSABS 0.18      BMP LFTs   Recent Labs     01/17/22  2011   SODIUM 130*   POTASSIUM 4.0   CHLORIDE 96   CO2 23   BUN 21   CREATININE 1.35*   GLUCOSENF 150*   ANIONGAP 11   BUNCRRATIO 16   GFR 39*   CALCIUM 10.0    No results found for this encounter   CoAgs  Blood Gas:   Recent Labs     01/17/22  2140   PROTHROMTME 11.2   INR 0.97   APTT 27.8    No results found for this encounter    Cardiac Markers Lipid Panel   Recent Labs     01/17/22  2011   TROPONINI <7    No results found for this encounter     Patient has decision making capacity:  Yes    Code Status:  Full Code     Procedures -   Coronary angiography August 09, 2021 - Left ventricular end diastolic pressure was 17 mmHg. The left main is  moderate in size and patent. The LAD is moderate in size, ostial LAD difficult to assess due to vessel overlap, there is a 60% ostial LAD lesion noted. The LCx is large and dominant with a patent proximal stent and a distal 40% non obstructive lesion The right coronary artery is small non dominant and patent.  TTE 09/2019 - Normal LVF, abnormal diastolic dysfunction    Assessment/Plan:  Unstable angina  History of coronary artery disease status post PCI to LCX x2   Essential hypertension  Mixed dyslipidemia   Type 2 diabetes mellitus    Continue home aspirin, started heparin drip.  Hold Plavix.  Multidisciplinary meet tomorrow with interventional cardiology and cardiac surgery team for management plan for underlying LAD disease.  Continue other home antihypertensive and antianginal medications.  Unsure baseline renal function, holding home ARB.  Repeat TTE tomorrow.    DVT/PE Prophylaxis: Heparin    Daine Floras, MD  Fellow, Cardiovascular disease    Attending attestation:  Late entry for the date of service (see above).   Patient was seen and examined, plan of care was discussed with resident, note was reviewed and modified as detailed above.   Lottie Mussel, MD   Cardiology Attending  [facility]  Pager# [phone]  01/22/22

## 2022-01-18 NOTE — Nurses Notes (Signed)
Pt admitted from ED with chest pain for further workup.  Pt started on heparin gtt.  Denies chest pain and SOB.  Pt oriented to room and call light system. Fall precautions maintained, call light within reach.

## 2022-01-19 ENCOUNTER — Encounter (HOSPITAL_COMMUNITY): Payer: Self-pay | Admitting: Cardiovascular Disease

## 2022-01-19 ENCOUNTER — Encounter (HOSPITAL_COMMUNITY)
Admission: EM | Disposition: A | Discharge status: Home / Self Care | Payer: Self-pay | Source: Ambulatory Visit | Attending: Emergency Medicine

## 2022-01-19 DIAGNOSIS — R079 Chest pain, unspecified: Secondary | ICD-10-CM

## 2022-01-19 DIAGNOSIS — I110 Hypertensive heart disease with heart failure: Secondary | ICD-10-CM

## 2022-01-19 DIAGNOSIS — I5030 Unspecified diastolic (congestive) heart failure: Secondary | ICD-10-CM

## 2022-01-19 DIAGNOSIS — I272 Pulmonary hypertension, unspecified: Secondary | ICD-10-CM

## 2022-01-19 LAB — LIPID PANEL
CHOL/HDL RATIO: 3.4
CHOLESTEROL: 171 mg/dL (ref 100–200)
HDL CHOL: 51 mg/dL (ref >=50)
LDL CALC: 94 mg/dL (ref <100)
NON-HDL: 120 mg/dL (ref <=190)
TRIGLYCERIDES: 147 mg/dL (ref <150)
VLDL CALC: 24 mg/dL (ref <30)

## 2022-01-19 LAB — BASIC METABOLIC PANEL, FASTING
ANION GAP: 11 mmol/L (ref 4–13)
BUN/CREA RATIO: 18 (ref 6–22)
BUN: 19 mg/dL (ref 8–25)
CALCIUM: 9.5 mg/dL (ref 8.8–10.2)
CHLORIDE: 103 mmol/L (ref 96–111)
CO2 TOTAL: 20 mmol/L — ABNORMAL LOW (ref 23–31)
CREATININE: 1.05 mg/dL (ref 0.60–1.05)
ESTIMATED GFR: 53 mL/min/BSA — ABNORMAL LOW (ref >=60)
GLUCOSE: 111 mg/dL — ABNORMAL HIGH (ref 70–99)
POTASSIUM: 3.9 mmol/L (ref 3.5–5.1)
SODIUM: 134 mmol/L — ABNORMAL LOW (ref 136–145)

## 2022-01-19 LAB — CBC WITH DIFF
BASOPHIL #: <0.1 10*3/uL (ref <=0.20)
BASOPHIL %: 0 %
EOSINOPHIL #: 0.16 10*3/uL (ref <=0.50)
EOSINOPHIL %: 2 %
HCT: 35.7 % (ref 34.8–46.0)
HGB: 12.1 g/dL (ref 11.5–16.0)
IMMATURE GRANULOCYTE #: <0.1 10*3/uL (ref <0.10)
IMMATURE GRANULOCYTE %: 0 % (ref 0–1)
LYMPHOCYTE #: 2.33 10*3/uL (ref 1.00–4.80)
LYMPHOCYTE %: 32 %
MCH: 29.8 pg (ref 26.0–32.0)
MCHC: 33.9 g/dL (ref 31.0–35.5)
MCV: 87.9 fL (ref 78.0–100.0)
MONOCYTE #: 0.64 10*3/uL (ref 0.20–1.10)
MONOCYTE %: 9 %
MPV: 9.5 fL (ref 8.7–12.5)
NEUTROPHIL #: 4.12 10*3/uL (ref 1.50–7.70)
NEUTROPHIL %: 57 %
PLATELETS: 305 10*3/uL (ref 150–400)
RBC: 4.06 10*6/uL (ref 3.85–5.22)
RDW-CV: 13.1 % (ref 11.5–15.5)
WBC: 7.3 10*3/uL (ref 3.7–11.0)

## 2022-01-19 LAB — POC ACT CELITE (RESULTS)
ACT CELITE, POC: 294 seconds — ABNORMAL HIGH
ACT CELITE, POC: 298 seconds — ABNORMAL HIGH

## 2022-01-19 LAB — TYPE AND SCREEN
ABO/RH(D): A POS
ANTIBODY SCREEN: NEGATIVE

## 2022-01-19 LAB — POC BLOOD GLUCOSE (RESULTS)
GLUCOSE, POC: 100 mg/dl (ref 70–105)
GLUCOSE, POC: 95 mg/dl (ref 70–105)
GLUCOSE, POC: 163 mg/dl — ABNORMAL HIGH (ref 70–105)

## 2022-01-19 LAB — MAGNESIUM: MAGNESIUM: 2 mg/dL (ref 1.8–2.6)

## 2022-01-19 LAB — PTT (PARTIAL THROMBOPLASTIN TIME): APTT: 75.2 seconds — ABNORMAL HIGH (ref 24.2–37.5)

## 2022-01-19 LAB — PHOSPHORUS: PHOSPHORUS: 3.3 mg/dL (ref 2.3–4.0)

## 2022-01-19 SURGERY — CORONARY ANGIOGRAPHY W/LEFT HEART CATH W/WO LVG
Anesthesia: Moderate Sedation (Nurse Monitored)

## 2022-01-19 MED ORDER — HEPARIN (PORCINE) (PF) 1,000 UNIT/500 ML IN 0.9 % SODIUM CHLORIDE IV
INTRAVENOUS | Status: AC
Start: 2022-01-19 — End: 2022-01-19
  Filled 2022-01-19: qty 2000

## 2022-01-19 MED ORDER — FAMOTIDINE (PF) 20 MG/2 ML INTRAVENOUS SOLUTION
INTRAVENOUS | Status: AC
Start: 2022-01-19 — End: 2022-01-19
  Filled 2022-01-19: qty 2

## 2022-01-19 MED ORDER — FAMOTIDINE 20 MG TABLET
20.0000 mg | ORAL_TABLET | Freq: Two times a day (BID) | ORAL | Status: AC
Start: 2022-01-19 — End: 2022-01-20
  Administered 2022-01-19: 20 mg via ORAL
  Filled 2022-01-19: qty 1

## 2022-01-19 MED ORDER — MIDAZOLAM 1 MG/ML INJECTION SOLUTION
INTRAMUSCULAR | Status: AC
Start: 2022-01-19 — End: 2022-01-19
  Filled 2022-01-19: qty 2

## 2022-01-19 MED ORDER — SODIUM CHLORIDE 0.9 % INTRAVENOUS SOLUTION
INTRAVENOUS | Status: AC
Start: 2022-01-19 — End: 2022-01-19

## 2022-01-19 MED ORDER — ONDANSETRON HCL (PF) 4 MG/2 ML INJECTION SOLUTION
4.0000 mg | Freq: Three times a day (TID) | INTRAMUSCULAR | Status: DC | PRN
Start: 2022-01-19 — End: 2022-01-20
  Administered 2022-01-19: 4 mg via INTRAVENOUS
  Filled 2022-01-19: qty 2

## 2022-01-19 MED ORDER — DIPHENHYDRAMINE 50 MG/ML INJECTION SOLUTION
Freq: Once | INTRAMUSCULAR | Status: DC | PRN
Start: 2022-01-19 — End: 2022-01-19
  Administered 2022-01-19: 50 mg via INTRAVENOUS

## 2022-01-19 MED ORDER — NITROGLYCERIN 50 MCG/ML IV DILUTION - FOR ANES
INJECTION | Freq: Once | INTRAVENOUS | Status: DC | PRN
Start: 2022-01-19 — End: 2022-01-19
  Administered 2022-01-19: 200 ug via INTRACORONARY

## 2022-01-19 MED ORDER — HYDROCORTISONE SOD SUCCINATE 100 MG/2 ML VIAL WRAPPER
INTRAMUSCULAR | Status: AC
Start: 2022-01-19 — End: 2022-01-19
  Filled 2022-01-19: qty 2

## 2022-01-19 MED ORDER — LIDOCAINE HCL 20 MG/ML (2 %) INJECTION SOLUTION
Freq: Once | INTRAMUSCULAR | Status: DC | PRN
Start: 2022-01-19 — End: 2022-01-19
  Administered 2022-01-19: 2 mL via INTRADERMAL

## 2022-01-19 MED ORDER — DIPHENHYDRAMINE 50 MG/ML INJECTION SOLUTION
INTRAMUSCULAR | Status: AC
Start: 2022-01-19 — End: 2022-01-19
  Filled 2022-01-19: qty 1

## 2022-01-19 MED ORDER — DIPHENHYDRAMINE 50 MG/ML INJECTION SOLUTION
50.0000 mg | Freq: Once | INTRAMUSCULAR | Status: DC
Start: 2022-01-19 — End: 2022-01-20

## 2022-01-19 MED ORDER — IOPAMIDOL 300 MG IODINE/ML (61 %) INTRAVENOUS SOLUTION
Freq: Once | INTRAVENOUS | Status: DC | PRN
Start: 2022-01-19 — End: 2022-01-19
  Administered 2022-01-19: 90 mL via INTRAVENOUS

## 2022-01-19 MED ORDER — CLOPIDOGREL 300 MG TABLET
ORAL_TABLET | ORAL | Status: AC
Start: 2022-01-19 — End: 2022-01-19
  Filled 2022-01-19: qty 2

## 2022-01-19 MED ORDER — FENTANYL (PF) 50 MCG/ML INJECTION SOLUTION
INTRAMUSCULAR | Status: AC
Start: 2022-01-19 — End: 2022-01-19
  Filled 2022-01-19: qty 2

## 2022-01-19 MED ORDER — HEPARIN (PORCINE) 1,000 UNIT/ML INJECTION SOLUTION
Freq: Once | INTRAMUSCULAR | Status: DC | PRN
Start: 2022-01-19 — End: 2022-01-19
  Administered 2022-01-19: 2000 [IU] via INTRAVENOUS
  Administered 2022-01-19: 1000 [IU] via INTRAVENOUS
  Administered 2022-01-19: 4000 [IU] via INTRAVENOUS
  Administered 2022-01-19: 3000 [IU] via INTRA_ARTERIAL

## 2022-01-19 MED ORDER — CLOPIDOGREL 300 MG TABLET
ORAL_TABLET | Freq: Once | ORAL | Status: DC | PRN
Start: 2022-01-19 — End: 2022-01-19
  Administered 2022-01-19: 600 mg via ORAL

## 2022-01-19 MED ORDER — MIDAZOLAM (PF) 1 MG/ML INJECTION SOLUTION
Freq: Once | INTRAMUSCULAR | Status: DC | PRN
Start: 2022-01-19 — End: 2022-01-19
  Administered 2022-01-19 (×3): .5 mg via INTRAVENOUS

## 2022-01-19 MED ORDER — LIDOCAINE HCL 20 MG/ML (2 %) INJECTION SOLUTION
INTRAMUSCULAR | Status: AC
Start: 2022-01-19 — End: 2022-01-19
  Filled 2022-01-19: qty 20

## 2022-01-19 MED ORDER — IOPAMIDOL 300 MG IODINE/ML (61 %) INTRAVENOUS SOLUTION
INTRAVENOUS | Status: AC
Start: 2022-01-19 — End: 2022-01-19
  Filled 2022-01-19: qty 100

## 2022-01-19 MED ORDER — POTASSIUM CHLORIDE ER 20 MEQ TABLET,EXTENDED RELEASE(PART/CRYST)
40.0000 meq | ORAL_TABLET | ORAL | Status: AC
Start: 2022-01-19 — End: 2022-01-19
  Administered 2022-01-19: 40 meq via ORAL
  Filled 2022-01-19: qty 2

## 2022-01-19 MED ORDER — CLOPIDOGREL 75 MG TABLET
75.0000 mg | ORAL_TABLET | Freq: Every day | ORAL | Status: DC
Start: 2022-01-20 — End: 2022-01-20
  Administered 2022-01-20: 75 mg via ORAL
  Filled 2022-01-19: qty 1

## 2022-01-19 MED ORDER — METHYLPREDNISOLONE SOD SUCC 125 MG SOLUTION FOR INJECTION WRAPPER
50.0000 mg | INTRAVENOUS | Status: DC
Start: 2022-01-19 — End: 2022-01-20

## 2022-01-19 MED ORDER — HYDROCORTISONE SOD SUCCINATE 100 MG/2 ML VIAL WRAPPER
Freq: Once | INTRAMUSCULAR | Status: DC | PRN
Start: 2022-01-19 — End: 2022-01-19
  Administered 2022-01-19: 100 mg via INTRAVENOUS

## 2022-01-19 MED ORDER — CLOPIDOGREL 75 MG TABLET
75.0000 mg | ORAL_TABLET | Freq: Every day | ORAL | Status: DC
Start: 2022-01-20 — End: 2022-01-20

## 2022-01-19 MED ORDER — FAMOTIDINE (PF) 20 MG/2 ML INTRAVENOUS SOLUTION
Freq: Once | INTRAVENOUS | Status: DC | PRN
Start: 2022-01-19 — End: 2022-01-19
  Administered 2022-01-19: 20 mg via INTRAVENOUS

## 2022-01-19 MED ORDER — FENTANYL (PF) 50 MCG/ML INJECTION SOLUTION
Freq: Once | INTRAMUSCULAR | Status: DC | PRN
Start: 2022-01-19 — End: 2022-01-19
  Administered 2022-01-19 (×2): 25 ug via INTRAVENOUS

## 2022-01-19 MED ORDER — VERAPAMIL 2.5 MG/ML INTRAVENOUS SOLUTION
Freq: Once | INTRAVENOUS | Status: DC | PRN
Start: 2022-01-19 — End: 2022-01-19
  Administered 2022-01-19: 2.5 mg via INTRA_ARTERIAL

## 2022-01-19 MED ORDER — VERAPAMIL 2.5 MG/ML INTRAVENOUS SOLUTION
INTRAVENOUS | Status: AC
Start: 2022-01-19 — End: 2022-01-19
  Filled 2022-01-19: qty 2

## 2022-01-19 MED ORDER — HEPARIN (PORCINE) 1,000 UNIT/ML INJECTION SOLUTION
INTRAMUSCULAR | Status: AC
Start: 2022-01-19 — End: 2022-01-19
  Filled 2022-01-19: qty 10

## 2022-01-19 SURGICAL SUPPLY — 28 items
BALLOON 3MM 143CM 15MM WOLVERINE MNRL CUT BUMPER TIP MIDSHAFT RADOPQ ANGPLSTY ATH ZGLIDE (BALLOON) ×1 IMPLANT
CATH ANGIO 5FR FR3.5 CURVE 100CM EXPO WRE BRD RBST SHAFT FEM TRILON (CATHETERS) ×1 IMPLANT
CATH ANGIO 5FR FR3.5 CURVE 100_CM EXPO WRE BRD RBST SHFT FEM (CATHETERS) ×1
CATH EUPHORA 2.5MM 15MM 142CM RX TAPER SHAFT LOW PROF RADOPQ BAL DIL ULTRA-SLIM DURA-TRC LF  PTCA (BALLOON) ×1 IMPLANT
CATH EUPHORA 2.5MM 15MM 142CM_RX TPR SHFT LO PRFL RADOPQ (BALLOON) ×1
CATH GD 6FR 100CM EBU3 CURVE LNCHR LRG LUM RADOPQ FLXB DIST SEG COR NYL STRL LF (CATHETERS) ×1 IMPLANT
CATH GD 6FR 100CM EBU3 CURVE L_NCHR LRG LUM RADOPQ FLXB DIST (CATHETERS) ×1
CATH NC EUPHORA 3.5MM 15MM 142CM RX LOW PROF RADOPQ BAL DIL DURA-TRC NYL LF  PTCA (BALLOON) ×1 IMPLANT
CATH NC EUPHORA 3.5MM 15MM 142_CM RX LO PRFL RADOPQ BAL DIL (BALLOON) ×2
CATH US OPTICROSS 6HD 3.6FR 1.18MM 135CM 60MHZ IMG COR STRL LF  DISP (CATHETERS) ×1 IMPLANT
CATH US OPTICROSS HD 3.6FR_1.18MM 135CM IMG COR STRL LF (CATHETERS) ×1
CATH WOLVERINE CUT BAL MNRL 3M_M 15MM 143CM BMPR TIP MIDSHAFT (BALLOON) ×1
DEVICE INFLAT 12IN STD INFL Y ADPR BRD TUBE SYRG PLYCRB 20CU CM 20ML LF (CUSTOM TRAYS & PACK) ×1 IMPLANT
DEVICE US GLXY IVUS ILAB CATH PULBCK SLED STRL (CONTRAST) ×1 IMPLANT
DEVICE US GLXY IVUS ILAB CATH_PULBCK SLED MD5 STRL (CONTRAST) ×1
DISCONTINUED USE 338553 - PACK SURG ANGIO STRL DISP LTX (IV TUBING & ACCESSORIES) ×1 IMPLANT
ELECTRODE DEFIBR QCMBO 59-95F TEMP CONTROL ADULT (MED SURG SUPPLIES) ×1 IMPLANT
ELECTRODE DEFIBR RD 24IN EDGE_SYS QCMB LEADWIRE ADLT LP12 LF (MED SURG SUPPLIES) ×1
GW CORDIS EMRLD .035IN 7CM 260_CM STD FLX TP FIX COR PTFE VAS (WIRE) ×1
GW EMRLD .035IN 7CM 260CM FLXB END FIX COR EXCH PTFE VAS PERC 3MM RAD J CURVE DISP (WIRE) ×1 IMPLANT
GW RUNTHROUGH .36MM 3CM 180CM RADOPQ X FLPY VAS STRL LF  DISP PTCA (WIRE) ×1 IMPLANT
GW RUNTHROUGH NS .014IN 3CM 18_CM ATRM X FLPY TIP RADOPQ (WIRE) ×1
KIT ANGIO INFLATION DEVICE (CUSTOM TRAYS & PACK) ×1
KIT LEFT HEART MANIFOLD 4 PORT (VASCULAR) ×1
KIT SURG LFT HRT STRL DISP RUBY MEM LF (VASCULAR) ×1 IMPLANT
PACK CATHETERIZATION CUSTOM_5EA/CS (IV TUBING & ACCESSORIES) ×1
STENT ONYX FRNTR 3.5MM 15MM COR RX STRL LF (STENTS CORONARY) ×1 IMPLANT
Slender Glidesheath (CUSTOM TRAYS & PACK) ×2 IMPLANT

## 2022-01-19 NOTE — Care Management Notes (Signed)
[place] Management Note    Patient Name: [name] [name]  Date of Birth:   Sex: female  Date/Time of Admission: 01/17/2022  9:01 PM  Room/Bed: 16/A  Payor: [place]  MEDICARE / Plan: [place] MEDICARE ADVANTAGE PPO / Product Type: PPO /    LOS: 0 days   Primary Care Providers:  Harriette Bouillon, PA-C, PA-C (General)    Admitting Diagnosis:  Coronary artery disease [I25.10]    Assessment:      01/19/22 1724   Assessment Details   Assessment Type Continued Assessment   Date of Care Management Update 01/19/22   Date of Next DCP Update 01/22/22   Care Management Plan   Discharge Planning Status plan in progress   Projected Discharge Date 01/21/22   Discharge Needs Assessment   Discharge Facility/Level of Care Needs Home (Patient/Family Member/other)(code 1)     Per chart review, pt is not medically stable for d/c. Plan for LHC today.  Therapy recommendations for home with assist.    Discharge Plan:  Home (Patient/Family Member/other) (code 1)  Care Management will continue to follow and arrange as appropriate.    The patient will continue to be evaluated for developing discharge needs.     Case Manager: Noreene Larsson, MSW  Phone: [phone]

## 2022-01-19 NOTE — Progress Notes (Signed)
[facility]  Cardiology   Progress Note      Darenda, Baudoin  Date of Admission:  01/17/2022  Date of service: 01/19/2022  Date of Birth:    MRN:  [id number]    Hospital Day:  LOS: 0 days   Subjective:Patient is resting comfortably in bed. She denies any chest pain overnight. She states finally got to sleep well. She denies SOB, nausea, and vomiting.     Objective:     Vital Signs:  Temp (24hrs) Max:36.9 C (98.4 F)      Temperature: 36.9 C (98.4 F) (01/19/22 0357)  BP (Non-Invasive): (!) 159/54 (01/19/22 0357)  MAP (Non-Invasive): 85 mmHG (01/19/22 0357)  Heart Rate: 64 (01/19/22 0357)  Respiratory Rate: 16 (01/19/22 0357)  SpO2: 96 % (01/19/22 0357)  Physical Exam:  General: Alert and Oriented x3, appears stated age, no acute cardiopulmonary distress   Cardiovascular: RRR, S1/S2 audible  Lungs: CTA b/l, no rales, rhonchi, or wheezing   Abd: soft, non tender and non distended, no guarding or rebound, normal bowel sounds  Ext: no clubbing, cyanosis, or edema  Neuro: CN's intact, no focal deficits  Skin: Warm and dry, no rashes  Psych: Normal mood and affect    I/O:  I/O last 24 hours:      Intake/Output Summary (Last 24 hours) at 01/19/2022 0729  Last data filed at 01/18/2022 2300  Gross per 24 hour   Intake 600 ml   Output --   Net 600 ml     I/O current shift:  No intake/output data recorded.  Blood Sugars: Last Fingerstick:  No results found for: GLUCOSEPOC    Labs:  Lab Results Today:    Results for orders placed or performed during the hospital encounter of 01/17/22 (from the past 24 hour(s))   PTT (PARTIAL THROMBOPLASTIN TIME)   Result Value Ref Range    APTT 43.0 (H) 24.2 - 37.5 seconds   ECG 12-LEAD   Result Value Ref Range    Ventricular rate 78 BPM    Atrial Rate 78 BPM    PR Interval 152 ms    QRS Duration 96 ms    QT Interval 418 ms    QTC Calculation 476 ms    Calculated P Axis 53 degrees    Calculated R Axis 5 degrees    Calculated T Axis 48 degrees   POC BLOOD GLUCOSE (RESULTS)   Result  Value Ref Range    GLUCOSE, POC 108 (H) 70 - 105 mg/dl   TROPONIN-I   Result Value Ref Range    TROPONIN I 10 0 - 30 ng/L   ECG 12-LEAD   Result Value Ref Range    Ventricular rate 63 BPM    Atrial Rate 63 BPM    PR Interval 158 ms    QRS Duration 96 ms    QT Interval 484 ms    QTC Calculation 495 ms    Calculated P Axis 47 degrees    Calculated R Axis 6 degrees    Calculated T Axis 52 degrees   PTT (PARTIAL THROMBOPLASTIN TIME)   Result Value Ref Range    APTT 44.3 (H) 24.2 - 37.5 seconds   POC BLOOD GLUCOSE (RESULTS)   Result Value Ref Range    GLUCOSE, POC 143 (H) 70 - 105 mg/dl   POC BLOOD GLUCOSE (RESULTS)   Result Value Ref Range    GLUCOSE, POC 92 70 - 105 mg/dl   PTT (PARTIAL THROMBOPLASTIN TIME)  Result Value Ref Range    APTT 36.2 24.2 - 37.5 seconds   BASIC METABOLIC PANEL, FASTING   Result Value Ref Range    SODIUM 134 (L) 136 - 145 mmol/L    POTASSIUM 3.9 3.5 - 5.1 mmol/L    CHLORIDE 103 96 - 111 mmol/L    CO2 TOTAL 20 (L) 23 - 31 mmol/L    ANION GAP 11 4 - 13 mmol/L    CALCIUM 9.5 8.8 - 10.2 mg/dL    GLUCOSE 111 (H) 70 - 99 mg/dL    BUN 19 8 - 25 mg/dL    CREATININE 1.05 0.60 - 1.05 mg/dL    BUN/CREA RATIO 18 6 - 22    ESTIMATED GFR 53 (L) >=60 mL/min/BSA   PHOSPHORUS   Result Value Ref Range    PHOSPHORUS 3.3 2.3 - 4.0 mg/dL   MAGNESIUM   Result Value Ref Range    MAGNESIUM 2.0 1.8 - 2.6 mg/dL   LIPID PANEL   Result Value Ref Range    CHOLESTEROL  171 100 - 200 mg/dL    HDL CHOL 51 >=50 mg/dL    TRIGLYCERIDES 147 <150 mg/dL    LDL CALC 94 <100 mg/dL    VLDL CALC 24 <30 mg/dL    NON-HDL 120 <=190 mg/dL    CHOL/HDL RATIO 3.4    PTT (PARTIAL THROMBOPLASTIN TIME)   Result Value Ref Range    APTT 75.2 (H) 24.2 - 37.5 seconds   CBC WITH DIFF   Result Value Ref Range    WBC 7.3 3.7 - 11.0 x10^3/uL    RBC 4.06 3.85 - 5.22 x10^6/uL    HGB 12.1 11.5 - 16.0 g/dL    HCT 35.7 34.8 - 46.0 %    MCV 87.9 78.0 - 100.0 fL    MCH 29.8 26.0 - 32.0 pg    MCHC 33.9 31.0 - 35.5 g/dL    RDW-CV 13.1 11.5 - 15.5 %     PLATELETS 305 150 - 400 x10^3/uL    MPV 9.5 8.7 - 12.5 fL    NEUTROPHIL % 57 %    LYMPHOCYTE % 32 %    MONOCYTE % 9 %    EOSINOPHIL % 2 %    BASOPHIL % 0 %    NEUTROPHIL # 4.12 1.50 - 7.70 x10^3/uL    LYMPHOCYTE # 2.33 1.00 - 4.80 x10^3/uL    MONOCYTE # 0.64 0.20 - 1.10 x10^3/uL    EOSINOPHIL # 0.16 <=0.50 x10^3/uL    BASOPHIL # <0.10 <=0.20 x10^3/uL    IMMATURE GRANULOCYTE % 0 0 - 1 %    IMMATURE GRANULOCYTE # <0.10 <0.10 x10^3/uL   POC BLOOD GLUCOSE (RESULTS)   Result Value Ref Range    GLUCOSE, POC 100 70 - 105 mg/dl       Imaging Studies:     Coronary angiography August 09, 2021 - Left ventricular end diastolic pressure was 17 mmHg. The left main is moderate in size and patent. The LAD is moderate in size, ostial LAD difficult to assess due to vessel overlap, there is a 60% ostial LAD lesion noted. The LCx is large and dominant with a patent proximal stent and a distal 40% non obstructive lesion The right coronary artery is small non dominant and patent.  TTE 09/2019 - Normal LVF, abnormal diastolic dysfunction    Current Medications:  acetaminophen (TYLENOL) tablet, 650 mg, Oral, Q4H PRN  amLODIPine (NORVASC) tablet, 5 mg, Oral, Daily  aspirin chewable tablet 81 mg,  81 mg, Oral, Daily  D5W 250 mL flush bag, , Intravenous, Q15 Min PRN  ezetimibe (ZETIA) tablet, 10 mg, Oral, QPM  heparin 25,000 units in NS 250 mL infusion, 12 Units/kg/hr (Adjusted), Intravenous, Continuous  hydroCHLOROthiazide (HYDRODIURIL) tablet, 12.5 mg, Oral, Daily  isosorbide mononitrate (IMDUR) 24 hr extended release tablet, 120 mg, Oral, QAM  levothyroxine (SYNTHROID) tablet, 100 mcg, Oral, QAM  metoprolol tartrate (LOPRESSOR) tablet 75 mg, 75 mg, Oral, 2x/day  nitroGLYCERIN (NITROSTAT) sublingual tablet, 0.4 mg, Sublingual, Q5 Min PRN  NS 250 mL flush bag, , Intravenous, Q15 Min PRN  NS flush syringe, 2-6 mL, Intracatheter, Q8HRS  NS flush syringe, 2-6 mL, Intracatheter, Q1 MIN PRN  pantoprazole (PROTONIX) delayed release tablet, 40 mg,  Oral, Daily  perflutren lipid microspheres (DEFINITY) 1.3 mL in NS 10 mL (tot vol) injection, 2 mL, Intravenous, Give in Cardiology  SSIP insulin lispro 100 units/mL injection, 0-12 Units, Subcutaneous, 4x/day PRN        Allergies   Allergen Reactions    Pneumococcal 23-Valent Polysaccharide Vaccine  Other Adverse Reaction (Add comment)     Severe local swelling    Tramadol Nausea/ Vomiting     "makes me sick with or without food"    Iohexol  Other Adverse Reaction (Add comment) and Hives/ Urticaria      Desc: HIVES SEVERAL HRS S/P IV CONTRAST.. [place] LAST SCAN W/ BENADRYL PREMED @ [facility] '01/AC.    Lisinopril  Other Adverse Reaction (Add comment)     Other reaction(s): Cough    Other Rash     Other reaction(s): Other (See Comments)  Nickle   When teeth were wired in she place, patient obtained an infection and wiring had to be removed,  Also allergic to silver    Crestor [Rosuvastatin]      Muscle aches    Lovastatin     Statin [Atorvastatin]      Muscle ache    Iv Contrast Hives/ Urticaria    Nickel Hives/ Urticaria         Assessment/ Plan:  Active Hospital Problems    Diagnosis    Primary Problem: Coronary artery disease       81 year old female with prior medical history of coronary artery disease status post PCI to LCX x2, essential hypertension mixed dyslipidemia and type 2 diabetes mellitus admitted for known lesions with unstable angina.        Unstable Angina  CAD s/p PCI to LCx x2  Hypertension  HLD   -Troponins negative on arrival to [facility], peaked at 194  -Patient case reviewed with cardiac surgery, no indication for surgical intervention   -Interventional cardiology to review images   -Will need to optimize GDMT and increase anti-anginal medications    -Increase home Amlodipine to 5 mg and Lopressor to 75 mg bid    -Continue home Zetia, HCTZ, Imdur   -Holding home Plavix and ARB currently   -TTE pending   -Plan for LHC today, NPO currently     Hypothyroidism   -Continue home Synthroid     T2DM   -A1C  6.1  -Holding home tradjenta   -SSI protocol     DVT/PE ppx: heparin   Activity: as tolerated    Status: step down   Diet: NPO   PT/OT: following   Dispo: tbd     Jeanella Cara, MD      I saw and examined the patient.  I reviewed the resident's note.  I agree with the findings and plan  of care as documented in the resident's note.  Any exceptions/additions are edited/noted.    Dorothyann Peng, MD

## 2022-01-19 NOTE — Nurses Notes (Signed)
Patient transported to cath lab by bed. Patient pain free this shift. Spouse at bedside.

## 2022-01-19 NOTE — Nurses Notes (Signed)
Report called to floor RN prior to transfer. Patient returned to pre-procedure baseline assessment and vital signs. Patient transferred back to floor via transport.

## 2022-01-19 NOTE — Progress Notes (Signed)
INTERVAL UPDATE    Paged by nursing service regarding the patient having chest pain. Arrived at bedside to evaluate the patient who appeared comfortable and was asleep. Upon awakening the patient did endorse some chest discomfort rated at 5/10. She reports that it is different in location and quality, and milder, than the anginal symptoms she was having prior to admission. A repeat EKG was ordered and personally reviewed. ST and T wave changes slightly more pronounced as compared to baseline EKG. Low suspicion for acute stent thrombosis or peri-procedural MI.  Recommended SL nitro in the interim and will closely monitor with plans for a repeat EKG in 10-15 minutes. If symptoms or EKG changes worsen, will consider additional intervention.    Elenor Legato, MD  INTERVENTIONAL CARDIOLOGY FELLOW  PGY VII    I have seen and evaluated the patient with the cardiology fellow and agree with the findings and assessment in the note.    Blenda Mounts, MD, [facility], FSCAI  Associate Professor of Medicine  [facility]  [facility] of Medicine, Section of Cardiology

## 2022-01-19 NOTE — Care Plan (Signed)
01/19/22 1318   Therapist Pager   PT Assigned/ Pager # [phone]   Rehab Session   Document Type rehab contact note   Total PT Minutes: 0   Basic Mobility Am-PAC/6Clicks Score (APPROVED PT Staff, [facility], [facility] Nursing ONLY, [facility], [facility], and [facility])   Patient Mobility Barrier Patient Awaiting/ Receiving Surgery   Physical Therapy Clinical Impression   Criteria for Skilled Therapeutic yes;skilled treatment is necessary;meets criteria               [name] [name] DPT, PT.  Pager [phone]

## 2022-01-19 NOTE — Sedation Documentation (Signed)
01/19/22  Procedure(s):  CORONARY ANGIOGRAPHY W/LEFT HEART CATH W/WO LVG  CORONARY ARTERY DRUG ELUTING STENT - INITIAL VESSEL  INTRAVASCULAR ULTRASOUND    Diagnosis:     Sedation Informed Consent, pre-sedation risk assessment and evaluation completed.  History of previous adverse experiences with sedation/analgesia/anesthesia assessed.  Monitored conscious sedation was administered under my direct supervision by an appropriately trained sedation nurse.  Appropriate Facility and Equipment compliant.      Procedure time out  Timeouts       Denzil Hughes, RN at [facility] Jan 19, 2022 1224 EST       Timeout Details       Timeout type: Preprocedure              Procedures       Panel 1: CORONARY ANGIOGRAPHY W/LEFT HEART CATH W/WO LVG with Blenda Mounts, MD              Timeout Questions    Correct patient? Yes  Correct site? Yes  Correct side? N/A  Correct position? Yes  Correct procedure? Yes  Site marked? N/A  H&P note completed? Yes  Consents verified? Yes  Radiology studies available? Yes  Relevant lab results available? Yes  Allergies reviewed? Yes  Are all required blood products & devices for the procedure available? Yes  Is documentation verified? Yes             Staff Present       Physicians  Blenda Mounts, MD  Ward Givens, MD  Elenor Legato, MD Staff  Carrie Mew, Cardiac Care Technician  Nile Riggs, [place]  Wende Neighbors, STUDENT RAD [facility]  Denzil Hughes, RN  Meda Coffee, RN  Mercie Eon, Cardiac Care Technician              Signing History       Staff Performed Signed    Denzil Hughes, RN Caleen Essex Jan 19, 2022 1224 EST Fri Jan 19, 2022 1224 EST                            Physician in  and out times  Physicians       Name Panel Role Time Period    Blenda Mounts, MD Panel 1 Primary 01/19/2022 1228 - 01/19/2022 1329    Ward Givens, MD Panel 1 Fellow     Elenor Legato, MD Panel 1 Fellow 01/19/2022 1219 - 01/19/2022 1329          Sedation Staff       Name Type Time Period    Denzil Hughes, RN  Invasive Nurse 01/19/2022 1154    Meda Coffee, RN Invasive Nurse 01/19/2022 1154 - 01/19/2022 1229            Sedation and Procedure Times:  Sedation Start Time:: 1224  Sedation End/Recovery Start Time: 1326     Proc Name Event Type Event Time    CORONARY ANGIOGRAPHY W/LEFT HEART CATH W/WO LVG  Incision Start Fri Jan 19, 2022 12:29 PM    CORONARY ANGIOGRAPHY W/LEFT HEART CATH W/WO LVG  Incision Close Fri Jan 19, 2022  1:29 PM    CORONARY ARTERY DRUG ELUTING STENT - INITIAL VESSEL  Incision Start Fri Jan 19, 2022 12:35 PM    CORONARY ARTERY DRUG ELUTING STENT - INITIAL VESSEL  Incision Close Fri Jan 19, 2022  1:28 PM    INTRAVASCULAR ULTRASOUND  Incision Start Fri Jan 19, 2022 12:42 PM    INTRAVASCULAR ULTRASOUND  Incision Close Fri Jan 19, 2022  1:28 PM            Aldrete Scores    Pre Sedation  Activity: 2-->able to move 4 extremities voluntarily or on command  Respiration: 2-->able to breathe and cough freely  Circulation: 2-->BP within 20% of pre-anesthetic level  Consciousness: 2-->fully awake  O2 Saturation: 2-->able to maintain O2 saturation greater than 92% on room air  Dressing: 2-->dry and clean or not applicable  Pain: 2-->pain free  Ambulation: 2-->able to stand up and walk straight, on ordered bedrest, or performing at previous level of functioning  Fasting/Feeding: 2-->able to drink fluids or NPO, minimal nausea/ no vomiting  Urine Output: 2-->has voided, adequate urine output per device, or not applicable  Modified Aldrete Score: 20      Post Sedation  Assessment Scored:: Post-Procedure  Activity: 2-->able to move 4 extremities voluntarily or on command  Respiration: 2-->able to breathe and cough freely  Circulation: 2-->BP within 20% of pre-anesthetic level  Consciousness: 2-->fully awake  O2 Saturation: 2-->able to maintain O2 saturation greater than 92% on room air  Dressing: 2-->dry and clean or not applicable  Pain: 2-->pain free  Ambulation: 2-->able to stand up and walk straight, on ordered  bedrest, or performing at previous level of functioning  Urine Output: 2-->has voided, adequate urine output per device, or not applicable  Post Modified Aldrete Score: 20      Sedation Type: Moderate Sedation     Medications (moderate): Versed, Fentanyl     Unit: HVI  IV Type: Peripheral IV  Additional Intervention needed:: No     Effects of Administration: Successful sedation w/o adverse events       Patient was continuously monitored throughout the procedure.  Provider was in attendance throughout sedation.  See Invasive Procedure Log for additional details.    Elenor Legato, MD     The patient was continuously monitored throughout the procedure and in recovery. I was in attendance and supervised the sedation (during the start and stop times listed above) and remained immediately available until the patient returned to pre-procedure baseline.      Blenda Mounts, MD, [facility], FSCAI  Associate Professor of Medicine  [facility]  [facility] of Medicine, Section of Cardiology

## 2022-01-19 NOTE — Nurses Notes (Signed)
Patient returned from pre/post. Right radial dsg C/D/I- patient verbalizing no complaints. She denies pain/discomfort and nausea at present

## 2022-01-19 NOTE — Care Plan (Signed)
No CP overnight. VS per flow sheet. NPO midnight for potential cath. Heparin gtt continued. High fall precautions maintained. Sitter select in place and audible. Call bell within reach.      Problem: Adult Inpatient Plan of Care  Goal: Plan of Care Review  Outcome: Ongoing (see interventions/notes)  Goal: Patient-Specific Goal (Individualized)  Outcome: Ongoing (see interventions/notes)  Goal: Absence of Hospital-Acquired Illness or Injury  Outcome: Ongoing (see interventions/notes)  Intervention: Identify and Manage Fall Risk  Recent Flowsheet Documentation  Taken 01/18/2022 1953 by Liane Comber, RN  Safety Promotion/Fall Prevention:   activity supervised   fall prevention program maintained   safety round/check completed   motion sensor pad activated   nonskid shoes/slippers when out of bed  Intervention: Prevent Skin Injury  Recent Flowsheet Documentation  Taken 01/18/2022 1953 by Liane Comber, RN  Skin Protection:   adhesive use limited   tubing/devices free from skin contact   transparent dressing maintained   skin-to-device areas padded   incontinence pads utilized  Intervention: Prevent and Manage VTE (Venous Thromboembolism) Risk  Recent Flowsheet Documentation  Taken 01/18/2022 1953 by Liane Comber, RN  VTE Prevention/Management:   ambulation promoted   anticoagulant therapy maintained   dorsiflexion/plantar flexion performed  Intervention: Prevent Infection  Recent Flowsheet Documentation  Taken 01/18/2022 1953 by Liane Comber, RN  Infection Prevention:   barrier precautions utilized   glycemic control managed   personal protective equipment utilized   promote handwashing   rest/sleep promoted   single patient room provided  Goal: Optimal Comfort and Wellbeing  Outcome: Ongoing (see interventions/notes)  Intervention: Provide Person-Centered Care  Recent Flowsheet Documentation  Taken 01/18/2022 1953 by Liane Comber, [place] Relationship/Rapport:   care explained   choices provided   questions encouraged    reassurance provided   other (see comments)  Goal: Rounds/Family Conference  Outcome: Ongoing (see interventions/notes)     Problem: Fall Injury Risk  Goal: Absence of Fall and Fall-Related Injury  Outcome: Ongoing (see interventions/notes)  Intervention: Identify and Manage Contributors  Recent Flowsheet Documentation  Taken 01/18/2022 1953 by Liane Comber, RN  Self-Care Promotion: independence encouraged  Intervention: Promote Injury-Free Environment  Recent Flowsheet Documentation  Taken 01/18/2022 1953 by Liane Comber, RN  Safety Promotion/Fall Prevention:   activity supervised   fall prevention program maintained   safety round/check completed   motion sensor pad activated   nonskid shoes/slippers when out of bed     Problem: Pain Acute  Goal: Optimal Pain Control and Function  Outcome: Ongoing (see interventions/notes)  Intervention: Optimize Psychosocial Wellbeing  Recent Flowsheet Documentation  Taken 01/18/2022 1953 by Liane Comber, RN  Diversional Activities: television  Supportive Measures: active listening utilized     Problem: Chest Pain  Goal: Resolution of Chest Pain Symptoms  Outcome: Ongoing (see interventions/notes)     Problem: Acute Rehab Services Goal & Intervention Plan  Goal: Bathing Goal  Description: Stand Alone Therapy Goal  Outcome: Ongoing (see interventions/notes)  Goal: Bed Mobility Goal  Description: Stand Alone Therapy Goal  Outcome: Ongoing (see interventions/notes)  Goal: Caregiver Training Goal  Description: Stand Alone Therapy Goal  Outcome: Ongoing (see interventions/notes)  Goal: Cognition Goal  Description: Stand Alone Therapy Goal  Outcome: Ongoing (see interventions/notes)  Goal: Cognition Goals, SLP  Description: Stand Alone Therapy Goal  Outcome: Ongoing (see interventions/notes)  Goal: Communication Goals, SLP  Description: Stand Alone Therapy Goal  Outcome: Ongoing (see interventions/notes)  Goal: Dysphagia Goals, SLP  Description: Stand Alone Therapy Goal  Outcome:  Ongoing  (see interventions/notes)  Goal: Eating Self-Feeding Goal  Description: Stand Alone Therapy Goal  Outcome: Ongoing (see interventions/notes)  Goal: Gait Training Goal  Description: Stand Alone Therapy Goal  Outcome: Ongoing (see interventions/notes)  Goal: Grooming Goal  Description: Stand Alone Therapy Goal  Outcome: Ongoing (see interventions/notes)  Goal: Home Management Goal  Description: Stand Alone Therapy Goal  Outcome: Ongoing (see interventions/notes)  Goal: Interprofessional Goal  Description: Stand Alone Therapy Goal  Outcome: Ongoing (see interventions/notes)  Goal: LB Dressing Goal  Description: Stand Alone Therapy Goal  Outcome: Ongoing (see interventions/notes)  Goal: Occupational Therapy Goals  Description: Stand Alone Therapy Goal  Outcome: Ongoing (see interventions/notes)  Goal: Physical Therapy Goal  Description: Stand Alone Therapy Goal  Outcome: Ongoing (see interventions/notes)  Goal: Range of Motion Goal  Description: Stand Alone Therapy Goal  Outcome: Ongoing (see interventions/notes)  Goal: Strength Goal  Description: Stand Alone Therapy Goal  Outcome: Ongoing (see interventions/notes)  Goal: Toileting Goal  Description: Stand Alone Therapy Goal  Outcome: Ongoing (see interventions/notes)  Goal: Goal Transfer Training  Description: Stand Alone Therapy Goal  Outcome: Ongoing (see interventions/notes)  Goal: UB Dressing Goal  Description: Stand Alone Therapy Goal  Outcome: Ongoing (see interventions/notes)

## 2022-01-19 NOTE — Brief Op Note (Addendum)
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  Her symptoms were refractory to antianginal therapy in the outpatient setting.  She did undergo diagnostic invasive coronary angiography that was reportedly concerning for possible ostial LAD stenosis.  Due to her ongoing symptoms, she was referred to the ED for evaluation and plans for intravascular imaging of the LAD with possible referral to CT surgery for bypass grafting she was found to have a severe lesion.  She presents today for cardiac catheterization.    NAME OF PROCEDURES:  Left heart catheterization without left ventricular function study.  Selective coronary angiography.  Intravascular ultrasounds of the left main coronary artery, left anterior descending coronary artery and left circumflex coronary artery    Successful IVUS guided percutaneous coronary intervention of the mid left circumflex artery with a 3.5 x 15 mm Onyx Frontier drug-eluting stent which was overlapped proximally with the previously placed stent.  The entire stented segment was post dilated with a 3.5 x 15 mm NC balloon.  Right radial artery access    OPERATORS:  Lyn Henri, MD (Staff) Doree Barthel, MD (Fellow)     COMPLICATIONS: None    IMPRESSION:  1.  Mild diffuse disease of the LAD without angiographic evidence ostial stenosis using steep fluoroscopic angulations.  This was then further assessed using intravascular imaging with IVUS which revealed no evidence of significant stenosis.  2. Hazy, eccentric 70-80% stenosis (TIMI 3 Flow) of the mid left circumflex artery distal to the previously placed stent which was further investigated using intravascular imaging.  This revealed critical, heavily calcified stenosis within an inadequate minimal luminal area, status post successful complex percutaneous coronary intervention with a 3.5 x 15 mm Onyx Frontier drug-eluting stent which was overlapped proximally with the previously placed stent.  The entire stented segment was post dilated with a 3.5 x 50 mm NC balloon.  Post PCI angiography as well as intravascular imaging revealed good results with 0% residual stenosis, no evidence of edge dissection, and TIMI 3 flow throughout.  There was evidence of adequate stent expansion and apposition on intravascular imaging.  3. Otherwise nonobstructive coronary artery disease  4. Normal LVEDP of 7 mmHg    RECOMMENDATIONS:  The patient is to undergo standard postprocedure care by the primary service.  The patient is to continue with aggressive risk factor modification and medical therapy for coronary artery disease.  The patient was given a loading dose of Plavix 600 mg. The patient is to continue with dual anti-platelet therapy including aspirin indefinitely and Plavix for at least 6 months, preferably for 1 year.   The patient is to follow up with her primary care provider and primary cardiologist in the next 2-4 weeks.   The patient should follow in AMI clinic in 3-7  days following discharge.    Full note to follow    Doree Barthel, MD  Interventional Fellow, Cardiovascular Disease  [place] Department of Medicine, Section of Cardiology    See fellow's note for details.  I saw and evaluated the patient. I agree with the  fellow's description of the procedure, findings, impressions and recommendations.  I was present for the surgical procedures and otherwise immediately available for the duration of the procedure.  I have reviewed the images and confirmed or revised the interpretation as documented by the fellow.  I was present and supervised all procedures performed and participated in all critical aspects of these procedures.    Lyn Henri, MD, [facility], FSCAI  Associate Professor of [place], Section of Cardiology  [facility] [place] [facility]     See fellow's note for details.  I saw and evaluated the patient. I agree with the fellow's description of the procedure, findings, impressions and recommendations.  I was present for the surgical procedures and otherwise immediately available for the duration of the procedure.  I have reviewed the images and confirmed or revised the interpretation as documented by the fellow.  I was present and supervised all procedures performed and participated in all critical aspects of these procedures.    Lyn Henri, MD, [facility], FSCAI  Associate Professor of [place], Section of Cardiology  [facility] [place] [facility]

## 2022-01-19 NOTE — Nurses Notes (Signed)
Repeat ECG completed with no change. Nitro x3 resolved pain.

## 2022-01-19 NOTE — Nurses Notes (Signed)
Service MDs at bedside to assess patient chest pain.

## 2022-01-20 ENCOUNTER — Observation Stay (HOSPITAL_BASED_OUTPATIENT_CLINIC_OR_DEPARTMENT_OTHER): Payer: Medicare PPO

## 2022-01-20 ENCOUNTER — Other Ambulatory Visit: Payer: Self-pay

## 2022-01-20 DIAGNOSIS — E785 Hyperlipidemia, unspecified: Secondary | ICD-10-CM

## 2022-01-20 DIAGNOSIS — Z955 Presence of coronary angioplasty implant and graft: Secondary | ICD-10-CM

## 2022-01-20 DIAGNOSIS — E119 Type 2 diabetes mellitus without complications: Secondary | ICD-10-CM

## 2022-01-20 DIAGNOSIS — I2510 Atherosclerotic heart disease of native coronary artery without angina pectoris: Secondary | ICD-10-CM

## 2022-01-20 DIAGNOSIS — I249 Acute ischemic heart disease, unspecified: Secondary | ICD-10-CM

## 2022-01-20 DIAGNOSIS — R9431 Abnormal electrocardiogram [ECG] [EKG]: Secondary | ICD-10-CM

## 2022-01-20 DIAGNOSIS — E039 Hypothyroidism, unspecified: Secondary | ICD-10-CM

## 2022-01-20 DIAGNOSIS — R079 Chest pain, unspecified: Secondary | ICD-10-CM

## 2022-01-20 LAB — ECG 12-LEAD
Atrial Rate: 68 {beats}/min
Atrial Rate: 68 {beats}/min
Calculated P Axis: 47 degrees
Calculated P Axis: 59 degrees
Calculated R Axis: 0 degrees
Calculated R Axis: 6 degrees
Calculated T Axis: 14 degrees
Calculated T Axis: 96 degrees
PR Interval: 140 ms
PR Interval: 148 ms
QRS Duration: 94 ms
QRS Duration: 96 ms
QT Interval: 430 ms
QT Interval: 436 ms
QTC Calculation: 457 ms
QTC Calculation: 463 ms
Ventricular rate: 68 {beats}/min
Ventricular rate: 68 {beats}/min

## 2022-01-20 LAB — CBC WITH DIFF
BASOPHIL #: <0.1 10*3/uL (ref <=0.20)
BASOPHIL %: 0 %
EOSINOPHIL #: 0.1 10*3/uL (ref <=0.50)
EOSINOPHIL %: 1 %
HCT: 36 % (ref 34.8–46.0)
HGB: 12.1 g/dL (ref 11.5–16.0)
IMMATURE GRANULOCYTE #: <0.1 10*3/uL (ref <0.10)
IMMATURE GRANULOCYTE %: 0 % (ref 0–1)
LYMPHOCYTE #: 3.62 10*3/uL (ref 1.00–4.80)
LYMPHOCYTE %: 32 %
MCH: 30 pg (ref 26.0–32.0)
MCHC: 33.6 g/dL (ref 31.0–35.5)
MCV: 89.3 fL (ref 78.0–100.0)
MONOCYTE #: 1.21 10*3/uL — ABNORMAL HIGH (ref 0.20–1.10)
MONOCYTE %: 11 %
NEUTROPHIL #: 6.42 10*3/uL (ref 1.50–7.70)
NEUTROPHIL %: 56 %
PLATELETS: 319 10*3/uL (ref 150–400)
RBC: 4.03 10*6/uL (ref 3.85–5.22)
RDW-CV: 13.1 % (ref 11.5–15.5)
WBC: 11.4 10*3/uL — ABNORMAL HIGH (ref 3.7–11.0)

## 2022-01-20 LAB — MAGNESIUM: MAGNESIUM: 1.9 mg/dL (ref 1.8–2.6)

## 2022-01-20 LAB — TRANSTHORACIC ECHOCARDIOGRAM - ADULT
Aortic Valve Area by Continuity of Peak Velocity: 2.22 cm2
Aortic Valve Area by Continuity of VTI: 2.32 cm2
Ascending aorta: 2.73 cm
FS: 49 percent — AB (ref 28–44)
IVS: 0.8 cm (ref 0.6–1.1)
LV Diastolic Volume Index: 111.2 milliliter
LV Diastolic Volume Index: 55 milliliter
LV Diastolic Volume Index: 59.9 milliliter
LV Diastolic Volume Index: 64.5 milliliter
LV Systolic Volume Index: 15 milliliter
LV Systolic Volume Index: 18.9 milliliter
LV Systolic Volume Index: 22 milliliter
LV Systolic Volume Index: 22.4 milliliter
LVIDD: 4.87 cm (ref 3.5–6.0)
LVIDS: 2.48 cm (ref 2.1–4.0)
LVOT diameter: 1.75 cm
LVOT stroke volume: 55.98 milliliter
LVPWD: 0.84 cm
MV Deceleration Time: 221.15 ms
MV Peak A Vel: 97.95 cm/s
MV Peak E Vel: 61.6 cm/s
Pulmonic Valve Acceleration Time: 106.57 ms

## 2022-01-20 LAB — BASIC METABOLIC PANEL, FASTING
ANION GAP: 10 mmol/L (ref 4–13)
BUN/CREA RATIO: 19 (ref 6–22)
BUN: 20 mg/dL (ref 8–25)
CALCIUM: 9.4 mg/dL (ref 8.8–10.2)
CHLORIDE: 103 mmol/L (ref 96–111)
CO2 TOTAL: 21 mmol/L — ABNORMAL LOW (ref 23–31)
CREATININE: 1.03 mg/dL (ref 0.60–1.05)
ESTIMATED GFR: 55 mL/min/BSA — ABNORMAL LOW (ref >=60)
GLUCOSE: 93 mg/dL (ref 70–99)
POTASSIUM: 4.7 mmol/L (ref 3.5–5.1)
SODIUM: 134 mmol/L — ABNORMAL LOW (ref 136–145)

## 2022-01-20 LAB — PHOSPHORUS: PHOSPHORUS: 3.5 mg/dL (ref 2.3–4.0)

## 2022-01-20 LAB — POC BLOOD GLUCOSE (RESULTS): GLUCOSE, POC: 109 mg/dl — ABNORMAL HIGH (ref 70–105)

## 2022-01-20 MED ORDER — AMLODIPINE 5 MG TABLET
5.0000 mg | ORAL_TABLET | Freq: Every day | ORAL | 2 refills | Status: AC
Start: 2022-01-20 — End: ?
  Filled 2022-01-20: qty 60, 60d supply, fill #0

## 2022-01-20 MED ORDER — METOPROLOL TARTRATE 75 MG TABLET
75.0000 mg | ORAL_TABLET | Freq: Two times a day (BID) | ORAL | 1 refills | Status: DC
Start: 2022-01-20 — End: 2022-05-01
  Filled 2022-01-20: qty 60, 30d supply, fill #0

## 2022-01-20 MED ORDER — ALPRAZOLAM 0.5 MG TABLET
0.5000 mg | ORAL_TABLET | Freq: Every evening | ORAL | Status: DC | PRN
Start: 2022-01-20 — End: 2022-01-20

## 2022-01-20 MED ORDER — ALPRAZOLAM 0.25 MG TABLET
0.2500 mg | ORAL_TABLET | Freq: Once | ORAL | Status: AC
Start: 2022-01-20 — End: 2022-01-20
  Administered 2022-01-20: 0.25 mg via ORAL
  Filled 2022-01-20: qty 1

## 2022-01-20 MED ORDER — ASPIRIN 81 MG CHEWABLE TABLET
81.0000 mg | CHEWABLE_TABLET | Freq: Every day | ORAL | 3 refills | Status: DC
Start: 2022-01-20 — End: 2023-01-23
  Filled 2022-01-20: qty 90, 90d supply, fill #0

## 2022-01-20 NOTE — Discharge Summary (Signed)
[facility]  DISCHARGE SUMMARY    PATIENT NAME:  [name] [name], [name] [name]  MRN:  [id number]  DOB:      ENCOUNTER DATE:  01/17/2022  INPATIENT ADMISSION DATE:   DISCHARGE DATE:  01/20/2022    ATTENDING PHYSICIAN: Dorothyann Peng, MD  SERVICE: CARDIOLOGY GOLD 1  PRIMARY CARE PHYSICIAN: Harriette Bouillon, PA-C         LAY CAREGIVER:  ,  ,        PRIMARY DISCHARGE DIAGNOSIS: Coronary artery disease  Active Hospital Problems    Diagnosis Date Noted    Principal Problem: Coronary artery disease [I25.10] 01/18/2022    Unstable angina (CMS HCC) [I20.0] 07/26/2021      Resolved Hospital Problems   No resolved problems to display.     Active Non-Hospital Problems    Diagnosis Date Noted    CAD (coronary artery disease) 06/26/2021    History of percutaneous coronary intervention 06/26/2021    Type 2 diabetes mellitus (CMS [place]) 06/26/2021    Pulmonary HTN (CMS [place]) 06/26/2021    HTN (hypertension) 06/26/2021    Hyperlipidemia 06/26/2021    Hypothyroidism 06/26/2021    Angina, class III (CMS [place]) 01/19/2020        DISCHARGE MEDICATIONS:     Current Discharge Medication List        START taking these medications.        Details   aspirin 81 mg Tablet, Chewable   81 mg, Oral, DAILY  Qty: 90 Tablet  Refills: 3            CONTINUE these medications which have CHANGED during your visit.        Details   amLODIPine 5 mg Tablet  Commonly known as: NORVASC  What changed:   medication strength  See the new instructions.   5 mg, Oral, DAILY  Qty: 60 Tablet  Refills: 2     metoprolol tartrate 75 mg Tablet  Commonly known as: LOPRESSOR  What changed:   medication strength  how much to take   75 mg, Oral, 2 TIMES DAILY  Qty: 120 Tablet  Refills: 1            CONTINUE these medications - NO CHANGES were made during your visit.        Details   ALPRAZolam 0.5 mg Tablet  Commonly known as: XANAX   0.5 mg, Oral, EVERY EVENING  Refills: 0     B COMPLEX-VITAMIN B12 ORAL   Oral, DAILY  Refills: 0     Cholecalciferol (Vitamin D3) 10 mcg (400 unit)  Tablet, Chewable   Oral, DAILY  Refills: 0     clopidogreL 75 mg Tablet  Commonly known as: PLAVIX   75 mg, Oral, DAILY  Refills: 0     ezetimibe 10 mg Tablet  Commonly known as: ZETIA   10 mg, Oral, EVERY EVENING  Qty: 90 Tablet  Refills: 3     FLONASE NASL   Nasal  Refills: 0     Irbesartan-Hydrochlorothiazide 300-12.5 mg Tablet   300 mg, Oral, DAILY  Refills: 0     Isosorbide Mononitrate 120 mg Tablet Sustained Release 24 hr  Commonly known as: IMDUR   120 mg, Oral, EVERY MORNING  Qty: 90 Tablet  Refills: 3     levothyroxine 100 mcg Tablet  Commonly known as: SYNTHROID   100 mcg, Oral, EVERY MORNING  Refills: 0     nitroGLYCERIN 0.4 mg Tablet, Sublingual  Commonly known as: NITROSTAT  PLACE 1 TABLET UNDER TONGUE EVERY 5 MINS, UP TO 3 DOSES AS NEEDED FOR CHEST PAIN  Qty: 25 Tablet  Refills: 1     pantoprazole 40 mg Tablet, Delayed Release (E.C.)  Commonly known as: PROTONIX   40 mg, Oral, DAILY  Refills: 0     POTASSIUM-99 ORAL   Oral, DAILY  Refills: 0     Repatha SureClick 027 mg/mL Pen Injector  Generic drug: evolocumab   Inject 1 pen (140 mg) subcutaneously (under the skin) once every 14 days  Qty: 2 mL  Refills: 12     Tradjenta 5 mg Tablet  Generic drug: linaGLIPtin   5 mg, Oral, DAILY  Refills: 0     valACYclovir 500 mg Tablet  Commonly known as: VALTREX   500 mg, Oral, 2 TIMES DAILY  Refills: 0            STOP taking these medications.      cloNIDine HCL 0.1 mg Tablet  Commonly known as: CATAPRES            Discharge med list refreshed?  YES       CARDIOVASCULAR MEDICATIONS:  ASA: Yes  Statin: Yes  Antiplatelets (Plavix, Brilinta, Effient): Yes  Anticoagulants (Coumadin, Xarelto, Eliquis): No. Reason: not indicated  Beta Blocker:  Yes:   ACEI/ARB/ARNI:  Yes  Spironolactone Indicated (Heart Failure): No:  EF > or = to 40%        Heart Failure Discharge Labs  First admission (Creatinine): 1.06  Discharge Creatinine: Lab Results     Component                Value               Date                      CREATININE               1.03                01/20/2022            First admission (BUN)(BUN/Creatinine ration):   Discharge: BUN     Date                     Value               Ref Range           Status              01/20/2022               20                  8 - 25 mg/dL        Final          ----------  Warfarin: No      ALLERGIES:  Allergies   Allergen Reactions    Pneumococcal 23-Valent Polysaccharide Vaccine  Other Adverse Reaction (Add comment)     Severe local swelling    Tramadol Nausea/ Vomiting     "makes me sick with or without food"    Iohexol  Other Adverse Reaction (Add comment) and Hives/ Urticaria      Desc: HIVES SEVERAL HRS S/P IV CONTRAST.. [place] LAST SCAN W/ BENADRYL PREMED @ [facility] '01/AC.    Lisinopril  Other Adverse Reaction (Add comment)     Other reaction(s): Cough    Other Rash  Other reaction(s): Other (See Comments)  Nickle   When teeth were wired in she place, patient obtained an infection and wiring had to be removed,  Also allergic to silver    Crestor [Rosuvastatin]      Muscle aches    Lovastatin     Statin [Atorvastatin]      Muscle ache    Iv Contrast Hives/ Urticaria    Nickel Hives/ Urticaria         Weight:   Admit:  Weight: 76.9 kg (169 lb 8.5 oz) Discharge: Wt Readings from Last 1 Encounters:01/19/22 : 72.6 kg (160 lb 0.9 oz)         HOSPITAL PROCEDURE(S):   Bedside Procedures:  No orders of the defined types were placed in this encounter.    Surgical   Surgical/Procedural Cases on this Admission       Case IDs Date Procedure Surgeon Location Status    [id number] 01/19/22 CORONARY ANGIOGRAPHY W/LEFT HEART CATH W/WO LVGCORONARY ARTERY DRUG ELUTING STENT - INITIAL VESSELINTRAVASCULAR ULTRASOUND Lyn Henri, MD [place] [facility] OR/PROC [place] COURSE     BRIEF HPI:  This is a 81 y.o., female admitted for unstable angina with known lesions, followed with Dr. Deatra Canter.     BRIEF HOSPITAL NARRATIVE:         Unstable Angina  CAD s/p PCI to  LCx x2  Hypertension  HLD   -Troponins negative on arrival to [facility], peaked at 194 then decreased back to 10, unsure if 194 is accurate   -Patient case reviewed with cardiac surgery, no indication for surgical intervention   -Interventional cardiology to review images, took for LHC, did not believe LAD lesion culprit, report haziness in LCx with about 70% stenosis, stent placed here    -Will need to optimize GDMT and increase anti-anginal medications                 -Increase home Amlodipine to 5 mg and Lopressor to 75 mg bid                 -Otherwise continuing home hypertension medications                 -Will continue DAPT with aspirin and Plavix   -TTE completed, by visualization, EF appears well, final results need to be followed up on   -s/p LHC with stent to LCx   -Patient mildly hypertensive during hospital stay, can up titrate hypertension medications outpatient when acute setting passed      Hypothyroidism   -Continue home Synthroid      T2DM   -A1C 6.1  -Restarted home medications on discharge   -SSI protocol during hospital stay      TRANSITION/POST DISCHARGE CARE/PENDING TESTS/REFERRALS:   Follow up with Dr. Deatra Canter, can consider increasing anti-anginal medications if continues to have chest pain, possibly due to vaso spasms     If patient remains hypertensive after discharge consider increasing hypertension medication         CONDITION ON DISCHARGE:  A. Ambulation: Full ambulation  B. Self-care Ability: Complete  C. Cognitive Status Alert and Oriented x 3  D. Code status at discharge:             LINES/DRAINS/WOUNDS AT DISCHARGE:   Patient Lines/Drains/Airways Status       Active Line / Dialysis Catheter / Dialysis Graft / Drain / Airway / Wound  None                    DISCHARGE DISPOSITION:  Home discharge              DISCHARGE INSTRUCTIONS:  Post-Discharge Follow Up Appointments       Wednesday Mar 07, 2022    Return Patient Visit with Provider, Cardiology [facility] [place] at 10:00 AM      Wednesday  Nov 21, 2022    Return Patient Visit with Pasty Spillers Margaretha Sheffield, MD at 11:45 AM    Return Patient Visit with Provider, [facility]neddon at 11:45 AM      Cardiology [place], [facility]  [facility], [address]  [place] [address]  [phone] Cardiology, [place], [place]  [address]  [place] [address]  [phone]             CARDIAC REHAB PHASE II    Individualized education will be given based on the patient's diagnosis and needs.    Therapist/Nurse may place secondary order for Daily Documentation orders if needed.      Indication for Therapy PTCA/STENT    Indication for Therapy MYOCARDIAL INFARCTION    Method to Determine THR for Program Duration TO BE DETERMINED BY THE CARDIAC REHAB STAFF    Exercise Intensity or MET Level TO BE DETERMINED BY CARDIAC REHAB STAFF      FOLLOW-UP: [facility] - CARDIOLOGY - [place], [place]     Reason for visit: HOSPITAL DISCHARGE    Follow-up reason: OTHER    Follow-up in: 2 WEEKS    Schedule with Provider Dr. Deatra Canter    Provider: First Available Physician                 Jeanella Cara, MD    Copies sent to Care Team         Relationship Specialty Notifications Start End    Harriette Bouillon, [place] PCP - General PHYSICIAN ASSISTANT  03/10/21     Phone: [phone] Fax: [phone]         [place] [address]              Referring providers can utilize [email or web address] to access their referred [place] patient's information.                  I saw and examined the patient.  I reviewed the resident's note.  I agree with the findings and plan of care as documented in the resident's note.  Any exceptions/additions are edited/noted.    Dorothyann Peng, MD

## 2022-01-20 NOTE — Progress Notes (Signed)
[name]  Cardiology   Progress Note      [name], [name] [name]  Date of Admission:  01/17/2022  Date of service: 01/20/2022  Date of Birth:    MRN:  [id number]    Hospital Day:  LOS: 0 days   Subjective:Patient is resting comfortably in bed. She reports an episode of chest pain after the LHC that was on the left and radiated to her back. She reports it only improved after taking 3 nitros. She denies any pain, sob, lightheadedness since this episode.     Objective:     Vital Signs:  Temp (24hrs) Max:36.9 C (98.4 F)      Temperature: 36.6 C (97.8 F) (01/20/22 0256)  BP (Non-Invasive): (!) 167/52 (01/20/22 0256)  MAP (Non-Invasive): 82 mmHG (01/20/22 0256)  Heart Rate: 58 (01/20/22 0256)  Respiratory Rate: 17 (01/20/22 0256)  SpO2: 98 % (01/20/22 0256)  Physical Exam:  General: Alert and Oriented x3, appears stated age, no acute cardiopulmonary distress   Cardiovascular: RRR, S1/S2 audible  Lungs: CTA b/l, no rales, rhonchi, or wheezing   Abd: soft, non tender and non distended, no guarding or rebound, normal bowel sounds  Ext: no clubbing, cyanosis, or edema, R radial access site without hematoma or bleeding   Neuro: CN's intact, no focal deficits  Skin: Warm and dry, no rashes  Psych: Normal mood and affect    I/O:  I/O last 24 hours:      Intake/Output Summary (Last 24 hours) at 01/20/2022 0724  Last data filed at 01/20/2022 0259  Gross per 24 hour   Intake 736.65 ml   Output 347 ml   Net 389.65 ml     I/O current shift:  No intake/output data recorded.  Blood Sugars: Last Fingerstick:  No results found for: GLUCOSEPOC    Labs:  Lab Results Today:    Results for orders placed or performed during the hospital encounter of 01/17/22 (from the past 24 hour(s))   TYPE AND SCREEN   Result Value Ref Range    UNITS ORDERED NOT STATED     ABO/RH(D) A POSITIVE     ANTIBODY SCREEN NEGATIVE     SPECIMEN EXPIRATION DATE 01/22/2022,2359    POC BLOOD GLUCOSE (RESULTS)   Result Value Ref Range    GLUCOSE, POC 95 70 -  105 mg/dl   POC ACT CELITE (RESULTS)   Result Value Ref Range    ACT CELITE, POC 294 (H) 74-125 sec (Prewarmed), 84-139 sec (Non-Prewarmed) seconds   POC ACT CELITE (RESULTS)   Result Value Ref Range    ACT CELITE, POC 298 (H) 74-125 sec (Prewarmed), 84-139 sec (Non-Prewarmed) seconds   POC BLOOD GLUCOSE (RESULTS)   Result Value Ref Range    GLUCOSE, POC 163 (H) 70 - 105 mg/dl   BASIC METABOLIC PANEL, FASTING   Result Value Ref Range    SODIUM 134 (L) 136 - 145 mmol/L    POTASSIUM 4.7 3.5 - 5.1 mmol/L    CHLORIDE 103 96 - 111 mmol/L    CO2 TOTAL 21 (L) 23 - 31 mmol/L    ANION GAP 10 4 - 13 mmol/L    CALCIUM 9.4 8.8 - 10.2 mg/dL    GLUCOSE 93 70 - 99 mg/dL    BUN 20 8 - 25 mg/dL    CREATININE 1.03 0.60 - 1.05 mg/dL    BUN/CREA RATIO 19 6 - 22    ESTIMATED GFR 55 (L) >=60 mL/min/BSA   MAGNESIUM   Result Value  Ref Range    MAGNESIUM 1.9 1.8 - 2.6 mg/dL   PHOSPHORUS   Result Value Ref Range    PHOSPHORUS 3.5 2.3 - 4.0 mg/dL   CBC WITH DIFF   Result Value Ref Range    WBC 11.4 (H) 3.7 - 11.0 x10^3/uL    RBC 4.03 3.85 - 5.22 x10^6/uL    HGB 12.1 11.5 - 16.0 g/dL    HCT 36.0 34.8 - 46.0 %    MCV 89.3 78.0 - 100.0 fL    MCH 30.0 26.0 - 32.0 pg    MCHC 33.6 31.0 - 35.5 g/dL    RDW-CV 13.1 11.5 - 15.5 %    PLATELETS 319 150 - 400 x10^3/uL    NEUTROPHIL % 56 %    LYMPHOCYTE % 32 %    MONOCYTE % 11 %    EOSINOPHIL % 1 %    BASOPHIL % 0 %    NEUTROPHIL # 6.42 1.50 - 7.70 x10^3/uL    LYMPHOCYTE # 3.62 1.00 - 4.80 x10^3/uL    MONOCYTE # 1.21 (H) 0.20 - 1.10 x10^3/uL    EOSINOPHIL # 0.10 <=0.50 x10^3/uL    BASOPHIL # <0.10 <=0.20 x10^3/uL    IMMATURE GRANULOCYTE % 0 0 - 1 %    IMMATURE GRANULOCYTE # <0.10 <0.10 x10^3/uL   POC BLOOD GLUCOSE (RESULTS)   Result Value Ref Range    GLUCOSE, POC 109 (H) 70 - 105 mg/dl       Imaging Studies:     Coronary angiography August 09, 2021 - Left ventricular end diastolic pressure was 17 mmHg. The left main is moderate in size and patent. The LAD is moderate in size, ostial LAD difficult to  assess due to vessel overlap, there is a 60% ostial LAD lesion noted. The LCx is large and dominant with a patent proximal stent and a distal 40% non obstructive lesion The right coronary artery is small non dominant and patent.  TTE 09/2019 - Normal LVF, abnormal diastolic dysfunction    Current Medications:  acetaminophen (TYLENOL) tablet, 650 mg, Oral, Q4H PRN  ALPRAZolam (XANAX) tablet, 0.5 mg, Oral, HS PRN  amLODIPine (NORVASC) tablet, 5 mg, Oral, Daily  aspirin chewable tablet 81 mg, 81 mg, Oral, Daily  clopidogrel (PLAVIX) 75 mg tablet, 75 mg, Oral, Daily  D5W 250 mL flush bag, , Intravenous, Q15 Min PRN  diphenhydrAMINE (BENADRYL) 50 mg/mL injection, 50 mg, Intravenous, Once  ezetimibe (ZETIA) tablet, 10 mg, Oral, QPM  famotidine (PEPCID) tablet, 20 mg, Oral, 2x/day  hydroCHLOROthiazide (HYDRODIURIL) tablet, 12.5 mg, Oral, Daily  isosorbide mononitrate (IMDUR) 24 hr extended release tablet, 120 mg, Oral, QAM  levothyroxine (SYNTHROID) tablet, 100 mcg, Oral, QAM  methylPREDNISolone sod succ (SOLU-MEDROL) 125 mg/2 mL injection, 50 mg, Intravenous, Now  metoprolol tartrate (LOPRESSOR) tablet 75 mg, 75 mg, Oral, 2x/day  nitroGLYCERIN (NITROSTAT) sublingual tablet, 0.4 mg, Sublingual, Q5 Min PRN  NS 250 mL flush bag, , Intravenous, Q15 Min PRN  NS flush syringe, 2-6 mL, Intracatheter, Q8HRS  NS flush syringe, 2-6 mL, Intracatheter, Q1 MIN PRN  ondansetron (ZOFRAN) 2 mg/mL injection, 4 mg, Intravenous, Q8H PRN  pantoprazole (PROTONIX) delayed release tablet, 40 mg, Oral, Daily  perflutren lipid microspheres (DEFINITY) 1.3 mL in NS 10 mL (tot vol) injection, 2 mL, Intravenous, Give in Cardiology  SSIP insulin lispro 100 units/mL injection, 0-12 Units, Subcutaneous, 4x/day PRN        Allergies   Allergen Reactions    Pneumococcal 23-Valent Polysaccharide Vaccine  Other Adverse Reaction (  Add comment)     Severe local swelling    Tramadol Nausea/ Vomiting     "makes me sick with or without food"    Iohexol  Other  Adverse Reaction (Add comment) and Hives/ Urticaria      Desc: HIVES SEVERAL HRS S/P IV CONTRAST.. [place] LAST SCAN W/ BENADRYL PREMED @ [facility] '01/AC.    Lisinopril  Other Adverse Reaction (Add comment)     Other reaction(s): Cough    Other Rash     Other reaction(s): Other (See Comments)  Nickle   When teeth were wired in she place, patient obtained an infection and wiring had to be removed,  Also allergic to silver    Crestor [Rosuvastatin]      Muscle aches    Lovastatin     Statin [Atorvastatin]      Muscle ache    Iv Contrast Hives/ Urticaria    Nickel Hives/ Urticaria         Assessment/ Plan:  Active Hospital Problems    Diagnosis    Primary Problem: Coronary artery disease    Unstable angina (CMS [facility])       81 year old female with prior medical history of coronary artery disease status post PCI to LCX x2, essential hypertension mixed dyslipidemia and type 2 diabetes mellitus admitted for known lesions with unstable angina.        Unstable Angina  CAD s/p PCI to LCx x2  Hypertension  HLD   -Troponins negative on arrival to [facility], peaked at 194  -Patient case reviewed with cardiac surgery, no indication for surgical intervention   -Interventional cardiology to review images   -Will need to optimize GDMT and increase anti-anginal medications    -Increase home Amlodipine to 5 mg and Lopressor to 75 mg bid    -Otherwise continuing home hypertension medications    -Will continue DAPT with aspirin and Plavix   -TTE completed, by visualization, EF appears well, final results need to be followed up on   -s/p LHC with stent to LCx     Hypothyroidism   -Continue home Synthroid     T2DM   -A1C 6.1  -Holding home tradjenta   -SSI protocol     DVT/PE ppx: heparin   Activity: as tolerated    Status: step down   Diet: NPO   PT/OT: following   Dispo: tbd     Jeanella Cara, MD        I saw and examined the patient.  I reviewed the resident's note.  I agree with the findings and plan of care as documented in the resident's note.  Any  exceptions/additions are edited/noted.    Dorothyann Peng, MD

## 2022-01-20 NOTE — Nurses Notes (Signed)
Patient ordered discharge to home. IV lines removed and tip intact. Reviewed hospital course, diet, activity, wound care, medications and follow-up appointment with the patient. No questions or concerns at this time. Patient taken by wheelchair from unit for discharge.

## 2022-01-20 NOTE — Ancillary Notes (Signed)
Visited patient at bedside to complete post-Stent education and patient verbalizes understanding. Reviewed risk factors for CAD, Lifting/pushing/pulling restrictions, stair climbing, driving, wound care, diet/exercise, and Phase II Cardiac Rehab. Patient was provided handout for review. Will continue to follow up with patient to provide cardiac rehab services as tolerated.     Patient in bed with call bell within reach.   Tyjai Matuszak BS ES  Cardiac Rehab   #[id number]

## 2022-01-22 LAB — ECG 12 LEAD - (AMB USE ONLY)(MUSE, IN CLINIC)
Atrial Rate: 55 {beats}/min
Calculated P Axis: 109 degrees
Calculated R Axis: 111 degrees
Calculated T Axis: 102 degrees
PR Interval: 160 ms
QRS Duration: 98 ms
QT Interval: 466 ms
QTC Calculation: 445 ms
Ventricular rate: 55 {beats}/min

## 2022-01-25 ENCOUNTER — Other Ambulatory Visit: Payer: Self-pay | Admitting: Pharmacist

## 2022-01-25 ENCOUNTER — Other Ambulatory Visit: Payer: Self-pay

## 2022-01-25 NOTE — Telephone Encounter (Signed)
Specialty Pharmacy Initial Assessment Note    Date of assessment: 01/25/2022  Patient: [name] [name] is a 81 y.o. female  Diagnosis: hyperlipidemia w/ known ASCVD  Specialty Medication(s): Repatha 140 mg pen SC every 14 days  Prescriber: Dr. Dorita Fray    Subjective:  Spoke with patient today regarding initial Repatha fill with our pharmacy. Reports starting less than 1 year ago (per Epic looks like Sept 2022). Denies SE or trouble completing injections. Her niece who is a Engineer, civil (consulting) helps occasionally when in town. Takes every other Wednesday; last dose was 2/15 and she has zero pens left on hand.    Reports overall quality of life as fair. Reports having a new heart stent placed last Friday.     She declined to update medication list today stating no changes since it was updated upon hospital d/c.    Objective:   Pertinent labs include:   01/19/22 Current LDL (calc) 94  05/24/21 Baseline LDL (direct) 180    Assessment:  -reviewed medications, conditions, and allergies with no known contraindications to therapy  -no significant drug-drug interactions expected  -medication therapy is considered appropriate with a therapeutic goal of LDL < 55    Plan:  -patient declined education today since she is familiar w/ the medication  -medication to be delivered Tues 2/28  -scheduled pharmacist follow-up for next refill    Patient had no additional questions and was encouraged to contact [facility] with any questions or concerns.     Ave Filter, [facility]  01/25/2022, 15:46

## 2022-01-29 ENCOUNTER — Other Ambulatory Visit: Payer: Self-pay

## 2022-02-06 ENCOUNTER — Ambulatory Visit (INDEPENDENT_AMBULATORY_CARE_PROVIDER_SITE_OTHER): Payer: Medicare PPO | Admitting: Physician Assistant

## 2022-02-06 ENCOUNTER — Other Ambulatory Visit: Payer: Self-pay

## 2022-02-06 ENCOUNTER — Encounter (INDEPENDENT_AMBULATORY_CARE_PROVIDER_SITE_OTHER): Payer: Self-pay

## 2022-02-06 VITALS — BP 143/71 | HR 64 | Ht 62.0 in | Wt 162.6 lb

## 2022-02-06 DIAGNOSIS — I10 Essential (primary) hypertension: Secondary | ICD-10-CM

## 2022-02-06 DIAGNOSIS — E785 Hyperlipidemia, unspecified: Secondary | ICD-10-CM

## 2022-02-06 DIAGNOSIS — E119 Type 2 diabetes mellitus without complications: Secondary | ICD-10-CM

## 2022-02-06 DIAGNOSIS — I2510 Atherosclerotic heart disease of native coronary artery without angina pectoris: Secondary | ICD-10-CM

## 2022-02-06 DIAGNOSIS — Z95820 Peripheral vascular angioplasty status with implants and grafts: Secondary | ICD-10-CM

## 2022-02-06 NOTE — Progress Notes (Signed)
[place], [place]  [address]  [place] [address]  Phone: [phone]  Fax: [phone]    Encounter Date: 02/06/2022    Patient ID:  [name] [name]  VEH:[id number]    DOB:   Age: 81 y.o. female    Subjective:     Chief Complaint   Patient presents with   . Follow Up   . Shortness of Breath   . Fatigue   . Nausea     HPI   [name] [name] is a pleasant 81 y.o. female with known CAD s/p PCI to the LCx x2 and more recently IVUS guided PCI on 01/19/2022 to the mid LCx x1 that was overlapped proximally with previously placed stent for unstable angina, HTN, HLD and DMT2. A TTE showed preserved EF at 68.5% and no significant valve disease. She was discharged home on 01/20/2022. Her antianginal therapy was titrated with Lopressor increased to 75 mg twice daily and amlodipine increased to 23m daily. She was unable to tolerate the increased dose of Lopressor, so she is now taking 50 mg in the morning and 75 mg in the evening with improvement. She also remains on Imdur 123monce daily and Ranexa 500 mg twice daily. She reports having two incidents of chest pain since intervention with one episode occurring around 1:00am and the other while at rest during the day. Both episodes only lasted a few minutes and resolved with 1 SL NTG. Her symptoms have improved significanly compared to what she was having prior to intervention. She also reports having more energy and is attending cardiac rehab. She follows with Dr. MoPasty Spillersor lipid management and remains on Repatha 140 mg every two weeks and Zetia 10 mg once daily. No bleeding complications with ASA and Plavix.           Current Outpatient Medications   Medication Sig   . ALPRAZolam (XANAX) 0.5 mg Oral Tablet Take 1 Tablet (0.5 mg total) by mouth Every evening   . amLODIPine (NORVASC) 5 mg Oral Tablet Take 1 Tablet (5 mg total) by mouth Once a day   . aspirin 81 mg Oral Tablet, Chewable Chew 1 Tablet (81 mg total) Once  a day   . Cholecalciferol, Vitamin D3, 10 mcg (400 unit) Oral Tablet, Chewable Chew Once a day   . clopidogreL (PLAVIX) 75 mg Oral Tablet Take 1 Tablet (75 mg total) by mouth Once a day   . evolocumab (REPATHA SURECLICK) 14283g/mL Subcutaneous Pen Injector Inject 1 pen (140 mg) subcutaneously (under the skin) once every 14 days   . ezetimibe (ZETIA) 10 mg Oral Tablet Take 1 Tablet (10 mg total) by mouth Every evening   . fluticasone propionate (FLONASE NASL) by Nasal route   . Irbesartan-Hydrochlorothiazide 300-12.5 mg Oral Tablet Take 300 mg by mouth Once a day   . Isosorbide Mononitrate (IMDUR) 120 mg Oral Tablet Sustained Release 24 hr TAKE 1 TABLET (120 MG TOTAL) BY MOUTH EVERY MORNING   . levothyroxine (SYNTHROID) 112 mcg Oral Tablet Take 1 Tablet (112 mcg total) by mouth Every morning   . metoprolol tartrate (LOPRESSOR) 75 mg Oral Tablet Take 1 Tablet (75 mg total) by mouth Twice daily (Patient taking differently: Take 1 Tablet (75 mg total) by mouth Twice daily 5049mn the AM and 17m71m PM)   . nitroGLYCERIN (NITROSTAT) 0.4 mg Sublingual Tablet, Sublingual PLACE 1 TABLET UNDER TONGUE EVERY 5 MINS, UP TO 3 DOSES AS NEEDED FOR CHEST PAIN   .  pantoprazole (PROTONIX) 40 mg Oral Tablet, Delayed Release (E.C.) Take 1 Tablet (40 mg total) by mouth Once a day   . POTASSIUM-99 ORAL Take by mouth Once a day   . ranolazine (RANEXA) 500 mg Oral Tablet Sustained Release 12 hr Take by mouth Twice daily   . TRADJENTA 5 mg Oral Tablet Take 1 Tablet (5 mg total) by mouth Once a day   . valACYclovir (VALTREX) 500 mg Oral Tablet Take 1 Tablet (500 mg total) by mouth Twice daily   . vitamin B complex (B COMPLEX-VITAMIN B12 ORAL) Take by mouth Once a day     Allergies   Allergen Reactions   . Pneumococcal 23-Valent Polysaccharide Vaccine  Other Adverse Reaction (Add comment)     Severe local swelling   . Tramadol Nausea/ Vomiting     "makes me sick with or without food"   . Iohexol  Other Adverse Reaction (Add comment) and  Hives/ Urticaria      Desc: HIVES SEVERAL HRS S/P IV CONTRAST.. [place] LAST SCAN W/ BENADRYL PREMED @ [facility] '01/AC.   Marland Kitchen Lisinopril  Other Adverse Reaction (Add comment)     Other reaction(s): Cough   . Other Rash     Other reaction(s): Other (See Comments)  Nickle   When teeth were wired in she place, patient obtained an infection and wiring had to be removed,  Also allergic to silver   . Crestor [Rosuvastatin]      Muscle aches   . Lovastatin    . Statin [Atorvastatin]      Muscle ache   . Iv Contrast Hives/ Urticaria   . Nickel Hives/ Urticaria     Past Medical History:   Diagnosis Date   . Coronary artery disease    . Dyspnea on exertion    . Essential hypertension    . Hyperlipidemia    . PONV (postoperative nausea and vomiting)    . Stented coronary artery    . Type 2 diabetes mellitus (CMS HCC)          Past Surgical History:   Procedure Laterality Date   . CARDIAC CATHETERIZATION     . CATARACT EXTRACTION, BILATERAL Bilateral    . HX COLONOSCOPY     . HX HEART CATHETERIZATION  2017 and 2018    Stented twice  prox circ  and circ   . HX PARTIAL HYSTERECTOMY      tubes and ovaries left intact   . HX THYROIDECTOMY  1978   . HX WISDOM TEETH EXTRACTION     . KNEE SURGERY  2018    muscle removed   . LAPAROSCOPIC CHOLECYSTECTOMY     . RECONSTRUCTION OF NOSE  1980         Family Medical History:     Problem Relation (Age of Onset)    Cardiomyopathy Father    Heart Attack Mother    Heart Disease Sister    Uterine Cancer Mother, Niece          Social History     Tobacco Use   . Smoking status: Former     Packs/day: 0.50     Years: 10.00     Pack years: 5.00     Types: Cigarettes   . Smokeless tobacco: Never   Vaping Use   . Vaping Use: Never used   Substance Use Topics   . Alcohol use: Not Currently     Alcohol/week: 1.0 standard drink     Types: 1 Cans of  beer per week     Comment: drank long time ago not current.   . Drug use: Never       Review of Systems   Constitutional: Positive for fatigue.   Respiratory: Positive for  shortness of breath.    Cardiovascular: Positive for chest pain.   Gastrointestinal: Positive for nausea.     Objective:   Vitals: BP (!) 143/71 (Site: Left, Patient Position: Sitting)   Pulse 64   Ht 1.575 m (5' 2" )   Wt 73.8 kg (162 lb 9.6 oz)   SpO2 96% Comment: ra  BMI 29.74 kg/m         Physical Exam  Vitals and nursing note reviewed.   Constitutional:       Appearance: She is well-developed.   Neck:      Vascular: No JVD.   Cardiovascular:      Rate and Rhythm: Normal rate and regular rhythm.      Pulses: Normal pulses.      Heart sounds: S1 normal and S2 normal. No murmur heard.    No friction rub. No gallop.   Pulmonary:      Effort: Pulmonary effort is normal. No respiratory distress.      Breath sounds: Normal breath sounds. No wheezing or rales.   Chest:      Chest wall: No tenderness.   Abdominal:      General: Bowel sounds are normal.      Palpations: Abdomen is soft.      Tenderness: There is no abdominal tenderness.   Musculoskeletal:         General: No tenderness. Normal range of motion.      Cervical back: Normal range of motion and neck supple.   Neurological:      Mental Status: She is alert and oriented to person, place, and time.   Psychiatric:         Behavior: Behavior normal.       Last CBC  (Last result in the past 2 years)      WBC   HGB   HCT   MCV   Platelets      01/20/22 0336 11.4   12.1   36.0   89.3   319          Last BMP  (Last result in the past 2 years)      Na   K   Cl   CO2   BUN   Cr   Calcium   Glucose   Glucose-Fasting        01/20/22 0316 134   4.7   103   21   20   1.03   9.4   93           Last Hepatic Panel  (Last result in the past 2 years)      Albumin   Total PTN   Total Bili   Direct Bili   Ast/SGOT   Alt/SGPT   Alk Phos        05/24/21 0000         18           05/24/21 0000           18             Last Lipid Panel  (Last 3 results in the past 547 days)      Cholesterol   HDL   LDL   Direct LDL   Triglycerides  01/19/22 0447 171   51   94  Comment: <100  mg/dL, Optimal  100-129 mg/dL, Near/Above Optimal  130-159 mg/dL, Borderline High  160-189 mg/dL, High  >=190 mg/dL, Very high     147      11/15/21 0000 222   41       354      11/15/21 0000       130        05/24/21 0000 271   41   169     304      05/24/21 0000       180            Lab Results   Component Value Date    HA1C 6.1 (H) 01/17/2022     Lab Results   Component Value Date    FREET4 1.19 05/24/2021       Assessment & Plan:     ENCOUNTER DIAGNOSES     ICD-10-CM   1. CAD in native artery  I25.10   2. S/P angioplasty with stent  Z95.820   3. HTN (hypertension)  I10   4. Hyperlipidemia  E78.5   5. Type 2 diabetes mellitus (CMS HCC)  E11.9     In summary, [name] [name] is a pleasant 81 y.o. female with known CAD s/p PCI to the LCx x2 and more recently IVUS guided PCI on 01/19/2022 to the mid LCx x1 that was overlapped proximally with previously placed stent for unstable angina, HTN, HLD and DMT2. A TTE during her recent admission showed preserved EF at 68.5% and no significant valve disease. She was discharged home on 01/20/2022. Her antianginal therapy was titrated with Lopressor increased to 75 mg twice daily and amlodipine increased to 41m daily. She was unable to tolerate the increased dose of Lopressor, so she is now taking 50 mg in the morning and 75 mg in the evening with improvement. She also remains on Imdur 1254monce daily and Ranexa 500 mg twice daily. She reports having two incidents of chest pain since intervention with one episode occurring around 1:00am and the other while at rest during the day. Both episodes only lasted a few minutes and resolved with 1 SL NTG. Her symptoms have improved significanly compared to what she was having prior to intervention. She also reports having more energy and is attending cardiac rehab. She follows with Dr. MoPasty Spillersor lipid management and remains on Repatha 140 mg every two weeks and Zetia 10 mg once daily. No bleeding complications with ASA and Plavix. We  discussed medical management. Symptoms appear stable and improved with relief with 1 SL NTG. Hopefully with rehab and medical therapy, she'll have improvement. She agreed to call with more frequent episodes of chest pain or changes in nature requiring more therapy.    No orders of the defined types were placed in this encounter.    Patient was seen independently with supervising physician not present but available for consultation.    Return in about 3 months (around 05/09/2022), or if symptoms worsen or fail to improve.    AsSamuella BruinPA-C  02/06/2022, 12:07  [place]

## 2022-02-12 ENCOUNTER — Other Ambulatory Visit: Payer: Self-pay

## 2022-02-14 ENCOUNTER — Other Ambulatory Visit (INDEPENDENT_AMBULATORY_CARE_PROVIDER_SITE_OTHER): Payer: Self-pay

## 2022-02-14 NOTE — Telephone Encounter (Signed)
Patient is not established at [facility]. Will need appointment for medication refills. Larena Sox, RN  02/14/2022, 10:24

## 2022-02-16 ENCOUNTER — Ambulatory Visit (INDEPENDENT_AMBULATORY_CARE_PROVIDER_SITE_OTHER): Payer: Self-pay | Admitting: NURSE PRACTITIONER, FAMILY

## 2022-02-16 NOTE — Telephone Encounter (Addendum)
-----   Message from Curlene Labrum, NP sent at 02/15/2022  1:55 PM EDT -----  ----- Message from Lupita Shutter sent at 02/15/2022  9:15 AM EDT -----  Pt called and said she was recently in the hospital and they increased her Metoprolol from 50 to 75 mg. She said it made her break out in hives. She wanted to know if the med needs decreased or what she needs to do.      Shomo-Cross, [place], NP  [place], [place], RN  It looks like she has been on metoprolol for awhile, and they just adjusted the dose, so it may be something else that is causing her issues. Any other changes in her medications, supplements, detergents, etc?     Curlene Labrum, NP 02/15/2022, 13:57       Called and spoke with patient, she states that she has not made any other changes in her lifestyle other than the metoprolol dose adjustment. She states that she is also having nausea when she takes it and is itching all over. Patient states that she believes that the metoprolol is what is causing issues. She states that she went back to taking the 50 mg in the morning and she hasn't gotten sick with it but when she takes the 75 mg in the evening she is getting nauseous.  [facility] [place], RN  02/16/2022, 08:59      Shomo-Cross, [place], NP  [place], [place], RN  Have her go back to 50 mg twice daily, and see if this helps with her symptoms. Have her call us on Monday with an update.     Thanks!   [place] Shomo-Cross, NP 02/16/2022, 14:35         Pt notified with information above and verbalized understanding.   [facility] [address] [place], RN  02/16/2022, 15:14

## 2022-02-22 ENCOUNTER — Ambulatory Visit (INDEPENDENT_AMBULATORY_CARE_PROVIDER_SITE_OTHER): Payer: Self-pay | Admitting: NURSE PRACTITIONER, FAMILY

## 2022-02-22 NOTE — Telephone Encounter (Signed)
-----   Message from Myer Peer sent at 02/22/2022 10:11 AM EDT -----  Her stomach is better and is back on her 50 mg of metoprolol tartrate

## 2022-03-07 ENCOUNTER — Encounter (INDEPENDENT_AMBULATORY_CARE_PROVIDER_SITE_OTHER): Payer: Self-pay

## 2022-03-26 ENCOUNTER — Observation Stay
Admission: EM | Admit: 2022-03-26 | Discharge: 2022-03-28 | Disposition: A | Discharge status: Home / Self Care | Payer: Medicare PPO | Attending: Family Medicine | Admitting: Family Medicine

## 2022-03-26 ENCOUNTER — Emergency Department (HOSPITAL_COMMUNITY): Payer: Medicare PPO

## 2022-03-26 ENCOUNTER — Observation Stay (HOSPITAL_COMMUNITY): Payer: Medicare PPO | Admitting: Internal Medicine

## 2022-03-26 ENCOUNTER — Other Ambulatory Visit: Payer: Self-pay

## 2022-03-26 ENCOUNTER — Emergency Department (HOSPITAL_COMMUNITY): Payer: Medicare PPO | Admitting: Radiology

## 2022-03-26 ENCOUNTER — Encounter (HOSPITAL_COMMUNITY): Payer: Self-pay

## 2022-03-26 DIAGNOSIS — R55 Syncope and collapse: Principal | ICD-10-CM | POA: Insufficient documentation

## 2022-03-26 DIAGNOSIS — Z7902 Long term (current) use of antithrombotics/antiplatelets: Secondary | ICD-10-CM | POA: Insufficient documentation

## 2022-03-26 DIAGNOSIS — I10 Essential (primary) hypertension: Secondary | ICD-10-CM | POA: Insufficient documentation

## 2022-03-26 DIAGNOSIS — Z87891 Personal history of nicotine dependence: Secondary | ICD-10-CM | POA: Insufficient documentation

## 2022-03-26 DIAGNOSIS — Z20822 Contact with and (suspected) exposure to covid-19: Secondary | ICD-10-CM | POA: Insufficient documentation

## 2022-03-26 DIAGNOSIS — E119 Type 2 diabetes mellitus without complications: Secondary | ICD-10-CM | POA: Insufficient documentation

## 2022-03-26 DIAGNOSIS — R079 Chest pain, unspecified: Secondary | ICD-10-CM | POA: Insufficient documentation

## 2022-03-26 DIAGNOSIS — I2510 Atherosclerotic heart disease of native coronary artery without angina pectoris: Secondary | ICD-10-CM | POA: Diagnosis present

## 2022-03-26 DIAGNOSIS — Z955 Presence of coronary angioplasty implant and graft: Secondary | ICD-10-CM | POA: Insufficient documentation

## 2022-03-26 DIAGNOSIS — I25110 Atherosclerotic heart disease of native coronary artery with unstable angina pectoris: Secondary | ICD-10-CM | POA: Insufficient documentation

## 2022-03-26 DIAGNOSIS — Z7982 Long term (current) use of aspirin: Secondary | ICD-10-CM | POA: Insufficient documentation

## 2022-03-26 DIAGNOSIS — R001 Bradycardia, unspecified: Secondary | ICD-10-CM | POA: Insufficient documentation

## 2022-03-26 DIAGNOSIS — K5730 Diverticulosis of large intestine without perforation or abscess without bleeding: Secondary | ICD-10-CM | POA: Insufficient documentation

## 2022-03-26 DIAGNOSIS — R7989 Other specified abnormal findings of blood chemistry: Secondary | ICD-10-CM | POA: Insufficient documentation

## 2022-03-26 LAB — POC BLOOD GLUCOSE (RESULTS)
GLUCOSE, POC: 122 mg/dl — ABNORMAL HIGH (ref 60–110)
GLUCOSE, POC: 131 mg/dl — ABNORMAL HIGH (ref 60–110)

## 2022-03-26 LAB — BASIC METABOLIC PANEL
ANION GAP: 8 mmol/L (ref 4–13)
BUN/CREA RATIO: 20 (ref 6–22)
BUN: 21 mg/dL (ref 8–25)
CALCIUM: 9.4 mg/dL (ref 8.8–10.2)
CHLORIDE: 95 mmol/L — ABNORMAL LOW (ref 96–111)
CO2 TOTAL: 26 mmol/L (ref 23–31)
CREATININE: 1.03 mg/dL (ref 0.60–1.05)
ESTIMATED GFR: 55 mL/min/BSA — ABNORMAL LOW (ref >=60)
GLUCOSE: 93 mg/dL (ref 65–125)
POTASSIUM: 4.4 mmol/L (ref 3.5–5.1)
SODIUM: 129 mmol/L — ABNORMAL LOW (ref 136–145)

## 2022-03-26 LAB — ECG 12 LEAD
Atrial Rate: 52 {beats}/min
Calculated P Axis: 55 degrees
Calculated R Axis: 29 degrees
Calculated T Axis: 56 degrees
PR Interval: 134 ms
QRS Duration: 94 ms
QT Interval: 486 ms
QTC Calculation: 451 ms
Ventricular rate: 52 {beats}/min

## 2022-03-26 LAB — CBC WITH DIFF
BASOPHIL #: <0.1 10*3/uL (ref <=0.20)
BASOPHIL %: 0 %
EOSINOPHIL #: 0.25 10*3/uL (ref <=0.50)
EOSINOPHIL %: 2 %
HCT: 38.9 % (ref 34.8–46.0)
HGB: 12.6 g/dL (ref 11.5–16.0)
IMMATURE GRANULOCYTE #: 0.23 10*3/uL — ABNORMAL HIGH (ref <0.10)
IMMATURE GRANULOCYTE %: 2 % — ABNORMAL HIGH (ref 0–1)
LYMPHOCYTE #: 3.64 10*3/uL (ref 1.00–4.80)
LYMPHOCYTE %: 26 %
MCH: 29.4 pg (ref 26.0–32.0)
MCHC: 32.4 g/dL (ref 31.0–35.5)
MCV: 90.7 fL (ref 78.0–100.0)
MONOCYTE #: 1.23 10*3/uL — ABNORMAL HIGH (ref 0.20–1.10)
MONOCYTE %: 9 %
MPV: 9 fL (ref 8.7–12.5)
NEUTROPHIL #: 8.79 10*3/uL — ABNORMAL HIGH (ref 1.50–7.70)
NEUTROPHIL %: 61 %
PLATELETS: 400 10*3/uL (ref 150–400)
RBC: 4.29 10*6/uL (ref 3.85–5.22)
RDW-CV: 13.2 % (ref 11.5–15.5)
WBC: 14.2 10*3/uL — ABNORMAL HIGH (ref 3.7–11.0)

## 2022-03-26 LAB — URINALYSIS, MICROSCOPIC

## 2022-03-26 LAB — HEPATIC FUNCTION PANEL
ALBUMIN: 3.5 g/dL (ref 3.4–4.8)
ALKALINE PHOSPHATASE: 64 U/L (ref 55–145)
ALT (SGPT): 13 U/L (ref 8–22)
AST (SGOT): 13 U/L (ref 8–45)
BILIRUBIN DIRECT: 0.2 mg/dL (ref 0.1–0.4)
BILIRUBIN TOTAL: 0.4 mg/dL (ref 0.3–1.3)
PROTEIN TOTAL: 7.1 g/dL (ref 6.0–8.0)

## 2022-03-26 LAB — COVID-19, FLU A/B, RSV RAPID BY PCR
INFLUENZA VIRUS TYPE A: NOT DETECTED
INFLUENZA VIRUS TYPE B: NOT DETECTED
RESPIRATORY SYNCTIAL VIRUS (RSV): NOT DETECTED
SARS-CoV-2: NOT DETECTED

## 2022-03-26 LAB — URINALYSIS, MACROSCOPIC
BILIRUBIN: NEGATIVE mg/dL
BLOOD: NEGATIVE mg/dL
GLUCOSE: NEGATIVE mg/dL
KETONES: NEGATIVE mg/dL
LEUKOCYTES: NEGATIVE WBCs/uL
NITRITE: NEGATIVE
PH: 6 (ref 5.0–8.0)
PROTEIN: NEGATIVE mg/dL
SPECIFIC GRAVITY: 1.01 (ref 1.002–1.030)
UROBILINOGEN: <2 mg/dL (ref <2.0)

## 2022-03-26 LAB — D-DIMER: D-DIMER: 886 ng/mL FEU — ABNORMAL HIGH (ref 0–500)

## 2022-03-26 LAB — RED TOP TUBE

## 2022-03-26 LAB — TROPONIN-I
TROPONIN I: 10 ng/L (ref <=30)
TROPONIN I: <7 ng/L (ref <=30)

## 2022-03-26 LAB — PTT (PARTIAL THROMBOPLASTIN TIME): APTT: 26.4 seconds (ref 23.6–35.7)

## 2022-03-26 LAB — PROCALCITONIN: PROCALCITONIN: <0.02 ng/mL (ref <0.50)

## 2022-03-26 LAB — PT/INR
INR: 1.07 (ref 0.80–1.20)
PROTHROMBIN TIME: 12 seconds (ref 9.2–13.2)

## 2022-03-26 LAB — LIPASE: LIPASE: 109 U/L — ABNORMAL HIGH (ref 10–60)

## 2022-03-26 MED ORDER — FLUTICASONE PROPIONATE 50 MCG/ACTUATION NASAL SPRAY,SUSPENSION
1.0000 | Freq: Every day | NASAL | Status: DC
Start: 2022-03-27 — End: 2022-03-28
  Administered 2022-03-27: 1 via NASAL
  Administered 2022-03-28: 0 via NASAL
  Filled 2022-03-26: qty 16

## 2022-03-26 MED ORDER — AMLODIPINE 5 MG TABLET
5.0000 mg | ORAL_TABLET | Freq: Every day | ORAL | Status: DC
Start: 2022-03-27 — End: 2022-03-28
  Administered 2022-03-27 – 2022-03-28 (×2): 5 mg via ORAL
  Filled 2022-03-26 (×2): qty 1

## 2022-03-26 MED ORDER — PREDNISONE 20 MG TABLET
50.0000 mg | ORAL_TABLET | Freq: Four times a day (QID) | ORAL | Status: AC
Start: 2022-03-26 — End: 2022-03-27
  Administered 2022-03-26 – 2022-03-27 (×3): 50 mg via ORAL
  Filled 2022-03-26 (×3): qty 1

## 2022-03-26 MED ORDER — PANTOPRAZOLE 40 MG TABLET,DELAYED RELEASE
40.0000 mg | DELAYED_RELEASE_TABLET | Freq: Every day | ORAL | Status: DC
Start: 2022-03-27 — End: 2022-03-28
  Administered 2022-03-27 – 2022-03-28 (×2): 40 mg via ORAL
  Filled 2022-03-26 (×2): qty 1

## 2022-03-26 MED ORDER — ACETAMINOPHEN 325 MG TABLET
650.0000 mg | ORAL_TABLET | ORAL | Status: DC | PRN
Start: 2022-03-26 — End: 2022-03-28

## 2022-03-26 MED ORDER — LEVOTHYROXINE 112 MCG TABLET
112.0000 ug | ORAL_TABLET | Freq: Every morning | ORAL | Status: DC
Start: 2022-03-27 — End: 2022-03-28
  Administered 2022-03-27 – 2022-03-28 (×2): 112 ug via ORAL
  Filled 2022-03-26 (×2): qty 1

## 2022-03-26 MED ORDER — LOSARTAN 50 MG TABLET
100.0000 mg | ORAL_TABLET | Freq: Every day | ORAL | Status: DC
Start: 2022-03-27 — End: 2022-03-28
  Administered 2022-03-27 – 2022-03-28 (×2): 100 mg via ORAL
  Filled 2022-03-26 (×2): qty 2

## 2022-03-26 MED ORDER — ONDANSETRON HCL (PF) 4 MG/2 ML INJECTION SOLUTION
4.0000 mg | Freq: Four times a day (QID) | INTRAMUSCULAR | Status: DC | PRN
Start: 2022-03-26 — End: 2022-03-28

## 2022-03-26 MED ORDER — ASPIRIN 81 MG CHEWABLE TABLET
81.0000 mg | CHEWABLE_TABLET | Freq: Every day | ORAL | Status: DC
Start: 2022-03-27 — End: 2022-03-28
  Administered 2022-03-27 – 2022-03-28 (×2): 81 mg via ORAL
  Filled 2022-03-26 (×2): qty 1

## 2022-03-26 MED ORDER — CLOPIDOGREL 75 MG TABLET
75.0000 mg | ORAL_TABLET | Freq: Every day | ORAL | Status: DC
Start: 2022-03-27 — End: 2022-03-28
  Administered 2022-03-27 – 2022-03-28 (×2): 75 mg via ORAL
  Filled 2022-03-26 (×2): qty 1

## 2022-03-26 MED ORDER — SODIUM CHLORIDE 0.9 % (FLUSH) INJECTION SYRINGE
5.0000 mL | INJECTION | INTRAMUSCULAR | Status: DC | PRN
Start: 2022-03-26 — End: 2022-03-28

## 2022-03-26 MED ORDER — INSULIN REGULAR HUMAN 100 UNIT/ML INJECTION SSIP
0.0000 [IU] | INJECTION | Freq: Four times a day (QID) | SUBCUTANEOUS | Status: DC
Start: 2022-03-26 — End: 2022-03-28
  Administered 2022-03-26 – 2022-03-27 (×3): 0 [IU] via SUBCUTANEOUS
  Administered 2022-03-27: 2 [IU] via SUBCUTANEOUS
  Administered 2022-03-27: 0 [IU] via SUBCUTANEOUS
  Administered 2022-03-27: 4 [IU] via SUBCUTANEOUS
  Administered 2022-03-28 (×2): 0 [IU] via SUBCUTANEOUS
  Filled 2022-03-26: qty 12
  Filled 2022-03-26: qty 6

## 2022-03-26 MED ORDER — DEXTROSE 50 % IN WATER (D50W) INTRAVENOUS SYRINGE
25.0000 g | INJECTION | INTRAVENOUS | Status: DC | PRN
Start: 2022-03-26 — End: 2022-03-28

## 2022-03-26 MED ORDER — LINAGLIPTIN 5 MG TABLET
5.0000 mg | ORAL_TABLET | Freq: Every day | ORAL | Status: DC
Start: 2022-03-27 — End: 2022-03-28
  Administered 2022-03-27 – 2022-03-28 (×2): 5 mg via ORAL
  Filled 2022-03-26 (×2): qty 1

## 2022-03-26 MED ORDER — ISOSORBIDE MONONITRATE ER 60 MG TABLET,EXTENDED RELEASE 24 HR
120.0000 mg | ORAL_TABLET | Freq: Every morning | ORAL | Status: DC
Start: 2022-03-27 — End: 2022-03-28
  Administered 2022-03-27 – 2022-03-28 (×2): 120 mg via ORAL
  Filled 2022-03-26 (×3): qty 2

## 2022-03-26 MED ORDER — ASPIRIN 81 MG CHEWABLE TABLET
243.0000 mg | CHEWABLE_TABLET | ORAL | Status: AC
Start: 2022-03-26 — End: 2022-03-26
  Administered 2022-03-26: 243 mg via ORAL
  Filled 2022-03-26: qty 3

## 2022-03-26 MED ORDER — SODIUM CHLORIDE 0.9 % (FLUSH) INJECTION SYRINGE
5.0000 mL | INJECTION | Freq: Two times a day (BID) | INTRAMUSCULAR | Status: DC
Start: 2022-03-26 — End: 2022-03-28
  Administered 2022-03-26 – 2022-03-28 (×4): 5 mL

## 2022-03-26 MED ORDER — RANOLAZINE ER 500 MG TABLET,EXTENDED RELEASE,12 HR
500.0000 mg | ORAL_TABLET | Freq: Two times a day (BID) | ORAL | Status: DC
Start: 2022-03-26 — End: 2022-03-28
  Administered 2022-03-26 – 2022-03-28 (×4): 500 mg via ORAL
  Filled 2022-03-26 (×4): qty 1

## 2022-03-26 MED ORDER — METOPROLOL TARTRATE 50 MG TABLET
50.0000 mg | ORAL_TABLET | Freq: Two times a day (BID) | ORAL | Status: DC
Start: 2022-03-26 — End: 2022-03-28
  Administered 2022-03-26 – 2022-03-28 (×4): 50 mg via ORAL
  Filled 2022-03-26 (×4): qty 1

## 2022-03-26 MED ORDER — DIPHENHYDRAMINE 25 MG CAPSULE
50.0000 mg | ORAL_CAPSULE | Freq: Once | ORAL | Status: AC
Start: 2022-03-27 — End: 2022-03-27
  Administered 2022-03-27: 50 mg via ORAL
  Filled 2022-03-26: qty 2

## 2022-03-26 MED ORDER — ALPRAZOLAM 0.5 MG TABLET
0.5000 mg | ORAL_TABLET | Freq: Every evening | ORAL | Status: DC
Start: 2022-03-26 — End: 2022-03-28
  Administered 2022-03-26 – 2022-03-27 (×2): 0.5 mg via ORAL
  Filled 2022-03-26 (×2): qty 1

## 2022-03-26 MED ORDER — ENOXAPARIN 40 MG/0.4 ML SUB-Q SYRINGE - EAST
40.0000 mg | INJECTION | SUBCUTANEOUS | Status: DC
Start: 2022-03-26 — End: 2022-03-26

## 2022-03-26 NOTE — ED Nurses Note (Signed)
Dr. Owens Shark and medical student at the bedside

## 2022-03-26 NOTE — ED Nurses Note (Signed)
Patient medicated per orders  Pt resting on cot with monitoring devices in place  Call light in reach  Pt denies any needs at this time

## 2022-03-26 NOTE — Care Plan (Signed)
Plan of care initiated.   Problem: Adult Inpatient Plan of Care  Goal: Plan of Care Review  Outcome: Ongoing (see interventions/notes)  Goal: Patient-Specific Goal (Individualized)  Outcome: Ongoing (see interventions/notes)  Flowsheets (Taken 03/26/2022 1520)  Individualized Care Needs: Get home  Anxieties, Fears or Concerns: Anxious about hospitals in general  Patient-Specific Goals (Include Timeframe): Get home  Goal: Absence of Hospital-Acquired Illness or Injury  Outcome: Ongoing (see interventions/notes)  Intervention: Identify and Manage Fall Risk  Recent Flowsheet Documentation  Taken 03/26/2022 1521 by Camille Bal, RN  Safety Promotion/Fall Prevention:   activity supervised   fall prevention program maintained   nonskid shoes/slippers when out of bed   safety round/check completed  Intervention: Prevent Skin Injury  Recent Flowsheet Documentation  Taken 03/26/2022 1521 by Camille Bal, RN  Body Position: sitting  Intervention: Prevent and Manage VTE (Venous Thromboembolism) Risk  Recent Flowsheet Documentation  Taken 03/26/2022 1521 by Camille Bal, RN  VTE Prevention/Management: anticoagulant therapy maintained  Intervention: Prevent Infection  Recent Flowsheet Documentation  Taken 03/26/2022 1521 by Camille Bal, RN  Infection Prevention: promote handwashing  Goal: Optimal Comfort and Wellbeing  Outcome: Ongoing (see interventions/notes)  Intervention: Provide Person-Centered Care  Recent Flowsheet Documentation  Taken 03/26/2022 1521 by Camille Bal, RN  Trust Relationship/Rapport:   care explained   choices provided   emotional support provided   empathic listening provided   questions answered   questions encouraged   reassurance provided   thoughts/feelings acknowledged  Goal: Rounds/Family Conference  Outcome: Ongoing (see interventions/notes)     Problem: Fall Injury Risk  Goal: Absence of Fall and Fall-Related Injury  Outcome: Ongoing (see interventions/notes)  Intervention: Promote Injury-Free  Environment  Recent Flowsheet Documentation  Taken 03/26/2022 1521 by Camille Bal, RN  Safety Promotion/Fall Prevention:   activity supervised   fall prevention program maintained   nonskid shoes/slippers when out of bed   safety round/check completed     Problem: Syncope  Goal: Absence of Syncopal Symptoms  Outcome: Ongoing (see interventions/notes)

## 2022-03-26 NOTE — ED Nurses Note (Signed)
Report provided to [facility] RN   Pt to room 204 via w/c

## 2022-03-26 NOTE — ED Nurses Note (Signed)
Patient ambulatory to restroom with staff

## 2022-03-26 NOTE — ED Provider Notes (Signed)
Triage, Nursing notes, Vitals, and PMH reviewed.    Patient is a 81 y.o.  female presenting to the ED with chief complaint of syncope. Patient reports yesterday in church she developed heavy chest pain and some SOB. She states then she passed out for aprox 10 min. Bystanders reported she didn't have a BP. She states CP continued afterwards throughout the day and the night. This am no CP but feels fatigued. Reports a hx of CAD with stent placed last month at [facility]. 2 prior stents before. Takes Plavix and ASA 81. States did not strike her head, was lowered to the seat. No hx of blood clots.         Physical Exam:   Nursing note and vitals reviewed.Patient is nontoxic.   Constitutional:  Appears well-developed and well-nourished.   Head: Normocephalic and atraumatic.   Eyes: Conjunctivae are normal.EOMI.   Neck: Normal range of motion. Neck supple. No pain with flexion. NTTP  Cardiovascular: Normal rate, regular rhythm and normal heart sounds. No murmur heard.  Pulmonary/Chest: Effort normal and breath sounds normal. No respiratory distress.   Abdominal: Soft. Bowel sounds are normal. Patient exhibits no distension. No tenderness.  Back: No CVA tenderness. No midline TTP or step offs  Musculoskeletal: Normal range of motion. Exhibits no edema and no tenderness.   Neurological: Patient is alert and oriented to person, place, and time.  Skin: Skin is warm and dry.No diaphoresis is noted. No rash  Psychiatric: Patient has a normal mood and affect.       MDM:        Medical Decision Making  Patient presents due to episode of syncope with associated CP. Neuro intact, pain free at present. Concern for ACS, PE, hypotension episode, or other process. Will further evaluate with labs, EKG, CXR.     [place] reviewed and discussed with patient. Trop WNL. However d-dimer is elevated. Patient with syncope and CP, concerning for possible PE. Patient has a contrast allergy, has tolerated well with steroids before and benedryl day of. I  d/w Dr. Manson Passey who has evaluated in ED. Will admit for further.     Elevated d-dimer: acute illness or injury  Syncope, unspecified syncope type: acute illness or injury  Amount and/or Complexity of Data Reviewed  Labs: ordered.  Radiology: ordered.  ECG/medicine tests: ordered.      Risk  OTC drugs.  Decision regarding hospitalization.           Impression: Chest pain, elevated d-dimer, Syncope  Disposition: Admit to hospitalist

## 2022-03-26 NOTE — ED Nurses Note (Signed)
Lab notified of add-on orders.

## 2022-03-26 NOTE — ED Nurses Note (Signed)
Patient assisted back into bed and provided with cardiac meal tray per Dr. Jacqulyn Bath okay  Spouse remains at the bedside  Call light in reach

## 2022-03-26 NOTE — Nurses Notes (Signed)
Patient brought to floor via w/c by ED staff at 1500. Patient oriented to room, ice water provided, VSS and patient denied complaints of chest pain at this time. Patient safety measures in place and call light within reach.   BP (!) 111/96   Pulse 58   Temp 36.3 C (97.4 F)   Resp 18   Ht 1.575 m (5\' 2" )   Wt 75.2 kg (165 lb 11.2 oz)   SpO2 98%   BMI 30.31 kg/m     Unknown Jim, RN  03/26/2022, 15:44

## 2022-03-26 NOTE — ED Nurses Note (Signed)
Radiology at the bedside for portable chest xray  Report received from R. [facility] RN  Care assumed at this time

## 2022-03-26 NOTE — H&P (Signed)
[facility] -- Admission H&P    Name: [name] [name]  Age: 81 y.o.   Gender: female  MRN: [id number]  Date of Admission:  03/26/2022  PCP: Marko Stai, PA-C  Attending: Dorann Ou, DO  Consultants:     Chief Complaint:   Chief Complaint   Patient presents with   . Syncope       HPI:  This is an 81 y.o. female who presented to the ER with complaint of syncope.  Patient reported that day prior to presentation, she was at church and had a syncopal episode.  She reported that prior to event, she did feel flushed.  Per report, was unable to obtain BP during episode.  Patient was out around 10 minutes.  No loss of bowel/bladder or tonic/clonic activity.  Patient did report chest pain prior and after this event, chest pain resolved morning of presentation but patient did complain of generalized fatigue.  She reported some mid-upper abdomen discomfort earlier in the day, resolved at time of exam.  Of note, patient has extensive cardiac history, with 3 stents in the past, last stent placed at [facility] in 01/2022.  She reported she has had URI symptoms for the past few weeks, completed 2 rounds of abx and course of steroids.  Her work-up in ER revealed WBC 14.2, sodium 129 (134), LFTs normal range, lipase 109, D-dimer 886, procalcitonin < 0.02, UA negative, troponin 10 and < 7 on recheck, COVID/flu/RSV not detected, PCXR no infiltrate.  CT abd/pelvis revealed diverticulosis without inflammatory changes, mild asymmetric kidneys right > left, no peripancreatic inflammatory changes or fluid collections, no AAA or obstruction.  Patient received ASA 324 mg PO in ER.  Due to elevated D-dimer, concern for pulmonary embolism, but patient has contrast allergy.  Patient admitted to hospitalist service for further management and evaluation.    History:  Past Medical History:   Diagnosis Date   . Coronary artery disease    . Dyspnea on exertion    . Essential hypertension    . Hyperlipidemia    . PONV (postoperative  nausea and vomiting)    . Stented coronary artery    . Type 2 diabetes mellitus (CMS HCC)          Social History     Substance and Sexual Activity   Drug Use Never     Allergies   Allergen Reactions   . Pneumococcal 23-Valent Polysaccharide Vaccine  Other Adverse Reaction (Add comment)     Severe local swelling   . Tramadol Nausea/ Vomiting     "makes me sick with or without food"   . Iohexol  Other Adverse Reaction (Add comment) and Hives/ Urticaria      Desc: HIVES SEVERAL HRS S/P IV CONTRAST.. [place] LAST SCAN W/ BENADRYL PREMED @ [facility] '01/AC.   Marland Kitchen Lisinopril  Other Adverse Reaction (Add comment)     Other reaction(s): Cough   . Other Rash     Other reaction(s): Other (See Comments)  Nickle   When teeth were wired in she place, patient obtained an infection and wiring had to be removed,  Also allergic to silver   . Crestor [Rosuvastatin]      Muscle aches   . Lovastatin    . Statin [Atorvastatin]      Muscle ache   . Iv Contrast Hives/ Urticaria   . Nickel Hives/ Urticaria     Family Medical History:     Problem Relation (Age of Onset)  Cardiomyopathy Father    Heart Attack Mother    Heart Disease Sister    Uterine Cancer Mother, Niece          Social History     Tobacco Use   . Smoking status: Former     Packs/day: 0.50     Years: 10.00     Pack years: 5.00     Types: Cigarettes   . Smokeless tobacco: Never   Vaping Use   . Vaping Use: Never used   Substance Use Topics   . Alcohol use: Not Currently     Alcohol/week: 1.0 standard drink     Types: 1 Cans of beer per week     Comment: drank long time ago not current.   . Drug use: Never       Review of Systems:  General - No fevers or chills.  Positive for generalized weakness.  Neuro/psych - No acute seizure activity or mood changes.  Refer to HPI.  HEENT/Neck - No acute hearing/vision changes.  No neck stiffness.  CV - Refer to HPI.  Lungs - No shortness of breath or cough.  Abd - No abdomen pain, no nausea/vomiting or change in bowel habits.  GU - No dysuria or  hematuria.  Ext - No new leg swelling.  Skin - No acute skin rashes.  MSK - No acute trauma.  Endo - No polydipsia or polyuria.  Heme/lymph - No easy bruising or bleeding.  No swollen lymph nodes.      Filed Vitals:    03/26/22 1230 03/26/22 1330 03/26/22 1345 03/26/22 1445   BP: (!) 128/53  (!) 129/47 (!) 115/48   Pulse: 56 62 61 60   Resp: 20 17 18 19    Temp:       SpO2: 97% 99% 99% 99%       Physical Examination:  Today's Physical Exam:  Constitutional:  Patient lying in bed, in no acute distress at time of exam  ENT:  ENMT without erythema or injection, mucous membranes moist, No oral lesions.   Neck:  no thyromegaly or lymphadenopathy and supple, symmetrical, trachea midline  Respiratory:  Clear to auscultation bilaterally.   Cardiovascular:  regular rate and rhythm, sinus on telemetry with rate in the 50-60's at time of exam  Gastrointestinal:  Soft, non-tender, Bowel sounds present  Musculoskeletal:  Head atraumatic and normocephalic  Integumentary:  Skin warm and dry and No rashes  Neurologic:  Alert and oriented x3  Psychiatric:  Normal affect, behavior, memory, thought content, judgement, and speech.  Recent Labs/Radiology:  Results for orders placed or performed during the hospital encounter of 03/26/22 (from the past 24 hour(s))   CBC/DIFF    Narrative    The following orders were created for panel order CBC/DIFF.  Procedure                               Abnormality         Status                     ---------                               -----------         ------  CBC WITH MGQQ[[id number]]                Abnormal            Final result                 Please view results for these tests on the individual orders.   BASIC METABOLIC PANEL   Result Value Ref Range    SODIUM 129 (L) 136 - 145 mmol/L    POTASSIUM 4.4 3.5 - 5.1 mmol/L    CHLORIDE 95 (L) 96 - 111 mmol/L    CO2 TOTAL 26 23 - 31 mmol/L    ANION GAP 8 4 - 13 mmol/L    CALCIUM 9.4 8.8 - 10.2 mg/dL    GLUCOSE 93 65 - 671 mg/dL     BUN 21 8 - 25 mg/dL    CREATININE 2.45 8.09 - 1.05 mg/dL    BUN/CREA RATIO 20 6 - 22    ESTIMATED GFR 55 (L) >=60 mL/min/BSA   TROPONIN-I   Result Value Ref Range    TROPONIN I 10 <=30 ng/L   PT/INR   Result Value Ref Range    PROTHROMBIN TIME 12.0 9.2 - 13.2 seconds    INR 1.07 0.80 - 1.20   PTT (PARTIAL THROMBOPLASTIN TIME)   Result Value Ref Range    APTT 26.4 23.6 - 35.7 seconds    Narrative    aPTT THERAPEUTIC RANGE  57.4 - 102.7 seconds                               HEPATIC FUNCTION PANEL   Result Value Ref Range    ALBUMIN 3.5 3.4 - 4.8 g/dL     ALKALINE PHOSPHATASE 64 55 - 145 U/L    ALT (SGPT) 13 8 - 22 U/L    AST (SGOT)  13 8 - 45 U/L    BILIRUBIN TOTAL 0.4 0.3 - 1.3 mg/dL    BILIRUBIN DIRECT 0.2 0.1 - 0.4 mg/dL    PROTEIN TOTAL 7.1 6.0 - 8.0 g/dL   LIPASE   Result Value Ref Range    LIPASE 109 (H) 10 - 60 U/L   URINALYSIS, MACROSCOPIC AND MICROSCOPIC W/CULTURE REFLEX    Specimen: Urine, Site not specified    Narrative    The following orders were created for panel order URINALYSIS, MACROSCOPIC AND MICROSCOPIC W/CULTURE REFLEX.  Procedure                               Abnormality         Status                     ---------                               -----------         ------                     URINALYSIS, MACROSCOPIC[[id number]]      Normal              Final result               URINALYSIS, MICROSCOPIC[[id number]]      Normal  Final result                 Please view results for these tests on the individual orders.   URINALYSIS, MACROSCOPIC   Result Value Ref Range    COLOR Light Yellow Yellow, Light Yellow, Colorless    APPEARANCE Clear Clear    PH 6.0 5.0 - 8.0    LEUKOCYTES Negative Negative WBCs/uL    NITRITE Negative Negative    PROTEIN Negative Negative, 10  mg/dL    GLUCOSE Negative Negative mg/dL    KETONES Negative Negative mg/dL    UROBILINOGEN < 2.0 < 2.0, 1.0, 2.0 , 0.2  mg/dL    BILIRUBIN Negative Negative mg/dL    BLOOD Negative Negative mg/dL    SPECIFIC GRAVITY 0.981  1.002 - 1.030   URINALYSIS, MICROSCOPIC   Result Value Ref Range    RBCS 0-2 0-2, None /hpf    WBCS 0-2 0-2, None /hpf    BACTERIA None None /hpf    SQUAMOUS EPITHELIAL 0-2 0-2, None /hpf    MUCOUS Slight None, Slight /hpf   CBC WITH DIFF   Result Value Ref Range    WBC 14.2 (H) 3.7 - 11.0 x10^3/uL    RBC 4.29 3.85 - 5.22 x10^6/uL    HGB 12.6 11.5 - 16.0 g/dL    HCT 19.1 47.8 - 29.5 %    MCV 90.7 78.0 - 100.0 fL    MCH 29.4 26.0 - 32.0 pg    MCHC 32.4 31.0 - 35.5 g/dL    RDW-CV 62.1 30.8 - 65.7 %    PLATELETS 400 150 - 400 x10^3/uL    MPV 9.0 8.7 - 12.5 fL    NEUTROPHIL % 61 %    LYMPHOCYTE % 26 %    MONOCYTE % 9 %    EOSINOPHIL % 2 %    BASOPHIL % 0 %    NEUTROPHIL # 8.79 (H) 1.50 - 7.70 x10^3/uL    LYMPHOCYTE # 3.64 1.00 - 4.80 x10^3/uL    MONOCYTE # 1.23 (H) 0.20 - 1.10 x10^3/uL    EOSINOPHIL # 0.25 <=0.50 x10^3/uL    BASOPHIL # <0.10 <=0.20 x10^3/uL    IMMATURE GRANULOCYTE % 2 (H) 0 - 1 %    IMMATURE GRANULOCYTE # 0.23 (H) <0.10 x10^3/uL   EXTRA TUBES - STJ    Narrative    The following orders were created for panel order EXTRA TUBES - STJ.  Procedure                               Abnormality         Status                     ---------                               -----------         ------                     RED TOP QION[[id number]]                                     Final result                 Please view  results for these tests on the individual orders.   RED TOP TUBE   Result Value Ref Range    RAINBOW/EXTRA TUBE AUTO RESULT Yes    D-DIMER   Result Value Ref Range    D-DIMER 886 (H) 0 - 500 ng/mL FEU    Narrative    If the D-Dimer result is used to evaluate DVT or PE, the recommended exclusion cutoff value (negative predictive value) is less than 500 ng/ml FEU.       PROCALCITONIN   Result Value Ref Range    PROCALCITONIN <0.02 <0.50 ng/mL   TROPONIN-I   Result Value Ref Range    TROPONIN I <7 <=30 ng/L   COVID-19, FLU A & B, RSV   Result Value Ref Range    SARS-CoV-2 Not Detected Not Detected     INFLUENZA VIRUS TYPE A Not Detected Not Detected    INFLUENZA VIRUS TYPE B Not Detected Not Detected    RESPIRATORY SYNCTIAL VIRUS (RSV) Not Detected Not Detected    Narrative    Results are for the simultaneous qualitative identification of SARS-CoV-2 (formerly 2019-nCoV), Influenza A, Influenza B, and RSV RNA. These etiologic agents are generally detectable in nasopharyngeal and nasal swabs during the ACUTE PHASE of infection. Hence, this test is intended to be performed on respiratory specimens collected from individuals with signs and symptoms of upper respiratory tract infection who meet Centers for Disease Control and Prevention (CDC) clinical and/or epidemiological criteria for Coronavirus Disease 2019 (COVID-19) testing. CDC COVID-19 criteria for testing on human specimens is available at [facility] webpage information for Healthcare Professionals: Coronavirus Disease 2019 (COVID-19) ([email or web address]).     False-negative results may occur if the virus has genomic mutations, insertions, deletions, or rearrangements or if performed very early in the course of illness. Otherwise, negative results indicate virus specific RNA targets are not detected, however negative results do not preclude SARS-CoV-2 infection/COVID-19, Influenza, or Respiratory syncytial virus infection. Results should not be used as the sole basis for patient management decisions. Negative results must be combined with clinical observations, patient history, and epidemiological information. If upper respiratory tract infection is still suspected based on exposure history together with other clinical findings, re-testing should be considered.    Disclaimer:   This assay has been authorized by FDA under an Emergency Use Authorization for use in laboratories certified under the Clinical Laboratory Improvement Amendments of 1988 (CLIA), 42 U.S.C. ([phone], to perform high complexity tests. The impacts of vaccines,  antiviral therapeutics, antibiotics, chemotherapeutic or immunosuppressant drugs have not been evaluated.     Test methodology:   Cepheid Xpert Xpress SARS-CoV-2/Flu/RSV Assay real-time polymerase chain reaction (RT-PCR) test on the GeneXpert Dx and Xpert Xpress systems.     CT ABDOMEN PELVIS WO IV CONTRAST   Final Result   1. Diverticulosis sigmoid colon without pericolonic inflammatory change.   2. Mild asymmetry kidneys, right smaller than left.   3. No intrarenal calculi nor obstructive changes kidneys or ureters.   4. No peripancreatic inflammatory changes or fluid collections.   5. Status post cholecystectomy.   6. No abdominal aortic aneurysm.   7. No bowel obstruction                  The CT exam was performed using one or more the following a dose reduction techniques: Automated exposure control, adjustment of the mA and/or kV according to the patient's size, or use of iterative reconstruction technique.  Radiologist location ID: [id number]         XR AP MOBILE CHEST   Final Result   No infiltrate.                        Radiologist location ID: [id number]             Assessment and Plan:  Syncope  Patient Active Problem List   Diagnosis   . Angina, class III (CMS HCC)   . CAD (coronary artery disease)   . History of percutaneous coronary intervention   . Type 2 diabetes mellitus (CMS HCC)   . Pulmonary HTN (CMS HCC)   . HTN (hypertension)   . Hyperlipidemia   . Hypothyroidism   . Unstable angina (CMS HCC)   . Coronary artery disease   . Syncope   . Elevated d-dimer     Syncope - Continue telemetry, troponins negative.  Will update CT chest tomorrow AM, premeds ordered.  Monitor finger sticks.  Echo 01/20/22 reviewed, no need to repeat at this time.  Of note, patient was increased on Lopressor to 75 mg PO BID, but was intolerant of dosage and is currently back to taking 50 mg PO BID.    Elevated d-dimer - Will premedicate and check CT Chest in AM.    DM2 - Finger sticks, SSI and diabetic  diet.    CAD - Continue home meds.    Home meds for chronic medical conditions.    Dorann OuBartley R Laretha Luepke, DO

## 2022-03-26 NOTE — ED Triage Notes (Signed)
Patient states "i was sitting in church yesterday and i passed out, went blue, and didn't have a pulse or blood pressure"  Pt reports a cardiac stent placed last month  Pt denies chest pain at this time but states "i have been having a funny feeling in my chest every now and then"

## 2022-03-27 ENCOUNTER — Observation Stay (HOSPITAL_COMMUNITY): Payer: Medicare PPO

## 2022-03-27 LAB — CAROTID ARTERY DUPLEX
Left CCA dist dias: 9.5 cm/s
Left CCA dist sys: 96.3 cm/s
Left CCA mid dias: 12.8
Left CCA mid dias: 8.7
Left CCA mid sys: 88.6
Left CCA prox dias: 8.4 cm/s
Left CCA prox sys: 80.9 cm/s
Left Carotid Bubl Sys: 124.1
Left Carotid Bulb Dias: 7.5
Left ECA sys: 170.7 cm/s
Left ICA dist dias: 20.9 cm/s
Left ICA dist sys: 91.9 cm/s
Left ICA mid dias: 18.9 cm/s
Left ICA mid sys: 135.2 cm/s
Left ICA prox dias: 20.9 cm/s
Left ICA prox sys: 147.1 cm/s
Left ICA/CCA sys: 1.5
Left vertebral sys: 54.4 cm/s
Right CCA dist dias: 8.7 cm/s
Right CCA mid sys: 53.7
Right CCA prox dias: 0 cm/s
Right CCA prox sys: 69.1 cm/s
Right Carotid Bulb Dias: 7.6
Right Carotid Bulb Sys: 49.3
Right ICA dist dias: 11.6 cm/s
Right ICA dist sys: 106.3 cm/s
Right ICA mid dias: 15.6 cm/s
Right ICA mid sys: 100.4 cm/s
Right ICA prox dias: 13.7 cm/s
Right ICA prox sys: 108.4 cm/s
Right ICA/CCA sys: 2.4 cm/s
Right cca dist sys: 45 cm/s
Right eca sys: 206.5 cm/s
Right vertebral sys: 52.6 cm/s

## 2022-03-27 LAB — POC BLOOD GLUCOSE (RESULTS)
GLUCOSE, POC: 151 mg/dl — ABNORMAL HIGH (ref 60–110)
GLUCOSE, POC: 229 mg/dl — ABNORMAL HIGH (ref 60–110)
GLUCOSE, POC: 147 mg/dl — ABNORMAL HIGH (ref 60–110)
GLUCOSE, POC: 188 mg/dl — ABNORMAL HIGH (ref 60–110)

## 2022-03-27 LAB — BASIC METABOLIC PANEL
ANION GAP: 10 mmol/L (ref 4–13)
BUN/CREA RATIO: 22 (ref 6–22)
BUN: 25 mg/dL (ref 8–25)
CALCIUM: 9.4 mg/dL (ref 8.8–10.2)
CHLORIDE: 97 mmol/L (ref 96–111)
CO2 TOTAL: 21 mmol/L — ABNORMAL LOW (ref 23–31)
CREATININE: 1.14 mg/dL — ABNORMAL HIGH (ref 0.60–1.05)
ESTIMATED GFR: 48 mL/min/BSA — ABNORMAL LOW (ref >=60)
GLUCOSE: 159 mg/dL — ABNORMAL HIGH (ref 65–125)
POTASSIUM: 4.5 mmol/L (ref 3.5–5.1)
SODIUM: 128 mmol/L — ABNORMAL LOW (ref 136–145)

## 2022-03-27 LAB — CBC WITH DIFF
BASOPHIL #: <0.1 10*3/uL (ref <=0.20)
BASOPHIL %: 0 %
EOSINOPHIL #: <0.1 10*3/uL (ref <=0.50)
EOSINOPHIL %: 0 %
HCT: 37.3 % (ref 34.8–46.0)
HGB: 12.9 g/dL (ref 11.5–16.0)
IMMATURE GRANULOCYTE #: 0.18 10*3/uL — ABNORMAL HIGH (ref <0.10)
IMMATURE GRANULOCYTE %: 1 % (ref 0–1)
LYMPHOCYTE #: 1.22 10*3/uL (ref 1.00–4.80)
LYMPHOCYTE %: 9 %
MCH: 30.9 pg (ref 26.0–32.0)
MCHC: 34.6 g/dL (ref 31.0–35.5)
MCV: 89.2 fL (ref 78.0–100.0)
MONOCYTE #: 0.11 10*3/uL — ABNORMAL LOW (ref 0.20–1.10)
MONOCYTE %: 1 %
MPV: 9 fL (ref 8.7–12.5)
NEUTROPHIL #: 11.65 10*3/uL — ABNORMAL HIGH (ref 1.50–7.70)
NEUTROPHIL %: 89 %
PLATELETS: 381 10*3/uL (ref 150–400)
RBC: 4.18 10*6/uL (ref 3.85–5.22)
RDW-CV: 13.1 % (ref 11.5–15.5)
WBC: 13.2 10*3/uL — ABNORMAL HIGH (ref 3.7–11.0)

## 2022-03-27 LAB — MAGNESIUM: MAGNESIUM: 1.9 mg/dL (ref 1.8–2.6)

## 2022-03-27 LAB — PHOSPHORUS: PHOSPHORUS: 3.3 mg/dL (ref 2.3–4.0)

## 2022-03-27 LAB — THYROXINE, FREE (FREE T4): THYROXINE (T4), FREE: 1.21 ng/dL (ref 0.70–1.25)

## 2022-03-27 LAB — THYROID STIMULATING HORMONE WITH FREE T4 REFLEX: TSH: 0.155 u[IU]/mL — ABNORMAL LOW (ref 0.430–3.550)

## 2022-03-27 MED ORDER — DIPHENHYDRAMINE 25 MG CAPSULE
50.0000 mg | ORAL_CAPSULE | Freq: Once | ORAL | Status: AC
Start: 2022-03-28 — End: 2022-03-28
  Administered 2022-03-28: 50 mg via ORAL
  Filled 2022-03-27: qty 2

## 2022-03-27 MED ORDER — CALCIUM 200 MG (AS CALCIUM CARBONATE 500 MG) CHEWABLE TABLET
500.0000 mg | CHEWABLE_TABLET | Freq: Three times a day (TID) | ORAL | Status: DC | PRN
Start: 2022-03-27 — End: 2022-03-28
  Administered 2022-03-27 (×2): 500 mg via ORAL
  Filled 2022-03-27 (×2): qty 1

## 2022-03-27 MED ORDER — IOPAMIDOL 300 MG IODINE/ML (61 %) INTRAVENOUS SOLUTION
100.0000 mL | INTRAVENOUS | Status: AC
Start: 2022-03-27 — End: 2022-03-27
  Administered 2022-03-27: 65 mL via INTRAVENOUS
  Filled 2022-03-27: qty 100

## 2022-03-27 MED ORDER — PREDNISONE 20 MG TABLET
50.0000 mg | ORAL_TABLET | Freq: Four times a day (QID) | ORAL | Status: AC
Start: 2022-03-27 — End: 2022-03-28
  Administered 2022-03-27 – 2022-03-28 (×3): 50 mg via ORAL
  Filled 2022-03-27 (×3): qty 1

## 2022-03-27 NOTE — Care Plan (Signed)
Patient has been calm and cooperative throughout the shift. Patient resting without any signs of distress at this time. Call light and personal belongings within reach. No acute changes throughout the shift. Safety measures maintained throughout the shift.Clois Comber, RN  03/27/2022, 05:25    Problem: Adult Inpatient Plan of Care  Goal: Plan of Care Review  Outcome: Ongoing (see interventions/notes)  Flowsheets (Taken 03/27/2022 0525)  Progress: no change  Plan of Care Reviewed With: patient  Goal: Patient-Specific Goal (Individualized)  Outcome: Ongoing (see interventions/notes)  Goal: Absence of Hospital-Acquired Illness or Injury  Outcome: Ongoing (see interventions/notes)  Intervention: Identify and Manage Fall Risk  Recent Flowsheet Documentation  Taken 03/26/2022 2100 by Clois Comber, RN  Safety Promotion/Fall Prevention:   activity supervised   fall prevention program maintained   nonskid shoes/slippers when out of bed   safety round/check completed  Goal: Optimal Comfort and Wellbeing  Outcome: Ongoing (see interventions/notes)  Goal: Rounds/Family Conference  Outcome: Ongoing (see interventions/notes)

## 2022-03-27 NOTE — Progress Notes (Signed)
[facility]  Hospitalist -- Progress Note  Name: [name] [name]  Age: 81 y.o.   Gender: female  MRN: [id number]  Date of Admission:  03/26/2022  PCP: Marko Stai, PA-C  Attending: Claria Dice, DO  Consultants:       Subjective:  Patient seen and examined.  She has remained asymptomatic since admission.    Objective:  Filed Vitals:    03/26/22 1445 03/26/22 1520 03/26/22 2025 03/27/22 0545   BP: (!) 115/48 (!) 111/96 (!) 124/48 (!) 145/59   Pulse: 60 58 64 65   Resp: 19 18 18 18    Temp:  36.3 C (97.4 F) 36.7 C (98.1 F) 36.8 C (98.2 F)   SpO2: 99% 98% 96% 96%       I/O last 24 hours:      Intake/Output Summary (Last 24 hours) at 03/27/2022 1043  Last data filed at 03/27/2022 0800  Gross per 24 hour   Intake 1020 ml   Output --   Net 1020 ml     I/O current shift:  04/25 0700 - 04/25 1859  In: 240 [P.O.:240]  Out: -     Tele-Monitoring: reviewed.  NSR with rate in the 60s    Physical Exam:  Today's Physical Exam:  Constitutional:   appears stated age and no distress, lying in bed  ENT:  ENMT without erythema or injection, mucous membranes moist.   Neck:  no thyromegaly or lymphadenopathy and supple, symmetrical, trachea midline  Respiratory:  Clear to auscultation bilaterally.   Cardiovascular:  regular rate and rhythm, S1, S2 normal, no murmur, click, rub or gallop, no carotid bruit on auscultation  Gastrointestinal:  Soft, non-tender, Bowel sounds normal, No masses  Musculoskeletal:  Head atraumatic and normocephalic  Integumentary:  Skin warm and dry and No rashes  Neurologic:  Grossly normal, CN II - XII grossly intact, Alert and oriented x3  Psychiatric:  Normal affect, behavior, memory, thought content, judgement, and speech.    Results for orders placed or performed during the hospital encounter of 03/26/22 (from the past 24 hour(s))   CBC/DIFF    Narrative    The following orders were created for panel order CBC/DIFF.  Procedure                               Abnormality         Status                      ---------                               -----------         ------                     CBC WITH 03/28/22                Abnormal            Final result                 Please view results for these tests on the individual orders.   BASIC METABOLIC PANEL   Result Value Ref Range    SODIUM 129 (L) 136 - 145 mmol/L    POTASSIUM 4.4 3.5 - 5.1 mmol/L    CHLORIDE 95 (L) 96 - 111 mmol/L  CO2 TOTAL 26 23 - 31 mmol/L    ANION GAP 8 4 - 13 mmol/L    CALCIUM 9.4 8.8 - 10.2 mg/dL    GLUCOSE 93 65 - 284 mg/dL    BUN 21 8 - 25 mg/dL    CREATININE 1.32 4.40 - 1.05 mg/dL    BUN/CREA RATIO 20 6 - 22    ESTIMATED GFR 55 (L) >=60 mL/min/BSA   TROPONIN-I   Result Value Ref Range    TROPONIN I 10 <=30 ng/L   PT/INR   Result Value Ref Range    PROTHROMBIN TIME 12.0 9.2 - 13.2 seconds    INR 1.07 0.80 - 1.20   PTT (PARTIAL THROMBOPLASTIN TIME)   Result Value Ref Range    APTT 26.4 23.6 - 35.7 seconds    Narrative    aPTT THERAPEUTIC RANGE  57.4 - 102.7 seconds                               HEPATIC FUNCTION PANEL   Result Value Ref Range    ALBUMIN 3.5 3.4 - 4.8 g/dL     ALKALINE PHOSPHATASE 64 55 - 145 U/L    ALT (SGPT) 13 8 - 22 U/L    AST (SGOT)  13 8 - 45 U/L    BILIRUBIN TOTAL 0.4 0.3 - 1.3 mg/dL    BILIRUBIN DIRECT 0.2 0.1 - 0.4 mg/dL    PROTEIN TOTAL 7.1 6.0 - 8.0 g/dL   LIPASE   Result Value Ref Range    LIPASE 109 (H) 10 - 60 U/L   URINALYSIS, MACROSCOPIC AND MICROSCOPIC W/CULTURE REFLEX    Specimen: Urine, Site not specified    Narrative    The following orders were created for panel order URINALYSIS, MACROSCOPIC AND MICROSCOPIC W/CULTURE REFLEX.  Procedure                               Abnormality         Status                     ---------                               -----------         ------                     URINALYSIS, MACROSCOPIC[[id number]]      Normal              Final result               URINALYSIS, MICROSCOPIC[[id number]]      Normal              Final result                 Please view results for  these tests on the individual orders.   URINALYSIS, MACROSCOPIC   Result Value Ref Range    COLOR Light Yellow Yellow, Light Yellow, Colorless    APPEARANCE Clear Clear    PH 6.0 5.0 - 8.0    LEUKOCYTES Negative Negative WBCs/uL    NITRITE Negative Negative    PROTEIN Negative Negative, 10  mg/dL    GLUCOSE Negative Negative mg/dL    KETONES Negative Negative mg/dL  UROBILINOGEN < 2.0 < 2.0, 1.0, 2.0 , 0.2  mg/dL    BILIRUBIN Negative Negative mg/dL    BLOOD Negative Negative mg/dL    SPECIFIC GRAVITY 0.9811.010 1.002 - 1.030   URINALYSIS, MICROSCOPIC   Result Value Ref Range    RBCS 0-2 0-2, None /hpf    WBCS 0-2 0-2, None /hpf    BACTERIA None None /hpf    SQUAMOUS EPITHELIAL 0-2 0-2, None /hpf    MUCOUS Slight None, Slight /hpf   CBC WITH DIFF   Result Value Ref Range    WBC 14.2 (H) 3.7 - 11.0 x10^3/uL    RBC 4.29 3.85 - 5.22 x10^6/uL    HGB 12.6 11.5 - 16.0 g/dL    HCT 19.138.9 47.834.8 - 29.546.0 %    MCV 90.7 78.0 - 100.0 fL    MCH 29.4 26.0 - 32.0 pg    MCHC 32.4 31.0 - 35.5 g/dL    RDW-CV 62.113.2 30.811.5 - 65.715.5 %    PLATELETS 400 150 - 400 x10^3/uL    MPV 9.0 8.7 - 12.5 fL    NEUTROPHIL % 61 %    LYMPHOCYTE % 26 %    MONOCYTE % 9 %    EOSINOPHIL % 2 %    BASOPHIL % 0 %    NEUTROPHIL # 8.79 (H) 1.50 - 7.70 x10^3/uL    LYMPHOCYTE # 3.64 1.00 - 4.80 x10^3/uL    MONOCYTE # 1.23 (H) 0.20 - 1.10 x10^3/uL    EOSINOPHIL # 0.25 <=0.50 x10^3/uL    BASOPHIL # <0.10 <=0.20 x10^3/uL    IMMATURE GRANULOCYTE % 2 (H) 0 - 1 %    IMMATURE GRANULOCYTE # 0.23 (H) <0.10 x10^3/uL   EXTRA TUBES - STJ    Narrative    The following orders were created for panel order EXTRA TUBES - STJ.  Procedure                               Abnormality         Status                     ---------                               -----------         ------                     RED TOP QION[[id number]]TUBE[[id number]]                                     Final result                 Please view results for these tests on the individual orders.   RED TOP TUBE   Result Value Ref Range     RAINBOW/EXTRA TUBE AUTO RESULT Yes    D-DIMER   Result Value Ref Range    D-DIMER 886 (H) 0 - 500 ng/mL FEU    Narrative    If the D-Dimer result is used to evaluate DVT or PE, the recommended exclusion cutoff value (negative predictive value) is less than 500 ng/ml FEU.       PROCALCITONIN   Result Value Ref Range    PROCALCITONIN <0.02 <0.50 ng/mL  TROPONIN-I   Result Value Ref Range    TROPONIN I <7 <=30 ng/L   COVID-19, FLU A & B, RSV   Result Value Ref Range    SARS-CoV-2 Not Detected Not Detected    INFLUENZA VIRUS TYPE A Not Detected Not Detected    INFLUENZA VIRUS TYPE B Not Detected Not Detected    RESPIRATORY SYNCTIAL VIRUS (RSV) Not Detected Not Detected    Narrative    Results are for the simultaneous qualitative identification of SARS-CoV-2 (formerly 2019-nCoV), Influenza A, Influenza B, and RSV RNA. These etiologic agents are generally detectable in nasopharyngeal and nasal swabs during the ACUTE PHASE of infection. Hence, this test is intended to be performed on respiratory specimens collected from individuals with signs and symptoms of upper respiratory tract infection who meet Centers for Disease Control and Prevention (CDC) clinical and/or epidemiological criteria for Coronavirus Disease 2019 (COVID-19) testing. CDC COVID-19 criteria for testing on human specimens is available at [facility] webpage information for Healthcare Professionals: Coronavirus Disease 2019 (COVID-19) ([email or web address]).     False-negative results may occur if the virus has genomic mutations, insertions, deletions, or rearrangements or if performed very early in the course of illness. Otherwise, negative results indicate virus specific RNA targets are not detected, however negative results do not preclude SARS-CoV-2 infection/COVID-19, Influenza, or Respiratory syncytial virus infection. Results should not be used as the sole basis for patient management decisions. Negative results must be  combined with clinical observations, patient history, and epidemiological information. If upper respiratory tract infection is still suspected based on exposure history together with other clinical findings, re-testing should be considered.    Disclaimer:   This assay has been authorized by FDA under an Emergency Use Authorization for use in laboratories certified under the Clinical Laboratory Improvement Amendments of 1988 (CLIA), 42 U.S.C. [phone], to perform high complexity tests. The impacts of vaccines, antiviral therapeutics, antibiotics, chemotherapeutic or immunosuppressant drugs have not been evaluated.     Test methodology:   Cepheid Xpert Xpress SARS-CoV-2/Flu/RSV Assay real-time polymerase chain reaction (RT-PCR) test on the GeneXpert Dx and Xpert Xpress systems.   CBC/DIFF    Narrative    The following orders were created for panel order CBC/DIFF.  Procedure                               Abnormality         Status                     ---------                               -----------         ------                     CBC WITH RUEA[[id number]]                Abnormal            Final result                 Please view results for these tests on the individual orders.   BASIC METABOLIC PANEL, NON-FASTING   Result Value Ref Range    SODIUM 128 (L) 136 - 145 mmol/L    POTASSIUM 4.5 3.5 - 5.1 mmol/L    CHLORIDE 97 96 - 111 mmol/L  CO2 TOTAL 21 (L) 23 - 31 mmol/L    ANION GAP 10 4 - 13 mmol/L    CALCIUM 9.4 8.8 - 10.2 mg/dL    GLUCOSE 782 (H) 65 - 125 mg/dL    BUN 25 8 - 25 mg/dL    CREATININE 9.56 (H) 0.60 - 1.05 mg/dL    BUN/CREA RATIO 22 6 - 22    ESTIMATED GFR 48 (L) >=60 mL/min/BSA    Narrative    Hemolysis can alter results at this level (slight).   PHOSPHORUS - AM ONCE   Result Value Ref Range    PHOSPHORUS 3.3 2.3 - 4.0 mg/dL    Narrative    Hemolysis can alter results at this level (slight).   MAGNESIUM - AM ONCE   Result Value Ref Range    MAGNESIUM 1.9 1.8 - 2.6 mg/dL    Narrative    Hemolysis can  alter results at this level (slight).   THYROID STIMULATING HORMONE WITH FREE T4 REFLEX   Result Value Ref Range    TSH 0.155 (L) 0.430 - 3.550 uIU/mL   CBC WITH DIFF   Result Value Ref Range    WBC 13.2 (H) 3.7 - 11.0 x10^3/uL    RBC 4.18 3.85 - 5.22 x10^6/uL    HGB 12.9 11.5 - 16.0 g/dL    HCT 21.3 08.6 - 57.8 %    MCV 89.2 78.0 - 100.0 fL    MCH 30.9 26.0 - 32.0 pg    MCHC 34.6 31.0 - 35.5 g/dL    RDW-CV 46.9 62.9 - 52.8 %    PLATELETS 381 150 - 400 x10^3/uL    MPV 9.0 8.7 - 12.5 fL    NEUTROPHIL % 89 %    LYMPHOCYTE % 9 %    MONOCYTE % 1 %    EOSINOPHIL % 0 %    BASOPHIL % 0 %    NEUTROPHIL # 11.65 (H) 1.50 - 7.70 x10^3/uL    LYMPHOCYTE # 1.22 1.00 - 4.80 x10^3/uL    MONOCYTE # 0.11 (L) 0.20 - 1.10 x10^3/uL    EOSINOPHIL # <0.10 <=0.50 x10^3/uL    BASOPHIL # <0.10 <=0.20 x10^3/uL    IMMATURE GRANULOCYTE % 1 0 - 1 %    IMMATURE GRANULOCYTE # 0.18 (H) <0.10 x10^3/uL   THYROXINE, FREE (FREE T4)   Result Value Ref Range    THYROXINE (T4), FREE 1.21 0.70 - 1.25 ng/dL   POC BLOOD GLUCOSE (RESULTS)   Result Value Ref Range    GLUCOSE, POC 122 (H) 60 - 110 mg/dl   POC BLOOD GLUCOSE (RESULTS)   Result Value Ref Range    GLUCOSE, POC 131 (H) 60 - 110 mg/dl   POC BLOOD GLUCOSE (RESULTS)   Result Value Ref Range    GLUCOSE, POC 151 (H) 60 - 110 mg/dl         Assessment and Plan:  Syncope  Patient Active Problem List   Diagnosis   . Angina, class III (CMS HCC)   . CAD (coronary artery disease)   . History of percutaneous coronary intervention   . Type 2 diabetes mellitus (CMS HCC)   . Pulmonary HTN (CMS HCC)   . HTN (hypertension)   . Hyperlipidemia   . Hypothyroidism   . Unstable angina (CMS HCC)   . Coronary artery disease   . Syncope   . Elevated d-dimer         Orders Placed This Encounter   . XR AP MOBILE CHEST   .  CT ABDOMEN PELVIS WO IV CONTRAST   . CT ANGIO CHEST FOR PULMONARY EMBOLUS W IV CONTRAST   . CT BRAIN WO IV CONTRAST   . CBC/DIFF   . BASIC METABOLIC PANEL   . TROPONIN-I   . PT/INR   . PTT (PARTIAL  THROMBOPLASTIN TIME)   . HEPATIC FUNCTION PANEL   . LIPASE   . URINALYSIS, MACROSCOPIC AND MICROSCOPIC W/CULTURE REFLEX   . URINALYSIS, MACROSCOPIC   . URINALYSIS, MICROSCOPIC   . CBC WITH DIFF   . EXTRA TUBES - STJ   . RED TOP TUBE   . D-DIMER   . PROCALCITONIN   . TROPONIN-I   . COVID-19, FLU A & B, RSV   . CBC/DIFF   . BASIC METABOLIC PANEL, NON-FASTING   . PHOSPHORUS - AM ONCE   . MAGNESIUM - AM ONCE   . THYROID STIMULATING HORMONE WITH FREE T4 REFLEX   . CBC WITH DIFF   . THYROXINE, FREE (FREE T4)   . ECG 12 LEAD   . PERFORM POC WHOLE BLOOD GLUCOSE   . INSERT & MAINTAIN PERIPHERAL IV ACCESS   . PERIPHERAL IV DRESSING CHANGE   . PATIENT CLASS/LEVEL OF CARE DESIGNATION - STJ   . CAROTID ARTERY DUPLEX   . aspirin chewable tablet 243 mg   . AND Linked Order Group    . NS flush syringe    . NS flush syringe   . dextrose 50% (0.5 g/mL) injection - syringe   . SSIP insulin R human (HUMULIN R) 100 units/mL injection   . acetaminophen (TYLENOL) tablet   . ondansetron (ZOFRAN) 2 mg/mL injection   . predniSONE (DELTASONE) tablet 50 mg   . diphenhydrAMINE (BENADRYL) capsule   . losartan (COZAAR) tablet   . ALPRAZolam Prudy Feeler) tablet   . ranolazine (RANEXA) extended release tablet   . linagliptin (TRADJENTA) tablet   . metoprolol tartrate (LOPRESSOR) tablet   . amLODIPine (NORVASC) tablet   . fluticasone (FLONASE) 50 mcg per spray nasal spray   . aspirin chewable tablet 81 mg   . clopidogrel (PLAVIX) 75 mg tablet   . pantoprazole (PROTONIX) delayed release tablet   . levothyroxine (SYNTHROID) tablet   . isosorbide mononitrate (IMDUR) 24 hr extended release tablet   . iopamidol (ISOVUE-300) 61% infusion     Active Orders   Diet    DIET DIABETIC Calorie Amount: CC 1800     Frequency: All Meals     Number of Occurrences: 1 Occurrences   Nursing    ACTIVITY Activity: OOB WITH ASSISTANCE     Frequency: UNTIL DISCONTINUED     Number of Occurrences: Until Specified    Notify MD Vital Signs     Frequency: PRN     Number of  Occurrences: Until Specified    PT IS MEDIUM RISK FOR VENOUS THROMBOEMBOLISM     Frequency: CONTINUOUS     Number of Occurrences: Until Specified    PULSE OXIMETRY QSHIFT     Frequency: QSHIFT     Number of Occurrences: Until Specified    TELEMETRY - Patient may NOT leave unit w/o monitoring     Frequency: CONTINUOUS     Number of Occurrences: Until Specified    VITAL SIGNS QSHIFT     Frequency: QSHIFT     Number of Occurrences: Until Specified   Code Status    FULL CODE     Frequency: CONTINUOUS     Number of Occurrences: Until Specified   Point of Care Testing  PERFORM POC WHOLE BLOOD GLUCOSE     Frequency: TID AC & HS     Number of Occurrences: Until Specified   IV    INSERT & MAINTAIN PERIPHERAL IV ACCESS     Linked Order: And     Frequency: UNTIL DISCONTINUED     Number of Occurrences: Until Specified    PERIPHERAL IV DRESSING CHANGE     Linked Order: And     Frequency: PRN     Number of Occurrences: Until Specified   Vascular Ultrasound    CAROTID ARTERY DUPLEX     Frequency: ONE TIME     Number of Occurrences: 1 Occurrences     Scheduling Instructions:               Medications    acetaminophen (TYLENOL) tablet     Frequency: Q4H PRN     Dose: 650 mg     Route: Oral    ALPRAZolam (XANAX) tablet     Frequency: QPM     Dose: 0.5 mg     Route: Oral    amLODIPine (NORVASC) tablet     Frequency: Daily     Dose: 5 mg     Route: Oral    aspirin chewable tablet 81 mg     Frequency: Daily     Dose: 81 mg     Route: Oral    clopidogrel (PLAVIX) 75 mg tablet     Frequency: Daily     Dose: 75 mg     Route: Oral    dextrose 50% (0.5 g/mL) injection - syringe     Frequency: Q15 Min PRN     Dose: 25 g     Route: Intravenous    fluticasone (FLONASE) 50 mcg per spray nasal spray     Frequency: Daily     Dose: 1 Spray     Route: Each Nostril    isosorbide mononitrate (IMDUR) 24 hr extended release tablet     Frequency: QAM     Dose: 120 mg     Route: Oral    levothyroxine (SYNTHROID) tablet     Frequency: QAM     Dose: 112  mcg     Route: Oral    linagliptin (TRADJENTA) tablet     Frequency: Daily     Dose: 5 mg     Route: Oral    losartan (COZAAR) tablet     Frequency: Daily     Dose: 100 mg     Route: Oral    metoprolol tartrate (LOPRESSOR) tablet     Frequency: 2x/day     Dose: 50 mg     Route: Oral    NS flush syringe     Linked Order: And     Frequency: 2x/day     Dose: 5 mL     Route: Intracatheter    NS flush syringe     Linked Order: And     Frequency: Q1 MIN PRN     Dose: 5 mL     Route: Intracatheter    ondansetron (ZOFRAN) 2 mg/mL injection     Frequency: Q6H PRN     Dose: 4 mg     Route: Intravenous    pantoprazole (PROTONIX) delayed release tablet     Frequency: Daily     Dose: 40 mg     Route: Oral    ranolazine (RANEXA) extended release tablet     Frequency: 2x/day     Dose: 500 mg  Route: Oral    SSIP insulin R human (HUMULIN R) 100 units/mL injection     Frequency: 4x/day AC     Dose: 0-6 Units     Route: Subcutaneous     -syncope:  Patient asymptomatic since admission.  Will obtain carotid ultrasound    -elevated D-dimer:  CTA negative for pulmonary embolus      Claria Dice, DO

## 2022-03-27 NOTE — Care Plan (Signed)
Problem: Adult Inpatient Plan of Care  Goal: Rounds/Family Conference  Outcome: Ongoing (see interventions/notes)  Flowsheets (Taken 03/27/2022 0858)  Participants:   nursing   occupational therapy   pharmacy   physical therapy   physician   social work/services   speech language pathology   Monitoring telemetry and labs.  Discussed Antibiotic, VTE appropriateness, and evaluated completion of home medications.

## 2022-03-27 NOTE — Nurses Notes (Signed)
Dr. M Jackson in to see pt.

## 2022-03-27 NOTE — Ancillary Notes (Signed)
Discharge planning with pt.  Pt lives with her husband and states that she is typically independent.  Pt has no reported problems with transportation, obtaining medications, food or housing insecurity, mental health or substance abuse.  Pt denies DME needs. Pt declines home health or rehab referrals.  Pt anticipates no d/c needs.

## 2022-03-27 NOTE — Care Plan (Signed)
Plan of care reviewed. Alert and oriented x4. VSS. No c/o chest pain, dizziness, n/v, or pain this shift. Blood glucose monitored and insulin given per MAR. Medications given per [facility] without difficulty. Monitored labs. Safety measures checked and maintained throughout shift. Will continue to monitor.       Problem: Adult Inpatient Plan of Care  Goal: Plan of Care Review  03/27/2022 1725 by Manon Hilding, LPN  Outcome: Ongoing (see interventions/notes)  03/27/2022 1724 by Manon Hilding, LPN  Outcome: Ongoing (see interventions/notes)  Goal: Patient-Specific Goal (Individualized)  03/27/2022 1725 by Manon Hilding, LPN  Outcome: Ongoing (see interventions/notes)  03/27/2022 1724 by Manon Hilding, LPN  Outcome: Ongoing (see interventions/notes)  Goal: Absence of Hospital-Acquired Illness or Injury  03/27/2022 1725 by Manon Hilding, LPN  Outcome: Ongoing (see interventions/notes)  03/27/2022 1724 by Manon Hilding, LPN  Outcome: Next Review (LTC Units Only)  Intervention: Identify and Manage Fall Risk  Recent Flowsheet Documentation  Taken 03/27/2022 0750 by Manon Hilding, LPN  Safety Promotion/Fall Prevention:   activity supervised   safety round/check completed   nonskid shoes/slippers when out of bed  Intervention: Prevent Skin Injury  Recent Flowsheet Documentation  Taken 03/27/2022 0750 by Manon Hilding, LPN  Body Position: sitting  Skin Protection:   adhesive use limited   incontinence pads utilized   transparent dressing maintained   tubing/devices free from skin contact  Intervention: Prevent and Manage VTE (Venous Thromboembolism) Risk  Recent Flowsheet Documentation  Taken 03/27/2022 0750 by Manon Hilding, LPN  VTE Prevention/Management:   ambulation promoted   anticoagulant therapy maintained  Goal: Optimal Comfort and Wellbeing  03/27/2022 1725 by Manon Hilding, LPN  Outcome: Ongoing (see interventions/notes)  03/27/2022 1724 by Manon Hilding, LPN  Outcome: Next Review (LTC Units Only)  Goal: Rounds/Family Conference  03/27/2022  1725 by Manon Hilding, LPN  Outcome: Ongoing (see interventions/notes)  03/27/2022 1724 by Manon Hilding, LPN  Outcome: Next Review (LTC Units Only)     Problem: Fall Injury Risk  Goal: Absence of Fall and Fall-Related Injury  03/27/2022 1725 by Manon Hilding, LPN  Outcome: Ongoing (see interventions/notes)  03/27/2022 1724 by Manon Hilding, LPN  Outcome: Next Review (LTC Units Only)  Intervention: Promote Injury-Free Environment  Recent Flowsheet Documentation  Taken 03/27/2022 0750 by Manon Hilding, LPN  Safety Promotion/Fall Prevention:   activity supervised   safety round/check completed   nonskid shoes/slippers when out of bed     Problem: Syncope  Goal: Absence of Syncopal Symptoms  03/27/2022 1725 by Manon Hilding, LPN  Outcome: Ongoing (see interventions/notes)  03/27/2022 1724 by Manon Hilding, LPN  Outcome: Next Review (LTC Units Only)

## 2022-03-28 ENCOUNTER — Other Ambulatory Visit (INDEPENDENT_AMBULATORY_CARE_PROVIDER_SITE_OTHER): Payer: Self-pay | Admitting: Family Medicine

## 2022-03-28 ENCOUNTER — Observation Stay (HOSPITAL_COMMUNITY): Payer: Medicare PPO

## 2022-03-28 DIAGNOSIS — I6523 Occlusion and stenosis of bilateral carotid arteries: Secondary | ICD-10-CM

## 2022-03-28 LAB — BASIC METABOLIC PANEL
ANION GAP: 10 mmol/L (ref 4–13)
BUN/CREA RATIO: 24 — ABNORMAL HIGH (ref 6–22)
BUN: 24 mg/dL (ref 8–25)
CALCIUM: 10.1 mg/dL (ref 8.8–10.2)
CHLORIDE: 101 mmol/L (ref 96–111)
CO2 TOTAL: 24 mmol/L (ref 23–31)
CREATININE: 1.01 mg/dL (ref 0.60–1.05)
ESTIMATED GFR: 56 mL/min/BSA — ABNORMAL LOW (ref >=60)
GLUCOSE: 154 mg/dL — ABNORMAL HIGH (ref 65–125)
POTASSIUM: 4.7 mmol/L (ref 3.5–5.1)
SODIUM: 135 mmol/L — ABNORMAL LOW (ref 136–145)

## 2022-03-28 LAB — POC BLOOD GLUCOSE (RESULTS)
GLUCOSE, POC: 158 mg/dl — ABNORMAL HIGH (ref 60–110)
GLUCOSE, POC: 130 mg/dl — ABNORMAL HIGH (ref 60–110)

## 2022-03-28 LAB — CBC WITH DIFF
BASOPHIL #: <0.1 10*3/uL (ref <=0.20)
BASOPHIL %: 0 %
EOSINOPHIL #: <0.1 10*3/uL (ref <=0.50)
EOSINOPHIL %: 0 %
HCT: 38.4 % (ref 34.8–46.0)
HGB: 12.9 g/dL (ref 11.5–16.0)
IMMATURE GRANULOCYTE #: 0.25 10*3/uL — ABNORMAL HIGH (ref <0.10)
IMMATURE GRANULOCYTE %: 1 % (ref 0–1)
LYMPHOCYTE #: 1.39 10*3/uL (ref 1.00–4.80)
LYMPHOCYTE %: 7 %
MCH: 29.9 pg (ref 26.0–32.0)
MCHC: 33.6 g/dL (ref 31.0–35.5)
MCV: 89.1 fL (ref 78.0–100.0)
MONOCYTE #: 0.28 10*3/uL (ref 0.20–1.10)
MONOCYTE %: 2 %
NEUTROPHIL #: 16.72 10*3/uL — ABNORMAL HIGH (ref 1.50–7.70)
NEUTROPHIL %: 90 %
PLATELETS: 388 10*3/uL (ref 150–400)
RBC: 4.31 10*6/uL (ref 3.85–5.22)
RDW-CV: 13.2 % (ref 11.5–15.5)
WBC: 18.7 10*3/uL — ABNORMAL HIGH (ref 3.7–11.0)

## 2022-03-28 LAB — PROCALCITONIN: PROCALCITONIN: <0.02 ng/mL (ref <0.50)

## 2022-03-28 MED ORDER — IOPAMIDOL 370 MG IODINE/ML (76 %) INTRAVENOUS SOLUTION
100.0000 mL | INTRAVENOUS | Status: AC
Start: 2022-03-28 — End: 2022-03-28
  Administered 2022-03-28: 85 mL via INTRAVENOUS

## 2022-03-28 NOTE — Care Plan (Signed)
Patient has been calm and cooperative throughout the shift. Patient resting without any signs of distress at this time. Call light and personal belongings within reach. No acute changes throughout the shift. Safety measures maintained throughout the shift.Clois Comber, RN  03/28/2022, 04:33     Problem: Adult Inpatient Plan of Care  Goal: Plan of Care Review  Outcome: Ongoing (see interventions/notes)  Flowsheets (Taken 03/28/2022 0432)  Progress: no change  Plan of Care Reviewed With: patient  Goal: Patient-Specific Goal (Individualized)  Outcome: Ongoing (see interventions/notes)  Goal: Absence of Hospital-Acquired Illness or Injury  Outcome: Ongoing (see interventions/notes)  Intervention: Identify and Manage Fall Risk  Recent Flowsheet Documentation  Taken 03/27/2022 2000 by Clois Comber, RN  Safety Promotion/Fall Prevention:   activity supervised   fall prevention program maintained   nonskid shoes/slippers when out of bed   safety round/check completed  Intervention: Prevent Skin Injury  Recent Flowsheet Documentation  Taken 03/27/2022 2000 by Clois Comber, RN  Skin Protection: adhesive use limited  Intervention: Prevent and Manage VTE (Venous Thromboembolism) Risk  Recent Flowsheet Documentation  Taken 03/27/2022 2000 by Clois Comber, RN  VTE Prevention/Management:   ambulation promoted   anticoagulant therapy maintained  Goal: Optimal Comfort and Wellbeing  Outcome: Ongoing (see interventions/notes)  Intervention: Provide Person-Centered Care  Recent Flowsheet Documentation  Taken 03/27/2022 2000 by Clois Comber, [place] Relationship/Rapport:   care explained   choices provided   emotional support provided   empathic listening provided   questions answered   questions encouraged   reassurance provided   thoughts/feelings acknowledged  Goal: Rounds/Family Conference  Outcome: Ongoing (see interventions/notes)

## 2022-03-28 NOTE — Progress Notes (Signed)
[facility] -- Progress Note  Name: [name] [name]  Age: 81 y.o.   Gender: female  MRN: [id number]  Date of Admission:  03/26/2022  PCP: Harriette Bouillon, PA-C  Attending: Yolanda Bonine, DO  Consultants:       Subjective:  Patient seen and examined.  No further syncopal episodes.  Patient at baseline.    Objective:  Filed Vitals:    03/27/22 0545 03/27/22 1300 03/27/22 2007 03/28/22 0538   BP: (!) 145/59 (!) 111/47 (!) 122/58 (!) 148/62   Pulse: 65 65 69 64   Resp: 18 18 18 18    Temp: 36.8 C (98.2 F) 37 C (98.6 F) 36.7 C (98.1 F) 36.7 C (98 F)   SpO2: 96% 96% 96% 97%       I/O last 24 hours:      Intake/Output Summary (Last 24 hours) at 03/28/2022 1015  Last data filed at 03/28/2022 0800  Gross per 24 hour   Intake 1520 ml   Output --   Net 1520 ml     I/O current shift:  04/26 0700 - 04/26 1859  In: 240 [P.O.:240]  Out: -     Tele-Monitoring: reviewed.  NSR with rate in the 60s    Physical Exam:  Today's Physical Exam:  Constitutional:   appears stated age and no distress, lying in bed  ENT:  ENMT without erythema or injection, mucous membranes moist.   Respiratory:  Clear to auscultation bilaterally.   Cardiovascular:  regular rate and rhythm, S1, S2 normal, no murmur, click, rub or gallop, no carotid bruit on auscultation  Gastrointestinal:  Soft, non-tender, Bowel sounds normal, No masses  Musculoskeletal:  Head atraumatic and normocephalic  Integumentary:  Skin warm and dry and No rashes  Neurologic:  Grossly normal, CN II - XII grossly intact, Alert and oriented x3  Psychiatric:  Normal affect, behavior, memory, thought content, judgement, and speech.    Results for orders placed or performed during the hospital encounter of 03/26/22 (from the past 24 hour(s))   CBC/DIFF    Narrative    The following orders were created for panel order CBC/DIFF.  Procedure                               Abnormality         Status                     ---------                               -----------          ------                     CBC WITH [id number]                Abnormal            Final result                 Please view results for these tests on the individual orders.   BASIC METABOLIC PANEL - AM ONCE   Result Value Ref Range    SODIUM 135 (L) 136 - 145 mmol/L    POTASSIUM 4.7 3.5 - 5.1 mmol/L    CHLORIDE 101 96 - 111 mmol/L    CO2 TOTAL 24 23 -  31 mmol/L    ANION GAP 10 4 - 13 mmol/L    CALCIUM 10.1 8.8 - 10.2 mg/dL    GLUCOSE 154 (H) 65 - 125 mg/dL    BUN 24 8 - 25 mg/dL    CREATININE 1.01 0.60 - 1.05 mg/dL    BUN/CREA RATIO 24 (H) 6 - 22    ESTIMATED GFR 56 (L) >=60 mL/min/BSA    Narrative    Hemolysis can alter results at this level (slight).   CBC WITH DIFF   Result Value Ref Range    WBC 18.7 (H) 3.7 - 11.0 x10^3/uL    RBC 4.31 3.85 - 5.22 x10^6/uL    HGB 12.9 11.5 - 16.0 g/dL    HCT 38.4 34.8 - 46.0 %    MCV 89.1 78.0 - 100.0 fL    MCH 29.9 26.0 - 32.0 pg    MCHC 33.6 31.0 - 35.5 g/dL    RDW-CV 13.2 11.5 - 15.5 %    PLATELETS 388 150 - 400 x10^3/uL    NEUTROPHIL % 90 %    LYMPHOCYTE % 7 %    MONOCYTE % 2 %    EOSINOPHIL % 0 %    BASOPHIL % 0 %    NEUTROPHIL # 16.72 (H) 1.50 - 7.70 x10^3/uL    LYMPHOCYTE # 1.39 1.00 - 4.80 x10^3/uL    MONOCYTE # 0.28 0.20 - 1.10 x10^3/uL    EOSINOPHIL # <0.10 <=0.50 x10^3/uL    BASOPHIL # <0.10 <=0.20 x10^3/uL    IMMATURE GRANULOCYTE % 1 0 - 1 %    IMMATURE GRANULOCYTE # 0.25 (H) <0.10 x10^3/uL    Narrative    Mean Platelet Volume - Not Reported   POC BLOOD GLUCOSE (RESULTS)   Result Value Ref Range    GLUCOSE, POC 229 (H) 60 - 110 mg/dl   POC BLOOD GLUCOSE (RESULTS)   Result Value Ref Range    GLUCOSE, POC 147 (H) 60 - 110 mg/dl   POC BLOOD GLUCOSE (RESULTS)   Result Value Ref Range    GLUCOSE, POC 188 (H) 60 - 110 mg/dl   POC BLOOD GLUCOSE (RESULTS)   Result Value Ref Range    GLUCOSE, POC 158 (H) 60 - 110 mg/dl         Assessment and Plan:  Syncope  Patient Active Problem List   Diagnosis   . Angina, class III (CMS HCC)   . CAD (coronary artery disease)   .  History of percutaneous coronary intervention   . Type 2 diabetes mellitus (CMS HCC)   . Pulmonary HTN (CMS HCC)   . HTN (hypertension)   . Hyperlipidemia   . Hypothyroidism   . Unstable angina (CMS HCC)   . Coronary artery disease   . Syncope   . Elevated d-dimer         Orders Placed This Encounter   . XR AP MOBILE CHEST   . CT ABDOMEN PELVIS WO IV CONTRAST   . CT ANGIO CHEST FOR PULMONARY EMBOLUS W IV CONTRAST   . CT BRAIN WO IV CONTRAST   . CT ANGIO CAROTID-EXTRACRANIAL (NECK) W IV CONTRAST   . CBC/DIFF   . BASIC METABOLIC PANEL   . TROPONIN-I   . PT/INR   . PTT (PARTIAL THROMBOPLASTIN TIME)   . HEPATIC FUNCTION PANEL   . LIPASE   . URINALYSIS, MACROSCOPIC AND MICROSCOPIC W/CULTURE REFLEX   . URINALYSIS, MACROSCOPIC   . URINALYSIS, MICROSCOPIC   . CBC WITH DIFF   .  EXTRA TUBES - [facility]   . RED TOP TUBE   . D-DIMER   . PROCALCITONIN   . TROPONIN-I   . COVID-19, FLU A & B, RSV   . CBC/DIFF   . BASIC METABOLIC PANEL, NON-FASTING   . PHOSPHORUS - AM ONCE   . MAGNESIUM - AM ONCE   . THYROID STIMULATING HORMONE WITH FREE T4 REFLEX   . CBC WITH DIFF   . THYROXINE, FREE (FREE T4)   . CBC/DIFF   . BASIC METABOLIC PANEL - AM ONCE   . CBC WITH DIFF   . ECG 12 LEAD   . PERFORM POC WHOLE BLOOD GLUCOSE   . INSERT & MAINTAIN PERIPHERAL IV ACCESS   . PERIPHERAL IV DRESSING CHANGE   . PATIENT CLASS/LEVEL OF CARE DESIGNATION - [facility]   . CAROTID ARTERY DUPLEX   . aspirin chewable tablet 243 mg   . AND Linked Order Group    . NS flush syringe    . NS flush syringe   . dextrose 50% (0.5 g/mL) injection - syringe   . SSIP insulin R human (HUMULIN R) 100 units/mL injection   . acetaminophen (TYLENOL) tablet   . ondansetron (ZOFRAN) 2 mg/mL injection   . predniSONE (DELTASONE) tablet 50 mg   . diphenhydrAMINE (BENADRYL) capsule   . losartan (COZAAR) tablet   . ALPRAZolam Duanne Moron) tablet   . ranolazine (RANEXA) extended release tablet   . linagliptin (TRADJENTA) tablet   . metoprolol tartrate (LOPRESSOR) tablet   . amLODIPine (NORVASC) tablet    . fluticasone (FLONASE) 50 mcg per spray nasal spray   . aspirin chewable tablet 81 mg   . clopidogrel (PLAVIX) 75 mg tablet   . pantoprazole (PROTONIX) delayed release tablet   . levothyroxine (SYNTHROID) tablet   . isosorbide mononitrate (IMDUR) 24 hr extended release tablet   . iopamidol (ISOVUE-300) 61% infusion   . calcium carbonate (TUMS) 500mg  (200mg  elemental calcium) chewable tablet   . predniSONE (DELTASONE) tablet 50 mg   . diphenhydrAMINE (BENADRYL) capsule     Active Orders   Imaging    CT ANGIO CAROTID-EXTRACRANIAL (NECK) W IV CONTRAST     Frequency: ONE TIME     Number of Occurrences: 1 Occurrences     Scheduling Instructions:               Diet    DIET DIABETIC Calorie Amount: CC 1800     Frequency: All Meals     Number of Occurrences: 1 Occurrences   Nursing    ACTIVITY Activity: OOB WITH ASSISTANCE     Frequency: UNTIL DISCONTINUED     Number of Occurrences: Until Specified    Notify MD Vital Signs     Frequency: PRN     Number of Occurrences: Until Specified    PT IS MEDIUM RISK FOR VENOUS THROMBOEMBOLISM     Frequency: CONTINUOUS     Number of Occurrences: Until Specified    PULSE OXIMETRY QSHIFT     Frequency: QSHIFT     Number of Occurrences: Until Specified    TELEMETRY - Patient may NOT leave unit w/o monitoring     Frequency: CONTINUOUS     Number of Occurrences: Until Specified    VITAL SIGNS QSHIFT     Frequency: QSHIFT     Number of Occurrences: Until Specified   Code Status    FULL CODE     Frequency: CONTINUOUS     Number of Occurrences: Until Specified   Point of  Care Testing    PERFORM POC WHOLE BLOOD GLUCOSE     Frequency: TID AC & HS     Number of Occurrences: Until Specified   IV    INSERT & MAINTAIN PERIPHERAL IV ACCESS     Linked Order: And     Frequency: UNTIL DISCONTINUED     Number of Occurrences: Until Specified    PERIPHERAL IV DRESSING CHANGE     Linked Order: And     Frequency: PRN     Number of Occurrences: Until Specified   Medications    acetaminophen (TYLENOL) tablet      Frequency: Q4H PRN     Dose: 650 mg     Route: Oral    ALPRAZolam (XANAX) tablet     Frequency: QPM     Dose: 0.5 mg     Route: Oral    amLODIPine (NORVASC) tablet     Frequency: Daily     Dose: 5 mg     Route: Oral    aspirin chewable tablet 81 mg     Frequency: Daily     Dose: 81 mg     Route: Oral    calcium carbonate (TUMS) 500mg  (200mg  elemental calcium) chewable tablet     Frequency: 3x/day PRN     Dose: 500 mg     Route: Oral    clopidogrel (PLAVIX) 75 mg tablet     Frequency: Daily     Dose: 75 mg     Route: Oral    dextrose 50% (0.5 g/mL) injection - syringe     Frequency: Q15 Min PRN     Dose: 25 g     Route: Intravenous    fluticasone (FLONASE) 50 mcg per spray nasal spray     Frequency: Daily     Dose: 1 Spray     Route: Each Nostril    isosorbide mononitrate (IMDUR) 24 hr extended release tablet     Frequency: QAM     Dose: 120 mg     Route: Oral    levothyroxine (SYNTHROID) tablet     Frequency: QAM     Dose: 112 mcg     Route: Oral    linagliptin (TRADJENTA) tablet     Frequency: Daily     Dose: 5 mg     Route: Oral    losartan (COZAAR) tablet     Frequency: Daily     Dose: 100 mg     Route: Oral    metoprolol tartrate (LOPRESSOR) tablet     Frequency: 2x/day     Dose: 50 mg     Route: Oral    NS flush syringe     Linked Order: And     Frequency: 2x/day     Dose: 5 mL     Route: Intracatheter    NS flush syringe     Linked Order: And     Frequency: Q1 MIN PRN     Dose: 5 mL     Route: Intracatheter    ondansetron (ZOFRAN) 2 mg/mL injection     Frequency: Q6H PRN     Dose: 4 mg     Route: Intravenous    pantoprazole (PROTONIX) delayed release tablet     Frequency: Daily     Dose: 40 mg     Route: Oral    ranolazine (RANEXA) extended release tablet     Frequency: 2x/day     Dose: 500 mg     Route: Oral  SSIP insulin R human (HUMULIN R) 100 units/mL injection     Frequency: 4x/day AC     Dose: 0-6 Units     Route: Subcutaneous     -syncope:  Patient asymptomatic since admission.  Carotid ultrasound  showed bilateral stenosis.  Will obtain CTA today.    -elevated D-dimer:  CTA negative for pulmonary embolus    DISPOSITION:  Anticipated discharge later today.  Patient may need vascular follow-up.      Yolanda Bonine, DO

## 2022-03-28 NOTE — Care Plan (Signed)
Problem: Adult Inpatient Plan of Care  Goal: Rounds/Family Conference  Flowsheets (Taken 03/28/2022 [phone])  Participants:   nursing   occupational therapy   pharmacy   physical therapy   physician   social work/services   speech language pathology  Note: CT today, Monitoring telemetry and labs.  Discussed Antibiotic, VTE appropriateness, and evaluated completion of home medications.

## 2022-03-28 NOTE — Nurses Notes (Signed)
Dr. Jackson in to see patient.

## 2022-03-28 NOTE — Discharge Summary (Signed)
[facility]  DISCHARGE SUMMARY    PATIENT NAME:  [name] [name], [name] [name]  MRN:  [id number]  DOB:      ENCOUNTER DATE:  03/26/2022  INPATIENT ADMISSION DATE:   DISCHARGE DATE:  03/28/2022    ATTENDING PHYSICIAN: Claria Dice, DO  SERVICE: [facility] MED/SURG  PRIMARY CARE PHYSICIAN: Marko Stai, PA-C       No lay caregiver identified.      PRIMARY DISCHARGE DIAGNOSIS: Syncope  Active Hospital Problems    Diagnosis Date Noted   . Principal Problem: Syncope [R55] 03/26/2022   . Elevated d-dimer [R79.89] 03/26/2022   . Type 2 diabetes mellitus (CMS HCC) [E11.9] 06/26/2021   . CAD (coronary artery disease) [I25.10] 06/26/2021      Resolved Hospital Problems   No resolved problems to display.     Active Non-Hospital Problems    Diagnosis Date Noted   . Coronary artery disease 01/18/2022   . Unstable angina (CMS HCC) 07/26/2021   . History of percutaneous coronary intervention 06/26/2021   . Pulmonary HTN (CMS HCC) 06/26/2021   . HTN (hypertension) 06/26/2021   . Hyperlipidemia 06/26/2021   . Hypothyroidism 06/26/2021   . Angina, class III (CMS HCC) 01/19/2020           Current Discharge Medication List      CONTINUE these medications - NO CHANGES were made during your visit.      Details   ALPRAZolam 0.5 mg Tablet  Commonly known as: XANAX   0.5 mg, Oral, EVERY EVENING  Refills: 0     amLODIPine 5 mg Tablet  Commonly known as: NORVASC   5 mg, Oral, DAILY  Qty: 60 Tablet  Refills: 2     aspirin 81 mg Tablet, Chewable   81 mg, Oral, DAILY  Qty: 90 Tablet  Refills: 3     B COMPLEX-VITAMIN B12 ORAL   Oral, DAILY  Refills: 0     Cholecalciferol (Vitamin D3) 10 mcg (400 unit) Tablet, Chewable   Oral, DAILY  Refills: 0     clopidogreL 75 mg Tablet  Commonly known as: PLAVIX   75 mg, Oral, DAILY  Refills: 0     ezetimibe 10 mg Tablet  Commonly known as: ZETIA   10 mg, Oral, EVERY EVENING  Qty: 90 Tablet  Refills: 3     FLONASE NASL   Nasal  Refills: 0     Irbesartan-Hydrochlorothiazide 300-12.5 mg Tablet   300 mg, Oral,  DAILY  Refills: 0     Isosorbide Mononitrate 120 mg Tablet Sustained Release 24 hr  Commonly known as: IMDUR   120 mg, Oral, EVERY MORNING  Qty: 90 Tablet  Refills: 3     levothyroxine 112 mcg Tablet  Commonly known as: SYNTHROID   112 mcg, Oral, EVERY MORNING  Refills: 0     metoprolol tartrate 75 mg Tablet  Commonly known as: LOPRESSOR   75 mg, Oral, 2 TIMES DAILY  Qty: 120 Tablet  Refills: 1     nitroGLYCERIN 0.4 mg Tablet, Sublingual  Commonly known as: NITROSTAT   PLACE 1 TABLET UNDER TONGUE EVERY 5 MINS, UP TO 3 DOSES AS NEEDED FOR CHEST PAIN  Qty: 25 Tablet  Refills: 1     pantoprazole 40 mg Tablet, Delayed Release (E.C.)  Commonly known as: PROTONIX   40 mg, Oral, DAILY  Refills: 0     POTASSIUM-99 ORAL   Oral, DAILY  Refills: 0     ranolazine 500 mg Tablet Sustained Release 12 hr  Commonly known as: RANEXA   Oral, 2 TIMES DAILY  Refills: 0     Repatha SureClick 140 mg/mL Pen Injector  Generic drug: evolocumab   Inject 1 pen (140 mg) subcutaneously (under the skin) once every 14 days  Qty: 2 mL  Refills: 12     Tradjenta 5 mg Tablet  Generic drug: linaGLIPtin   5 mg, Oral, DAILY  Refills: 0     valACYclovir 500 mg Tablet  Commonly known as: VALTREX   500 mg, Oral, 2 TIMES DAILY  Refills: 0        STOP taking these medications.    amoxicillin-pot clavulanate 875-125 mg Tablet  Commonly known as: AUGMENTIN          Discharge med list refreshed?  YES     Allergies   Allergen Reactions   . Pneumococcal 23-Valent Polysaccharide Vaccine  Other Adverse Reaction (Add comment)     Severe local swelling   . Tramadol Nausea/ Vomiting     "makes me sick with or without food"   . Iohexol  Other Adverse Reaction (Add comment) and Hives/ Urticaria      Desc: HIVES SEVERAL HRS S/P IV CONTRAST.. [place] LAST SCAN W/ BENADRYL PREMED @ [facility] '01/AC.   Marland Kitchen Lisinopril  Other Adverse Reaction (Add comment)     Other reaction(s): Cough   . Other Rash     Other reaction(s): Other (See Comments)  Nickle   When teeth were wired in she place,  patient obtained an infection and wiring had to be removed,  Also allergic to silver   . Crestor [Rosuvastatin]      Muscle aches   . Lovastatin    . Statin [Atorvastatin]      Muscle ache   . Iv Contrast Hives/ Urticaria   . Nickel Hives/ Urticaria     HOSPITAL PROCEDURE(S):   No orders of the defined types were placed in this encounter.      REASON FOR HOSPITALIZATION AND HOSPITAL COURSE   BRIEF HPI:    This is an 81 y.o. female who presented to the ER with complaint of syncope.  Patient reported that day prior to presentation, she was at church and had a syncopal episode.  She reported that prior to event, she did feel flushed.  Per report, was unable to obtain BP during episode.  Patient was out around 10 minutes.  No loss of bowel/bladder or tonic/clonic activity.  Patient did report chest pain prior and after this event, chest pain resolved morning of presentation but patient did complain of generalized fatigue.  She reported some mid-upper abdomen discomfort earlier in the day, resolved at time of exam.  Of note, patient has extensive cardiac history, with 3 stents in the past, last stent placed at [facility] in 01/2022.  She reported she has had URI symptoms for the past few weeks, completed 2 rounds of abx and course of steroids.  Her work-up in ER revealed WBC 14.2, sodium 129 (134), LFTs normal range, lipase 109, D-dimer 886, procalcitonin < 0.02, UA negative, troponin 10 and < 7 on recheck, COVID/flu/RSV not detected, PCXR no infiltrate.  CT abd/pelvis revealed diverticulosis without inflammatory changes, mild asymmetric kidneys right > left, no peripancreatic inflammatory changes or fluid collections, no AAA or obstruction.  Patient received ASA 324 mg PO in ER.  Due to elevated D-dimer, concern for pulmonary embolism, but patient has contrast allergy.  Patient admitted to hospitalist service for further management and evaluation.    BRIEF  HOSPITAL NARRATIVE:       Patient was placed on telemetry and troponins  were negative.  Telemetry unremarkable.  CTA negative for PE.  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  Patient remained asymptomatic during stay.  She requested discharge on 04/27.  Hemodynamically stable at time of discharge.    TRANSITION/POST DISCHARGE CARE/PENDING TESTS/REFERRALS:  Follow-up with PCP and vascular    CONDITION ON DISCHARGE:  A. Ambulation: Full ambulation  B. Self-care Ability: Complete  C. Cognitive Status Alert and Oriented x 3  D. Code status at discharge:       LINES/DRAINS/WOUNDS AT DISCHARGE:   Patient Lines/Drains/Airways Status     Active Line / Dialysis Catheter / Dialysis Graft / Drain / Airway / Wound     Name Placement date Placement time Site Days    Peripheral IV Proximal;Right Basilic  (medial side of arm) 03/26/22  1055  -- 2                DISCHARGE DISPOSITION:  Home discharge  DISCHARGE INSTRUCTIONS:  Post-Discharge Follow Up Appointments     Monday May 28, 2022    Return Patient Visit with Thornell SartoriusAli, Abbas, MD at 10:15 AM    Wednesday Nov 21, 2022    Return Patient Visit with Dorita FrayMorise, Claris GladdenAnthony P, MD at 11:45 AM    Return Patient Visit with Provider, [facility] [place] at 11:45 AM    Cardiology [facility], [facility]  [facility], [place]  [address]  [place] [id number]  [phone] Cardiology, [facility]  [facility], [facility]  [address]  [place] [id number]  [phone]           DISCHARGE INSTRUCTION - DIABETIC DIET     Diet: DIABETIC DIET      DISCHARGE INSTRUCTION - ACTIVITY     Activity: AS TOLERATED           Claria DiceMatthan Brode Sculley, DO    Copies sent to Care Team       Relationship Specialty Notifications Start End    Marko StaiHile, Amanda, [place] PCP - General PHYSICIAN ASSISTANT  03/10/21     Phone: [phone] Fax: [phone]         [address] [place]  [phone]          Referring providers can utilize [email or web address] to access their referred [facility] patient's information.

## 2022-03-28 NOTE — Discharge Instructions (Signed)
Follow up with Marko Stai [phone]) May 3rd @ 9:00 a.m.   Follow up with Sherilyn Dacosta [phone]) We will call you with appointment date and time.

## 2022-04-19 ENCOUNTER — Other Ambulatory Visit: Payer: Self-pay

## 2022-04-20 ENCOUNTER — Other Ambulatory Visit: Payer: Self-pay

## 2022-04-23 ENCOUNTER — Encounter (INDEPENDENT_AMBULATORY_CARE_PROVIDER_SITE_OTHER): Payer: Self-pay | Admitting: Vascular & Interventional Radiology

## 2022-04-23 ENCOUNTER — Other Ambulatory Visit: Payer: Self-pay

## 2022-04-23 ENCOUNTER — Other Ambulatory Visit: Payer: Self-pay | Admitting: Pharmacist

## 2022-04-23 ENCOUNTER — Ambulatory Visit (INDEPENDENT_AMBULATORY_CARE_PROVIDER_SITE_OTHER): Payer: Self-pay | Admitting: Primary Care

## 2022-04-23 VITALS — BP 120/62 | HR 62 | Temp 97.9°F | Ht 62.0 in | Wt 161.0 lb

## 2022-04-23 DIAGNOSIS — I7121 Aneurysm of the ascending aorta, without rupture (CMS HCC): Secondary | ICD-10-CM

## 2022-04-23 DIAGNOSIS — I6523 Occlusion and stenosis of bilateral carotid arteries: Secondary | ICD-10-CM

## 2022-04-23 DIAGNOSIS — I272 Pulmonary hypertension, unspecified: Secondary | ICD-10-CM

## 2022-04-23 DIAGNOSIS — E785 Hyperlipidemia, unspecified: Secondary | ICD-10-CM

## 2022-04-23 DIAGNOSIS — I10 Essential (primary) hypertension: Secondary | ICD-10-CM

## 2022-04-23 DIAGNOSIS — E119 Type 2 diabetes mellitus without complications: Secondary | ICD-10-CM

## 2022-04-23 NOTE — Telephone Encounter (Signed)
Specialty Pharmacy Follow-up Assessment Note    Date of assessment: 04/23/2022  Patient: [name] [name] is a 81 y.o. female  Diagnosis: hyperlipidemia w/ known ASCVD  Specialty Medication(s): Repatha 140 mg pen SC every 14 days  Prescriber: Dr. Dorita Fray  Initiation of therapy: Sept 2022    Subjective:  Spoke with patient today to follow up after 1st fill of Repatha with our pharmacy. Continues to take every other Tuesday; reports zero missed doses and using paper calendar as a method of adherence. Regarding adherence patient took last dose 5/16 and has zero pens left on hand. Reports the following side effects: none.    She reports overall quality of life is no different since last assessment.      She declined to update medication list today.    Objective:  Pertinent labs include:   01/19/22 Current LDL (calc) 94  05/24/21 Baseline LDL (direct) 180    Assessment:  -reviewed medications, conditions, and allergies with no known contraindications for continuation in therapy  -no significant drug-drug interactions expected  -reported medication on hand does not match fill records and demonstrates non-adherence (expected patient to need refill ~1 week earlier)  -patient is working towards therapeutic goal of LDL < 55    Plan:   -continue therapy: therapy appropriate   -patient declined review of medication today  -scheduled delivery of Repatha for Fri 5/26 with Band-Aids  -scheduled pharmacist follow-up for 1 year    Patient expressed no additional questions and was encouraged to contact [facility] with any questions or concerns.     [address] [place], PHARMD  04/23/2022, 16:01

## 2022-04-23 NOTE — Progress Notes (Unsigned)
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 Tortuous bilateral distal cervical ICA.    By report    By my evaluation less than 70%    Asymptomatic    Medical management '     repeat evaluation in a year

## 2022-04-23 NOTE — Progress Notes (Incomplete)
[facility]  [address]  [place] [address]  Phone: [phone]  Fax: [phone]    VASCULAR SURGERY  NOTE      Patient Name:  [name] [name], [name] [name]  Date of visit:  04/23/2022  Date of Birth:      MRN:  [id number]    PCP: Marko Stai, PA-C    REASON FOR VISIT:  ***    HISTORY OF PRESENT ILLNESS:  ***    Past Medical History:   Diagnosis Date   . Coronary artery disease    . Dyspnea on exertion    . Essential hypertension    . Hyperlipidemia    . PONV (postoperative nausea and vomiting)    . Stented coronary artery    . Type 2 diabetes mellitus (CMS HCC)          Past Surgical History:   Procedure Laterality Date   . CARDIAC CATHETERIZATION     . CATARACT EXTRACTION, BILATERAL Bilateral    . HX COLONOSCOPY     . HX HEART CATHETERIZATION  2017 and 2018    Stented twice  prox circ  and circ   . HX PARTIAL HYSTERECTOMY      tubes and ovaries left intact   . HX THYROIDECTOMY  1978   . HX WISDOM TEETH EXTRACTION     . KNEE SURGERY  2018    muscle removed   . LAPAROSCOPIC CHOLECYSTECTOMY     . RECONSTRUCTION OF NOSE  1980         Family Medical History:     Problem Relation (Age of Onset)    Cardiomyopathy Father    Heart Attack Mother    Heart Disease Sister    Uterine Cancer Mother, Niece          Social History     Socioeconomic History   . Marital status: Married   Tobacco Use   . Smoking status: Former     Packs/day: 0.50     Years: 10.00     Pack years: 5.00     Types: Cigarettes   . Smokeless tobacco: Never   Vaping Use   . Vaping Use: Never used   Substance and Sexual Activity   . Alcohol use: Not Currently     Alcohol/week: 1.0 standard drink     Types: 1 Cans of beer per week     Comment: drank long time ago not current.   . Drug use: Never   Other Topics Concern   . Ability to Walk 1 Flight of Steps without SOB/CP No   . Ability To Do Own ADL's Yes     Review of  Systems:     Constitutional Negative for: Fever, Chills, Weight Loss, Malaise/Fatigue, Diaphoresis,  Weakness     Skin Negative for: Rash, Itching     HENT Negative for: Headaches, Hearing Loss, Tinnitus, Ear Pain, Ear Discharge, Nosebleeds, Congestion, Stridor, Sore Throat     Eyes Negative for: Blurred Vision, Double Vision, Photophobia, Eye Pain, Eye Discharge, Eye Redness     Cardiovascular Negative for: PND, Leg Swelling, Orthopnea, Claudication, Palpitations, Chest Pain     Respiratory Negative for: Cough, Hemoptysis, Sputum Production, Shortness of Breath, Wheezing     Gastrointestinal Negative for: Heartburn, Nausea, Vomiting, Abdominal Pain, Diarrhea, Constipation, Blood in Stool, Melena     Genitourinary Negative for: Dysuria, Urgency, Frequency, Hematuria, Flank Pain     Musculoskeletal Negative for: Myalgias, Neck Pain, Back Pain, Joint  Pain, Falls     Endo/Heme/Allergy Negative for: Easy Bruise/Bleed, Env Allergies, Polydipsia     Neurological Negative for : Dizziness, Tingling, Tremor, Sensory Change, Speech Change, Focal Weakness, Seizures, LOC     Psychological Negative for: Memory Loss, Insomnia, Nervoucs/Anxious, Hallucinations, Substance Abuse, Suicidal Ideas, Depression                          ***    OBJECTIVE:    Vital Signs:  BP 120/62   Pulse 62   Temp 36.6 C (97.9 F)   Ht 1.575 m (5\' 2" )   Wt 73 kg (161 lb)   SpO2 95%   BMI 29.45 kg/m         Physical exam :      General Exam:    General:  {GENERAL :20860}    Neck:  {Neck:20861}  *** carotid bruit    Lungs:  {Lung:20862}    Cardiovascular:  {Cardiovascular:20864}    Abdomen:  {Gastrointestinal:20865}    Extremities:  {Extremities:20866}    Vascular  {Vascular:21218}    Neurologic: {Neurologic:20868}    Open and surgical wounds: ***    Diagnostic Tests:  Reviewed:  ***    ASSESSMENT:  ***  ENCOUNTER DIAGNOSES     ICD-10-CM   1. Thoracic ascending aortic aneurysm (CMS HCC)  I71.21   2. Type 2 diabetes mellitus (CMS HCC)  E11.9   3. Pulmonary HTN (CMS HCC)  I27.20   4. Hypertension, unspecified type  I10   5. Hyperlipidemia,  unspecified hyperlipidemia type  E78.5         PLAN:   ***    Problem List:  Patient Active Problem List    Diagnosis Date Noted   . Syncope 03/26/2022   . Elevated d-dimer 03/26/2022   . Coronary artery disease 01/18/2022   . Unstable angina (CMS HCC) 07/26/2021   . CAD (coronary artery disease) 06/26/2021   . History of percutaneous coronary intervention 06/26/2021   . Type 2 diabetes mellitus (CMS HCC) 06/26/2021   . Pulmonary HTN (CMS HCC) 06/26/2021   . HTN (hypertension) 06/26/2021   . Hyperlipidemia 06/26/2021   . Hypothyroidism 06/26/2021   . 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

## 2022-04-24 ENCOUNTER — Other Ambulatory Visit: Payer: Self-pay

## 2022-04-26 ENCOUNTER — Other Ambulatory Visit: Payer: Self-pay

## 2022-05-01 ENCOUNTER — Other Ambulatory Visit (INDEPENDENT_AMBULATORY_CARE_PROVIDER_SITE_OTHER): Payer: Self-pay | Admitting: INTERNAL MEDICINE CARDIOVASCULAR DISEASE

## 2022-05-01 MED ORDER — METOPROLOL TARTRATE 50 MG TABLET
50.0000 mg | ORAL_TABLET | Freq: Two times a day (BID) | ORAL | 3 refills | Status: DC
Start: 2022-05-01 — End: 2023-03-20

## 2022-05-03 NOTE — H&P (Unsigned)
[facility]  [address]  [place] [address]  Phone: [phone]  Fax: [phone]  H&P      Encounter Date: 04/23/2022    Patient ID:  [name] [name]  YIF:[id number]    DOB:   Age: 81 y.o. female    Subjective:     Chief Complaint   Patient presents with   . New Patient     ROOM 6- 60-70% CAS-CT scan done 03/28/22 at [facility]       HPI  [name] [name] is a 81 y.o. female who was seen today for evaluation of  Bilateral carotid artery stenosis. The patient has no history of CVA. The patient  no history of previous intervention for carotid artery diease. The patient recently had imaging that demonstrates 70% left ICA and 60% right ICA stenosis. Per Dr. Doneen Poisson reading less than 70% bilateral carotid artery stenosis.    Presents today with no complaints. No signs or symptoms of stroke or TIA, no dizziness, syncope, weakness, or vision changes. The patient no previous history of radiation to the neck.  The patient is on medical management with aspirin, plavix.   Current Outpatient Medications   Medication Sig   . ALPRAZolam (XANAX) 0.5 mg Oral Tablet Take 1 Tablet (0.5 mg total) by mouth Every evening   . amLODIPine (NORVASC) 5 mg Oral Tablet Take 1 Tablet (5 mg total) by mouth Once a day   . aspirin 81 mg Oral Tablet, Chewable Chew 1 Tablet (81 mg total) Once a day   . Cholecalciferol, Vitamin D3, 10 mcg (400 unit) Oral Tablet, Chewable Chew Once a day   . clopidogreL (PLAVIX) 75 mg Oral Tablet Take 1 Tablet (75 mg total) by mouth Once a day   . evolocumab (REPATHA SURECLICK) 140 mg/mL Subcutaneous Pen Injector Inject 1 pen (140 mg) subcutaneously (under the skin) once every 14 days   . ezetimibe (ZETIA) 10 mg Oral Tablet Take 1 Tablet (10 mg total) by mouth Every evening   . fluticasone propionate (FLONASE NASL) by Nasal route   . Irbesartan-Hydrochlorothiazide 300-12.5 mg Oral Tablet Take 300 mg by mouth Once a day   . Isosorbide Mononitrate (IMDUR) 120 mg Oral Tablet  Sustained Release 24 hr TAKE 1 TABLET (120 MG TOTAL) BY MOUTH EVERY MORNING   . levothyroxine (SYNTHROID) 112 mcg Oral Tablet Take 1 Tablet (112 mcg total) by mouth Every morning   . metoprolol tartrate (LOPRESSOR) 50 mg Oral Tablet Take 1 Tablet (50 mg total) by mouth Twice daily 50mg  in the AM and 50mg  in PM   . nitroGLYCERIN (NITROSTAT) 0.4 mg Sublingual Tablet, Sublingual PLACE 1 TABLET UNDER TONGUE EVERY 5 MINS, UP TO 3 DOSES AS NEEDED FOR CHEST PAIN   . pantoprazole (PROTONIX) 40 mg Oral Tablet, Delayed Release (E.C.) Take 1 Tablet (40 mg total) by mouth Once a day   . POTASSIUM-99 ORAL Take by mouth Once a day   . ranolazine (RANEXA) 500 mg Oral Tablet Sustained Release 12 hr Take by mouth Twice daily   . TRADJENTA 5 mg Oral Tablet Take 1 Tablet (5 mg total) by mouth Once a day   . valACYclovir (VALTREX) 500 mg Oral Tablet Take 1 Tablet (500 mg total) by mouth Twice daily   . vitamin B complex (B COMPLEX-VITAMIN B12 ORAL) Take by mouth Once a day     Allergies   Allergen Reactions   . Pneumococcal 23-Valent Polysaccharide Vaccine  Other Adverse Reaction (Add comment)  Severe local swelling   . Tramadol Nausea/ Vomiting     "makes me sick with or without food"   . Iohexol  Other Adverse Reaction (Add comment) and Hives/ Urticaria      Desc: HIVES SEVERAL HRS S/P IV CONTRAST.. [place] LAST SCAN W/ BENADRYL PREMED @ [facility] '01/AC.   Marland Kitchen Lisinopril  Other Adverse Reaction (Add comment)     Other reaction(s): Cough   . Other Rash     Other reaction(s): Other (See Comments)  Nickle   When teeth were wired in she place, patient obtained an infection and wiring had to be removed,  Also allergic to silver   . Crestor [Rosuvastatin]      Muscle aches   . Lovastatin    . Statin [Atorvastatin]      Muscle ache   . Iv Contrast Hives/ Urticaria   . Nickel Hives/ Urticaria       Past Medical History:   Diagnosis Date   . Coronary artery disease    . Dyspnea on exertion    . Essential hypertension    . Hyperlipidemia    . PONV  (postoperative nausea and vomiting)    . Stented coronary artery    . Type 2 diabetes mellitus (CMS HCC)          Past Surgical History:   Procedure Laterality Date   . CARDIAC CATHETERIZATION     . CATARACT EXTRACTION, BILATERAL Bilateral    . HX COLONOSCOPY     . HX HEART CATHETERIZATION  2017 and 2018    Stented twice  prox circ  and circ   . HX PARTIAL HYSTERECTOMY      tubes and ovaries left intact   . HX THYROIDECTOMY  1978   . HX WISDOM TEETH EXTRACTION     . KNEE SURGERY  2018    muscle removed   . LAPAROSCOPIC CHOLECYSTECTOMY     . RECONSTRUCTION OF NOSE  1980         Family Medical History:     Problem Relation (Age of Onset)    Cardiomyopathy Father    Heart Attack Mother    Heart Disease Sister    Uterine Cancer Mother, Niece          Social History     Tobacco Use   . Smoking status: Former     Packs/day: 0.50     Years: 10.00     Pack years: 5.00     Types: Cigarettes   . Smokeless tobacco: Never   Vaping Use   . Vaping Use: Never used   Substance Use Topics   . Alcohol use: Not Currently     Alcohol/week: 1.0 standard drink     Types: 1 Cans of beer per week     Comment: drank long time ago not current.   . Drug use: Never       Review of  Systems:     Constitutional Negative for: Fever, Chills, Weight Loss, Malaise/Fatigue, Diaphoresis, Weakness     Skin Negative for: Rash, Itching     HENT Negative for: Headaches, Hearing Loss, Tinnitus, Ear Pain, Ear Discharge, Nosebleeds, Congestion, Stridor, Sore Throat     Eyes Negative for: Blurred Vision, Double Vision, Photophobia, Eye Pain, Eye Discharge, Eye Redness     Cardiovascular Negative for: PND, Leg Swelling, Orthopnea, Claudication, Palpitations, Chest Pain     Respiratory Negative for: Cough, Hemoptysis, Sputum Production, Shortness of Breath, Wheezing     Gastrointestinal Negative  for: Heartburn, Nausea, Vomiting, Abdominal Pain, Diarrhea, Constipation, Blood in Stool, Melena     Genitourinary Negative for: Dysuria, Urgency, Frequency, Hematuria,  Flank Pain     Musculoskeletal Negative for: Myalgias, Neck Pain, Back Pain, Joint Pain, Falls     Endo/Heme/Allergy Negative for: Easy Bruise/Bleed, Env Allergies, Polydipsia     Neurological Negative for : Dizziness, Tingling, Tremor, Sensory Change, Speech Change, Focal Weakness, Seizures, LOC     Psychological Negative for: Memory Loss, Insomnia, Nervoucs/Anxious, Hallucinations, Substance Abuse, Suicidal Ideas, Depression                          Objective:   Vitals: BP 120/62   Pulse 62   Temp 36.6 C (97.9 F)   Ht 1.575 m (5\' 2" )   Wt 73 kg (161 lb)   SpO2 95%   BMI 29.45 kg/m         General Exam:    General:  appears in good health and no distress  Lungs:  Clear to auscultation bilaterally.   Cardiovascular:  regular rate and rhythm  Extremities:  No cyanosis or edema  Neurologic: Grossly normal, Alert and oriented x3  Vascular    pulses 1+ throughout          Ancillary Tests Reviewed:       Study Result    Narrative & Impression   [name] [name]    PROCEDURE DESCRIPTION: CT ANGIO CAROTID-EXTRACRANIAL (NECK) W IV CONTRAST    3D/MIP reconstructions were generated.    CLINICAL INDICATION: Carotid stenosis    COMPARISON: Carotid ultrasound from March 27, 2022.      FINDINGS: Mild atherosclerosis seen in the aortic arch. There is mild calcification along the course of the left common carotid artery. Calcifications at the left carotid bulb causing approximately 70 % stenosis at the proximal ICA. There is a tortuous left distal cervical ICA. Calcified plaque seen at the right carotid bulb and proximal ICA causing approximately 60% stenosis in the proximal ICA. There is a tortuous right distal cervical ICA. Both vertebral arteries are patent. Minimal calcifications noted in the left vertebral artery foraminal segment. There are cervical spine degenerative changes with moderate degenerative disc space narrowing and spondylosis. Opacified left maxillary sinus noted. Mild calcification seen in the  bilateral intracranial internal carotid arteries.    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 Tortuous bilateral distal cervical ICA.             ENCOUNTER DIAGNOSES     ICD-10-CM   1. Thoracic ascending aortic aneurysm (CMS HCC)  I71.21   2. Type 2 diabetes mellitus (CMS HCC)  E11.9   3. Pulmonary HTN (CMS HCC)  I27.20   4. Hypertension, unspecified type  I10   5. Hyperlipidemia, unspecified hyperlipidemia type  E78.5   6. Bilateral carotid artery stenosis  I65.23       Assessment     Bilateral Carotid artery disease, asymptomatic   Hypertension   Hyperlipidemia           Plan       Will schedule the patient for CT angiogram intracranial/extracranial in 6 months   Continue medical management aspirin, Plavix   Will follow up in 6 months   Aggressive risk factor modification         No orders of the defined types were placed in this encounter.      Return in about  6 months (around 10/24/2022).    [place] Braelin Brosch, APRN,NP-C        I am scribing for, and in the presence of, Dr. Doneen PoissonAdeniyi  for services provided on 04/23/2022.  [place] Cypher Paule, APRN,NP-C

## 2022-05-22 ENCOUNTER — Other Ambulatory Visit (INDEPENDENT_AMBULATORY_CARE_PROVIDER_SITE_OTHER): Payer: Self-pay

## 2022-05-28 ENCOUNTER — Other Ambulatory Visit: Payer: Self-pay

## 2022-05-28 ENCOUNTER — Inpatient Hospital Stay
Admission: RE | Admit: 2022-05-28 | Discharge: 2022-05-28 | Disposition: A | Discharge status: Home / Self Care | Payer: Medicare PPO | Source: Ambulatory Visit | Attending: INTERNAL MEDICINE CARDIOVASCULAR DISEASE | Admitting: INTERNAL MEDICINE CARDIOVASCULAR DISEASE

## 2022-05-28 ENCOUNTER — Ambulatory Visit (INDEPENDENT_AMBULATORY_CARE_PROVIDER_SITE_OTHER): Payer: Medicare PPO | Admitting: INTERNAL MEDICINE CARDIOVASCULAR DISEASE

## 2022-05-28 VITALS — BP 120/60 | HR 61 | Resp 18 | Ht 62.0 in | Wt 162.0 lb

## 2022-05-28 DIAGNOSIS — Z9861 Coronary angioplasty status: Secondary | ICD-10-CM

## 2022-05-28 DIAGNOSIS — R55 Syncope and collapse: Secondary | ICD-10-CM

## 2022-05-28 DIAGNOSIS — E119 Type 2 diabetes mellitus without complications: Secondary | ICD-10-CM

## 2022-05-28 DIAGNOSIS — I7121 Aneurysm of the ascending aorta, without rupture (CMS HCC): Secondary | ICD-10-CM

## 2022-05-28 DIAGNOSIS — E785 Hyperlipidemia, unspecified: Secondary | ICD-10-CM

## 2022-05-28 DIAGNOSIS — I6529 Occlusion and stenosis of unspecified carotid artery: Secondary | ICD-10-CM

## 2022-05-28 DIAGNOSIS — I2510 Atherosclerotic heart disease of native coronary artery without angina pectoris: Secondary | ICD-10-CM

## 2022-05-28 DIAGNOSIS — I10 Essential (primary) hypertension: Secondary | ICD-10-CM

## 2022-05-28 MED ORDER — EMPAGLIFLOZIN 10 MG TABLET
10.0000 mg | ORAL_TABLET | Freq: Every day | ORAL | 4 refills | Status: DC
Start: 2022-05-28 — End: 2024-09-23

## 2022-05-28 MED ORDER — NITROGLYCERIN 0.4 MG SUBLINGUAL TABLET
SUBLINGUAL_TABLET | SUBLINGUAL | 3 refills | Status: AC
Start: 2022-05-28 — End: ?

## 2022-05-28 NOTE — Nursing Note (Signed)
Pt did not have a med list with them at visit - went over list with patient   Advised patient to check AVS list with medicines at home and call if any discrepancies    -Confirmed correct pharmacy with patient for e-scribing Alisa Graff, Ambulatory Care Assistant 05/28/2022 10:10

## 2022-05-28 NOTE — Progress Notes (Signed)
CARDIOLOGY [facility] [place], [facility] MEDICAL BUILDING  [address] [place] [address]  [facility]  Progress Note    Name: Jenyfer Trawick MRN:  [id number]   Date: 05/28/2022 Age: 81 y.o.     Chief Complaint: Follow Up 3 Months, Shortness of Breath, and Leg Swelling    Progress Note  81 y.o. female presenting for follow up. She has a PMH significant for CAD, HTN, HLD and DM. She has known CAD status post PCI to the left circumflex x 2 with most recent stent placement in 01/2022. She had syncope 03/2022    The history is provided by the patient.     Syncope  -ve Palpitations (4)  +ve Heart disease or abnormal ECG or both (3)  -ve Syncope during effort (3)  -ve Syncope while supine (2)  -ve Precipitating or predisposing factors or both (-1)  -ve Autonomic prodromes (-1)    Total score 3      Patient Active Problem List    Diagnosis   . Thoracic ascending aortic aneurysm (CMS HCC)   . Syncope   . Elevated d-dimer   . Coronary artery disease   . Unstable angina (CMS HCC)   . CAD (coronary artery disease)   . History of percutaneous coronary intervention   . Type 2 diabetes mellitus (CMS HCC)   . Pulmonary HTN (CMS HCC)   . HTN (hypertension)   . Hyperlipidemia   . Hypothyroidism   . Angina, class III (CMS HCC)     Past Medical History:   Diagnosis Date   . Coronary artery disease    . Dyspnea on exertion    . Essential hypertension    . Hyperlipidemia    . PONV (postoperative nausea and vomiting)    . Stented coronary artery    . Type 2 diabetes mellitus (CMS HCC)          Past Surgical History:   Procedure Laterality Date   . CARDIAC CATHETERIZATION     . CATARACT EXTRACTION, BILATERAL Bilateral    . HX COLONOSCOPY     . HX HEART CATHETERIZATION  2017 and 2018    Stented twice  prox circ  and circ   . HX PARTIAL HYSTERECTOMY      tubes and ovaries left intact   . HX THYROIDECTOMY  1978   . HX WISDOM TEETH EXTRACTION     . KNEE SURGERY  2018    muscle removed   . LAPAROSCOPIC  CHOLECYSTECTOMY     . RECONSTRUCTION OF NOSE  1980         Current Outpatient Medications   Medication Sig   . ALPRAZolam (XANAX) 0.5 mg Oral Tablet Take 1 Tablet (0.5 mg total) by mouth Every evening   . amLODIPine (NORVASC) 5 mg Oral Tablet Take 1 Tablet (5 mg total) by mouth Once a day   . aspirin 81 mg Oral Tablet, Chewable Chew 1 Tablet (81 mg total) Once a day   . Cholecalciferol, Vitamin D3, 10 mcg (400 unit) Oral Tablet, Chewable Chew Once a day   . clopidogreL (PLAVIX) 75 mg Oral Tablet Take 1 Tablet (75 mg total) by mouth Once a day   . empagliflozin (JARDIANCE) 10 mg Oral Tablet Take 1 Tablet (10 mg total) by mouth Once a day   . evolocumab (REPATHA SURECLICK) 140 mg/mL Subcutaneous Pen Injector Inject 1 pen (140 mg) subcutaneously (under the skin) once every 14 days   . ezetimibe (  ZETIA) 10 mg Oral Tablet Take 1 Tablet (10 mg total) by mouth Every evening   . fluticasone propionate (FLONASE NASL) by Nasal route   . Irbesartan-Hydrochlorothiazide 300-12.5 mg Oral Tablet Take 300 mg by mouth Once a day   . Isosorbide Mononitrate (IMDUR) 120 mg Oral Tablet Sustained Release 24 hr TAKE 1 TABLET (120 MG TOTAL) BY MOUTH EVERY MORNING   . levothyroxine (SYNTHROID) 112 mcg Oral Tablet Take 1 Tablet (112 mcg total) by mouth Every morning   . metoprolol tartrate (LOPRESSOR) 50 mg Oral Tablet Take 1 Tablet (50 mg total) by mouth Twice daily 50mg  in the AM and 50mg  in PM   . nitroGLYCERIN (NITROSTAT) 0.4 mg Sublingual Tablet, Sublingual PLACE 1 TABLET UNDER TONGUE EVERY 5 MINS, UP TO 3 DOSES AS NEEDED FOR CHEST PAIN   . pantoprazole (PROTONIX) 40 mg Oral Tablet, Delayed Release (E.C.) Take 1 Tablet (40 mg total) by mouth Once a day   . POTASSIUM-99 ORAL Take by mouth Once a day   . ranolazine (RANEXA) 500 mg Oral Tablet Sustained Release 12 hr Take by mouth Twice daily   . TRADJENTA 5 mg Oral Tablet Take 1 Tablet (5 mg total) by mouth Once a day   . valACYclovir (VALTREX) 500 mg Oral Tablet Take 1 Tablet (500 mg  total) by mouth Twice daily   . vitamin B complex (B COMPLEX-VITAMIN B12 ORAL) Take by mouth Once a day     Allergies   Allergen Reactions   . Pneumococcal 23-Valent Polysaccharide Vaccine  Other Adverse Reaction (Add comment)     Severe local swelling   . Tramadol Nausea/ Vomiting     "makes me sick with or without food"   . Iohexol  Other Adverse Reaction (Add comment) and Hives/ Urticaria      Desc: HIVES SEVERAL HRS S/P IV CONTRAST.. [place] LAST SCAN W/ BENADRYL PREMED @ [facility] '01/AC.   Lisinopril  Other Adverse Reaction (Add comment)     Other reaction(s): Cough   . Other Rash     Other reaction(s): Other (See Comments)  Nickle   When teeth were wired in she place, patient obtained an infection and wiring had to be removed,  Also allergic to silver   . Crestor [Rosuvastatin]      Muscle aches   . Lovastatin    . Statin [Atorvastatin]      Muscle ache   . Iv Contrast Hives/ Urticaria   . Nickel Hives/ Urticaria     Family Medical History:     Problem Relation (Age of Onset)    Cardiomyopathy Father    Heart Attack Mother    Heart Disease Sister    Uterine Cancer Mother, Niece          Social History     Tobacco Use   . Smoking status: Former     Packs/day: 0.50     Years: 10.00     Pack years: 5.00     Types: Cigarettes   . Smokeless tobacco: Never   Substance Use Topics   . Alcohol use: Not Currently     Alcohol/week: 1.0 standard drink     Types: 1 Cans of beer per week     Comment: drank long time ago not current.      Review of Systems  Review of Systems   All other systems reviewed and are negative.    Examination:  BP 120/60   Pulse 61   Resp 18   Ht  1.575 m (5\' 2" )   Wt 73.5 kg (162 lb)   SpO2 96%   BMI 29.63 kg/m       Physical Exam  Vitals and nursing note reviewed.   Constitutional:       Appearance: She is well-developed.   HENT:      Head: Normocephalic.   Eyes:      Pupils: Pupils are equal, round, and reactive to light.   Neck:      Vascular: No JVD.   Cardiovascular:      Rate and Rhythm: Normal  rate and regular rhythm.      Heart sounds: Normal heart sounds. No murmur heard.  Pulmonary:      Effort: Pulmonary effort is normal.      Breath sounds: Normal breath sounds.   Abdominal:      General: Bowel sounds are normal.      Palpations: Abdomen is soft.   Musculoskeletal:         General: No deformity. Normal range of motion.      Cervical back: Normal range of motion.   Skin:     General: Skin is warm and dry.   Neurological:      Mental Status: She is alert and oriented to person, place, and time.      Cranial Nerves: No cranial nerve deficit.           Discharge Summary [facility] 03/2022  This is an80 y.o.femalewho presented to the ER with complaint of syncope. Patient reported that day prior to presentation, she was at church and had a syncopal episode. She reported that prior to event, she did feel flushed. Per report, was unable to obtain BP during episode. Patient was out around 10 minutes. No loss of bowel/bladder or tonic/clonic activity. Patient did report chest pain prior and after this event, chest pain resolved morning of presentation but patient did complain of generalized fatigue. She reported some mid-upper abdomen discomfort earlier in the day, resolved at time of exam. Of note, patient has extensive cardiac history, with 3 stents in the past, last [facility] in 01/[facility]. She reported she has had URI symptoms for the past few weeks, completed 2 rounds of abx and course of steroids. Her work-up in ER revealed WBC 14.2, sodium 129 (134), LFTs normal range, lipase 109, D-dimer 886, procalcitonin <0.02, UA negative, troponin 10 and <7 on recheck, COVID/flu/RSV not detected, PCXR no infiltrate. CT abd/pelvis revealed diverticulosis without inflammatory changes, mild asymmetric kidneys right >left, no peripancreatic inflammatory changes or fluid collections, no AAA or obstruction. Patient received ASA 324 mg PO in ER. Due to elevated D-dimer, concern for pulmonary embolism, but  patient has contrast allergy. Patient admitted to hospitalist service for further management and evaluation.    Labs:  Lab Results   Component Value Date    CHOLESTEROL 171 01/19/2022    HDLCHOL 51 01/19/2022    LDLCHOL 94 01/19/2022    LDLCHOLDIR 130 11/15/2021    TRIG 147 01/19/2022     CBC Results   No results for input(s): WBC, HGB, HCT, PLTCNT, BANDS in the last 72 hours.   BMP Results   No results for input(s): SODIUM, POTASSIUM, CHLORIDE, CO2, BUN, CREATININE, GFR, ANIONGAP in the last 72 hours.  No results for input(s): CALCIUM, ALBUMIN, MAGNESIUM in the last 72 hours.   Coag Results     No results for input(s): INR, PROTHROMTME, APTT in the last 72 hours.    Invalid input(s): PTT,  PT, CREACTPROTIE   Cardiac Results        No results for input(s): UHCEASTTROPI, CKMB, MBINDEX, BNP in the last 72 hours.     Lab Results   Component Value Date    SODIUM 135 (L) 03/28/2022    POTASSIUM 4.7 03/28/2022    CHLORIDE 101 03/28/2022    CO2 24 03/28/2022    ANIONGAP 10 03/28/2022    BUN 24 03/28/2022    CREATININE 1.01 03/28/2022    BUNCRRATIO 24 (H) 03/28/2022    GFR 56 (L) 03/28/2022    GLUCOSENF 154 (H) 03/28/2022     Lab Results   Component Value Date    CHOLESTEROL 171 01/19/2022    HDLCHOL 51 01/19/2022    LDLCHOL 94 01/19/2022    LDLCHOLDIR 130 11/15/2021    TRIG 147 01/19/2022      HEMOGLOBIN A1C   Date Value Ref Range Status   01/17/2022 6.1 (H) 4.0 - 5.6 % Final   05/24/2021 7.2 (H)  Final     Hemogram   Lab Results   Component Value Date/Time    WBC 18.7 (H) 03/28/2022 05:59 AM    HGB 12.9 03/28/2022 05:59 AM    HCT 38.4 03/28/2022 05:59 AM    PLTCNT 388 03/28/2022 05:59 AM    RBC 4.31 03/28/2022 05:59 AM    MCV 89.1 03/28/2022 05:59 AM    MCHC 33.6 03/28/2022 05:59 AM    MCH 29.9 03/28/2022 05:59 AM    MPV 9.0 03/27/2022 06:20 AM          ECHO  .LASTIMG[[id number]      Cath  Cath Report   INVASIVE CARDIOLOGY PROCEDURE    Impression    1. Mild diffuse disease of the LAD without angiographic evidence ostial    stenosis using steep fluoroscopic angulations.  This was then further   assessed using intravascular imaging with IVUS which revealed no evidence   of significant stenosis.  2. Hazy, eccentric 70-80% stenosis of the mid left circumflex artery   distal to the previously placed stent which was further investigated using   intravascular imaging.  This revealed critical, heavily calcified stenosis   within an inadequate minimal luminal area, status post successful complex   percutaneous coronary intervention with a 3.5 x 15 mm Onyx Frontier   drug-eluting stent which was overlapped proximally with the previously   placed stent.  The entire stented segment was post dilated with a 3.5 x 15   mm NC balloon.  Post PCI angiography as well as intravascular imaging   revealed good results with 0% residual stenosis, no evidence of edge   dissection, and TIMI 3 flow throughout.  There was evidence of adequate   stent expansion and apposition on intravascular imaging.  3. Otherwise nonobstructive coronary artery disease  4. Normal LVEDP of 7 mmHg     RECOMMENDATIONS:  The patient is to undergo standard postprocedure care by the primary   service.  The patient is to continue with aggressive risk factor modification and   medical therapy for coronary artery disease.  The patient was given a loading dose of Plavix 600 mg. The patient is to   continue with dual anti-platelet therapy including aspirin indefinitely   and Plavix for at least 6 months, preferably for 1 year.   The patient is to follow up with her primary care provider and primary   cardiologist in the next 2-4 weeks.   The patient should follow in AMI clinic in 3-7  days following discharge.          Assessment and Plan  Problem List Items Addressed This Visit        Cardiovascular System    CAD (coronary artery disease) - Primary    Relevant Orders    LIPID PANEL    HTN (hypertension)    Hyperlipidemia    Thoracic ascending aortic aneurysm (CMS HCC)       Endocrine     Type 2 diabetes mellitus (CMS HCC)       Other    History of percutaneous coronary intervention    Syncope    Relevant Orders    14 DAY (HOME ENROLLMENT) EXTENDED HOLTER MONITOR(FMT LOCATION ONLY)    Refer to [facility], Jeannetta NapElkins   Other Visit Diagnoses     Carotid stenosis        Relevant Orders    [facility], Jeannetta NapElkins             Orders Placed This Encounter   . LIPID PANEL   . Refer to [facility], Jeannetta NapElkins   . 14 DAY (HOME ENROLLMENT) EXTENDED HOLTER MONITOR(FMT LOCATION ONLY)   . nitroGLYCERIN (NITROSTAT) 0.4 mg Sublingual Tablet, Sublingual   . empagliflozin (JARDIANCE) 10 mg Oral Tablet        I have seen and personally examined this patient. I have personally reviewed, and independently interpreted, all available imaging and clinical information. I then personally formulated and directed the detailed management plan as documented above inclusive of my edits.     My substantive findings and recommendations are:  1. Coronary artery disease status post stent to circumflex February 2023    Patient is on guideline directed medical treatment as tolerated.    Beta blocker on metoprolol  ACE/ARB/ARNI Inhibitor on irbesartan  Cholesterol lowering agent not sure if she taken Repatha as prescribed  Antiplatelet agent on aspirin and Plavix  SGLT2 inhibitor not on Jardiance  Continue to monitor for symptoms.  2.    05/28/22 1015   Medications   May patient stay on anticoagulant and antiplatelet medications for the procedure (if applicable)? Yes  (PCI and DES 01/2022 continue uninterrupted DAPT for at least 6 months if not 12 months)       3. Syncope EGSYS score 3  Discussed loop recorder with patient if event monitor negative  4. Moderate bilateral carotid disease vascular       As part of this service, I spent 30 minutes on the clinical encounter, in independent patient care and counseling without APP overlap. This  substantive direct/indirect care included initial evaluation, review of laboratory, radiology, diagnostic/imaging studies, review of medical record, order entry and coordination of care with other providers.     Please feel free to contact me should you have any questions.     Thank you for your referral to the [facility].    Sincerely,        Thornell SartoriusAbbas Georga Stys, MD

## 2022-06-07 ENCOUNTER — Other Ambulatory Visit (INDEPENDENT_AMBULATORY_CARE_PROVIDER_SITE_OTHER): Payer: Self-pay

## 2022-06-07 DIAGNOSIS — I2510 Atherosclerotic heart disease of native coronary artery without angina pectoris: Secondary | ICD-10-CM

## 2022-06-11 ENCOUNTER — Other Ambulatory Visit (INDEPENDENT_AMBULATORY_CARE_PROVIDER_SITE_OTHER): Payer: Self-pay | Admitting: NURSE PRACTITIONER, FAMILY

## 2022-06-17 NOTE — Result Encounter Note (Signed)
LDL not at goal  Are they taking the repatha 140 mg sq twice a month? Or have some doses been missed?  If compliant with medications and LDL not at goal add bempadoic acid 160 mg once daily repeat lipid and hepatic panel in 2 months

## 2022-06-19 ENCOUNTER — Other Ambulatory Visit: Payer: Self-pay

## 2022-06-19 ENCOUNTER — Telehealth: Payer: Medicare PPO | Admitting: INTERNAL MEDICINE CARDIOVASCULAR DISEASE

## 2022-06-19 DIAGNOSIS — Z789 Other specified health status: Secondary | ICD-10-CM

## 2022-06-19 DIAGNOSIS — E785 Hyperlipidemia, unspecified: Secondary | ICD-10-CM

## 2022-06-19 DIAGNOSIS — I2510 Atherosclerotic heart disease of native coronary artery without angina pectoris: Secondary | ICD-10-CM

## 2022-06-19 DIAGNOSIS — R55 Syncope and collapse: Secondary | ICD-10-CM

## 2022-06-19 MED ORDER — BEMPEDOIC ACID 180 MG TABLET
180.0000 mg | ORAL_TABLET | Freq: Every day | ORAL | 3 refills | Status: AC
Start: 2022-06-19 — End: ?

## 2022-06-19 NOTE — Progress Notes (Signed)
CARDIOLOGY [facility] [place] MEDICAL BUILDING  [address] [place] [address]  [name] [name]  Progress Note    Name: [name] [name] MRN:  [id number]   Date: 06/19/2022 Age: 81 y.o.     Chief Complaint: No chief complaint on file.    Progress Note  TELEMEDICINE DOCUMENTATION:    Patient Location:  MyChart video visit from home address: [address]  [name] [address]    Patient/family aware of provider location:  yes  Patient/family consent for telemedicine:  yes  Examination observed and performed by:  Thornell Sartorius, MD    81 year old lady who has undergone a PCI in February of this year then had a syncopal episode.  She is being seen via telemedicine today.  As sent the would do visit linked to her via text message and e-mail.  Patient indicated that she had 1 episode of chest discomfort that went across the shoulders for which he took a single nitro with complete relief.  She is not had any further dizzy spells or syncopal episodes.  There is no shortness of breath.  She has not taken a vital Signs today.            Patient Active Problem List    Diagnosis   . Thoracic ascending aortic aneurysm (CMS HCC)   . Syncope   . Elevated d-dimer   . Coronary artery disease   . Unstable angina (CMS HCC)   . CAD (coronary artery disease)   . History of percutaneous coronary intervention   . Type 2 diabetes mellitus (CMS HCC)   . Pulmonary HTN (CMS HCC)   . HTN (hypertension)   . Hyperlipidemia   . Hypothyroidism   . Angina, class III (CMS HCC)     Past Medical History:   Diagnosis Date   . Coronary artery disease    . Dyspnea on exertion    . Essential hypertension    . Hyperlipidemia    . PONV (postoperative nausea and vomiting)    . Stented coronary artery    . Type 2 diabetes mellitus (CMS HCC)          Past Surgical History:   Procedure Laterality Date   . CARDIAC CATHETERIZATION     . CATARACT EXTRACTION, BILATERAL Bilateral    . HX COLONOSCOPY     . HX HEART  CATHETERIZATION  2017 and 2018    Stented twice  prox circ  and circ   . HX PARTIAL HYSTERECTOMY      tubes and ovaries left intact   . HX THYROIDECTOMY  1978   . HX WISDOM TEETH EXTRACTION     . KNEE SURGERY  2018    muscle removed   . LAPAROSCOPIC CHOLECYSTECTOMY     . RECONSTRUCTION OF NOSE  1980         Current Outpatient Medications   Medication Sig   . ALPRAZolam (XANAX) 0.5 mg Oral Tablet Take 1 Tablet (0.5 mg total) by mouth Every evening   . amLODIPine (NORVASC) 5 mg Oral Tablet Take 1 Tablet (5 mg total) by mouth Once a day   . aspirin 81 mg Oral Tablet, Chewable Chew 1 Tablet (81 mg total) Once a day   . bempedoic acid 180 mg Oral Tablet Take 1 Tablet (180 mg total) by mouth Once a day   . Cholecalciferol, Vitamin D3, 10 mcg (400 unit) Oral Tablet, Chewable Chew Once a day   .  clopidogreL (PLAVIX) 75 mg Oral Tablet Take 1 Tablet (75 mg total) by mouth Once a day   . empagliflozin (JARDIANCE) 10 mg Oral Tablet Take 1 Tablet (10 mg total) by mouth Once a day   . evolocumab (REPATHA SURECLICK) 140 mg/mL Subcutaneous Pen Injector Inject 1 pen (140 mg) subcutaneously (under the skin) once every 14 days   . ezetimibe (ZETIA) 10 mg Oral Tablet Take 1 Tablet (10 mg total) by mouth Every evening   . fluticasone propionate (FLONASE NASL) by Nasal route   . Irbesartan-Hydrochlorothiazide 300-12.5 mg Oral Tablet Take 300 mg by mouth Once a day   . Isosorbide Mononitrate (IMDUR) 120 mg Oral Tablet Sustained Release 24 hr TAKE 1 TABLET BY MOUTH EVERY MORNING.   Marland Kitchen levothyroxine (SYNTHROID) 112 mcg Oral Tablet Take 1 Tablet (112 mcg total) by mouth Every morning   . metoprolol tartrate (LOPRESSOR) 50 mg Oral Tablet Take 1 Tablet (50 mg total) by mouth Twice daily 50mg  in the AM and 50mg  in PM   . nitroGLYCERIN (NITROSTAT) 0.4 mg Sublingual Tablet, Sublingual PLACE 1 TABLET UNDER TONGUE EVERY 5 MINS, UP TO 3 DOSES AS NEEDED FOR CHEST PAIN   . pantoprazole (PROTONIX) 40 mg Oral Tablet, Delayed Release (E.C.) Take 1 Tablet  (40 mg total) by mouth Once a day   . POTASSIUM-99 ORAL Take by mouth Once a day   . ranolazine (RANEXA) 500 mg Oral Tablet Sustained Release 12 hr Take by mouth Twice daily   . TRADJENTA 5 mg Oral Tablet Take 1 Tablet (5 mg total) by mouth Once a day   . valACYclovir (VALTREX) 500 mg Oral Tablet Take 1 Tablet (500 mg total) by mouth Twice daily   . vitamin B complex (B COMPLEX-VITAMIN B12 ORAL) Take by mouth Once a day     Allergies   Allergen Reactions   . Pneumococcal 23-Valent Polysaccharide Vaccine  Other Adverse Reaction (Add comment)     Severe local swelling   . Tramadol Nausea/ Vomiting     "makes me sick with or without food"   . Iohexol  Other Adverse Reaction (Add comment) and Hives/ Urticaria      Desc: HIVES SEVERAL HRS S/P IV CONTRAST.. [place] LAST SCAN W/ BENADRYL PREMED @ [facility] '01/AC.   Lisinopril  Other Adverse Reaction (Add comment)     Other reaction(s): Cough   . Other Rash     Other reaction(s): Other (See Comments)  Nickle   When teeth were wired in she place, patient obtained an infection and wiring had to be removed,  Also allergic to silver   . Crestor [Rosuvastatin]      Muscle aches   . Lovastatin    . Statin [Atorvastatin]      Muscle ache   . Iv Contrast Hives/ Urticaria   . Nickel Hives/ Urticaria     Family Medical History:     Problem Relation (Age of Onset)    Cardiomyopathy Father    Heart Attack Mother    Heart Disease Sister    Uterine Cancer Mother, Niece          Social History     Tobacco Use   . Smoking status: Former     Packs/day: 0.50     Years: 10.00     Pack years: 5.00     Types: Cigarettes   . Smokeless tobacco: Never   Substance Use Topics   . Alcohol use: Not Currently     Alcohol/week: 1.0 standard  drink     Types: 1 Cans of beer per week     Comment: drank long time ago not current.      Review of Systems  Review of Systems  Examination:  There were no vitals taken for this visit.      Physical Exam    Labs:  Lab Results   Component Value Date    CHOLESTEROL 171  01/19/2022    HDLCHOL 51 01/19/2022    LDLCHOL 94 01/19/2022    LDLCHOLDIR 130 11/15/2021    TRIG 147 01/19/2022     CBC Results   No results for input(s): WBC, HGB, HCT, PLTCNT, BANDS in the last 72 hours.   BMP Results   No results for input(s): SODIUM, POTASSIUM, CHLORIDE, CO2, BUN, CREATININE, GFR, ANIONGAP in the last 72 hours.  No results for input(s): CALCIUM, ALBUMIN, MAGNESIUM in the last 72 hours.   Coag Results     No results for input(s): INR, PROTHROMTME, APTT in the last 72 hours.    Invalid input(s): PTT, PT, CREACTPROTIE   Cardiac Results        No results for input(s): UHCEASTTROPI, CKMB, MBINDEX, BNP in the last 72 hours.     Lab Results   Component Value Date    SODIUM 135 (L) 03/28/2022    POTASSIUM 4.7 03/28/2022    CHLORIDE 101 03/28/2022    CO2 24 03/28/2022    ANIONGAP 10 03/28/2022    BUN 24 03/28/2022    CREATININE 1.01 03/28/2022    BUNCRRATIO 24 (H) 03/28/2022    GFR 56 (L) 03/28/2022    GLUCOSENF 154 (H) 03/28/2022     Lab Results   Component Value Date    CHOLESTEROL 171 01/19/2022    HDLCHOL 51 01/19/2022    LDLCHOL 94 01/19/2022    LDLCHOLDIR 130 11/15/2021    TRIG 147 01/19/2022      HEMOGLOBIN A1C   Date Value Ref Range Status   01/17/2022 6.1 (H) 4.0 - 5.6 % Final   05/24/2021 7.2 (H)  Final     Hemogram   Lab Results   Component Value Date/Time    WBC 18.7 (H) 03/28/2022 05:59 AM    HGB 12.9 03/28/2022 05:59 AM    HCT 38.4 03/28/2022 05:59 AM    PLTCNT 388 03/28/2022 05:59 AM    RBC 4.31 03/28/2022 05:59 AM    MCV 89.1 03/28/2022 05:59 AM    MCHC 33.6 03/28/2022 05:59 AM    MCH 29.9 03/28/2022 05:59 AM    MPV 9.0 03/27/2022 06:20 AM          ECHO  Results for orders placed during the hospital encounter of 01/17/22    TRANSTHORACIC ECHOCARDIOGRAM - ADULT 01/20/2022 11:58 AM    Narrative  **See full report in linked PDF document**  Transthoracic Echocardiographic Report    ______________________________________________________________________________  Name: Jerene CannyDITCH, Carry                            MRN: [id number]               Weight: 163 lb  Study Date: 01/20/2022 09:04 AM              DOB:              Height: 62 in  Gender: Female  Age: 65 yrs                 BSA: 1.8 m2  Accession #: [id number]                    BP: 167/52 mmHg  Patient Location: [facility]  Ordering Provider: Carmela Rima  TechLinton Ham, RTR    ______________________________________________________________________________  Procedure:  Transthoracic complete echo with contrast, 2D, spectral and tissue Doppler, color flow Doppler, M-mode.    Quality:  The study images were of technically adequate quality.    Indications: ACS (acute coronary syndrome) (CMS HCC),CAD (coronary artery disease),Chest pain    Conclusions:  Left Ventricle: Normal left ventricular size. Normal geometry. Left ventricular systolic function is normal. Ejection Fraction is 68.5 %. Hypokinetic mid inferolateral (posterior) wall. Hypokinetic  mid anterolateral wall. Abnormal diastolic function, low filling pressure.  Right Ventricle: The right ventricle is not well visualized. Normal right ventricular systolic function. Right ventricular systolic pressure is normal.    Findings  Left Ventricle:   Normal left ventricular size. Normal geometry. Left ventricular systolic function is normal. Ejection Fraction is 68.5 %. Hypokinetic mid inferolateral (posterior) wall. Hypokinetic  mid anterolateral wall. Abnormal diastolic function, low filling pressure.  Right Ventricle:   The right ventricle is not well visualized. Normal right ventricular systolic function. Right ventricular systolic pressure is normal.  Left Atrium:   The left atrium is normal in size.  Right Atrium:   The right atrium is not well visualized.  Mitral Valve:   The mitral valve is normal.  Tricuspid Valve:   The tricuspid valve is normal. Trace tricuspid regurgitation present.  Aortic Valve:   The aortic valve is not well visualized. Trileaflet  aortic valve. No Aortic valve stenosis.  Pulmonic Valve:   The pulmonic valve is not well visualized.  IVC/Hepatic Veins:   Normal IVC size with >50% inspiratory collapse (estimated RA pressure 3 mmHg).  Aorta:   The aortic root is of normal size. The ascending aorta is normal in size.  Pericardium/Pleural space:   Normal pericardium with no pericardial effusion.    Electronically signed by: MD Susanne Borders on 01/20/2022 11:58 AM      Cath  Cath Report   INVASIVE CARDIOLOGY PROCEDURE    Impression    1. Mild diffuse disease of the LAD without angiographic evidence ostial   stenosis using steep fluoroscopic angulations.  This was then further   assessed using intravascular imaging with IVUS which revealed no evidence   of significant stenosis.  2. Hazy, eccentric 70-80% stenosis of the mid left circumflex artery   distal to the previously placed stent which was further investigated using   intravascular imaging.  This revealed critical, heavily calcified stenosis   within an inadequate minimal luminal area, status post successful complex   percutaneous coronary intervention with a 3.5 x 15 mm Onyx Frontier   drug-eluting stent which was overlapped proximally with the previously   placed stent.  The entire stented segment was post dilated with a 3.5 x 15   mm NC balloon.  Post PCI angiography as well as intravascular imaging   revealed good results with 0% residual stenosis, no evidence of edge   dissection, and TIMI 3 flow throughout.  There was evidence of adequate   stent expansion and apposition on intravascular imaging.  3. Otherwise nonobstructive coronary artery disease  4. Normal LVEDP of 7 mmHg     RECOMMENDATIONS:  The patient is to  undergo standard postprocedure care by the primary   service.  The patient is to continue with aggressive risk factor modification and   medical therapy for coronary artery disease.  The patient was given a loading dose of Plavix 600 mg. The patient is to   continue with dual  anti-platelet therapy including aspirin indefinitely   and Plavix for at least 6 months, preferably for 1 year.   The patient is to follow up with her primary care provider and primary   cardiologist in the next 2-4 weeks.   The patient should follow in AMI clinic in 3-7 days following discharge.          Assessment and Plan  Problem List Items Addressed This Visit        Cardiovascular System    Hyperlipidemia    Coronary artery disease - Primary       Other    Syncope   Other Visit Diagnoses     Statin intolerance                 Orders Placed This Encounter   . bempedoic acid 180 mg Oral Tablet        I have seen and personally examined this patient. I have personally reviewed, and independently interpreted, all available imaging and clinical information. I then personally formulated and directed the detailed management plan as documented above inclusive of my edits.     I have seen and personally examined this patient. I have personally reviewed, and independently interpreted, all available imaging and clinical information. I then personally formulated and directed the detailed management plan as documented above inclusive of my edits.     My substantive findings and recommendations are:  1.           Coronary artery disease status post stent to circumflex February 2023    Patient is on guideline directed medical treatment as tolerated.    Beta blocker on metoprolol  ACE/ARB/ARNI Inhibitor on irbesartan  Cholesterol lowering agent states she has taken Repatha as prescribed follow up LDL is 85 (under media), she had significant issues with statins Rx sent for bempadoic acid today to be taken in addition to repatha and Zetia for a goal LDL of at least less than 70 if not less than 50  Antiplatelet agent on aspirin and Plavix  SGLT2 inhibitor not on Jardiance  Continue to monitor for symptoms.  2.              06/19/22 13:46   Medications   May patient stay on anticoagulant and antiplatelet medications for the  procedure (if applicable)? Yes  (PCI and DES 01/2022 continue uninterrupted DAPT for at least 6 months if not 12 months)       3.           Syncope EGSYS score 3  Discussed loop recorder with patient if event monitor negative  4.           Moderate bilateral carotid disease vascular consult upcoming    5.  It would be preferred that she wait for her knee surgery until at least February of 2024    As part of this service, I spent 40 minutes on the clinical encounter, in independent patient care and counseling without APP overlap. This substantive direct/indirect care included initial evaluation, review of laboratory, radiology, diagnostic/imaging studies, review of medical record, order entry and coordination of care with other providers.     Please feel free to contact  me should you have any questions.     Thank you for your referral to the [facility].    Sincerely,        Leo Grosser, MD

## 2022-06-26 DIAGNOSIS — R55 Syncope and collapse: Secondary | ICD-10-CM

## 2022-06-26 LAB — 14 DAY (HOME ENROLLMENT) EXTENDED HOLTER MONITOR
Heart rate (average): 65 {beats}/min
Isolated SVE count: 369 episodes
Isolated VE Counts: 36 episodes
Longest supraventricular tachycardia episode - duration: 4.5 s
Longest supraventricular tachycardia episode - heart rate (: 116 {beats}/min
Longest supraventricular tachycardia episode - number of be: 9 beats
SVE Couplets Counts: 12 episodes
SVE Triplets Counts: 2 episodes
Supraventricular tachycardia - heart rate (average): 117 {beats}/min
Supraventricular tachycardia - number of episodes: 4
Supraventricular tachycardia with fastest heart rate - dura: 3.3 s
Supraventricular tachycardia with fastest heart rate - hear: 119 {beats}/min
Supraventricular tachycardia with fastest heart rate - numb: 6 beats

## 2022-07-02 NOTE — Result Encounter Note (Signed)
Please let patient know the monitor did not show any significant arrhythmias  As no syncope with monitor on please set up loop recorder if patient agreeable

## 2022-07-03 ENCOUNTER — Other Ambulatory Visit: Payer: Self-pay

## 2022-07-05 ENCOUNTER — Other Ambulatory Visit: Payer: Self-pay

## 2022-07-09 ENCOUNTER — Other Ambulatory Visit: Payer: Self-pay

## 2022-07-10 ENCOUNTER — Other Ambulatory Visit (INDEPENDENT_AMBULATORY_CARE_PROVIDER_SITE_OTHER): Payer: Self-pay | Admitting: INTERNAL MEDICINE CARDIOVASCULAR DISEASE

## 2022-07-10 ENCOUNTER — Other Ambulatory Visit: Payer: Self-pay

## 2022-07-11 ENCOUNTER — Other Ambulatory Visit: Payer: Self-pay

## 2022-07-13 ENCOUNTER — Other Ambulatory Visit (INDEPENDENT_AMBULATORY_CARE_PROVIDER_SITE_OTHER): Payer: Self-pay | Admitting: INTERNAL MEDICINE CARDIOVASCULAR DISEASE

## 2022-07-13 ENCOUNTER — Other Ambulatory Visit: Payer: Self-pay

## 2022-07-13 DIAGNOSIS — R55 Syncope and collapse: Secondary | ICD-10-CM

## 2022-07-13 NOTE — Nursing Note (Signed)
Patient is scheduled for Loop Recorder implant @ [facility] on 07/18/22 @ 1:30PM patient is to arrive to [facility] @ 11:30 PM. Patient is to have labs drawn ASAP. Patient is to eat a light nutritional breakfast, wear loose comfortable clothing and to have someone available to drive them home after procedure. Patient is to continue with all current medications and to be non fasting. Patient notified of all information above and has no further questions or concerns at this time  [place], RN 07/13/2022 13:31      Pt notified with information above and verbalized understanding.   [facility] [place], RN 07/13/2022 13:32

## 2022-07-17 ENCOUNTER — Encounter (HOSPITAL_COMMUNITY): Payer: Self-pay | Admitting: INTERNAL MEDICINE CARDIOVASCULAR DISEASE

## 2022-07-17 ENCOUNTER — Other Ambulatory Visit: Payer: Self-pay

## 2022-07-17 NOTE — Nursing Note (Signed)
COVID-19 Admission Screen    Low Risk:   Able to Provide asymptomatic History  No recent Travel/ Following Stay at Home  No Sick Contacts  No New Household Conatcts    Moderate/High Risk: {None    Test Result:Not Indicated

## 2022-07-18 ENCOUNTER — Encounter (HOSPITAL_COMMUNITY): Payer: Self-pay | Admitting: INTERNAL MEDICINE CARDIOVASCULAR DISEASE

## 2022-07-18 ENCOUNTER — Encounter (HOSPITAL_COMMUNITY)
Admission: RE | Disposition: A | Discharge status: Home / Self Care | Payer: Self-pay | Source: Ambulatory Visit | Attending: INTERNAL MEDICINE CARDIOVASCULAR DISEASE

## 2022-07-18 ENCOUNTER — Other Ambulatory Visit: Payer: Self-pay

## 2022-07-18 ENCOUNTER — Inpatient Hospital Stay
Admission: RE | Admit: 2022-07-18 | Discharge: 2022-07-18 | Disposition: A | Discharge status: Home / Self Care | Payer: Medicare PPO | Source: Ambulatory Visit | Attending: INTERNAL MEDICINE CARDIOVASCULAR DISEASE | Admitting: INTERNAL MEDICINE CARDIOVASCULAR DISEASE

## 2022-07-18 ENCOUNTER — Encounter (HOSPITAL_COMMUNITY): Payer: Medicare PPO | Admitting: INTERNAL MEDICINE CARDIOVASCULAR DISEASE

## 2022-07-18 DIAGNOSIS — I4891 Unspecified atrial fibrillation: Secondary | ICD-10-CM

## 2022-07-18 DIAGNOSIS — R55 Syncope and collapse: Secondary | ICD-10-CM | POA: Insufficient documentation

## 2022-07-18 SURGERY — INSERTION OF INTRA-CARDIAC EVENT MONITOR

## 2022-07-18 MED ORDER — LIDOCAINE-EPINEPHRINE (PF) 2 %-1:200,000 INJECTION SOLUTION
Freq: Once | INTRAMUSCULAR | Status: DC | PRN
Start: 2022-07-18 — End: 2022-07-18
  Administered 2022-07-18 (×2): 10 mL via INTRADERMAL

## 2022-07-18 MED ORDER — LIDOCAINE-EPINEPHRINE (PF) 2 %-1:200,000 INJECTION SOLUTION
INTRAMUSCULAR | Status: AC
Start: 2022-07-18 — End: 2022-07-18
  Filled 2022-07-18: qty 20

## 2022-07-18 SURGICAL SUPPLY — 14 items
APPL 70% ISPRP 2% CHG 26ML 13._2X13.2IN CHLRPRP PREP DEHP-FR (MED SURG SUPPLIES) ×2
APPL 70% ISPRP 2% CHG 26ML CHLRPRP HI-LT ORNG PREP STRL LF  DISP CLR (MED SURG SUPPLIES) ×1 IMPLANT
CONV USE ITEM 339045 - DRAPE PACEMAKER LF  SURG (DRAPE/PACKS/SHEETS/OR TOWEL) ×1 IMPLANT
DEVICE CARDIAC 9.4MMX46.5MM ASSERT-IQ THK3.1MM 3+ ICM ×1 IMPLANT
DRAPE PACEMAKER LF  SURG (DRAPE/PACKS/SHEETS/OR TOWEL) ×1
DRAPE PACEMAKER LF SURG (DRAPE/PACKS/SHEETS/OR TOWEL) ×2
DRAPE REINF FNFLD 90X44IN LF  STRL DISP SURG (DRAPE/PACKS/SHEETS/OR TOWEL) ×1 IMPLANT
DRAPE REINF FNFLD 90X44IN LF_STRL DISP SURG (DRAPE/PACKS/SHEETS/OR TOWEL) ×2
DRESSING ISLAND 4X5 STD_ADH STR (WOUND CARE/ENTEROSTOMAL SUPPLY) ×2
GOWN SURG XL L4 REINF HKLP CLSR BRTHBL FILM SLEEVE STRL LF  DISP BLU SIRUS SMS PE 47IN (DRAPE/PACKS/SHEETS/OR TOWEL) ×1 IMPLANT
GOWN SURG XL L4 REINF HKLP CLS_R BRTHBL FILM SLEEVE STRL LF (DRAPE/PACKS/SHEETS/OR TOWEL) ×2
MBO USE ITEM 401549 - DRESS WOUND 5X4IN TELFA PLSTR COTTON ABS NADH FILM ILND NWVN LF STRL (WOUND CARE SUPPLY) ×1 IMPLANT
SUTURE 3-0 SH VICRYL 27IN VIOL BRD COAT ABS (SUTURE/WOUND CLOSURE) ×1 IMPLANT
SUTURE 3-0 SH VICRYL 27IN VIOL_BRD COAT ABS (SUTURE/WOUND CLOSURE) ×2

## 2022-07-18 NOTE — OR PreOp (Signed)
COVID-19 Admission Screen    Low Risk:  None    Moderate/High Risk: {None    Test Result:Not Indicated

## 2022-07-18 NOTE — H&P (Signed)
CARDIOLOGY [facility] [place], [facility] MEDICAL BUILDING  [address] [place] [address]  [facility]  Progress Note    Name: [name] [name] MRN:  [id number]   Date: 06/19/2022 Age: 81 y.o.     Chief Complaint: No chief complaint on file.    Progress Note  81 year old lady who has undergone a PCI in February of this year then had a syncopal episode.  She is being seen via telemedicine today.  As sent the would do visit linked to her via text message and e-mail.  Patient indicated that she had 1 episode of chest discomfort that went across the shoulders for which he took a single nitro with complete relief.  She is not had any further dizzy spells or syncopal episodes.  There is no shortness of breath.  She has not taken a vital Signs today.          Patient Active Problem List    Diagnosis    Thoracic ascending aortic aneurysm (CMS HCC)    Syncope    Elevated d-dimer    Coronary artery disease    Unstable angina (CMS HCC)    CAD (coronary artery disease)    History of percutaneous coronary intervention    Type 2 diabetes mellitus (CMS HCC)    Pulmonary HTN (CMS HCC)    HTN (hypertension)    Hyperlipidemia    Hypothyroidism    Angina, class III (CMS HCC)     Past Medical History:   Diagnosis Date    Coronary artery disease     Dyspnea on exertion     Essential hypertension     Hyperlipidemia     PONV (postoperative nausea and vomiting)     Stented coronary artery     Type 2 diabetes mellitus (CMS HCC)          Past Surgical History:   Procedure Laterality Date    CARDIAC CATHETERIZATION      CATARACT EXTRACTION, BILATERAL Bilateral     HX COLONOSCOPY      HX HEART CATHETERIZATION  2017 and 2018    Stented twice  prox circ  and circ    HX PARTIAL HYSTERECTOMY      tubes and ovaries left intact    HX THYROIDECTOMY  1978    HX WISDOM TEETH EXTRACTION      KNEE SURGERY  2018    muscle removed    LAPAROSCOPIC CHOLECYSTECTOMY      RECONSTRUCTION OF NOSE  1980         Current  Outpatient Medications   Medication Sig    ALPRAZolam (XANAX) 0.5 mg Oral Tablet Take 1 Tablet (0.5 mg total) by mouth Every evening    amLODIPine (NORVASC) 5 mg Oral Tablet Take 1 Tablet (5 mg total) by mouth Once a day    aspirin 81 mg Oral Tablet, Chewable Chew 1 Tablet (81 mg total) Once a day    bempedoic acid 180 mg Oral Tablet Take 1 Tablet (180 mg total) by mouth Once a day    Cholecalciferol, Vitamin D3, 10 mcg (400 unit) Oral Tablet, Chewable Chew Once a day    clopidogreL (PLAVIX) 75 mg Oral Tablet Take 1 Tablet (75 mg total) by mouth Once a day    empagliflozin (JARDIANCE) 10 mg Oral Tablet Take 1 Tablet (10 mg total) by mouth Once a day    evolocumab (REPATHA SURECLICK) 140 mg/mL Subcutaneous Pen Injector Inject 1 pen (140 mg)  subcutaneously (under the skin) once every 14 days    ezetimibe (ZETIA) 10 mg Oral Tablet Take 1 Tablet (10 mg total) by mouth Every evening    fluticasone propionate (FLONASE NASL) by Nasal route    Irbesartan-Hydrochlorothiazide 300-12.5 mg Oral Tablet Take 300 mg by mouth Once a day    Isosorbide Mononitrate (IMDUR) 120 mg Oral Tablet Sustained Release 24 hr TAKE 1 TABLET BY MOUTH EVERY MORNING.    levothyroxine (SYNTHROID) 112 mcg Oral Tablet Take 1 Tablet (112 mcg total) by mouth Every morning    metoprolol tartrate (LOPRESSOR) 50 mg Oral Tablet Take 1 Tablet (50 mg total) by mouth Twice daily 50mg  in the AM and 50mg  in PM    nitroGLYCERIN (NITROSTAT) 0.4 mg Sublingual Tablet, Sublingual PLACE 1 TABLET UNDER TONGUE EVERY 5 MINS, UP TO 3 DOSES AS NEEDED FOR CHEST PAIN    pantoprazole (PROTONIX) 40 mg Oral Tablet, Delayed Release (E.C.) Take 1 Tablet (40 mg total) by mouth Once a day    POTASSIUM-99 ORAL Take by mouth Once a day    ranolazine (RANEXA) 500 mg Oral Tablet Sustained Release 12 hr Take by mouth Twice daily    TRADJENTA 5 mg Oral Tablet Take 1 Tablet (5 mg total) by mouth Once a day    valACYclovir (VALTREX) 500 mg Oral Tablet Take 1 Tablet (500 mg total) by mouth  Twice daily    vitamin B complex (B COMPLEX-VITAMIN B12 ORAL) Take by mouth Once a day     Allergies   Allergen Reactions    Pneumococcal 23-Valent Polysaccharide Vaccine  Other Adverse Reaction (Add comment)     Severe local swelling    Tramadol Nausea/ Vomiting     "makes me sick with or without food"    Iohexol  Other Adverse Reaction (Add comment) and Hives/ Urticaria      Desc: HIVES SEVERAL HRS S/P IV CONTRAST.. [place] LAST SCAN W/ BENADRYL PREMED @ [facility] '01/AC.    Lisinopril  Other Adverse Reaction (Add comment)     Other reaction(s): Cough    Other Rash     Other reaction(s): Other (See Comments)  Nickle   When teeth were wired in she place, patient obtained an infection and wiring had to be removed,  Also allergic to silver    Crestor [Rosuvastatin]      Muscle aches    Lovastatin     Statin [Atorvastatin]      Muscle ache    Iv Contrast Hives/ Urticaria    Nickel Hives/ Urticaria     Family Medical History:       Problem Relation (Age of Onset)    Cardiomyopathy Father    Heart Attack Mother    Heart Disease Sister    Uterine Cancer Mother, Niece            Social History     Tobacco Use    Smoking status: Former     Packs/day: 0.50     Years: 10.00     Pack years: 5.00     Types: Cigarettes    Smokeless tobacco: Never   Substance Use Topics    Alcohol use: Not Currently     Alcohol/week: 1.0 standard drink     Types: 1 Cans of beer per week     Comment: drank long time ago not current.      Review of Systems  Review of Systems  Examination:  There were no vitals taken for this visit.  Physical Exam    Labs:  Lab Results   Component Value Date    CHOLESTEROL 171 01/19/2022    HDLCHOL 51 01/19/2022    LDLCHOL 94 01/19/2022    LDLCHOLDIR 130 11/15/2021    TRIG 147 01/19/2022     CBC Results   No results for input(s): WBC, HGB, HCT, PLTCNT, BANDS in the last 72 hours.   BMP Results   No results for input(s): SODIUM, POTASSIUM, CHLORIDE, CO2, BUN, CREATININE, GFR, ANIONGAP in the last 72 hours.  No results for  input(s): CALCIUM, ALBUMIN, MAGNESIUM in the last 72 hours.   Coag Results     No results for input(s): INR, PROTHROMTME, APTT in the last 72 hours.    Invalid input(s): PTT, PT, CREACTPROTIE   Cardiac Results        No results for input(s): UHCEASTTROPI, CKMB, MBINDEX, BNP in the last 72 hours.     Lab Results   Component Value Date    SODIUM 135 (L) 03/28/2022    POTASSIUM 4.7 03/28/2022    CHLORIDE 101 03/28/2022    CO2 24 03/28/2022    ANIONGAP 10 03/28/2022    BUN 24 03/28/2022    CREATININE 1.01 03/28/2022    BUNCRRATIO 24 (H) 03/28/2022    GFR 56 (L) 03/28/2022    GLUCOSENF 154 (H) 03/28/2022     Lab Results   Component Value Date    CHOLESTEROL 171 01/19/2022    HDLCHOL 51 01/19/2022    LDLCHOL 94 01/19/2022    LDLCHOLDIR 130 11/15/2021    TRIG 147 01/19/2022      HEMOGLOBIN A1C   Date Value Ref Range Status   01/17/2022 6.1 (H) 4.0 - 5.6 % Final   05/24/2021 7.2 (H)  Final     Hemogram   Lab Results   Component Value Date/Time    WBC 18.7 (H) 03/28/2022 05:59 AM    HGB 12.9 03/28/2022 05:59 AM    HCT 38.4 03/28/2022 05:59 AM    PLTCNT 388 03/28/2022 05:59 AM    RBC 4.31 03/28/2022 05:59 AM    MCV 89.1 03/28/2022 05:59 AM    MCHC 33.6 03/28/2022 05:59 AM    MCH 29.9 03/28/2022 05:59 AM    MPV 9.0 03/27/2022 06:20 AM          ECHO  Results for orders placed during the hospital encounter of 01/17/22    TRANSTHORACIC ECHOCARDIOGRAM - ADULT 01/20/2022 11:58 AM    Narrative  **See full report in linked PDF document**  Transthoracic Echocardiographic Report    ______________________________________________________________________________  Name: MIRENDA, BALTAZAR                           MRN: [id number]               Weight: 163 lb  Study Date: 01/20/2022 09:04 AM              DOB:              Height: 62 in  Gender: Female                               Age: 58 yrs                 BSA: 1.8 m2  Accession #: [id number]  BP: 167/52 mmHg  Patient Location: [facility]   Ordering Provider:  Carmela Rima  TechLinton Ham, RTR    ______________________________________________________________________________  Procedure:  Transthoracic complete echo with contrast, 2D, spectral and tissue Doppler, color flow Doppler, M-mode.    Quality:  The study images were of technically adequate quality.    Indications: ACS (acute coronary syndrome) (CMS HCC),CAD (coronary artery disease),Chest pain    Conclusions:  Left Ventricle: Normal left ventricular size. Normal geometry. Left ventricular systolic function is normal. Ejection Fraction is 68.5 %. Hypokinetic mid inferolateral (posterior) wall. Hypokinetic  mid anterolateral wall. Abnormal diastolic function, low filling pressure.  Right Ventricle: The right ventricle is not well visualized. Normal right ventricular systolic function. Right ventricular systolic pressure is normal.    Findings  Left Ventricle:   Normal left ventricular size. Normal geometry. Left ventricular systolic function is normal. Ejection Fraction is 68.5 %. Hypokinetic mid inferolateral (posterior) wall. Hypokinetic  mid anterolateral wall. Abnormal diastolic function, low filling pressure.  Right Ventricle:   The right ventricle is not well visualized. Normal right ventricular systolic function. Right ventricular systolic pressure is normal.  Left Atrium:   The left atrium is normal in size.  Right Atrium:   The right atrium is not well visualized.  Mitral Valve:   The mitral valve is normal.  Tricuspid Valve:   The tricuspid valve is normal. Trace tricuspid regurgitation present.  Aortic Valve:   The aortic valve is not well visualized. Trileaflet aortic valve. No Aortic valve stenosis.  Pulmonic Valve:   The pulmonic valve is not well visualized.  IVC/Hepatic Veins:   Normal IVC size with >50% inspiratory collapse (estimated RA pressure 3 mmHg).  Aorta:   The aortic root is of normal size. The ascending aorta is normal in size.  Pericardium/Pleural space:   Normal pericardium with no  pericardial effusion.    Electronically signed by: MD Susanne Borders on 01/20/2022 11:58 AM      Cath  Cath Report   INVASIVE CARDIOLOGY PROCEDURE    Impression    1. Mild diffuse disease of the LAD without angiographic evidence ostial   stenosis using steep fluoroscopic angulations.  This was then further   assessed using intravascular imaging with IVUS which revealed no evidence   of significant stenosis.  2. Hazy, eccentric 70-80% stenosis of the mid left circumflex artery   distal to the previously placed stent which was further investigated using   intravascular imaging.  This revealed critical, heavily calcified stenosis   within an inadequate minimal luminal area, status post successful complex   percutaneous coronary intervention with a 3.5 x 15 mm Onyx Frontier   drug-eluting stent which was overlapped proximally with the previously   placed stent.  The entire stented segment was post dilated with a 3.5 x 15   mm NC balloon.  Post PCI angiography as well as intravascular imaging   revealed good results with 0% residual stenosis, no evidence of edge   dissection, and TIMI 3 flow throughout.  There was evidence of adequate   stent expansion and apposition on intravascular imaging.  3. Otherwise nonobstructive coronary artery disease  4. Normal LVEDP of 7 mmHg     RECOMMENDATIONS:  The patient is to undergo standard postprocedure care by the primary   service.  The patient is to continue with aggressive risk factor modification and   medical therapy for coronary artery disease.  The patient was given a loading dose of Plavix 600 mg. The patient  is to   continue with dual anti-platelet therapy including aspirin indefinitely   and Plavix for at least 6 months, preferably for 1 year.   The patient is to follow up with her primary care provider and primary   cardiologist in the next 2-4 weeks.   The patient should follow in AMI clinic in 3-7 days following discharge.          Assessment and Plan  Problem List  Items Addressed This Visit          Cardiovascular System    Hyperlipidemia    Coronary artery disease - Primary       Other    Syncope     Other Visit Diagnoses       Statin intolerance                   Orders Placed This Encounter    bempedoic acid 180 mg Oral Tablet        I have seen and personally examined this patient. I have personally reviewed, and independently interpreted, all available imaging and clinical information. I then personally formulated and directed the detailed management plan as documented above inclusive of my edits.     I have seen and personally examined this patient. I have personally reviewed, and independently interpreted, all available imaging and clinical information. I then personally formulated and directed the detailed management plan as documented above inclusive of my edits.      My substantive findings and recommendations are:  1.           Coronary artery disease status post stent to circumflex February 2023     Patient is on guideline directed medical treatment as tolerated.    Beta blocker on metoprolol  ACE/ARB/ARNI Inhibitor on irbesartan  Cholesterol lowering agent states she has taken Repatha as prescribed follow up LDL is 85 (under media), she had significant issues with statins Rx sent for bempadoic acid today to be taken in addition to repatha and Zetia for a goal LDL of at least less than 70 if not less than 50  Antiplatelet agent on aspirin and Plavix  SGLT2 inhibitor not on Jardiance  Continue to monitor for symptoms.  2.               06/19/22 13:46   Medications   May patient stay on anticoagulant and antiplatelet medications for the procedure (if applicable)? Yes  (PCI and DES 01/2022 continue uninterrupted DAPT for at least 6 months if not 12 months)         3.           Syncope EGSYS score 3  Discussed loop recorder with patient if event monitor negative  4.           Moderate bilateral carotid disease vascular consult upcoming     5.  It would be preferred  that she wait for her knee surgery until at least February of 2024    Risks and benefits of loop recorder implant discussed with patient she verbalized understanding agreed to proceed  As part of this service, I spent 40 minutes on the clinical encounter, in independent patient care and counseling without APP overlap. This substantive direct/indirect care included initial evaluation, review of laboratory, radiology, diagnostic/imaging studies, review of medical record, order entry and coordination of care with other providers.     Please feel free to contact me should you have any questions.     Thank you for  your referral to the [place].    Sincerely,        Leo Grosser, MD

## 2022-07-18 NOTE — Discharge Instructions (Signed)
Hospital operator [phone]

## 2022-07-20 ENCOUNTER — Other Ambulatory Visit: Payer: Self-pay

## 2022-07-20 ENCOUNTER — Encounter (INDEPENDENT_AMBULATORY_CARE_PROVIDER_SITE_OTHER): Payer: Self-pay | Admitting: Vascular Surgery

## 2022-07-20 ENCOUNTER — Encounter (INDEPENDENT_AMBULATORY_CARE_PROVIDER_SITE_OTHER): Payer: Self-pay | Admitting: INTERNAL MEDICINE CARDIOVASCULAR DISEASE

## 2022-07-20 ENCOUNTER — Ambulatory Visit (INDEPENDENT_AMBULATORY_CARE_PROVIDER_SITE_OTHER): Payer: Medicare PPO | Admitting: Vascular Surgery

## 2022-07-20 DIAGNOSIS — R55 Syncope and collapse: Secondary | ICD-10-CM

## 2022-07-20 DIAGNOSIS — I6523 Occlusion and stenosis of bilateral carotid arteries: Secondary | ICD-10-CM

## 2022-07-20 DIAGNOSIS — I6529 Occlusion and stenosis of unspecified carotid artery: Secondary | ICD-10-CM

## 2022-07-20 DIAGNOSIS — Z8673 Personal history of transient ischemic attack (TIA), and cerebral infarction without residual deficits: Secondary | ICD-10-CM

## 2022-07-20 NOTE — Progress Notes (Signed)
[facility]  Clinic History and Physical    Date: 07/20/2022  Patient: [name] [name]  DOB:   PCP: Marko Stai, PA-C    Chief Complaint:   Chief Complaint   Patient presents with    New Patient    Leg Pain       Subjective:     HPI: [name] [name] is a 81 y.o. White female who presents for evaluation of carotid stenosis. She has a history significant for CAD s/p stent x 3 in February 2023 and had a syncopal event at church in April 2023. She recently had a loop recorder placed to monitor for arrhythmia because of this event. During her most recent work up, she was found to have right side 60% stenosis of her carotid artery and >70% of her left carotid artery on CTA, however her ultrasound demonstrated 50-69% stenosis of the right and <50% stenosis of the left (by my interpretation) , and my interpretation of her CTA images is <50% stenosis bilaterally. The patient states that she does have a history of a stroke in 2012- she states she had gotten up to use the bathroom at night and she suddenly couldn't use her legs and she had a terrible headache. She states she was found to be very hypertensive and had a right sided stroke at that time (she was also found to have CAD and received her first heart stent at that time). She states she has residual left sided facial numbness/weakness since that time, but denies any other events since.     PMH:   Past Medical History:   Diagnosis Date    Coronary artery disease     CVA (cerebral vascular accident) (CMS HCC) 2009    Dyspnea on exertion     Essential hypertension     Hyperlipidemia     PONV (postoperative nausea and vomiting)     Stented coronary artery     Type 2 diabetes mellitus (CMS HCC)            Family Hx:  Family Medical History:       Problem Relation (Age of Onset)    Cardiomyopathy Father    Heart Attack Mother    Heart Disease Sister    Uterine Cancer Mother, Niece              Medications:  Current Outpatient Medications   Medication  Sig Dispense Refill    ALPRAZolam (XANAX) 0.5 mg Oral Tablet Take 1 Tablet (0.5 mg total) by mouth Every evening      amLODIPine (NORVASC) 5 mg Oral Tablet Take 1 Tablet (5 mg total) by mouth Once a day 60 Tablet 2    aspirin 81 mg Oral Tablet, Chewable Chew 1 Tablet (81 mg total) Once a day 90 Tablet 3    bempedoic acid 180 mg Oral Tablet Take 1 Tablet (180 mg total) by mouth Once a day 90 Tablet 3    Cholecalciferol, Vitamin D3, 10 mcg (400 unit) Oral Tablet, Chewable Chew Once a day      clopidogreL (PLAVIX) 75 mg Oral Tablet Take 1 Tablet (75 mg total) by mouth Once a day      empagliflozin (JARDIANCE) 10 mg Oral Tablet Take 1 Tablet (10 mg total) by mouth Once a day 90 Tablet 4    evolocumab (REPATHA SURECLICK) 140 mg/mL Subcutaneous Pen Injector Inject 1 pen (140 mg) subcutaneously (under the skin) once every 14 days 2 mL 12    ezetimibe (  ZETIA) 10 mg Oral Tablet Take 1 Tablet (10 mg total) by mouth Every evening 90 Tablet 3    fluticasone propionate (FLONASE NASL) by Nasal route      Irbesartan-Hydrochlorothiazide 300-12.5 mg Oral Tablet Take 300 mg by mouth Once a day      Isosorbide Mononitrate (IMDUR) 120 mg Oral Tablet Sustained Release 24 hr TAKE 1 TABLET BY MOUTH EVERY MORNING. 90 Tablet 3    levothyroxine (SYNTHROID) 112 mcg Oral Tablet Take 1 Tablet (112 mcg total) by mouth Every morning      metoprolol tartrate (LOPRESSOR) 50 mg Oral Tablet Take 1 Tablet (50 mg total) by mouth Twice daily 50mg  in the AM and 50mg  in PM 180 Tablet 3    nitroGLYCERIN (NITROSTAT) 0.4 mg Sublingual Tablet, Sublingual PLACE 1 TABLET UNDER TONGUE EVERY 5 MINS, UP TO 3 DOSES AS NEEDED FOR CHEST PAIN 30 Tablet 3    pantoprazole (PROTONIX) 40 mg Oral Tablet, Delayed Release (E.C.) Take 1 Tablet (40 mg total) by mouth Once a day      POTASSIUM-99 ORAL Take by mouth Once a day      ranolazine (RANEXA) 500 mg Oral Tablet Sustained Release 12 hr TAKE 1 TABLET (500 MG TOTAL) BY MOUTH TWICE DAILY FOR 90 DAYS 180 Tablet 3     valACYclovir (VALTREX) 500 mg Oral Tablet Take 1 Tablet (500 mg total) by mouth Twice daily      vitamin B complex (B COMPLEX-VITAMIN B12 ORAL) Take by mouth Once a day       No current facility-administered medications for this visit.       Allergies:  Allergies   Allergen Reactions    Pneumococcal 23-Valent Polysaccharide Vaccine  Other Adverse Reaction (Add comment)     Severe local swelling    Tramadol Nausea/ Vomiting     "makes me sick with or without food"    Iohexol  Other Adverse Reaction (Add comment) and Hives/ Urticaria      Desc: HIVES SEVERAL HRS S/P IV CONTRAST.. [place] LAST SCAN W/ BENADRYL PREMED @ [facility] '01/AC.    Lisinopril  Other Adverse Reaction (Add comment)     Other reaction(s): Cough    Other Rash     Other reaction(s): Other (See Comments)  Nickle   When teeth were wired in she place, patient obtained an infection and wiring had to be removed,  Also allergic to silver    Crestor [Rosuvastatin]      Muscle aches    Lovastatin     Statin [Atorvastatin]      Muscle ache    Iv Contrast Hives/ Urticaria    Nickel Hives/ Urticaria       Past Surgical Hx:  Past Surgical History:   Procedure Laterality Date    CARDIAC CATHETERIZATION      CATARACT EXTRACTION, BILATERAL Bilateral     HX COLONOSCOPY      HX HEART CATHETERIZATION  2017 and 2018    Stented twice  prox circ  and circ    HX PARTIAL HYSTERECTOMY      tubes and ovaries left intact    HX THYROIDECTOMY  1978    HX WISDOM TEETH EXTRACTION      KNEE SURGERY  2018    muscle removed    LAPAROSCOPIC CHOLECYSTECTOMY      RECONSTRUCTION OF NOSE  1980           Social Hx:  Social History     Socioeconomic History    Marital  status: Married     Spouse name: Not on file    Number of children: Not on file    Years of education: Not on file    Highest education level: Not on file   Occupational History    Not on file   Tobacco Use    Smoking status: Former     Packs/day: 0.50     Years: 10.00     Pack years: 5.00     Types: Cigarettes    Smokeless tobacco:  Never   Vaping Use    Vaping Use: Never used   Substance and Sexual Activity    Alcohol use: Not Currently     Alcohol/week: 1.0 standard drink     Types: 1 Cans of beer per week     Comment: drank long time ago not current.    Drug use: Never    Sexual activity: Not on file   Other Topics Concern    Ability to Walk 1 Flight of Steps without SOB/CP No    Routine Exercise Not Asked    Ability to Walk 2 Flight of Steps without SOB/CP Not Asked    Unable to Ambulate Not Asked    Total Care Not Asked    Ability To Do Own ADL's Yes    Uses Walker Not Asked    Other Activity Level Not Asked    Uses Cane Not Asked   Social History Narrative    Not on file     Social Determinants of Health     Financial Resource Strain: Not on file   Transportation Needs: Not on file   Social Connections: Not on file   Intimate Partner Violence: Not on file   Housing Stability: Not on file       Review of Systems:  Constitutional: negative for fevers, chills, sweats and fatigue  Eyes: negative for blurred vision or field defects  HEENT: negative for hearing loss  Respiratory: negative for cough and shortness of breath  Cardiovascular: negative for chest pain, negative for claudication  Gastrointestinal: negative for post prandial pain  Genitourinary: negative for dysuria, continues to urinate  Musculoskeletal: As in HPI  Neurological: negative for unilateral weakness or monocular blindness  Integumentary: negative for wounds or ulcers    Objective:    PHYSICAL EXAM:  Vitals: BP 122/62 (Site: Right)   Pulse 55   Ht 1.6 m (5\' 3" )   Wt 72.1 kg (159 lb)   SpO2 97% Comment: ra  BMI 28.17 kg/m       General: AA&O X3 Well developed and well nourished in no acute distress   HENT: Head is normocephalic, atraumatic   Eyes: Conjunctiva clear., Pupils equal and round. , Sclera non-icteric. EOMI  Neck: Normal ROM, Supple, symmetrical  Lungs: Effort normal, clear to auscultation bilaterally.   Cardiovascular: Heart regular rate and rhythm, S1, S2  normal, no murmur, click, rub or gallop  Vascular:  no carotid bruits  Left dorsalis pedis artery:  2+ (normal), Right dorsalis pedis artery:  2+ (normal),    Left posterior tibial artery: 2+ (normal) Right posterior tibial artery:  2+ (normal)   Extremities: scattered telangiectasia and reticular veins, L>R without significant edema  Skin: No skin changes  Neurologic: CN II-XII intact except for left facial droop  Psychiatric: normal affect, behavior, memory, thought content, judgement, and speech    DATA:   I independently reviewed and interpreted the images.    DIAGNOSTIC STUDIES REVIEWED:  Carotid duplex 03/2022: per  my interpretation: right 50-69% stenosis, left <50% stenosis  CTA 03/2022: per my interpretation - <50% stenosis bilaterally  MRI brain 2012: acute very small posterior fronto parietal stroke      Assessment:  81 year old female with carotid stenosis - asymptomatic - moderate on the right and mild on the left    Plan:  Follow up in one year with carotid duplex  Continue GDMT for CAD    Patient was given the opportunity to ask questions and those questions were answered to their satisfaction. Instructed to call with any further questions or concerns.     Troy Sine, MD

## 2022-07-20 NOTE — Nursing Note (Signed)
Pt in office today for vascular appointment and also completed a wound check s/p loop implant w/ Dr Karie Mainland on 07/18/22. Incision is well approximated w/ no s/s of infection. Pt denies any fever/chills. Picture in media for reference. Pt placed on recall list for vascular and cardiology f/u.  Lorane Gell, RN 07/20/2022 1:16 PM

## 2022-07-21 ENCOUNTER — Other Ambulatory Visit (INDEPENDENT_AMBULATORY_CARE_PROVIDER_SITE_OTHER): Payer: Self-pay

## 2022-08-03 ENCOUNTER — Other Ambulatory Visit (INDEPENDENT_AMBULATORY_CARE_PROVIDER_SITE_OTHER): Payer: Self-pay | Admitting: Vascular & Interventional Radiology

## 2022-08-03 DIAGNOSIS — I7121 Aneurysm of the ascending aorta, without rupture (CMS HCC): Secondary | ICD-10-CM

## 2022-08-14 ENCOUNTER — Other Ambulatory Visit (INDEPENDENT_AMBULATORY_CARE_PROVIDER_SITE_OTHER): Payer: Self-pay | Admitting: Vascular & Interventional Radiology

## 2022-08-14 ENCOUNTER — Other Ambulatory Visit: Payer: Self-pay

## 2022-08-14 DIAGNOSIS — I7121 Aneurysm of the ascending aorta, without rupture (CMS HCC): Secondary | ICD-10-CM

## 2022-08-19 ENCOUNTER — Encounter (INDEPENDENT_AMBULATORY_CARE_PROVIDER_SITE_OTHER): Payer: Medicare PPO | Admitting: INTERNAL MEDICINE CARDIOVASCULAR DISEASE

## 2022-08-19 DIAGNOSIS — Z95818 Presence of other cardiac implants and grafts: Secondary | ICD-10-CM

## 2022-09-13 ENCOUNTER — Other Ambulatory Visit (INDEPENDENT_AMBULATORY_CARE_PROVIDER_SITE_OTHER): Payer: Self-pay | Admitting: INTERNAL MEDICINE CARDIOVASCULAR DISEASE

## 2022-09-13 MED ORDER — REPATHA SURECLICK 140 MG/ML SUBCUTANEOUS PEN INJECTOR
PEN_INJECTOR | SUBCUTANEOUS | 12 refills | Status: DC
Start: 2022-09-13 — End: 2022-10-08

## 2022-09-19 ENCOUNTER — Encounter (INDEPENDENT_AMBULATORY_CARE_PROVIDER_SITE_OTHER): Payer: Medicare PPO | Admitting: INTERNAL MEDICINE CARDIOVASCULAR DISEASE

## 2022-09-19 DIAGNOSIS — Z95818 Presence of other cardiac implants and grafts: Secondary | ICD-10-CM

## 2022-09-21 ENCOUNTER — Other Ambulatory Visit (INDEPENDENT_AMBULATORY_CARE_PROVIDER_SITE_OTHER): Payer: Self-pay | Admitting: INTERNAL MEDICINE CARDIOVASCULAR DISEASE

## 2022-10-08 ENCOUNTER — Other Ambulatory Visit: Payer: Self-pay

## 2022-10-08 ENCOUNTER — Other Ambulatory Visit (HOSPITAL_COMMUNITY): Payer: Self-pay | Admitting: Physician Assistant

## 2022-10-08 MED ORDER — REPATHA SURECLICK 140 MG/ML SUBCUTANEOUS PEN INJECTOR
PEN_INJECTOR | SUBCUTANEOUS | 12 refills | Status: DC
Start: 2022-10-08 — End: 2023-10-10
  Filled 2022-10-09: qty 2, 28d supply, fill #0
  Filled 2022-10-31 – 2023-01-29 (×4): qty 6, 84d supply, fill #0
  Filled 2023-04-22: qty 6, 84d supply, fill #1
  Filled 2023-07-19 – 2023-07-29 (×3): qty 6, 84d supply, fill #2

## 2022-10-09 ENCOUNTER — Other Ambulatory Visit: Payer: Self-pay

## 2022-10-20 ENCOUNTER — Encounter (INDEPENDENT_AMBULATORY_CARE_PROVIDER_SITE_OTHER): Payer: Medicare PPO | Admitting: INTERNAL MEDICINE CARDIOVASCULAR DISEASE

## 2022-10-20 DIAGNOSIS — Z95818 Presence of other cardiac implants and grafts: Secondary | ICD-10-CM

## 2022-10-29 ENCOUNTER — Other Ambulatory Visit (INDEPENDENT_AMBULATORY_CARE_PROVIDER_SITE_OTHER): Payer: Self-pay | Admitting: INTERNAL MEDICINE CARDIOVASCULAR DISEASE

## 2022-10-31 ENCOUNTER — Other Ambulatory Visit: Payer: Self-pay

## 2022-11-02 ENCOUNTER — Other Ambulatory Visit: Payer: Self-pay

## 2022-11-06 ENCOUNTER — Other Ambulatory Visit: Payer: Self-pay

## 2022-11-08 ENCOUNTER — Other Ambulatory Visit: Payer: Self-pay

## 2022-11-12 ENCOUNTER — Encounter (INDEPENDENT_AMBULATORY_CARE_PROVIDER_SITE_OTHER): Payer: Self-pay | Admitting: Vascular & Interventional Radiology

## 2022-11-12 ENCOUNTER — Other Ambulatory Visit: Payer: Self-pay

## 2022-11-12 ENCOUNTER — Ambulatory Visit (HOSPITAL_COMMUNITY): Payer: Self-pay

## 2022-11-17 ENCOUNTER — Other Ambulatory Visit: Payer: Self-pay

## 2022-11-20 ENCOUNTER — Encounter (INDEPENDENT_AMBULATORY_CARE_PROVIDER_SITE_OTHER): Payer: Medicare PPO | Admitting: INTERNAL MEDICINE CARDIOVASCULAR DISEASE

## 2022-11-20 DIAGNOSIS — Z95818 Presence of other cardiac implants and grafts: Secondary | ICD-10-CM

## 2022-11-21 ENCOUNTER — Ambulatory Visit (HOSPITAL_COMMUNITY): Payer: Self-pay | Admitting: Cardiovascular Disease

## 2022-11-21 ENCOUNTER — Ambulatory Visit (HOSPITAL_COMMUNITY): Payer: Self-pay

## 2022-11-23 ENCOUNTER — Encounter (INDEPENDENT_AMBULATORY_CARE_PROVIDER_SITE_OTHER): Payer: Self-pay | Admitting: INTERNAL MEDICINE CARDIOVASCULAR DISEASE

## 2022-11-23 ENCOUNTER — Other Ambulatory Visit (INDEPENDENT_AMBULATORY_CARE_PROVIDER_SITE_OTHER): Payer: Self-pay | Admitting: INTERNAL MEDICINE CARDIOVASCULAR DISEASE

## 2022-12-09 ENCOUNTER — Other Ambulatory Visit (HOSPITAL_COMMUNITY): Payer: Self-pay | Admitting: Family

## 2022-12-21 ENCOUNTER — Other Ambulatory Visit (INDEPENDENT_AMBULATORY_CARE_PROVIDER_SITE_OTHER): Payer: Self-pay | Admitting: INTERNAL MEDICINE CARDIOVASCULAR DISEASE

## 2022-12-21 ENCOUNTER — Encounter (INDEPENDENT_AMBULATORY_CARE_PROVIDER_SITE_OTHER): Payer: Medicare PPO | Admitting: INTERNAL MEDICINE CARDIOVASCULAR DISEASE

## 2022-12-21 DIAGNOSIS — Z95818 Presence of other cardiac implants and grafts: Secondary | ICD-10-CM

## 2023-01-18 ENCOUNTER — Other Ambulatory Visit: Payer: Self-pay

## 2023-01-21 ENCOUNTER — Encounter (INDEPENDENT_AMBULATORY_CARE_PROVIDER_SITE_OTHER): Payer: Medicare PPO | Admitting: INTERNAL MEDICINE CARDIOVASCULAR DISEASE

## 2023-01-21 ENCOUNTER — Other Ambulatory Visit: Payer: Self-pay

## 2023-01-21 DIAGNOSIS — R55 Syncope and collapse: Secondary | ICD-10-CM

## 2023-01-21 DIAGNOSIS — Z95818 Presence of other cardiac implants and grafts: Secondary | ICD-10-CM

## 2023-01-23 ENCOUNTER — Encounter (INDEPENDENT_AMBULATORY_CARE_PROVIDER_SITE_OTHER): Payer: Self-pay | Admitting: Physician Assistant

## 2023-01-23 ENCOUNTER — Ambulatory Visit (INDEPENDENT_AMBULATORY_CARE_PROVIDER_SITE_OTHER): Payer: Medicare PPO | Admitting: Physician Assistant

## 2023-01-23 ENCOUNTER — Other Ambulatory Visit: Payer: Self-pay

## 2023-01-23 VITALS — BP 156/60 | HR 69 | Ht 62.5 in | Wt 159.0 lb

## 2023-01-23 DIAGNOSIS — Z7982 Long term (current) use of aspirin: Secondary | ICD-10-CM

## 2023-01-23 DIAGNOSIS — Z7984 Long term (current) use of oral hypoglycemic drugs: Secondary | ICD-10-CM

## 2023-01-23 DIAGNOSIS — Z95820 Peripheral vascular angioplasty status with implants and grafts: Secondary | ICD-10-CM

## 2023-01-23 DIAGNOSIS — I10 Essential (primary) hypertension: Secondary | ICD-10-CM

## 2023-01-23 DIAGNOSIS — Z7902 Long term (current) use of antithrombotics/antiplatelets: Secondary | ICD-10-CM

## 2023-01-23 DIAGNOSIS — E119 Type 2 diabetes mellitus without complications: Secondary | ICD-10-CM

## 2023-01-23 DIAGNOSIS — I2510 Atherosclerotic heart disease of native coronary artery without angina pectoris: Secondary | ICD-10-CM

## 2023-01-23 DIAGNOSIS — E785 Hyperlipidemia, unspecified: Secondary | ICD-10-CM

## 2023-01-23 DIAGNOSIS — Z79899 Other long term (current) drug therapy: Secondary | ICD-10-CM

## 2023-01-23 DIAGNOSIS — I779 Disorder of arteries and arterioles, unspecified: Secondary | ICD-10-CM

## 2023-01-23 DIAGNOSIS — Z955 Presence of coronary angioplasty implant and graft: Secondary | ICD-10-CM

## 2023-01-23 NOTE — Nursing Note (Signed)
Pt did not have a med list with them at visit - went over list with patient   Advised patient to check AVS list with medicines at home and call if any discrepancies    -Confirmed correct pharmacy with patient for e-scribing   Excell Seltzer, [place] 01/23/2023 11:29

## 2023-01-23 NOTE — Progress Notes (Signed)
[facility]  [address]  [place] [address]  Phone: [phone]  Fax: [phone]    Encounter Date: 01/23/2023    Patient ID:  [name] [name]  [id number]    DOB:   Age: 82 y.o. female    Subjective:     Chief Complaint   Patient presents with    Follow Up    No Complaints     HPI  [name] [name] is a pleasant 82 year old lady with known CAD s/p PCI to the LCx x2 and more recently IVUS guided PCI on 01/19/2022 to the mid LCx x1 that was overlapped proximally with previously placed stent for unstable angina, HTN, HLD and DMT2. A TTE showed preserved EF at 68.5% and no significant valve disease. At the time of her last stent, her antianginal therapy was titrated with Lopressor increased to 75 mg twice daily and amlodipine increased to 74m daily. She was unable to tolerate the increased dose of Lopressor, so she is now taking 50 mg twice daily. She also remains on Imdur 1274monce daily and Ranexa 500 mg twice daily. Her symptoms have improved significanly compared to what she was having prior to intervention. She also reports having more energy. She used to follow with Dr. MoPasty Spillersor lipid management and remains on Repatha 140 mg every two weeks and Zetia 10 mg once daily. She denies bleeding complications with ASA and Plavix. In April of last year, she had syncope while at church. 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 She's following with vascular now and saw Dr. MiTammi Klippeln 07/2022. 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 A 14 day Holter monitor in 05/2022 showed sinus rhythm with no Afib, VT, heart block or  pauses. Average heart rate was 65bpm. A loop recorder was placed in August of 2023.     Current Outpatient Medications   Medication Sig    ALPRAZolam (XANAX) 0.5 mg Oral Tablet Take 1 Tablet (0.5 mg total) by mouth Every evening    amLODIPine (NORVASC) 5 mg Oral Tablet Take 1 Tablet (5 mg total) by mouth Once a day    bempedoic acid 180 mg Oral Tablet Take 1 Tablet (180 mg total) by mouth Once a day    Cholecalciferol, Vitamin D3, 10 mcg (400 unit) Oral Tablet, Chewable Chew Once a day    clopidogreL (PLAVIX) 75 mg Oral Tablet Take 1 Tablet (75 mg total) by mouth Once a day    empagliflozin (JARDIANCE) 10 mg Oral Tablet Take 1 Tablet (10 mg total) by mouth Once a day    evolocumab (REPATHA SURECLICK) [id number]/mL Subcutaneous Pen Injector Inject 1 pen (140 mg) subcutaneously (under the skin) once every 14 days    ezetimibe (ZETIA) 10 mg Oral Tablet TAKE 1 TABLET (10 MG TOTAL) BY MOUTH EVERY EVENING    fluticasone propionate (FLONASE NASL) by Nasal route    Irbesartan-Hydrochlorothiazide 300-12.5 mg Oral Tablet Take 300 mg by mouth Once a day    Isosorbide Mononitrate (IMDUR) 120 mg Oral Tablet Sustained Release 24 hr TAKE 1 TABLET BY MOUTH EVERY MORNING.    levothyroxine (SYNTHROID) 112  mcg Oral Tablet Take 1 Tablet (112 mcg total) by mouth Every morning    metoprolol tartrate (LOPRESSOR) 50 mg Oral Tablet Take 1 Tablet (50 mg total) by mouth Twice daily 102m in the AM and 571min PM    nitroGLYCERIN (NITROSTAT) 0.4 mg Sublingual Tablet, Sublingual PLACE 1 TABLET UNDER TONGUE EVERY 5 MINS, UP TO 3 DOSES AS NEEDED FOR CHEST PAIN    pantoprazole (PROTONIX) 40 mg Oral Tablet, Delayed Release (E.C.) Take 1 Tablet (40 mg total) by mouth Once a day    POTASSIUM-99 ORAL Take by mouth Once a day    ranolazine (RANEXA) 500 mg Oral Tablet Sustained Release 12 hr TAKE 1 TABLET (500 MG TOTAL) BY MOUTH TWICE DAILY FOR 90 DAYS    valACYclovir (VALTREX) 500 mg Oral Tablet Take 1 Tablet (500 mg total) by mouth Twice daily    vitamin B  complex (B COMPLEX-VITAMIN B12 ORAL) Take by mouth Once a day     Allergies   Allergen Reactions    Pneumococcal 23-Valent Polysaccharide Vaccine  Other Adverse Reaction (Add comment)     Severe local swelling    Tramadol Nausea/ Vomiting     "makes me sick with or without food"    Iohexol  Other Adverse Reaction (Add comment) and Hives/ Urticaria      Desc: HIVES SEVERAL HRS S/P IV CONTRAST.. [place] [facility]    Lisinopril  Other Adverse Reaction (Add comment)     Other reaction(s): Cough    Other Rash     Other reaction(s): Other (See Comments)  Nickle   When teeth were wired in she place, patient obtained an infection and wiring had to be removed,  Also allergic to silver    Crestor [Rosuvastatin]      Muscle aches    Lovastatin     Statin [Atorvastatin]      Muscle ache    Iv Contrast Hives/ Urticaria    Nickel Hives/ Urticaria     Past Medical History:   Diagnosis Date    Coronary artery disease     [place]re[place]    Dyspnea on exertion     Essential hypertension     Hyperlipidemia     PONV (postoperative nausea and vomiting)     Stented coronary artery     Type 2 diabetes mellitus (CMS HCC)          Past Surgical History:   Procedure Laterality Date    CARDIAC CATHETERIZATION      CATARACT EXTRACTION, BILATERAL Bilateral     HX COLONOSCOPY      HX HEART CATHETERIZATION  2017 and 2018    Stented twice  prox circ  and circ    HX PARTIAL HYSTERECTOMY      tubes and ovaries left intact    HX THYROIDECTOMY  1978    HX WISDOM TEETH EXTRACTION      KNEE SURGERY  2018    muscle removed    LAPAROSCOPIC CHOLECYSTECTOMY      RECONSTRUCTION OF NOSE  1980         Family Medical History:       Problem Relation (Age of Onset)    Cardiomyopathy Father    Heart Attack Mother    Heart Disease Sister    Uterine Cancer Mother, Niece            Social History     Tobacco Use  Smoking status: Former     Current packs/day: 0.50     Average packs/day: 0.5 packs/day for  10.0 years (5.0 ttl pk-yrs)     Types: Cigarettes    Smokeless tobacco: Never   Vaping Use    Vaping status: Never Used   Substance Use Topics    Alcohol use: Not Currently     Alcohol/week: 1.0 standard drink of alcohol     Types: 1 Cans of beer per week     Comment: drank long time ago not current.    Drug use: Never       Review of Systems   All other systems reviewed and are negative.    Objective:   Vitals: BP (!) 156/60   Pulse 69   Ht 1.588 m (5' 2.5")   Wt 72.1 kg (159 lb)   SpO2 97%   BMI 28.62 kg/m         Physical Exam  Vitals and nursing note reviewed.   Constitutional:       Appearance: She is well-developed.   Neck:      Vascular: No JVD.   Cardiovascular:      Rate and Rhythm: Normal rate and regular rhythm.      Pulses: Normal pulses.      Heart sounds: S1 normal and S2 normal. No murmur heard.     No friction rub. No gallop.   Pulmonary:      Effort: Pulmonary effort is normal. No respiratory distress.      Breath sounds: Normal breath sounds. No wheezing or rales.   Chest:      Chest wall: No tenderness.   Abdominal:      General: Bowel sounds are normal.      Palpations: Abdomen is soft.      Tenderness: There is no abdominal tenderness.   Musculoskeletal:         General: No tenderness. Normal range of motion.      Cervical back: Normal range of motion and neck supple.   Neurological:      Mental Status: She is alert and oriented to person, place, and time.   Psychiatric:         Behavior: Behavior normal.       Last CBC  (Last result in the past 2 years)        WBC   HGB   HCT   MCV   Platelets      03/28/22 0559 18.7   12.9   38.4   89.1   388            Last BMP  (Last result in the past 2 years)        Na   K   Cl   CO2   BUN   Cr   Calcium   Glucose   Glucose-Fasting        03/28/22 0559 135   4.7   101   24   24   1.01   10.1   154               Last Hepatic Panel  (Last result in the past 2 years)        Albumin   Total PTN   Total Bili   Direct Bili   Ast/SGOT   Alt/SGPT   Alk Phos         03/26/22 1051 3.5   7.1   0.4  Comment: Naproxen therapy can falsely  elevate total bilirubin levels.   0.2   13   13   $ 64             Last Lipid Panel  (Last result in the past 2 years)        Cholesterol   HDL   LDL   Direct LDL   Triglycerides      01/19/22 0447 171   51   94  Comment: <100 mg/dL, Optimal  100-129 mg/dL, Near/Above Optimal  130-159 mg/dL, Borderline High  160-189 mg/dL, High  >=190 mg/dL, Very high     147            Lab Results   Component Value Date    HA1C 6.1 (H) 01/17/2022     Lab Results   Component Value Date    TSH 0.155 (L) 03/27/2022    FREET4 1.21 03/27/2022     DIAGNOSTIC STUDIES REVIEWED:  Carotid duplex 03/2022: per vascular interpretation: right 50-69% stenosis, left <50% stenosis  CTA 03/2022: per vascular interpretation - <50% stenosis bilaterally  MRI brain 2012: acute very small posterior fronto parietal stroke  (Moderate on right and mild on left)   She was scheduled for a repeat carotid duplex in 1 year.     Lipid panel 05/2022- TC 166, TRIG 201, HDL 47 and LDL 85.    Assessment & Plan:     ENCOUNTER DIAGNOSES     ICD-10-CM   1. CAD in native artery  I25.10   2. S/P angioplasty with stent  Z95.820   3. Essential hypertension  I10   4. HLD (hyperlipidemia)  E78.5   5. Type 2 diabetes mellitus (CMS HCC)  E11.9     In summary, [name] [name] is a pleasant 82 year old lady with known CAD s/p PCI to the LCx x2 and PCI on 01/19/2022 to the mid LCx x1 that was overlapped proximally with previously placed stent for unstable angina, HTN, HLD and DMT2. She reports doing well today and has no complaints. She remains on GDMT with DAPT (ASA/Plavix), Zetia 10 mg once daily, Repatha 140 mg every two weeks, Lopressor 50 mg twice daily, Norvasc 40m once daily, Imdur 1246mdaily, Ranexa 500 mg twice daily, Irbesartan/HCTZ 300-12.33m44maily, and Jardiance 10 mg daily. Due to more than 1 year following intervention, will d/c ASA and resume Plavix 75 mg daily. She's scheduled for follow up of her  carotid artery disease with Dr. MinTammi Klippelhe has an ILR 2/2 syncope with no significant arrhythmias thus far and has remote monitoring. Will continue other medicines as prescribed. Will request a copy of labs from PCP.    No orders of the defined types were placed in this encounter.    Patient was seen independently with supervising physician not present but available.    Return in about 6 months (around 07/24/2023), or if symptoms worsen or fail to improve.    AshSamuella BruinA-C  01/23/2023, 11:51  [facility], Section of Cardiology

## 2023-01-28 ENCOUNTER — Other Ambulatory Visit: Payer: Self-pay

## 2023-01-29 ENCOUNTER — Other Ambulatory Visit: Payer: Self-pay

## 2023-01-30 ENCOUNTER — Ambulatory Visit (HOSPITAL_BASED_OUTPATIENT_CLINIC_OR_DEPARTMENT_OTHER): Payer: Self-pay

## 2023-01-30 ENCOUNTER — Other Ambulatory Visit: Payer: Self-pay

## 2023-01-31 ENCOUNTER — Other Ambulatory Visit: Payer: Self-pay

## 2023-02-01 ENCOUNTER — Other Ambulatory Visit: Payer: Self-pay

## 2023-02-05 ENCOUNTER — Other Ambulatory Visit: Payer: Self-pay

## 2023-02-13 ENCOUNTER — Other Ambulatory Visit (HOSPITAL_BASED_OUTPATIENT_CLINIC_OR_DEPARTMENT_OTHER): Payer: Self-pay | Admitting: Medical

## 2023-02-13 DIAGNOSIS — M25561 Pain in right knee: Secondary | ICD-10-CM

## 2023-02-16 ENCOUNTER — Other Ambulatory Visit (INDEPENDENT_AMBULATORY_CARE_PROVIDER_SITE_OTHER): Payer: Self-pay | Admitting: INTERNAL MEDICINE CARDIOVASCULAR DISEASE

## 2023-02-21 ENCOUNTER — Encounter (INDEPENDENT_AMBULATORY_CARE_PROVIDER_SITE_OTHER): Payer: Medicare PPO | Admitting: INTERNAL MEDICINE CARDIOVASCULAR DISEASE

## 2023-02-21 ENCOUNTER — Other Ambulatory Visit: Payer: Self-pay

## 2023-02-21 ENCOUNTER — Ambulatory Visit: Payer: Medicare PPO | Attending: Medical | Admitting: Medical

## 2023-02-21 ENCOUNTER — Inpatient Hospital Stay (HOSPITAL_BASED_OUTPATIENT_CLINIC_OR_DEPARTMENT_OTHER)
Admission: RE | Admit: 2023-02-21 | Discharge: 2023-02-21 | Disposition: A | Discharge status: Home / Self Care | Payer: Medicare PPO | Source: Ambulatory Visit | Admitting: Radiology

## 2023-02-21 VITALS — Ht 63.0 in | Wt 162.3 lb

## 2023-02-21 DIAGNOSIS — Z95818 Presence of other cardiac implants and grafts: Secondary | ICD-10-CM

## 2023-02-21 DIAGNOSIS — M1712 Unilateral primary osteoarthritis, left knee: Secondary | ICD-10-CM | POA: Insufficient documentation

## 2023-02-21 DIAGNOSIS — M25561 Pain in right knee: Secondary | ICD-10-CM

## 2023-02-21 DIAGNOSIS — G8929 Other chronic pain: Secondary | ICD-10-CM | POA: Insufficient documentation

## 2023-02-21 DIAGNOSIS — M170 Bilateral primary osteoarthritis of knee: Secondary | ICD-10-CM

## 2023-02-21 DIAGNOSIS — M1711 Unilateral primary osteoarthritis, right knee: Secondary | ICD-10-CM | POA: Insufficient documentation

## 2023-02-21 DIAGNOSIS — R55 Syncope and collapse: Secondary | ICD-10-CM

## 2023-02-21 NOTE — H&P (Signed)
[facility]  [place], [place] [address]  [phone]       OFFICE VISIT    PATIENT NAME:  [name] [name]  MEDICAL RECORD NUMBER: [id number]    DICTATING PHYSICIAN:  Shawnie Pons, PA-C  REFERRING PHYSICIAN:  Harriette Bouillon, PA-C    DOB:    DOS:  02/21/2023    CHIEF COMPLAINT:  right Knee Pain      HISTORY OF PRESENT ILLNESS:  [name] [name] is a 82 y.o. female.  Comes to the office today for initial consultation in regards to ongoing right knee pain.  Patient states that this has been ongoing for many years.  she cannot recall one specific injury that caused the pain.  Patient tells me that their symptoms are worse with weight bearing activities and ambulating on uneven ground and downward grades.  she has pain that awakens them from sleeping. In addition to modifying the activities that create pain, she has attempted NSAIDs, tylenol, steroid injections, Visco injections, PRP injections.  She has multiple health issues including cardiac problems, h/o stroke.  At this point the patient is here today to discuss further evaluation of their knee pain.     PAST MEDICAL HISTORY:  Past Medical History:   Diagnosis Date    Coronary artery disease     CVA (cerebral vascular accident) (CMS [place]) 2009    Dyspnea on exertion     Essential hypertension     Hyperlipidemia     PONV (postoperative nausea and vomiting)     Stented coronary artery     Type 2 diabetes mellitus (CMS HCC)            PAST SURGICAL HISTORY:  Past Surgical History:   Procedure Laterality Date    CARDIAC CATHETERIZATION      CATARACT EXTRACTION, BILATERAL Bilateral     HX COLONOSCOPY      HX HEART CATHETERIZATION  2017 and 2018    Stented twice  prox circ  and circ    HX PARTIAL HYSTERECTOMY      tubes and ovaries left intact    HX THYROIDECTOMY  1978    HX WISDOM TEETH EXTRACTION      KNEE SURGERY  2018    muscle removed    LAPAROSCOPIC CHOLECYSTECTOMY      RECONSTRUCTION OF NOSE  1980           MEDICATIONS:  Current  Outpatient Medications   Medication Sig    ALPRAZolam (XANAX) 0.5 mg Oral Tablet Take 1 Tablet (0.5 mg total) by mouth Every evening    amLODIPine (NORVASC) 5 mg Oral Tablet Take 1 Tablet (5 mg total) by mouth Once a day    bempedoic acid 180 mg Oral Tablet Take 1 Tablet (180 mg total) by mouth Once a day    Cholecalciferol, Vitamin D3, 10 mcg (400 unit) Oral Tablet, Chewable Chew Once a day    clopidogreL (PLAVIX) 75 mg Oral Tablet Take 1 Tablet (75 mg total) by mouth Once a day    empagliflozin (JARDIANCE) 10 mg Oral Tablet Take 1 Tablet (10 mg total) by mouth Once a day    evolocumab (REPATHA SURECLICK) [id number] mg/mL Subcutaneous Pen Injector Inject 1 pen (140 mg) subcutaneously (under the skin) once every 14 days    ezetimibe (ZETIA) 10 mg Oral Tablet TAKE 1 TABLET (10 MG TOTAL) BY MOUTH EVERY EVENING    fluticasone propionate (FLONASE NASL) by Nasal route    Irbesartan-Hydrochlorothiazide 300-12.5 mg Oral  Tablet Take 300 mg by mouth Once a day    Isosorbide Mononitrate (IMDUR) 120 mg Oral Tablet Sustained Release 24 hr TAKE 1 TABLET BY MOUTH EVERY MORNING.    levothyroxine (SYNTHROID) 112 mcg Oral Tablet Take 1 Tablet (112 mcg total) by mouth Every morning    metoprolol tartrate (LOPRESSOR) 50 mg Oral Tablet Take 1 Tablet (50 mg total) by mouth Twice daily 50mg  in the AM and 50mg  in PM    nitroGLYCERIN (NITROSTAT) 0.4 mg Sublingual Tablet, Sublingual PLACE 1 TABLET UNDER TONGUE EVERY 5 MINS, UP TO 3 DOSES AS NEEDED FOR CHEST PAIN    pantoprazole (PROTONIX) 40 mg Oral Tablet, Delayed Release (E.C.) Take 1 Tablet (40 mg total) by mouth Once a day    POTASSIUM-99 ORAL Take by mouth Once a day    ranolazine (RANEXA) 500 mg Oral Tablet Sustained Release 12 hr TAKE 1 TABLET (500 MG TOTAL) BY MOUTH TWICE DAILY FOR 90 DAYS    valACYclovir (VALTREX) 500 mg Oral Tablet Take 1 Tablet (500 mg total) by mouth Twice daily    vitamin B complex (B COMPLEX-VITAMIN B12 ORAL) Take by mouth Once a day       ALLERGIES:  Allergy History  as of 02/21/23       IV CONTRAST         Noted Status Severity Type Reaction    08/28/19 1339 [place], [place], LPN [id number] Active Low  Hives/ Urticaria              NICKEL         Noted Status Severity Type Reaction    08/28/19 1339 [place], [place], LPN [id number] Active Low  Hives/ Urticaria              ATORVASTATIN         Noted Status Severity Type Reaction    05/24/21 1302 [place], Amy, RN 05/24/21 Active       Comments: Muscle ache               ROSUVASTATIN         Noted Status Severity Type Reaction    05/24/21 1303 Calain, Amy, RN 05/24/21 Active       Comments: Muscle aches               LOVASTATIN         Noted Status Severity Type Reaction    05/24/21 1303 Hoy Finlay, RN 05/24/21 Active                 IOHEXOL         Noted Status Severity Type Reaction    01/18/22 0401 Brayton El, RN 08/15/05 Active Medium Side Effect  Other Adverse Reaction (Add comment), Hives/ Urticaria    Comments:  Desc: HIVES SEVERAL HRS S/P IV CONTRAST.. [place] LAST SCAN W/ BENADRYL PREMED @ [place] '01/AC.     07/26/21 1424 Flesher, Fanny Bien, LPN [id number] Active Medium  Hives/ Urticaria,  Other Adverse Reaction (Add comment)    Comments:  Desc: HIVES SEVERAL HRS S/P IV CONTRAST.. [place] LAST SCAN W/ BENADRYL PREMED @ [facility] '01/AC.               LISINOPRIL         Noted Status Severity Type Reaction    01/18/22 0402 Brayton El, RN 05/25/14 Active Medium Side Effect  Other Adverse Reaction (Add comment)    Comments: Other reaction(s): Cough     07/26/21 1424 Rica Koyanagi, LPN [id number]  Active Medium      Comments: Other reaction(s): Cough               PNEUMOCOCCAL 23-VALENT POLYSACCHARIDE VACCINE         Noted Status Severity Type Reaction    01/18/22 0401 Brayton El, RN 05/25/14 Active High Systemic  Other Adverse Reaction (Add comment)    Comments: Severe local swelling     07/26/21 1424 Rica Koyanagi, LPN [id number] Active High   Other Adverse Reaction (Add comment)    Comments: Severe local swelling               TRAMADOL          Noted Status Severity Type Reaction    01/18/22 0401 Brayton El, RN 10/21/18 Active High Side Effect Nausea/ Vomiting    Comments: "makes me sick with or without food"     07/26/21 1424 Rica Koyanagi, LPN [id number] Active High  Nausea/ Vomiting    Comments: "makes me sick with or without food"               OTHER         Noted Status Severity Type Reaction    01/18/22 0401 Brayton El, RN 01/12/16 Active Medium Side Effect Rash    Comments: Other reaction(s): Other (See Comments)  Nickle   When teeth were wired in she place, patient obtained an infection and wiring had to be removed,  Also allergic to silver     07/26/21 1425 Rica Koyanagi, LPN [id number] Active Medium  Rash    Comments: Other reaction(s): Other (See Comments)  Nickle   When teeth were wired in she place, patient obtained an infection and wiring had to be removed,  Also allergic to silver                     FAMILY HISTORY:  Family Medical History:       Problem Relation (Age of Onset)    Cardiomyopathy Father    Heart Attack Mother    Heart Disease Sister    Uterine Cancer Mother, Niece                SOCIAL HISTORY:  Social History     Socioeconomic History    Marital status: Married     Spouse name: Not on file    Number of children: Not on file    Years of education: Not on file    Highest education level: Not on file   Occupational History    Not on file   Tobacco Use    Smoking status: Former     Current packs/day: 0.50     Average packs/day: 0.5 packs/day for 10.0 years (5.0 ttl pk-yrs)     Types: Cigarettes    Smokeless tobacco: Never   Vaping Use    Vaping status: Never Used   Substance and Sexual Activity    Alcohol use: Not Currently     Alcohol/week: 1.0 standard drink of alcohol     Types: 1 Cans of beer per week     Comment: drank long time ago not current.    Drug use: Never    Sexual activity: Not on file   Other Topics Concern    Ability to Walk 1 Flight of Steps without SOB/CP No    Routine Exercise Not Asked    Ability  to Walk 2 Flight of Steps without SOB/CP Not Asked    Unable to  Ambulate Not Asked    Total Care Not Asked    Ability To Do Own ADL's Yes    Uses Walker Not Asked    Other Activity Level Not Asked    Uses Cane Not Asked   Social History Narrative    Not on file     Social Determinants of Health     Financial Resource Strain: Not on file   Transportation Needs: Not on file   Social Connections: Not on file   Intimate Partner Violence: Not on file   Housing Stability: Not on file       REVIEW OF SYSTEMS  Constitutional: negative  Musculoskeletal:positive for Right knee pain  All other ROS Negative    PHYSICAL EXAMINATION:    Ht 1.6 m (5\' 3" )   Wt 73.6 kg (162 lb 4.1 oz)   BMI 28.74 kg/m         GENERAL: she is alert, cooperative, no distress, appears stated age.      ORTHOPEDIC EVALUATION:    Patient displays an abnormal gait.  Patient favors the rightlower extremity.  Gross inspection reveals normal mechanical and anatomic axis of the lower extremities with no appreciable leg length discrepancy.  No tenderness found with AP or lateral compression testing of the pelvis.    Right hip shows full PROM (Forward flexion 0-115 degrees, internal rotation 0-35 degrees, external rotation 0-45 degrees). McCarthy's Maneuvers are negative for impingement. FABRE testing negative. No tenderness to palpation over the greater trochanter.  Straight leg testing is negative for radiculopathy.  Compartments are supple.  Skin is clear.  +5/5 hip flexors/extenders, as well as, knee flexors and extenders.  Intact sensory exam along all dermatomes.    Left hip shows full PROM (Forward flexion 0-115 degrees, internal rotation 0-35 degrees, external rotation 0-45 degrees). McCarthy's Maneuvers are negative for impingement. FABRE testing negative. No tenderness to palpation over the greater trochanter.  Straight leg testing is negative for radiculopathy.  Compartments are supple.  Skin is clear.  +5/5 hip flexors/extenders, as well as, knee  flexors and extenders.  Intact sensory exam along all dermatomes.    Right knee shows full PROM (0-120 degrees).  Patella tracks midline without instability.  Varus deformity.  Moderate tenderness to palpation of the distal femur and proximal tibia. Clark's patella-femoral test is positivefor crepitus and pain.  No effusion is present.  Lachman's test is negative.  Posterior Drawer test is negative.  Collateral ligamental testing is stable at 30 degrees of extension, mid-flexion, and terminal flexion.  McMurrary testing is negative.  No Baker's cyst is present.  Skin is clear.  Normoreflexic.   Left knee shows full PROM (0-120 degrees).  Patella tracks midline without instability.  Varus deformity.  Mild tenderness to palpation of the distal femur and proximal tibia. Clark's patella-femoral test is positivefor crepitus and pain.  No effusion is present.  Lachman's test is negative.  Posterior Drawer test is negative.  Collateral ligamental testing is stable at 30 degrees of extension, mid-flexion, and terminal flexion.  McMurrary testing is negative.  No Baker's cyst is present.  Skin is clear.  Normoreflexic.    Right Ankle shows full PROM in plantar and dorsiflexion.  Stable to ligamental testing.  Skin is clear. +5/5 motor strength to plantar/dorsiflexion, inversion/eversion, and EHL.  Intact sensory to light-touch.  +2/4 dorsalis pedis and posterior tibial pulse.    Left Ankle shows full PROM in plantar and dorsiflexion.  Stable to ligamental testing.  Skin is clear. +5/5  motor strength to plantar/dorsiflexion, inversion/eversion, and EHL.  Intact sensory to light-touch.  +2/4 dorsalis pedis and posterior tibial pulse.          IMAGING:      TECHNIQUE: 4 views / 4 images submitted.     COMPARISON: No prior studies were compared.        FINDINGS: Advanced degenerative process of the right knee is noted with joint space narrowing subchondral sclerosis chondrocalcinosis and osteophytosis. Narrowing is most  significant in the medial compartment. No acute fracture or dislocation. No large effusion. Degenerative changes of the left knee are also noted.     IMPRESSION:  Advanced degenerative arthrosis of the right knee.      IMPRESSION:      ICD-10-CM    1. Chronic pain of right knee  M25.561     G89.29       2. Primary osteoarthritis of right knee  M17.11 Zilretta Joint Injection Schedule/Auth      3. Primary osteoarthritis of left knee  M17.12           PLAN:        At this point I have discussed with the patient today my evaluation and recommended treatment alternatives.  We have discussed the pros and cons of both conservative, as well as, surgical intervention.After discussion we are going to continue with conservative management. I have reviewed alternative to exercise including aquatic therapy.  She is a high risk for surgical complications with her overall health issues.  She would like to continue with non-operative management.  We discussed pharmacologic treatment with tylenol.  In addition, injection management will be initiated with Zilretta.  All questions were answered to their completeness.  We will continue to monitor their symptoms as an outpatient.         This note was partially generated using MModal Fluency Direct system, and there may be some incorrect words, spellings, and punctuation that were not noted in checking the note before saving.    Shawnie Pons, PA-C   [facility]

## 2023-03-08 ENCOUNTER — Other Ambulatory Visit: Payer: Self-pay

## 2023-03-18 ENCOUNTER — Other Ambulatory Visit (INDEPENDENT_AMBULATORY_CARE_PROVIDER_SITE_OTHER): Payer: Self-pay | Admitting: NURSE PRACTITIONER, FAMILY

## 2023-03-18 DIAGNOSIS — I10 Essential (primary) hypertension: Secondary | ICD-10-CM

## 2023-03-24 ENCOUNTER — Encounter (INDEPENDENT_AMBULATORY_CARE_PROVIDER_SITE_OTHER): Payer: Medicare PPO | Admitting: INTERNAL MEDICINE CARDIOVASCULAR DISEASE

## 2023-03-24 DIAGNOSIS — Z95818 Presence of other cardiac implants and grafts: Secondary | ICD-10-CM

## 2023-03-24 DIAGNOSIS — R55 Syncope and collapse: Secondary | ICD-10-CM

## 2023-03-25 ENCOUNTER — Other Ambulatory Visit: Payer: Self-pay

## 2023-03-25 ENCOUNTER — Encounter (HOSPITAL_BASED_OUTPATIENT_CLINIC_OR_DEPARTMENT_OTHER): Payer: Self-pay | Admitting: Medical

## 2023-03-25 ENCOUNTER — Ambulatory Visit: Payer: Medicare PPO | Attending: Medical | Admitting: Medical

## 2023-03-25 DIAGNOSIS — M1711 Unilateral primary osteoarthritis, right knee: Secondary | ICD-10-CM | POA: Insufficient documentation

## 2023-03-25 MED ORDER — BUPIVACAINE HCL 0.5 % (5 MG/ML) INJECTION SOLUTION
3.0000 mL | Freq: Once | INTRAMUSCULAR | Status: AC
Start: 2023-03-25 — End: 2023-03-25
  Administered 2023-03-25: 3 mL via INTRAMUSCULAR

## 2023-03-25 MED ORDER — TRIAMCINOLONE ACETONIDE ER 32 MG INTRA-ARTICULAR SUSPENSION EXT.RELEAS
32.0000 mg | Freq: Once | INTRA_ARTICULAR | Status: AC
Start: 2023-03-25 — End: 2023-03-25
  Administered 2023-03-25: 32 mg via INTRA_ARTICULAR

## 2023-03-25 NOTE — Progress Notes (Signed)
Orthopaedics & Sports Medicine  [address]  [place], [place] [address]  [phone]      Knee Injection Visit    PATIENT NAME:  [name] [name]  MEDICAL RECORD NUMBER [id number]    DICTATING PHYSICIAN:  Launa Flight, PA-C  REFERRING PHYSICIAN:  Launa Flight, PA-C    DOB:    DOS:  03/25/2023     CC:  Right Knee Injection of Zilretta.    HISTORY OF PRESENT ILLNESS:    [name] [name] returns today for a right knee viscosupplementation injection for the management of their arthritic symptoms.   Patient states that their arthritis symptoms remain the same since their last visit.  she continues to take tylenol at home for her symptoms.  Patient currently denies any fevers or chills.  No reports of any illnesses.      Allergies   Allergen Reactions    Pneumococcal 23-Valent Polysaccharide Vaccine  Other Adverse Reaction (Add comment)     Severe local swelling    Tramadol Nausea/ Vomiting     "makes me sick with or without food"    Iohexol  Other Adverse Reaction (Add comment) and Hives/ Urticaria      Desc: HIVES SEVERAL HRS S/P IV CONTRAST.. [place] LAST SCAN W/ BENADRYL PREMED @ [facility] '01/AC.    Lisinopril  Other Adverse Reaction (Add comment)     Other reaction(s): Cough    Other Rash     Other reaction(s): Other (See Comments)  Nickle   When teeth were wired in she place, patient obtained an infection and wiring had to be removed,  Also allergic to silver    Crestor [Rosuvastatin]      Muscle aches    Lovastatin     Statin [Atorvastatin]      Muscle ache    Iv Contrast Hives/ Urticaria    Nickel Hives/ Urticaria        Past Medical History:   Diagnosis Date    Coronary artery disease     CVA (cerebral vascular accident) (CMS HCC) 2009    Dyspnea on exertion     Essential hypertension     Hyperlipidemia     PONV (postoperative nausea and vomiting)     Stented coronary artery     Type 2 diabetes mellitus (CMS HCC)             Past Surgical History:   Procedure Laterality Date    CARDIAC CATHETERIZATION      CATARACT  EXTRACTION, BILATERAL Bilateral     HX COLONOSCOPY      HX HEART CATHETERIZATION  2017 and 2018    Stented twice  prox circ  and circ    HX PARTIAL HYSTERECTOMY      tubes and ovaries left intact    HX THYROIDECTOMY  1978    HX WISDOM TEETH EXTRACTION      KNEE SURGERY  2018    muscle removed    LAPAROSCOPIC CHOLECYSTECTOMY      RECONSTRUCTION OF NOSE  1980            Current Outpatient Medications   Medication Sig    ALPRAZolam (XANAX) 0.5 mg Oral Tablet Take 1 Tablet (0.5 mg total) by mouth Every evening    amLODIPine (NORVASC) 5 mg Oral Tablet Take 1 Tablet (5 mg total) by mouth Once a day    bempedoic acid 180 mg Oral Tablet Take 1 Tablet (180 mg total) by mouth Once a day    Cholecalciferol, Vitamin D3,  10 mcg (400 unit) Oral Tablet, Chewable Chew Once a day    clopidogreL (PLAVIX) 75 mg Oral Tablet Take 1 Tablet (75 mg total) by mouth Once a day    empagliflozin (JARDIANCE) 10 mg Oral Tablet Take 1 Tablet (10 mg total) by mouth Once a day    evolocumab (REPATHA SURECLICK) 140 mg/mL Subcutaneous Pen Injector Inject 1 pen (140 mg) subcutaneously (under the skin) once every 14 days    ezetimibe (ZETIA) 10 mg Oral Tablet TAKE 1 TABLET (10 MG TOTAL) BY MOUTH EVERY EVENING    fluticasone propionate (FLONASE NASL) by Nasal route    Irbesartan-Hydrochlorothiazide 300-12.5 mg Oral Tablet Take 300 mg by mouth Once a day    Isosorbide Mononitrate (IMDUR) 120 mg Oral Tablet Sustained Release 24 hr TAKE 1 TABLET BY MOUTH EVERY MORNING.    levothyroxine (SYNTHROID) 112 mcg Oral Tablet Take 1 Tablet (112 mcg total) by mouth Every morning    metoprolol tartrate (LOPRESSOR) 50 mg Oral Tablet TAKE 1 TABLET (50 MG TOTAL) BY MOUTH TWICE DAILY  IN THE AM AND  IN PM    nitroGLYCERIN (NITROSTAT) 0.4 mg Sublingual Tablet, Sublingual PLACE 1 TABLET UNDER TONGUE EVERY 5 MINS, UP TO 3 DOSES AS NEEDED FOR CHEST PAIN    pantoprazole (PROTONIX) 40 mg Oral Tablet, Delayed Release (E.C.) Take 1 Tablet (40 mg total) by mouth Once a  day    POTASSIUM-99 ORAL Take by mouth Once a day    ranolazine (RANEXA) 500 mg Oral Tablet Sustained Release 12 hr TAKE 1 TABLET (500 MG TOTAL) BY MOUTH TWICE DAILY FOR 90 DAYS    valACYclovir (VALTREX) 500 mg Oral Tablet Take 1 Tablet (500 mg total) by mouth Twice daily    vitamin B complex (B COMPLEX-VITAMIN B12 ORAL) Take by mouth Once a day        PHYSICAL EXAMINATION:    Ht 1.6 m ( )   Wt 73.6 kg (162 lb 4.1 oz)   BMI 28.74 kg/m         Orthopedic Evaluation:    Patient displays an antalgic gait favoring the right side.     Right knee:  Crepitus and pain during ROM testing.   Clarke's patellofemoral compression test positive for crepitus and pain.  Trace effusion. Ligamental testing stable.  Previous injection sites clear.   Neurovascular:  Patient displays symmetric muscle strength.  No obvious gross abnormalities.  5/5 motor strength.  Intact sensory evaluation.  +2/4 dorsalis pedis pulse.  Skin clear.     ASSESSMENT:      ICD-10-CM    1. Primary osteoarthritis of right knee  M17.11            PROCEDURE:   I reviewed with the patient today the risks, benefits, rationale, expected outcomes and potential complications of undergoing injection management.  Consent has been obtained and a signed copy has been placed on the chart.  Patient identified their right knee was to be injected.   The patient was positioned with their knees dangling from a seated position.  The anterior lateral portal of the right knee was prepped using sterile technique.  A 1 unit vial of  Zilretta was injected  into the right knee.  The patient tolerated this without complaints.      PLAN:  Patient was educated on the management of her arthritis moving forward.  she will contact our office based on her symptoms returning.  If she receives a favorable response to the injections for greater  than 3 months we could discuss retreating with injections and conservative management.   Should her  symptoms return at the same intensity prior  to starting injection treatments with in the next 3 months, she should contact our office for possible surgical planning.  she was once again reminded to take it easy the rest of the day.  Ice is an option for symptoms today.  Questions were encouraged and answered.      This note was partially generated using MModal Fluency Direct system, and there may be some incorrect words, spellings, and punctuation that were not noted in checking the note before saving.    Launa Flight, PA-C     [facility] - Orthopaedics & Sports Medicine  [facility]

## 2023-03-25 NOTE — Addendum Note (Signed)
Addended by: Wilburt Finlay on: 03/25/2023 01:51 PM     Modules accepted: Orders

## 2023-03-25 NOTE — Progress Notes (Signed)
Orthopaedics & Sports Medicine  [address]  [place], [place] [address]  [phone]      Knee Injection Visit    PATIENT NAME:  [name] [name]  MEDICAL RECORD NUMBER [id number]    DICTATING PHYSICIAN:  Launa Flight, PA-C  REFERRING PHYSICIAN:  Launa Flight, PA-C    DOB:    DOS:  03/25/2023      CC:  Right Knee Injection of Zilretta.    HISTORY OF PRESENT ILLNESS:    [name] [name] returns today for a right knee injection for the management of their arthritic symptoms.   Patient states that their arthritis symptoms remain the same since their last visit.  she continues to take Tylenol at home for her symptoms.  Patient currently denies any fevers or chills.  No reports of any illnesses.      Allergies   Allergen Reactions    Pneumococcal 23-Valent Polysaccharide Vaccine  Other Adverse Reaction (Add comment)     Severe local swelling    Tramadol Nausea/ Vomiting     "makes me sick with or without food"    Iohexol  Other Adverse Reaction (Add comment) and Hives/ Urticaria      Desc: HIVES SEVERAL HRS S/P IV CONTRAST.. [place] LAST SCAN W/ BENADRYL PREMED @ [facility] '01/AC.    Lisinopril  Other Adverse Reaction (Add comment)     Other reaction(s): Cough    Other Rash     Other reaction(s): Other (See Comments)  Nickle   When teeth were wired in she place, patient obtained an infection and wiring had to be removed,  Also allergic to silver    Crestor [Rosuvastatin]      Muscle aches    Lovastatin     Statin [Atorvastatin]      Muscle ache    Iv Contrast Hives/ Urticaria    Nickel Hives/ Urticaria        Past Medical History:   Diagnosis Date    Coronary artery disease     CVA (cerebral vascular accident) (CMS HCC) 2009    Dyspnea on exertion     Essential hypertension     Hyperlipidemia     PONV (postoperative nausea and vomiting)     Stented coronary artery     Type 2 diabetes mellitus (CMS HCC)             Past Surgical History:   Procedure Laterality Date    CARDIAC CATHETERIZATION      CATARACT EXTRACTION, BILATERAL  Bilateral     HX COLONOSCOPY      HX HEART CATHETERIZATION  2017 and 2018    Stented twice  prox circ  and circ    HX PARTIAL HYSTERECTOMY      tubes and ovaries left intact    HX THYROIDECTOMY  1978    HX WISDOM TEETH EXTRACTION      KNEE SURGERY  2018    muscle removed    LAPAROSCOPIC CHOLECYSTECTOMY      RECONSTRUCTION OF NOSE  1980            Current Outpatient Medications   Medication Sig    ALPRAZolam (XANAX) 0.5 mg Oral Tablet Take 1 Tablet (0.5 mg total) by mouth Every evening    amLODIPine (NORVASC) 5 mg Oral Tablet Take 1 Tablet (5 mg total) by mouth Once a day    bempedoic acid 180 mg Oral Tablet Take 1 Tablet (180 mg total) by mouth Once a day    Cholecalciferol, Vitamin D3,  10 mcg (400 unit) Oral Tablet, Chewable Chew Once a day    clopidogreL (PLAVIX) 75 mg Oral Tablet Take 1 Tablet (75 mg total) by mouth Once a day    empagliflozin (JARDIANCE) 10 mg Oral Tablet Take 1 Tablet (10 mg total) by mouth Once a day    evolocumab (REPATHA SURECLICK) 140 mg/mL Subcutaneous Pen Injector Inject 1 pen (140 mg) subcutaneously (under the skin) once every 14 days    ezetimibe (ZETIA) 10 mg Oral Tablet TAKE 1 TABLET (10 MG TOTAL) BY MOUTH EVERY EVENING    fluticasone propionate (FLONASE NASL) by Nasal route    Irbesartan-Hydrochlorothiazide 300-12.5 mg Oral Tablet Take 300 mg by mouth Once a day    Isosorbide Mononitrate (IMDUR) 120 mg Oral Tablet Sustained Release 24 hr TAKE 1 TABLET BY MOUTH EVERY MORNING.    levothyroxine (SYNTHROID) 112 mcg Oral Tablet Take 1 Tablet (112 mcg total) by mouth Every morning    metoprolol tartrate (LOPRESSOR) 50 mg Oral Tablet TAKE 1 TABLET (50 MG TOTAL) BY MOUTH TWICE DAILY  IN THE AM AND  IN PM    nitroGLYCERIN (NITROSTAT) 0.4 mg Sublingual Tablet, Sublingual PLACE 1 TABLET UNDER TONGUE EVERY 5 MINS, UP TO 3 DOSES AS NEEDED FOR CHEST PAIN    pantoprazole (PROTONIX) 40 mg Oral Tablet, Delayed Release (E.C.) Take 1 Tablet (40 mg total) by mouth Once a day    POTASSIUM-99 ORAL  Take by mouth Once a day    ranolazine (RANEXA) 500 mg Oral Tablet Sustained Release 12 hr TAKE 1 TABLET (500 MG TOTAL) BY MOUTH TWICE DAILY FOR 90 DAYS    valACYclovir (VALTREX) 500 mg Oral Tablet Take 1 Tablet (500 mg total) by mouth Twice daily    vitamin B complex (B COMPLEX-VITAMIN B12 ORAL) Take by mouth Once a day        PHYSICAL EXAMINATION:    Ht 1.6 m ( )   Wt 73.6 kg (162 lb 4.1 oz)   BMI 28.74 kg/m         Orthopedic Evaluation:    Patient displays an antalgic gait favoring the right side.     Right knee:  Crepitus and pain during ROM testing.   Clarke's patellofemoral compression test positive for crepitus and pain.  Trace effusion. Ligamental testing stable.  Previous injection sites clear.   Neurovascular:  Patient displays symmetric muscle strength.  No obvious gross abnormalities.  5/5 motor strength.  Intact sensory evaluation.  +2/4 dorsalis pedis pulse.  Skin clear.     ASSESSMENT:      ICD-10-CM    1. Primary osteoarthritis of right knee  M17.11            PROCEDURE:   I reviewed with the patient today the risks, benefits, rationale, expected outcomes and potential complications of undergoing injection management.  Consent has been obtained and a signed copy has been placed on the chart.  Patient identified their right knee was to be injected.   The patient was positioned with their knees dangling from a seated position.  The anterior lateral portal of the right knee was prepped using sterile technique.  A 1 unit vial of Zilretta was injected  into the right knee.  The patient tolerated this without complaints.      PLAN:  Patient was educated on the management of her arthritis moving forward.  she will contact our office based on her symptoms returning.  If she receives a favorable response to the injections for greater than  3 months we could discuss retreating with injections and conservative management.   Should her  symptoms return at the same intensity prior to starting injection  treatments with in the next 3 months, she should contact our office for possible surgical planning.  she was once again reminded to take it easy the rest of the day.  Ice is an option for symptoms today.  Questions were encouraged and answered.      This note was partially generated using MModal Fluency Direct system, and there may be some incorrect words, spellings, and punctuation that were not noted in checking the note before saving.    I am scribing for, and in the presence of, Launa Flight, MMS, PA-C for services provided on 03/25/2023.  Darlen Round, MA, [facility]    [facility] - Orthopaedics & Sports Medicine  [facility]

## 2023-04-01 ENCOUNTER — Other Ambulatory Visit (INDEPENDENT_AMBULATORY_CARE_PROVIDER_SITE_OTHER): Payer: Self-pay

## 2023-04-01 DIAGNOSIS — I10 Essential (primary) hypertension: Secondary | ICD-10-CM

## 2023-04-11 ENCOUNTER — Other Ambulatory Visit (INDEPENDENT_AMBULATORY_CARE_PROVIDER_SITE_OTHER): Payer: Self-pay | Admitting: INTERNAL MEDICINE CARDIOVASCULAR DISEASE

## 2023-04-15 ENCOUNTER — Other Ambulatory Visit (INDEPENDENT_AMBULATORY_CARE_PROVIDER_SITE_OTHER): Payer: Self-pay | Admitting: Cardiovascular Disease

## 2023-04-22 ENCOUNTER — Other Ambulatory Visit: Payer: Self-pay

## 2023-04-23 ENCOUNTER — Other Ambulatory Visit: Payer: Self-pay | Admitting: Pharmacist

## 2023-04-23 ENCOUNTER — Other Ambulatory Visit: Payer: Self-pay

## 2023-04-23 NOTE — Telephone Encounter (Signed)
Specialty Pharmacy Follow-up Assessment Note    Date of assessment: 04/23/2023  Patient: [name] [name] is a 82 y.o. female  Diagnosis:  HLD w/known ASCVD  Specialty Medication:  Repatha 140 mg SC every 14 days  Prescriber: Curlene Labrum, NP  Initiation of therapy: September 2022    Subjective:  Disease state:   Considering current health conditions, patient scores overall health as 8/10 (10=worst.)  Denies missed days from planned activities due to disease.   Denies recent hospitalizations/ER/urgent care visits in the past month related to diagnosis.    Patient reported goal includes: improving cardiac health.  Side effects: none  Adherence: Denies any missed doses. Reports using  a paper calendar  as a method of adherence.  Medication list reported changes: none  She is currently injecting into her thigh every other Thursday, rotating injection sites each time.  She has a plastic can with a lid that she uses for disposal of used pens.     Objective:  Pertinent labs include:   01/18/23:  LDL calc = 92    Assessment:  Disease state: Medication appropriate with therapeutic goal of LDL <55.  Side effects: none  Adherence: Patient non-adherent according to fill records; denies missed/late doses within the last four weeks. According to our records last dose should have been 5/16 and she should be due for a refill 5/30. Per patient, last dose was given 5/9 and she has a pen on hand for dose 5/23.  Medication list: up to date; updated on 4/22, per patient no changes since that time. Continues Nexletol and Zetia.    Plan:   Disease state: Continue therapy: Reviewed adherence, administration, and sharps disposal.  Pharmacist will continue to provide periodic follow-up.  Side effects: continue to monitor  Adherence: Reminded patient of methods of adherence and scheduled medication delivery as below.   Medication list: updated list    Guy Sandifer, [facility]  04/23/2023, 16:02

## 2023-04-24 ENCOUNTER — Encounter (INDEPENDENT_AMBULATORY_CARE_PROVIDER_SITE_OTHER): Payer: Medicare PPO | Admitting: Cardiovascular Disease

## 2023-04-24 ENCOUNTER — Other Ambulatory Visit: Payer: Self-pay

## 2023-04-24 DIAGNOSIS — R55 Syncope and collapse: Secondary | ICD-10-CM

## 2023-04-24 DIAGNOSIS — Z95818 Presence of other cardiac implants and grafts: Secondary | ICD-10-CM

## 2023-04-25 ENCOUNTER — Other Ambulatory Visit: Payer: Self-pay

## 2023-04-26 ENCOUNTER — Other Ambulatory Visit: Payer: Self-pay

## 2023-04-30 ENCOUNTER — Other Ambulatory Visit: Payer: Self-pay

## 2023-05-02 ENCOUNTER — Encounter (INDEPENDENT_AMBULATORY_CARE_PROVIDER_SITE_OTHER): Payer: Self-pay | Admitting: Vascular Surgery

## 2023-05-08 ENCOUNTER — Other Ambulatory Visit: Payer: Self-pay

## 2023-05-08 ENCOUNTER — Other Ambulatory Visit (HOSPITAL_BASED_OUTPATIENT_CLINIC_OR_DEPARTMENT_OTHER): Payer: Self-pay

## 2023-05-08 ENCOUNTER — Encounter (HOSPITAL_BASED_OUTPATIENT_CLINIC_OR_DEPARTMENT_OTHER): Payer: Self-pay

## 2023-05-08 ENCOUNTER — Inpatient Hospital Stay
Admission: RE | Admit: 2023-05-08 | Discharge: 2023-05-08 | Disposition: A | Discharge status: Home / Self Care | Payer: Medicare PPO | Source: Ambulatory Visit

## 2023-05-08 DIAGNOSIS — Z1231 Encounter for screening mammogram for malignant neoplasm of breast: Secondary | ICD-10-CM | POA: Insufficient documentation

## 2023-05-08 HISTORY — DX: Malignant (primary) neoplasm, unspecified: C80.1

## 2023-05-14 DIAGNOSIS — Z1231 Encounter for screening mammogram for malignant neoplasm of breast: Secondary | ICD-10-CM

## 2023-05-17 ENCOUNTER — Other Ambulatory Visit (INDEPENDENT_AMBULATORY_CARE_PROVIDER_SITE_OTHER): Payer: Self-pay | Admitting: Cardiovascular Disease

## 2023-05-25 ENCOUNTER — Encounter (INDEPENDENT_AMBULATORY_CARE_PROVIDER_SITE_OTHER): Payer: Medicare PPO | Admitting: Cardiovascular Disease

## 2023-05-25 DIAGNOSIS — R55 Syncope and collapse: Secondary | ICD-10-CM

## 2023-05-25 DIAGNOSIS — Z95818 Presence of other cardiac implants and grafts: Secondary | ICD-10-CM

## 2023-05-30 ENCOUNTER — Other Ambulatory Visit (INDEPENDENT_AMBULATORY_CARE_PROVIDER_SITE_OTHER): Payer: Self-pay

## 2023-05-30 DIAGNOSIS — I6529 Occlusion and stenosis of unspecified carotid artery: Secondary | ICD-10-CM

## 2023-06-15 ENCOUNTER — Other Ambulatory Visit (INDEPENDENT_AMBULATORY_CARE_PROVIDER_SITE_OTHER): Payer: Self-pay | Admitting: NURSE PRACTITIONER, FAMILY

## 2023-06-17 ENCOUNTER — Other Ambulatory Visit (INDEPENDENT_AMBULATORY_CARE_PROVIDER_SITE_OTHER): Payer: Self-pay | Admitting: Medical

## 2023-06-17 ENCOUNTER — Other Ambulatory Visit: Payer: Self-pay

## 2023-06-17 MED ORDER — ISOSORBIDE MONONITRATE ER 120 MG TABLET,EXTENDED RELEASE 24 HR
120.0000 mg | ORAL_TABLET | Freq: Every morning | ORAL | 3 refills | Status: DC
Start: 2023-06-17 — End: 2023-06-17
  Filled 2023-06-17: qty 90, 90d supply, fill #0

## 2023-06-17 MED ORDER — ISOSORBIDE MONONITRATE ER 120 MG TABLET,EXTENDED RELEASE 24 HR
120.0000 mg | ORAL_TABLET | Freq: Every morning | ORAL | 3 refills | Status: DC
Start: 2023-06-17 — End: 2024-06-11

## 2023-06-19 ENCOUNTER — Other Ambulatory Visit (INDEPENDENT_AMBULATORY_CARE_PROVIDER_SITE_OTHER): Payer: Self-pay | Admitting: Cardiovascular Disease

## 2023-06-24 ENCOUNTER — Encounter (INDEPENDENT_AMBULATORY_CARE_PROVIDER_SITE_OTHER): Payer: Self-pay | Admitting: Physician Assistant

## 2023-06-24 ENCOUNTER — Ambulatory Visit (INDEPENDENT_AMBULATORY_CARE_PROVIDER_SITE_OTHER): Payer: Medicare PPO | Admitting: Medical

## 2023-06-24 ENCOUNTER — Other Ambulatory Visit: Payer: Self-pay

## 2023-06-24 VITALS — BP 138/99 | HR 63 | Ht 62.0 in | Wt 163.0 lb

## 2023-06-24 DIAGNOSIS — Z95820 Peripheral vascular angioplasty status with implants and grafts: Secondary | ICD-10-CM

## 2023-06-24 DIAGNOSIS — E785 Hyperlipidemia, unspecified: Secondary | ICD-10-CM

## 2023-06-24 DIAGNOSIS — I2510 Atherosclerotic heart disease of native coronary artery without angina pectoris: Secondary | ICD-10-CM

## 2023-06-24 DIAGNOSIS — R0602 Shortness of breath: Secondary | ICD-10-CM

## 2023-06-24 DIAGNOSIS — Z8249 Family history of ischemic heart disease and other diseases of the circulatory system: Secondary | ICD-10-CM

## 2023-06-24 DIAGNOSIS — Z789 Other specified health status: Secondary | ICD-10-CM

## 2023-06-24 DIAGNOSIS — Z87891 Personal history of nicotine dependence: Secondary | ICD-10-CM

## 2023-06-24 DIAGNOSIS — I10 Essential (primary) hypertension: Secondary | ICD-10-CM

## 2023-06-24 MED ORDER — METOPROLOL TARTRATE 50 MG TABLET
25.0000 mg | ORAL_TABLET | Freq: Two times a day (BID) | ORAL | Status: DC
Start: 2023-06-24 — End: 2023-07-25

## 2023-06-24 NOTE — Patient Instructions (Addendum)
Reduce metoprolol tartrate (Lopressor) to 25 mg twice daily. You will take half tablet twice daily until you run out. When you are in need of a refill, please call us, and we can consider switching it to the once a day metoprolol.   Please monitor your blood pressure and heart rate at home 1-2 hours after medicines, and call us with readings in 2 weeks.

## 2023-06-24 NOTE — Progress Notes (Signed)
CARDIOLOGY [facility] [place], MEDICAL OFFICE BUILDING  [address]  [place] [address]    Progress Note    Encounter Date: 06/24/2023    Patient ID:  [name] [name]  WGN:[id number]    DOB:   Age: 82 y.o. female  Subjective   Subjective:     Chief Complaint   Patient presents with    Follow Up    Shortness of Breath     HPI  [name] [name] is a pleasant 82 y.o. female presenting for follow up. She has a PMH of CAD s/p PCI to the LCx x 2 and more recently IVUS guided PCI on 01/19/2022 to the mid LCx x1 that was overlapped proximally with previously placed stent for unstable angina, HTN, HLD and DMT2. A TTE showed preserved EF at 68.5% and no significant valve disease. She used to follow with Dr. Dorita Fray for lipid management and remains on Repatha 140 mg every two weeks and Zetia 10 mg once daily. In April of 2023, she had syncope while at church. 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 She's following with vascular now and saw Dr. Vivien Rota in 07/2022. 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 A 14 day Holter monitor in 05/2022 showed sinus rhythm with no Afib, VT, heart block or pauses. Average heart rate was 65bpm. A loop recorder was placed in August of 2023 with a 4 second pause noted on her most recent remote transmission. Today, she reports some worsening fatigue and dyspnea lately. She also notes that she has been having intermittent left arm discomfort for the last few days that improves with changing positions. She denies any chest pain like she had prior to intervention. She also denies dizziness and syncope.         Current Outpatient Medications    Medication Sig    ALPRAZolam (XANAX) 0.5 mg Oral Tablet Take 1 Tablet (0.5 mg total) by mouth Every evening    amLODIPine (NORVASC) 5 mg Oral Tablet Take 1 Tablet (5 mg total) by mouth Once a day    bempedoic acid 180 mg Oral Tablet Take 1 Tablet (180 mg total) by mouth Once a day    Cholecalciferol, Vitamin D3, 10 mcg (400 unit) Oral Tablet, Chewable Chew Once a day    clopidogreL (PLAVIX) 75 mg Oral Tablet Take 1 Tablet (75 mg total) by mouth Once a day    empagliflozin (JARDIANCE) 10 mg Oral Tablet Take 1 Tablet (10 mg total) by mouth Once a day    evolocumab (REPATHA SURECLICK) 140 mg/mL Subcutaneous Pen Injector Inject 1 pen (140 mg) subcutaneously (under the skin) once every 14 days    ezetimibe (ZETIA) 10 mg Oral Tablet TAKE 1 TABLET (10 MG TOTAL) BY MOUTH EVERY EVENING    fluticasone propionate (FLONASE NASL) by Nasal route    Irbesartan-Hydrochlorothiazide 300-12.5 mg Oral Tablet Take 300 mg by mouth Once a day    Isosorbide Mononitrate (IMDUR) 120 mg Oral Tablet Sustained Release 24 hr Take 1 Tablet (120 mg total) by mouth Every morning    levothyroxine (SYNTHROID) 112 mcg Oral Tablet Take 1 Tablet (112 mcg total) by mouth Every morning  metoprolol tartrate (LOPRESSOR) 50 mg Oral Tablet Take 0.5 Tablets (25 mg total) by mouth Twice daily    nitroGLYCERIN (NITROSTAT) 0.4 mg Sublingual Tablet, Sublingual PLACE 1 TABLET UNDER TONGUE EVERY 5 MINS, UP TO 3 DOSES AS NEEDED FOR CHEST PAIN    pantoprazole (PROTONIX) 40 mg Oral Tablet, Delayed Release (E.C.) Take 1 Tablet (40 mg total) by mouth Once a day    POTASSIUM-99 ORAL Take by mouth Once a day    ranolazine (RANEXA) 500 mg Oral Tablet Sustained Release 12 hr Take 1 Tablet (500 mg total) by mouth Twice daily    valACYclovir (VALTREX) 500 mg Oral Tablet Take 1 Tablet (500 mg total) by mouth Twice daily    vitamin B complex (B COMPLEX-VITAMIN B12 ORAL) Take by mouth Once a day     Allergies   Allergen Reactions    Pneumococcal 23-Valent Polysaccharide  Vaccine  Other Adverse Reaction (Add comment)     Severe local swelling    Tramadol Nausea/ Vomiting     "makes me sick with or without food"    Iohexol  Other Adverse Reaction (Add comment) and Hives/ Urticaria      Desc: HIVES SEVERAL HRS S/P IV CONTRAST.. [place] LAST SCAN W/ BENADRYL PREMED @ [facility] '01/AC.    Lisinopril  Other Adverse Reaction (Add comment)     Other reaction(s): Cough    Other Rash     Other reaction(s): Other (See Comments)  Nickle   When teeth were wired in she place, patient obtained an infection and wiring had to be removed,  Also allergic to silver    Crestor [Rosuvastatin]      Muscle aches    Lovastatin     Statin [Atorvastatin]      Muscle ache    Iv Contrast Hives/ Urticaria    Nickel Hives/ Urticaria     Past Medical History:   Diagnosis Date    Cancer (CMS HCC)     skin cancer -- forehead  2024    Coronary artery disease     CVA (cerebral vascular accident) (CMS HCC) 2009    Dyspnea on exertion     Essential hypertension     Hyperlipidemia     PONV (postoperative nausea and vomiting)     Skin cancer     Stented coronary artery     Type 2 diabetes mellitus (CMS HCC)          Past Surgical History:   Procedure Laterality Date    CARDIAC CATHETERIZATION      CATARACT EXTRACTION, BILATERAL Bilateral     HX COLONOSCOPY      HX HEART CATHETERIZATION  2017 and 2018    Stented twice  prox circ  and circ    HX PARTIAL HYSTERECTOMY      tubes and ovaries left intact    HX THYROIDECTOMY  1978    HX WISDOM TEETH EXTRACTION      KNEE SURGERY  2018    muscle removed    LAPAROSCOPIC CHOLECYSTECTOMY      RECONSTRUCTION OF NOSE  1980    SKIN CANCER EXCISION      left side forehead         Family Medical History:       Problem Relation (Age of Onset)    Cardiomyopathy Father    Heart Attack Mother    Heart Disease Sister    Uterine Cancer Mother, Niece            Social  History     Tobacco Use    Smoking status: Former     Current packs/day: 0.50     Average packs/day: 0.5 packs/day for 10.0 years (5.0 ttl  pk-yrs)     Types: Cigarettes    Smokeless tobacco: Never   Vaping Use    Vaping status: Never Used   Substance Use Topics    Alcohol use: Not Currently     Alcohol/week: 1.0 standard drink of alcohol     Types: 1 Cans of beer per week     Comment: drank long time ago not current.    Drug use: Never       Review of Systems   All other systems reviewed and are negative.       Objective   Objective:   Vitals: BP (!) 138/99 (Site: Left Arm, Patient Position: Sitting)   Pulse 63   Ht 1.575 m (5\' 2" )   Wt 73.9 kg (163 lb)   SpO2 98%   BMI 29.81 kg/m         Physical Exam  Vitals and nursing note reviewed.   Constitutional:       General: She is not in acute distress.     Appearance: She is not ill-appearing or diaphoretic.   HENT:      Head: Normocephalic and atraumatic.   Cardiovascular:      Rate and Rhythm: Normal rate and regular rhythm.      Heart sounds: No murmur heard.  Pulmonary:      Effort: Pulmonary effort is normal. No respiratory distress.      Breath sounds: Normal breath sounds. No wheezing or rales.   Musculoskeletal:      Cervical back: Normal range of motion and neck supple.   Skin:     General: Skin is warm and dry.   Neurological:      Mental Status: She is alert and oriented to person, place, and time.   Psychiatric:         Behavior: Behavior normal.          Assessment & Plan:     ENCOUNTER DIAGNOSES     ICD-10-CM   1. CAD in native artery  I25.10   2. S/P angioplasty with stent  Z95.820   3. Essential hypertension  I10   4. HLD (hyperlipidemia)  E78.5   5. Statin intolerance  Z78.9     In summary, Ms. Wilkie is a very pleasant 82 year old lady with a PMH of CAD s/p PCI to the LCx x 2 and more recently IVUS guided PCI on 01/19/2022 to the mid LCx x1 that was overlapped proximally with previously placed stent for unstable angina, HTN, HLD and DMT2. A loop recorder was placed in August of 2023 with a 4 second pause noted on her most recent remote transmission. Today, she reports some worsening  fatigue and dyspnea lately. She also notes that she has been having intermittent left arm discomfort for the last few days that improves with changing positions. She denies any chest pain like she had prior to intervention. She also denies dizziness and syncope. Given fatigue and bradycardia with pause noted on loop recorder, will reduce Lopressor to 25 mg twice daily with plans to switch to Toprol XL when she is in need of a refill. She agrees to monitor her BP and HR and call us with readings in 2 weeks and will call us if she has worsened fatigue or dyspnea or if she  develops any symptoms that feel similar to what she experienced prior to PCI. May need to adjust antianginals and consider repeat cath if her symptoms worsen with reducing Lopressor.     Orders Placed This Encounter    metoprolol tartrate (LOPRESSOR) 50 mg Oral Tablet       Return in about 3 months (around 09/24/2023), or if symptoms worsen or fail to improve.    This patient was seen independently with supervising physician available for consultation.    Tyan Dy O'Kernick, PA-C

## 2023-06-25 ENCOUNTER — Encounter (INDEPENDENT_AMBULATORY_CARE_PROVIDER_SITE_OTHER): Payer: Medicare PPO | Admitting: Cardiovascular Disease

## 2023-06-25 DIAGNOSIS — R55 Syncope and collapse: Secondary | ICD-10-CM

## 2023-06-25 DIAGNOSIS — Z95818 Presence of other cardiac implants and grafts: Secondary | ICD-10-CM

## 2023-06-28 ENCOUNTER — Other Ambulatory Visit (INDEPENDENT_AMBULATORY_CARE_PROVIDER_SITE_OTHER): Payer: Self-pay | Admitting: Cardiovascular Disease

## 2023-07-01 ENCOUNTER — Ambulatory Visit (INDEPENDENT_AMBULATORY_CARE_PROVIDER_SITE_OTHER): Payer: Self-pay | Admitting: Medical

## 2023-07-01 NOTE — Telephone Encounter (Signed)
She called in leaving a message that her bps have been running low since changing her Metoprolol last week.    BPs have been...    92/46 felt like eyes were swimming  112/51

## 2023-07-19 ENCOUNTER — Other Ambulatory Visit: Payer: Self-pay

## 2023-07-19 ENCOUNTER — Encounter (INDEPENDENT_AMBULATORY_CARE_PROVIDER_SITE_OTHER): Payer: Self-pay | Admitting: Vascular Surgery

## 2023-07-22 ENCOUNTER — Other Ambulatory Visit: Payer: Self-pay

## 2023-07-25 ENCOUNTER — Ambulatory Visit (INDEPENDENT_AMBULATORY_CARE_PROVIDER_SITE_OTHER): Payer: Medicare PPO | Admitting: Vascular Surgery

## 2023-07-25 ENCOUNTER — Other Ambulatory Visit: Payer: Self-pay

## 2023-07-25 ENCOUNTER — Other Ambulatory Visit (INDEPENDENT_AMBULATORY_CARE_PROVIDER_SITE_OTHER): Payer: Self-pay | Admitting: Cardiovascular Disease

## 2023-07-25 ENCOUNTER — Other Ambulatory Visit (INDEPENDENT_AMBULATORY_CARE_PROVIDER_SITE_OTHER): Payer: Self-pay | Admitting: Medical

## 2023-07-25 ENCOUNTER — Encounter (INDEPENDENT_AMBULATORY_CARE_PROVIDER_SITE_OTHER): Payer: Self-pay | Admitting: Vascular Surgery

## 2023-07-25 VITALS — BP 89/46 | HR 67 | Ht 62.5 in | Wt 159.2 lb

## 2023-07-25 DIAGNOSIS — Z8673 Personal history of transient ischemic attack (TIA), and cerebral infarction without residual deficits: Secondary | ICD-10-CM

## 2023-07-25 DIAGNOSIS — I7090 Unspecified atherosclerosis: Secondary | ICD-10-CM

## 2023-07-25 MED ORDER — METOPROLOL SUCCINATE ER 25 MG TABLET,EXTENDED RELEASE 24 HR
25.0000 mg | ORAL_TABLET | Freq: Every day | ORAL | 0 refills | Status: DC
Start: 2023-07-25 — End: 2023-09-05

## 2023-07-25 NOTE — Telephone Encounter (Signed)
Me     LP    07/01/23 12:14 PM  Note  She called in leaving a message that her bps have been running low since changing her Metoprolol last week.     BPs have been...     92/46 felt like eyes were swimming  112/51                LP    07/01/23 12:14 PM  You routed this conversation to O'Kernick, Ashlee, PA-C  O'Kernick, Ashlee, PA-C   to Me   AO    07/01/23 12:32 PM  We reduced her medicine so if anything her BP should be higher. Has she been drinking enough water, had any illness with vomiting or diarrhea? Anything else change?  Ashlee O'Kernick, PA-C  07/01/2023, 12:32  Me   to O'Kernick, Bridgett Larsson, PA-C   LP    07/01/23  2:58 PM  Pt. states that she has had no illnesses or any changes and is drinking plenty of water.  O'Kernick, Ashlee, PA-C   to Me   AO    07/01/23  3:09 PM  We can switch to Toprol XL 25 mg once daily if she is agreeable. That would be a further dose reduction.  Ashlee O'Kernick, PA-C  07/01/2023, 15:09  July 25, 2023  Me   to Robby Sermon   LP    07/25/23  2:52 PM  She is ok to switch to once daily Metoprolol 25mg .  Ricka Burdock, [place] 07/25/2023 14:52

## 2023-07-25 NOTE — Progress Notes (Signed)
Vascular Surgery  Follow-Up Clinic Note                    Date: 07/25/2023  Patient: [name] [name]  MRN: [id number]  DOB:   PCP: Marko Stai, PA-C    Chief Complaint: Hypotension  Chief Complaint   Patient presents with    Follow Up     The patient has the following vascular surgery history:   -None     Subjective:  HPI: [name] [name] is a 82 y.o. White female who presents for follow up for carotid artery evaluation. She was first seen by Dr. Vivien Rota 07/20/22 for a CTA which was read as severe left carotid artery stenosis, however on independent review of the imaging there was < 50% stenosis bilaterally. Subsequent ultrasound imaging has also demonstrated < 50% stenosis. She presents today for follow-up with carotid imaging for surveillance.     She feels "crappy" due to low blood pressure. This has been going on a while, her providers are aware of this and she is working with her cardiologist to adjust her blood pressure meds for this. She has a history of a stroke in 2012, she states on the "right side" and was due to hypertension. Prior notes indicate a small posterior frontoparietal stroke on MRI from 2012. She has left hand and foot numbness which are residual from her prior stroke. She denies any changes in these these symptoms, or any new symptoms of TIA or stroke including vision changes consistent with amaurosis fugax. She has no other complaints today.     Denies any other changes in PMHx, PSxH, Social Hx, FHx or medications since last visit.     Focused Review of Systems:  General: Denies fevers, chills  Respiratory: Denies shortness of breath  Cardiovascular: Denies chest pain    Allergies   Allergen Reactions    Pneumococcal 23-Valent Polysaccharide Vaccine  Other Adverse Reaction (Add comment)     Severe local swelling    Tramadol Nausea/ Vomiting     "makes me sick with or without food"    Iohexol  Other Adverse Reaction (Add comment) and Hives/ Urticaria      Desc: HIVES SEVERAL HRS S/P IV  CONTRAST.. [place] LAST SCAN W/ BENADRYL PREMED @ [facility] '01/AC.    Lisinopril  Other Adverse Reaction (Add comment)     Other reaction(s): Cough    Other Rash     Other reaction(s): Other (See Comments)  Nickle   When teeth were wired in she place, patient obtained an infection and wiring had to be removed,  Also allergic to silver    Crestor [Rosuvastatin]      Muscle aches    Lovastatin     Statin [Atorvastatin]      Muscle ache    Iv Contrast Hives/ Urticaria    Nickel Hives/ Urticaria       Current Outpatient Medications   Medication Sig Dispense Refill    ALPRAZolam (XANAX) 0.5 mg Oral Tablet Take 1 Tablet (0.5 mg total) by mouth Every evening      amLODIPine (NORVASC) 5 mg Oral Tablet Take 1 Tablet (5 mg total) by mouth Once a day 60 Tablet 2    bempedoic acid 180 mg Oral Tablet Take 1 Tablet (180 mg total) by mouth Once a day 90 Tablet 3    Cholecalciferol, Vitamin D3, 10 mcg (400 unit) Oral Tablet, Chewable Chew Once a day      clopidogreL (PLAVIX) 75 mg Oral Tablet Take  1 Tablet (75 mg total) by mouth Once a day      empagliflozin (JARDIANCE) 10 mg Oral Tablet Take 1 Tablet (10 mg total) by mouth Once a day 90 Tablet 4    evolocumab (REPATHA SURECLICK) 140 mg/mL Subcutaneous Pen Injector Inject 1 pen (140 mg) subcutaneously (under the skin) once every 14 days 2 mL 12    ezetimibe (ZETIA) 10 mg Oral Tablet TAKE 1 TABLET (10 MG TOTAL) BY MOUTH EVERY EVENING 90 Tablet 3    fluticasone propionate (FLONASE NASL) by Nasal route      Irbesartan-Hydrochlorothiazide 300-12.5 mg Oral Tablet Take 300 mg by mouth Once a day      Isosorbide Mononitrate (IMDUR) 120 mg Oral Tablet Sustained Release 24 hr Take 1 Tablet (120 mg total) by mouth Every morning 90 Tablet 3    levothyroxine (SYNTHROID) 112 mcg Oral Tablet Take 1 Tablet (112 mcg total) by mouth Every morning      metoprolol succinate (TOPROL-XL) 25 mg Oral Tablet Sustained Release 24 hr Take 1 Tablet (25 mg total) by mouth Once a day 90 Tablet 0    nitroGLYCERIN  (NITROSTAT) 0.4 mg Sublingual Tablet, Sublingual PLACE 1 TABLET UNDER TONGUE EVERY 5 MINS, UP TO 3 DOSES AS NEEDED FOR CHEST PAIN 30 Tablet 3    pantoprazole (PROTONIX) 40 mg Oral Tablet, Delayed Release (E.C.) Take 1 Tablet (40 mg total) by mouth Once a day      POTASSIUM-99 ORAL Take by mouth Once a day      ranolazine (RANEXA) 500 mg Oral Tablet Sustained Release 12 hr Take 1 Tablet (500 mg total) by mouth Twice daily 180 Tablet 3    valACYclovir (VALTREX) 500 mg Oral Tablet Take 1 Tablet (500 mg total) by mouth Twice daily      vitamin B complex (B COMPLEX-VITAMIN B12 ORAL) Take by mouth Once a day       No current facility-administered medications for this visit.       Cardiovascular Medications:  ASA: No  Statin: No - on repatha   Other Antiplatelets (Plavix, Brilinta, Effient): Yes  Anticoagulant: No    Objective:  Physical Exam:   BP (!) 89/46 (Site: Right Arm)   Pulse 67   Ht 1.588 m (5' 2.5")   Wt 72.2 kg (159 lb 3.2 oz)   SpO2 96%   BMI 28.65 kg/m     Constitutional:  No acute distress, pleasant   HEENT: Normocephalic  Respiratory: Clear to auscultation bilaterally, non-labored respirations   Cardiac:  Regular rate and rhythm  Gastrointestinal: Non-distended  Extremities: No deformities   Neurologic: Awake and alert, oriented x3, grossly neurologically intact, CN II-XII grossly intact, bilateral upper and lower extremities grossly sensation and motor intact, strength 5/5 in upper and lower extremities and equal bilaterally   Vascular Exam:   Carotid Bruits: No    Laboratory Results:     Lab Results   Component Value Date    TRIG 147 01/19/2022    HDLCHOL 51 01/19/2022    LDLCHOL 94 01/19/2022    CHOLESTEROL 171 01/19/2022     Imaging Tests:  CTA carotid extracranial 03/28/22   I personally reviewed this imaging and agree with Dr. Vivien Rota evaluation that bilateral carotid atherosclerosis with < 50% stenosis of internal carotid arteries bilaterally    Carotid artery duplex 07/20/22    Carotid artery duplex  05/29/23        Risk Factors and Comorbid Conditions:  History of previous carotid artery surgery and/or  stenting: No  Peripheral Vascular Disease: Atherosclerosis of the extremities: No  With Gangrene: no. Intermittent claudication: no. Rest pain: no.  Wound /Ulcer present: No  Renal Failure/Insuficiency: Mikes IP Heart Failure Renal Failure Yes No If yes type: Yes,       - Chronic Kidney Disease  (CKD)Stage 3 = GFR 30-59  Heart Failure: no  Diabetes Mellitus  (HbA1C >6.5, Fasting >126 or Random glucose >200 with hyperglycemic symptoms):  Yes.  Diabetes Therapy: Oral Meds    Assessment  Assessment/Plan   1. Atherosclerosis    2. History of stroke      This is a 82 y.o. female with a history of stroke in 2012 concern for carotid artery stenosis. On independent review of prior CTA imaging in 2023 as well as multiple subsequent carotid duplex imaging 2023 and 2024 she does not have significant carotid stenosis.     Plan  -Given no significant carotid stenosis and stability on surveillance imaging do not recommended continued surveillance.   -Recommend continued medical management of vascular disease and vascular disease risk factors with continued hyperlipidemia management. Patient is on Plavix for CAD. BP control per cardiology.   -Further follow-up with vascular surgery as needed.     Patient was given the opportunity to ask questions and those questions were answered to their satisfaction. Instructed to call with any further questions or concerns.     / Rosamary Boudreau A. Deatra James, MD, RPVI   Vascular Surgeon  Assistant Professor of Surgery  Division of Vascular Surgery   [facility]

## 2023-07-25 NOTE — Nursing Note (Signed)
Pt did not have a med list with them at visit - went over list with patient   Advised patient to check AVS list with medicines at home and call if any discrepancies    -Confirmed correct pharmacy with patient for e-scribing   Ricka Burdock, [place] 07/25/2023 14:41

## 2023-07-26 ENCOUNTER — Encounter (INDEPENDENT_AMBULATORY_CARE_PROVIDER_SITE_OTHER): Payer: Medicare PPO | Admitting: Cardiovascular Disease

## 2023-07-26 DIAGNOSIS — R55 Syncope and collapse: Secondary | ICD-10-CM

## 2023-07-26 DIAGNOSIS — Z95818 Presence of other cardiac implants and grafts: Secondary | ICD-10-CM

## 2023-07-29 ENCOUNTER — Other Ambulatory Visit: Payer: Self-pay

## 2023-07-30 ENCOUNTER — Ambulatory Visit (HOSPITAL_BASED_OUTPATIENT_CLINIC_OR_DEPARTMENT_OTHER): Payer: Self-pay | Admitting: Medical

## 2023-07-30 ENCOUNTER — Other Ambulatory Visit: Payer: Self-pay

## 2023-08-01 ENCOUNTER — Other Ambulatory Visit: Payer: Self-pay

## 2023-08-06 ENCOUNTER — Other Ambulatory Visit: Payer: Self-pay

## 2023-08-06 ENCOUNTER — Other Ambulatory Visit (HOSPITAL_BASED_OUTPATIENT_CLINIC_OR_DEPARTMENT_OTHER): Payer: Self-pay | Admitting: Medical

## 2023-08-06 DIAGNOSIS — M1711 Unilateral primary osteoarthritis, right knee: Secondary | ICD-10-CM

## 2023-08-15 ENCOUNTER — Encounter (HOSPITAL_BASED_OUTPATIENT_CLINIC_OR_DEPARTMENT_OTHER): Payer: Self-pay | Admitting: Medical

## 2023-08-15 ENCOUNTER — Other Ambulatory Visit: Payer: Self-pay

## 2023-08-15 ENCOUNTER — Ambulatory Visit: Payer: Medicare PPO | Attending: Medical | Admitting: Medical

## 2023-08-15 ENCOUNTER — Inpatient Hospital Stay (HOSPITAL_BASED_OUTPATIENT_CLINIC_OR_DEPARTMENT_OTHER)
Admission: RE | Admit: 2023-08-15 | Discharge: 2023-08-15 | Disposition: A | Discharge status: Home / Self Care | Payer: Medicare PPO | Source: Ambulatory Visit | Admitting: Radiology

## 2023-08-15 VITALS — Ht 62.52 in | Wt 159.0 lb

## 2023-08-15 DIAGNOSIS — M21162 Varus deformity, not elsewhere classified, left knee: Secondary | ICD-10-CM

## 2023-08-15 DIAGNOSIS — M1711 Unilateral primary osteoarthritis, right knee: Secondary | ICD-10-CM

## 2023-08-15 DIAGNOSIS — M21161 Varus deformity, not elsewhere classified, right knee: Secondary | ICD-10-CM | POA: Insufficient documentation

## 2023-08-15 MED ORDER — TRIAMCINOLONE ACETONIDE ER 32 MG INTRA-ARTICULAR SUSPENSION EXT.RELEAS
32.0000 mg | Freq: Once | INTRA_ARTICULAR | Status: AC
Start: 2023-08-15 — End: 2023-08-15
  Administered 2023-08-15: 32 mg via INTRA_ARTICULAR

## 2023-08-15 NOTE — Addendum Note (Signed)
Addended by: Darlen Round on: 08/15/2023 02:23 PM     Modules accepted: Orders

## 2023-08-15 NOTE — Progress Notes (Addendum)
Orthopaedics & Sports Medicine  [address]  [place], [place] [address]  [phone]       OFFICE VISIT    PATIENT NAME:  [name] [name]  MEDICAL RECORD NUMBER: [id number]    DICTATING PHYSICIAN:  Launa Flight, PA-C  REFERRING PHYSICIAN:  Self, Referral    DOB:    DOS:  08/15/2023    CHIEF COMPLAINT:  right Knee Pain      HISTORY OF PRESENT ILLNESS:  Ayelen Laplume is a 82 y.o. female.  Comes to the office today for follow up consultation in regards to ongoing right knee pain.  Patient states that this has been ongoing for many years.  she cannot recall one specific injury that caused the pain.  Patient tells me that their symptoms are worse with weight bearing activities and ambulating on uneven ground and downward grades.  she has pain that awakens them from sleeping. In addition to modifying the activities that create pain, she has attempted NSAIDs, tylenol, steroid injections, Visco injections, PRP injections, Zilretta in the right knee.  She states the Zilretta injection helped her for over 4 months.  She has multiple health issues including cardiac problems, h/o stroke.  At this point the patient is here today to discuss further evaluation of their knee pain.     PAST MEDICAL HISTORY:  Past Medical History:   Diagnosis Date    Cancer (CMS [facility])     skin cancer -- forehead  2024    Coronary artery disease     CVA (cerebral vascular accident) (CMS HCC) 2009    Dyspnea on exertion     Essential hypertension     Hyperlipidemia     PONV (postoperative nausea and vomiting)     Skin cancer     Stented coronary artery     Type 2 diabetes mellitus (CMS HCC)            PAST SURGICAL HISTORY:  Past Surgical History:   Procedure Laterality Date    CARDIAC CATHETERIZATION      CATARACT EXTRACTION, BILATERAL Bilateral     HX COLONOSCOPY      HX HEART CATHETERIZATION  2017 and 2018    Stented twice  prox circ  and circ    HX PARTIAL HYSTERECTOMY      tubes and ovaries left intact    HX THYROIDECTOMY  1978    HX  WISDOM TEETH EXTRACTION      KNEE SURGERY  2018    muscle removed    LAPAROSCOPIC CHOLECYSTECTOMY      RECONSTRUCTION OF NOSE  1980    SKIN CANCER EXCISION      left side forehead           MEDICATIONS:  Current Outpatient Medications   Medication Sig    ALPRAZolam (XANAX) 0.5 mg Oral Tablet Take 1 Tablet (0.5 mg total) by mouth Every evening    amLODIPine (NORVASC) 5 mg Oral Tablet Take 1 Tablet (5 mg total) by mouth Once a day    bempedoic acid 180 mg Oral Tablet Take 1 Tablet (180 mg total) by mouth Once a day    Cholecalciferol, Vitamin D3, 10 mcg (400 unit) Oral Tablet, Chewable Chew Once a day    clopidogreL (PLAVIX) 75 mg Oral Tablet Take 1 Tablet (75 mg total) by mouth Once a day    empagliflozin (JARDIANCE) 10 mg Oral Tablet Take 1 Tablet (10 mg total) by mouth Once a day    evolocumab (REPATHA SURECLICK) 140  mg/mL Subcutaneous Pen Injector Inject 1 pen (140 mg) subcutaneously (under the skin) once every 14 days    ezetimibe (ZETIA) 10 mg Oral Tablet TAKE 1 TABLET (10 MG TOTAL) BY MOUTH EVERY EVENING    fluticasone propionate (FLONASE NASL) by Nasal route    Irbesartan-Hydrochlorothiazide 300-12.5 mg Oral Tablet Take 300 mg by mouth Once a day    Isosorbide Mononitrate (IMDUR) 120 mg Oral Tablet Sustained Release 24 hr Take 1 Tablet (120 mg total) by mouth Every morning    levothyroxine (SYNTHROID) 112 mcg Oral Tablet Take 1 Tablet (112 mcg total) by mouth Every morning    metoprolol succinate (TOPROL-XL) 25 mg Oral Tablet Sustained Release 24 hr Take 1 Tablet (25 mg total) by mouth Once a day    nitroGLYCERIN (NITROSTAT) 0.4 mg Sublingual Tablet, Sublingual PLACE 1 TABLET UNDER TONGUE EVERY 5 MINS, UP TO 3 DOSES AS NEEDED FOR CHEST PAIN    pantoprazole (PROTONIX) 40 mg Oral Tablet, Delayed Release (E.C.) Take 1 Tablet (40 mg total) by mouth Once a day    POTASSIUM-99 ORAL Take by mouth Once a day    ranolazine (RANEXA) 500 mg Oral Tablet Sustained Release 12 hr Take 1 Tablet (500 mg total) by mouth Twice  daily    valACYclovir (VALTREX) 500 mg Oral Tablet Take 1 Tablet (500 mg total) by mouth Twice daily    vitamin B complex (B COMPLEX-VITAMIN B12 ORAL) Take by mouth Once a day       ALLERGIES:  Allergy History as of 08/15/23       IV CONTRAST         Noted Status Severity Type Reaction    08/28/19 1339 Octavia, Baxter, LPN 29/52/84 Active Low  Hives/ Urticaria              NICKEL         Noted Status Severity Type Reaction    08/28/19 1339 [place], [place], LPN 13/24/40 Active Low  Hives/ Urticaria              ATORVASTATIN         Noted Status Severity Type Reaction    05/24/21 1302 Calain, Amy, RN 05/24/21 Active       Comments: Muscle ache               ROSUVASTATIN         Noted Status Severity Type Reaction    05/24/21 1303 Calain, Amy, RN 05/24/21 Active       Comments: Muscle aches               LOVASTATIN         Noted Status Severity Type Reaction    05/24/21 1303 Lorane Gell, RN 05/24/21 Active                 IOHEXOL         Noted Status Severity Type Reaction    01/18/22 0401 Judi Saa, RN 08/15/05 Active Medium Side Effect  Other Adverse Reaction (Add comment), Hives/ Urticaria    Comments:  Desc: HIVES SEVERAL HRS S/P IV CONTRAST.. [place] LAST SCAN W/ BENADRYL PREMED @ [facility] '01/AC.     07/26/21 1424 Flesher, Mitchel Honour, LPN 09/28/24 Active Medium  Hives/ Urticaria,  Other Adverse Reaction (Add comment)    Comments:  Desc: HIVES SEVERAL HRS S/P IV CONTRAST.. [place] LAST SCAN W/ BENADRYL PREMED @ [facility] '01/AC.               LISINOPRIL  Noted Status Severity Type Reaction    01/18/22 0402 Judi Saa, RN 05/25/14 Active Medium Side Effect  Other Adverse Reaction (Add comment)    Comments: Other reaction(s): Cough     07/26/21 1424 Maye Hides, LPN 95/28/41 Active Medium      Comments: Other reaction(s): Cough               PNEUMOCOCCAL 23-VALENT POLYSACCHARIDE VACCINE         Noted Status Severity Type Reaction    01/18/22 0401 Judi Saa, RN 05/25/14 Active High Systemic  Other Adverse Reaction (Add  comment)    Comments: Severe local swelling     07/26/21 1424 Maye Hides, LPN 32/44/01 Active High   Other Adverse Reaction (Add comment)    Comments: Severe local swelling               TRAMADOL         Noted Status Severity Type Reaction    01/18/22 0401 Judi Saa, RN 10/21/18 Active High Side Effect Nausea/ Vomiting    Comments: "makes me sick with or without food"     07/26/21 1424 Maye Hides, LPN 02/72/53 Active High  Nausea/ Vomiting    Comments: "makes me sick with or without food"               OTHER         Noted Status Severity Type Reaction    01/18/22 0401 Judi Saa, RN 01/12/16 Active Medium Side Effect Rash    Comments: Other reaction(s): Other (See Comments)  Nickle   When teeth were wired in she place, patient obtained an infection and wiring had to be removed,  Also allergic to silver     07/26/21 1425 Maye Hides, LPN 66/44/03 Active Medium  Rash    Comments: Other reaction(s): Other (See Comments)  Nickle   When teeth were wired in she place, patient obtained an infection and wiring had to be removed,  Also allergic to silver                     FAMILY HISTORY:  Family Medical History:       Problem Relation (Age of Onset)    Cardiomyopathy Father    Heart Attack Mother    Heart Disease Sister    Uterine Cancer Mother, Niece                SOCIAL HISTORY:  Social History     Socioeconomic History    Marital status: Married     Spouse name: Not on file    Number of children: Not on file    Years of education: Not on file    Highest education level: Not on file   Occupational History    Not on file   Tobacco Use    Smoking status: Former     Current packs/day: 0.50     Average packs/day: 0.5 packs/day for 10.0 years (5.0 ttl pk-yrs)     Types: Cigarettes    Smokeless tobacco: Never   Vaping Use    Vaping status: Never Used   Substance and Sexual Activity    Alcohol use: Not Currently     Alcohol/week: 1.0 standard drink of alcohol     Types: 1 Cans of beer per week      Comment: drank long time ago not current.    Drug use: Never    Sexual activity: Not on file  Other Topics Concern    Ability to Walk 1 Flight of Steps without SOB/CP No    Routine Exercise Not Asked    Ability to Walk 2 Flight of Steps without SOB/CP Not Asked    Unable to Ambulate Not Asked    Total Care Not Asked    Ability To Do Own ADL's Yes    Uses Walker Not Asked    Other Activity Level Not Asked    Uses Cane Not Asked   Social History Narrative    Not on file     Social Determinants of Health     Financial Resource Strain: Not on file   Transportation Needs: Not on file   Social Connections: Not on file   Intimate Partner Violence: Not on file   Housing Stability: Not on file       REVIEW OF SYSTEMS  Constitutional: negative  Musculoskeletal:positive for Right knee pain  All other ROS Negative    PHYSICAL EXAMINATION:    Ht 1.588 m (5' 2.52")   Wt 72.1 kg (159 lb)   BMI 28.60 kg/m         GENERAL: she is alert, cooperative, no distress, appears stated age.      ORTHOPEDIC EVALUATION:    Patient displays an abnormal gait.  Patient favors the rightlower extremity.  Gross inspection reveals normal mechanical and anatomic axis of the lower extremities with no appreciable leg length discrepancy.  No tenderness found with AP or lateral compression testing of the pelvis.    Right hip shows full PROM (Forward flexion 0-115 degrees, internal rotation 0-35 degrees, external rotation 0-45 degrees). McCarthy's Maneuvers are negative for impingement. FABRE testing negative. No tenderness to palpation over the greater trochanter.  Straight leg testing is negative for radiculopathy.  Compartments are supple.  Skin is clear.  +5/5 hip flexors/extenders, as well as, knee flexors and extenders.  Intact sensory exam along all dermatomes.    Left hip shows full PROM (Forward flexion 0-115 degrees, internal rotation 0-35 degrees, external rotation 0-45 degrees). McCarthy's Maneuvers are negative for impingement. FABRE  testing negative. No tenderness to palpation over the greater trochanter.  Straight leg testing is negative for radiculopathy.  Compartments are supple.  Skin is clear.  +5/5 hip flexors/extenders, as well as, knee flexors and extenders.  Intact sensory exam along all dermatomes.    Right knee shows full PROM (0-120 degrees).  Patella tracks midline without instability.  Varus deformity.  Moderate tenderness to palpation of the distal femur and proximal tibia. Clark's patella-femoral test is positivefor crepitus and pain.  No effusion is present.  Lachman's test is negative.  Posterior Drawer test is negative.  Collateral ligamental testing is stable at 30 degrees of extension, mid-flexion, and terminal flexion.  McMurrary testing is negative.  No Baker's cyst is present.  Skin is clear.  Normoreflexic.   Left knee shows full PROM (0-120 degrees).  Patella tracks midline without instability.  Varus deformity.  Mild tenderness to palpation of the distal femur and proximal tibia. Clark's patella-femoral test is positivefor crepitus and pain.  No effusion is present.  Lachman's test is negative.  Posterior Drawer test is negative.  Collateral ligamental testing is stable at 30 degrees of extension, mid-flexion, and terminal flexion.  McMurrary testing is negative.  No Baker's cyst is present.  Skin is clear.  Normoreflexic.    Right Ankle shows full PROM in plantar and dorsiflexion.  Stable to ligamental testing.  Skin is clear. +5/5 motor strength  to plantar/dorsiflexion, inversion/eversion, and EHL.  Intact sensory to light-touch.  +2/4 dorsalis pedis and posterior tibial pulse.    Left Ankle shows full PROM in plantar and dorsiflexion.  Stable to ligamental testing.  Skin is clear. +5/5 motor strength to plantar/dorsiflexion, inversion/eversion, and EHL.  Intact sensory to light-touch.  +2/4 dorsalis pedis and posterior tibial pulse.          IMAGING:      PROCEDURE PERFORMED DATE AND TIME: 08/15/2023 12:48 PM      CLINICAL INDICATION: M17.11: Primary osteoarthritis of right knee     TECHNIQUE: 4 views / 4 images submitted.     COMPARISON: 02/21/2023        FINDINGS: There is mild lateral tibial subluxation. There is severe medial compartment joint space narrowing with subchondral sclerosis. There is tricompartmental degenerative spurring/osteophyte formation. Small joint effusion. No evidence for erosive arthropathy. No evidence for fracture.     IMPRESSION:  Osteoarthrosis, severe at the lateral joint compartment.               IMPRESSION:      ICD-10-CM    1. Primary osteoarthritis of right knee  M17.11 Zilretta Joint Injection Schedule/Auth      2. 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  We have discussed the pros and cons of both conservative, as well as, surgical intervention.After discussion we are going to continue with conservative management. I have reviewed alternative to exercise including aquatic therapy.  She is a high risk for surgical complications with her overall health issues.  She would like to continue with non-operative management.  We discussed pharmacologic treatment with tylenol.  In addition, injection management will be initiated with Zilretta.  All questions were answered to their completeness.  We will continue to monitor their symptoms as an outpatient.       PROCEDURE:   I reviewed with the patient today the risks, benefits, rationale, expected outcomes and potential complications of undergoing injection management.  Consent has been obtained and a signed copy has been placed on the chart.  Patient identified their right knee was to be injected.   The patient was positioned with their knees dangling from a seated position.  The anterior lateral portal of the right knee was prepped using sterile technique.  A  1 unit vial of  Zilretta was injected  into the right knee.  The patient tolerated this without complaints.          This note was partially generated using MModal Fluency Direct system, and there may be some incorrect words, spellings, and punctuation that were not noted in checking the note before saving.    Launa Flight, PA-C   [facility] - Orthopaedics & Sports Medicine  [facility]

## 2023-08-16 ENCOUNTER — Other Ambulatory Visit: Payer: Self-pay

## 2023-08-19 ENCOUNTER — Ambulatory Visit (INDEPENDENT_AMBULATORY_CARE_PROVIDER_SITE_OTHER): Payer: Self-pay | Admitting: Medical

## 2023-08-19 NOTE — Telephone Encounter (Addendum)
-----   Message from [place]el M sent at 08/19/2023  1:19 PM EDT -----  Pt called and said she has been taking Metoprolol and she said she hasn't been feeling well on it. SOB. She said she recently had Covid and she is not sure if that is it.      O'Kernick, Ashlee, PA-C  [place], [place], RN  She also had pauses on her monitor, and we were weaning her down. If she is still taking Toprol XL 25 mg once daily, she can reduce to 12.5 mg once daily. Monitor BP and HR for 1 week with plans to consider stopping it completely at that point.  Ashlee O'Kernick, PA-C  08/19/2023, 14:21        Pt notified with information above and verbalized understanding. Patient is agreeable to above information and will call us in 1 week with readings.   [facility] [place], RN 08/19/2023 15:30

## 2023-08-26 ENCOUNTER — Encounter (INDEPENDENT_AMBULATORY_CARE_PROVIDER_SITE_OTHER): Payer: Medicare PPO | Admitting: Cardiovascular Disease

## 2023-08-26 DIAGNOSIS — Z95818 Presence of other cardiac implants and grafts: Secondary | ICD-10-CM

## 2023-08-26 DIAGNOSIS — R55 Syncope and collapse: Secondary | ICD-10-CM

## 2023-08-29 ENCOUNTER — Other Ambulatory Visit (INDEPENDENT_AMBULATORY_CARE_PROVIDER_SITE_OTHER): Payer: Self-pay | Admitting: Cardiovascular Disease

## 2023-09-05 ENCOUNTER — Other Ambulatory Visit (INDEPENDENT_AMBULATORY_CARE_PROVIDER_SITE_OTHER): Payer: Self-pay | Admitting: Medical

## 2023-09-05 MED ORDER — METOPROLOL TARTRATE 25 MG TABLET
12.5000 mg | ORAL_TABLET | Freq: Two times a day (BID) | ORAL | 3 refills | Status: DC
Start: 2023-09-05 — End: 2024-01-17

## 2023-09-05 NOTE — Telephone Encounter (Addendum)
-----   Message from [place] T sent at 09/05/2023 10:43 AM EDT -----    metoprolol succinate (TOPROL-XL) 25 mg Oral Tablet Sustained Release 24 hr     Patient called in has questions about this medication and or concerns about this medication        ----- Message -----   From: Dimple Casey, RN   Sent: 09/05/2023  11:22 AM EDT   To: Bridgett Larsson O'Kernick, PA-C     Called and spoke with patient, she states that she is still taking metoprolol succ 12.5mg  a day and she is still feeling bad. Patient states that she feels like she has a heavy chest and dizziness. Patient states that she actually feels worse on the lower dose of the metoprolol succ. She would like to go back to the tartate if possible.   [facility], RN 09/05/2023 11:22           O'Kernick, Ashlee, PA-C  [place], [place], RN  We can go back to 12.5 mg twice daily of Lopressor and have her monitor her BP and HR at home. We can adjust if needed.  Ashlee O'Kernick, PA-C  09/05/2023, 12:06        Called and spoke with patient, she is agreeable to above information. She is needing 25 mg tablets as she only has 50 mg at home.   [facility] [place], RN 09/05/2023 15:26

## 2023-09-24 ENCOUNTER — Ambulatory Visit (INDEPENDENT_AMBULATORY_CARE_PROVIDER_SITE_OTHER): Payer: Medicare PPO | Admitting: NURSE PRACTITIONER, FAMILY

## 2023-09-24 ENCOUNTER — Encounter (INDEPENDENT_AMBULATORY_CARE_PROVIDER_SITE_OTHER): Payer: Self-pay | Admitting: NURSE PRACTITIONER, FAMILY

## 2023-09-24 ENCOUNTER — Other Ambulatory Visit: Payer: Self-pay

## 2023-09-24 VITALS — BP 139/65 | HR 76 | Ht 62.5 in | Wt 157.2 lb

## 2023-09-24 DIAGNOSIS — Z95820 Peripheral vascular angioplasty status with implants and grafts: Secondary | ICD-10-CM

## 2023-09-24 DIAGNOSIS — I10 Essential (primary) hypertension: Secondary | ICD-10-CM

## 2023-09-24 DIAGNOSIS — Z95818 Presence of other cardiac implants and grafts: Secondary | ICD-10-CM

## 2023-09-24 DIAGNOSIS — E785 Hyperlipidemia, unspecified: Secondary | ICD-10-CM

## 2023-09-24 DIAGNOSIS — Z87891 Personal history of nicotine dependence: Secondary | ICD-10-CM

## 2023-09-24 DIAGNOSIS — Z8249 Family history of ischemic heart disease and other diseases of the circulatory system: Secondary | ICD-10-CM

## 2023-09-24 DIAGNOSIS — I2510 Atherosclerotic heart disease of native coronary artery without angina pectoris: Secondary | ICD-10-CM

## 2023-09-24 NOTE — Nursing Note (Signed)
Pt did not have a med list with them at visit - went over list with patient   Advised patient to check AVS list with medicines at home and call if any discrepancies    -Confirmed correct pharmacy with patient for e-scribing   Lorane Gell, RN 09/24/2023 11:23 AM

## 2023-09-24 NOTE — Progress Notes (Unsigned)
CARDIOLOGY [facility], MEDICAL OFFICE BUILDING  [address]  [place] [address]    Progress Note    Encounter Date: 09/24/2023    Patient ID:  [name] [name]  ZHY:[id number]    DOB: May 21, 1941  Age: 82 y.o. female  Subjective   Subjective:     Chief Complaint   Patient presents with    Follow Up    No Complaints     HPI    [name] [name] is a pleasant 82 y.o. female presenting for follow up. She has a PMH of CAD s/p PCI to the LCx x 2 and more recently IVUS guided PCI on 01/19/2022 to the mid LCx x1 that was overlapped proximally with previously placed stent for unstable angina, HTN, HLD and DMT2. A TTE showed preserved EF at 68.5% and no significant valve disease. She used to follow with Dr. Dorita Fray for lipid management and remains on Repatha 140 mg every two weeks and Zetia 10 mg once daily. In April of 2023, she had syncope while at church. 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 She's following with vascular now and saw Dr. Vivien Rota in 07/2022. 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 A 14 day Holter monitor in 05/2022 showed sinus rhythm with no Afib, VT, heart block or pauses. Average heart rate was 65bpm. Her most recent transmission from her loop recorder showed bradycardic readings, however her monitor is undersensing, and likely those are inaccurate readings. Today, she notes that she is doing very well. She states that since she was able to straighten out her thyroid, her symptoms have resolved. She denies any chest pain, shortness of breath, palpitations, dizziness, syncope, TIA and stroke like symptoms.     Current Outpatient Medications    Medication Sig    ALPRAZolam (XANAX) 0.5 mg Oral Tablet Take 1 Tablet (0.5 mg total) by mouth Every evening    amLODIPine (NORVASC) 5 mg Oral Tablet Take 1 Tablet (5 mg total) by mouth Once a day    bempedoic acid 180 mg Oral Tablet Take 1 Tablet (180 mg total) by mouth Once a day    Cholecalciferol, Vitamin D3, 10 mcg (400 unit) Oral Tablet, Chewable Chew Once a day    clopidogreL (PLAVIX) 75 mg Oral Tablet Take 1 Tablet (75 mg total) by mouth Once a day    empagliflozin (JARDIANCE) 10 mg Oral Tablet Take 1 Tablet (10 mg total) by mouth Once a day    evolocumab (REPATHA SURECLICK) 140 mg/mL Subcutaneous Pen Injector Inject 1 pen (140 mg) subcutaneously (under the skin) once every 14 days    ezetimibe (ZETIA) 10 mg Oral Tablet TAKE 1 TABLET (10 MG TOTAL) BY MOUTH EVERY EVENING    fluticasone propionate (FLONASE NASL) by Nasal route    Irbesartan-Hydrochlorothiazide 300-12.5 mg Oral Tablet Take 300 mg by mouth Once a day    Isosorbide Mononitrate (IMDUR) 120 mg Oral Tablet Sustained Release 24 hr Take 1 Tablet (120 mg total) by mouth Every morning    levothyroxine (SYNTHROID) 112 mcg Oral Tablet Take 1 Tablet (112 mcg total) by mouth Every morning    metoprolol tartrate (LOPRESSOR) 25 mg Oral  Tablet Take 0.5 Tablets (12.5 mg total) by mouth Twice daily    nitroGLYCERIN (NITROSTAT) 0.4 mg Sublingual Tablet, Sublingual PLACE 1 TABLET UNDER TONGUE EVERY 5 MINS, UP TO 3 DOSES AS NEEDED FOR CHEST PAIN    pantoprazole (PROTONIX) 40 mg Oral Tablet, Delayed Release (E.C.) Take 1 Tablet (40 mg total) by mouth Once a day    POTASSIUM-99 ORAL Take by mouth Once a day    ranolazine (RANEXA) 500 mg Oral Tablet Sustained Release 12 hr Take 1 Tablet (500 mg total) by mouth Twice daily    valACYclovir (VALTREX) 500 mg Oral Tablet Take 1 Tablet (500 mg total) by mouth Twice daily    vitamin B complex (B COMPLEX-VITAMIN B12 ORAL) Take by mouth Once a day     Allergies   Allergen Reactions    Pneumococcal 23-Valent Polysaccharide  Vaccine  Other Adverse Reaction (Add comment)     Severe local swelling    Tramadol Nausea/ Vomiting     "makes me sick with or without food"    Iohexol  Other Adverse Reaction (Add comment) and Hives/ Urticaria      Desc: HIVES SEVERAL HRS S/P IV CONTRAST.. [place] LAST SCAN W/ BENADRYL PREMED @ [facility] '01/AC.    Lisinopril  Other Adverse Reaction (Add comment)     Other reaction(s): Cough    Other Rash     Other reaction(s): Other (See Comments)  Nickle   When teeth were wired in she place, patient obtained an infection and wiring had to be removed,  Also allergic to silver    Crestor [Rosuvastatin]      Muscle aches    Lovastatin     Statin [Atorvastatin]      Muscle ache    Iv Contrast Hives/ Urticaria    Nickel Hives/ Urticaria     Past Medical History:   Diagnosis Date    Cancer (CMS HCC)     skin cancer -- forehead  2024    Coronary artery disease     CVA (cerebral vascular accident) (CMS HCC) 2009    Dyspnea on exertion     Essential hypertension     Hyperlipidemia     PONV (postoperative nausea and vomiting)     Skin cancer     Stented coronary artery     Type 2 diabetes mellitus (CMS HCC)          Past Surgical History:   Procedure Laterality Date    CARDIAC CATHETERIZATION      CATARACT EXTRACTION, BILATERAL Bilateral     HX COLONOSCOPY      HX HEART CATHETERIZATION  2017 and 2018    Stented twice  prox circ  and circ    HX PARTIAL HYSTERECTOMY      tubes and ovaries left intact    HX THYROIDECTOMY  1978    HX WISDOM TEETH EXTRACTION      KNEE SURGERY  2018    muscle removed    LAPAROSCOPIC CHOLECYSTECTOMY      RECONSTRUCTION OF NOSE  1980    SKIN CANCER EXCISION      left side forehead         Family Medical History:       Problem Relation (Age of Onset)    Cardiomyopathy Father    Heart Attack Mother    Heart Disease Sister    Uterine Cancer Mother, Niece            Social History     Tobacco  Use    Smoking status: Former     Current packs/day: 0.50     Average packs/day: 0.5 packs/day for 10.0 years (5.0 ttl  pk-yrs)     Types: Cigarettes    Smokeless tobacco: Never   Vaping Use    Vaping status: Never Used   Substance Use Topics    Alcohol use: Not Currently     Alcohol/week: 1.0 standard drink of alcohol     Types: 1 Cans of beer per week     Comment: drank long time ago not current.    Drug use: Never       Review of Systems   Constitutional:  Negative for activity change, chills, diaphoresis, fatigue and unexpected weight change.   Respiratory:  Negative for apnea, cough, chest tightness, shortness of breath and wheezing.    Cardiovascular:  Negative for chest pain, palpitations and leg swelling.   Gastrointestinal:  Negative for blood in stool.   Genitourinary:  Negative for hematuria.   Musculoskeletal:  Negative for arthralgias and myalgias.   Skin:  Negative for color change.   Neurological:  Negative for dizziness, syncope, weakness and light-headedness.   Hematological:  Does not bruise/bleed easily.        Objective   Objective:   Vitals: BP 139/65 (Site: Right Arm, Patient Position: Sitting)   Pulse 76   Ht 1.588 m (5' 2.5")   Wt 71.3 kg (157 lb 3.2 oz)   SpO2 99% Comment: ra  BMI 28.29 kg/m         Physical Exam  Vitals and nursing note reviewed.   Constitutional:       General: She is not in acute distress.  HENT:      Head: Normocephalic and atraumatic.   Eyes:      General: No scleral icterus.  Cardiovascular:      Rate and Rhythm: Normal rate and regular rhythm.      Pulses: Normal pulses.      Heart sounds: No murmur heard.     No friction rub. No gallop.   Pulmonary:      Effort: Pulmonary effort is normal. No respiratory distress.      Breath sounds: Normal breath sounds. No wheezing, rhonchi or rales.   Musculoskeletal:      Cervical back: Normal range of motion and neck supple.      Right lower leg: No edema.      Left lower leg: No edema.   Skin:     General: Skin is warm and dry.      Findings: No rash.   Neurological:      Mental Status: She is alert and oriented to person, place, and time.    Psychiatric:         Behavior: Behavior normal.          Assessment & Plan:     ENCOUNTER DIAGNOSES     ICD-10-CM   1. CAD in native artery  I25.10   2. S/P angioplasty with stent  Z95.820   3. Essential hypertension  I10   4. HLD (hyperlipidemia)  E78.5     [name] [name] reports overall she is doing well and voices no cardiac complaints. She states since she was able to straighten out her thyroid, she has done well and has not had any symptoms. BP typically well controlled. Will plan to continue her current medication therapy at this time. She will return to the office for a follow up visit in 6  months, sooner if any new issues should arise. She will call with any questions or concerns.       No orders of the defined types were placed in this encounter.    This patient was seen independently with supervising physician available for consultation.    On the day of the encounter, a total of 30 minutes was spent on this patient encounter including review of historical information, examination, documentation and post-visit activities. The time documented excludes procedural time.       Return in about 6 months (around 03/24/2024), or if symptoms worsen or fail to improve.    Curlene Labrum, NP 09/24/2023 12:52

## 2023-09-25 ENCOUNTER — Encounter (INDEPENDENT_AMBULATORY_CARE_PROVIDER_SITE_OTHER): Payer: Self-pay | Admitting: NURSE PRACTITIONER, FAMILY

## 2023-09-26 ENCOUNTER — Encounter (INDEPENDENT_AMBULATORY_CARE_PROVIDER_SITE_OTHER): Payer: Medicare PPO | Admitting: Cardiovascular Disease

## 2023-09-26 ENCOUNTER — Other Ambulatory Visit (INDEPENDENT_AMBULATORY_CARE_PROVIDER_SITE_OTHER): Payer: Self-pay | Admitting: Cardiovascular Disease

## 2023-09-26 DIAGNOSIS — R55 Syncope and collapse: Secondary | ICD-10-CM

## 2023-09-26 DIAGNOSIS — Z95818 Presence of other cardiac implants and grafts: Secondary | ICD-10-CM

## 2023-10-09 ENCOUNTER — Other Ambulatory Visit (HOSPITAL_COMMUNITY): Payer: Self-pay | Admitting: Medical

## 2023-10-10 ENCOUNTER — Other Ambulatory Visit (HOSPITAL_COMMUNITY): Payer: Self-pay | Admitting: NURSE PRACTITIONER, FAMILY

## 2023-10-10 ENCOUNTER — Other Ambulatory Visit: Payer: Self-pay

## 2023-10-10 MED ORDER — REPATHA SURECLICK 140 MG/ML SUBCUTANEOUS PEN INJECTOR
PEN_INJECTOR | SUBCUTANEOUS | 12 refills | Status: DC
  Filled 2023-10-14 – 2023-10-21 (×2): qty 6, 84d supply, fill #0
  Filled 2024-01-20 – 2024-01-28 (×2): qty 6, 84d supply, fill #1
  Filled 2024-04-24: qty 6, 84d supply, fill #2
  Filled 2024-07-27 – 2024-08-10 (×3): qty 6, 84d supply, fill #3
  Filled 2024-11-01: qty 6, 84d supply, fill #4

## 2023-10-14 ENCOUNTER — Other Ambulatory Visit: Payer: Self-pay

## 2023-10-15 ENCOUNTER — Other Ambulatory Visit: Payer: Self-pay

## 2023-10-16 ENCOUNTER — Other Ambulatory Visit: Payer: Self-pay

## 2023-10-21 ENCOUNTER — Other Ambulatory Visit: Payer: Self-pay

## 2023-10-23 ENCOUNTER — Other Ambulatory Visit: Payer: Self-pay

## 2023-10-25 ENCOUNTER — Other Ambulatory Visit: Payer: Self-pay

## 2023-10-25 ENCOUNTER — Other Ambulatory Visit (INDEPENDENT_AMBULATORY_CARE_PROVIDER_SITE_OTHER): Payer: Self-pay | Admitting: Cardiovascular Disease

## 2023-10-27 ENCOUNTER — Encounter (INDEPENDENT_AMBULATORY_CARE_PROVIDER_SITE_OTHER): Payer: Medicare PPO | Admitting: Cardiovascular Disease

## 2023-10-27 DIAGNOSIS — R55 Syncope and collapse: Secondary | ICD-10-CM

## 2023-10-27 DIAGNOSIS — Z95818 Presence of other cardiac implants and grafts: Secondary | ICD-10-CM

## 2023-11-18 ENCOUNTER — Encounter (HOSPITAL_BASED_OUTPATIENT_CLINIC_OR_DEPARTMENT_OTHER): Payer: Self-pay | Admitting: Medical

## 2023-11-18 ENCOUNTER — Other Ambulatory Visit: Payer: Self-pay

## 2023-11-18 ENCOUNTER — Ambulatory Visit: Payer: Medicare PPO | Attending: Medical | Admitting: Medical

## 2023-11-18 VITALS — Ht 62.0 in | Wt 158.1 lb

## 2023-11-18 DIAGNOSIS — M1711 Unilateral primary osteoarthritis, right knee: Secondary | ICD-10-CM | POA: Insufficient documentation

## 2023-11-18 MED ORDER — TRIAMCINOLONE ACETONIDE ER 32 MG INTRA-ARTICULAR SUSPENSION EXT.RELEAS
32.0000 mg | Freq: Once | INTRA_ARTICULAR | Status: AC
Start: 2023-11-18 — End: 2023-11-18
  Administered 2023-11-18: 32 mg via INTRA_ARTICULAR

## 2023-11-18 NOTE — Progress Notes (Signed)
Orthopaedics & Sports Medicine  [address]  [place], [place] [address]  [phone]       OFFICE VISIT    PATIENT NAME:  [name] [name]  MEDICAL RECORD NUMBER: [id number]    DICTATING PHYSICIAN:  Launa Flight, PA-C  REFERRING PHYSICIAN:  Marko Stai, PA-C    DOB:  June 16, 1941  DOS:  11/18/2023    CHIEF COMPLAINT:  right Knee Pain      HISTORY OF PRESENT ILLNESS:  Blakelynn Sekelsky is a 82 y.o. female.  Comes to the office today for follow up consultation in regards to ongoing right knee pain.  Patient states that this has been ongoing for many years.  she cannot recall one specific injury that caused the pain.  Patient tells me that their symptoms are worse with weight bearing activities and ambulating on uneven ground and downward grades.  she has pain that awakens them from sleeping. In addition to modifying the activities that create pain, she has attempted NSAIDs, tylenol, steroid injections, Visco injections, PRP injections, Zilretta in the right knee.  She has multiple health issues including cardiac problems, h/o stroke.  At this point the patient is here today to discuss further evaluation of their knee pain.     PAST MEDICAL HISTORY:  Past Medical History:   Diagnosis Date    Cancer (CMS [facility])     skin cancer -- forehead  2024    Coronary artery disease     CVA (cerebral vascular accident) (CMS HCC) 2009    Dyspnea on exertion     Essential hypertension     Hyperlipidemia     PONV (postoperative nausea and vomiting)     Skin cancer     Stented coronary artery     Type 2 diabetes mellitus (CMS HCC)            PAST SURGICAL HISTORY:  Past Surgical History:   Procedure Laterality Date    CARDIAC CATHETERIZATION      CATARACT EXTRACTION, BILATERAL Bilateral     HX COLONOSCOPY      HX HEART CATHETERIZATION  2017 and 2018    Stented twice  prox circ  and circ    HX PARTIAL HYSTERECTOMY      tubes and ovaries left intact    HX THYROIDECTOMY  1978    HX WISDOM TEETH EXTRACTION      KNEE SURGERY  2018    muscle removed     LAPAROSCOPIC CHOLECYSTECTOMY      RECONSTRUCTION OF NOSE  1980    SKIN CANCER EXCISION      left side forehead           MEDICATIONS:  Current Outpatient Medications   Medication Sig    ALPRAZolam (XANAX) 0.5 mg Oral Tablet Take 1 Tablet (0.5 mg total) by mouth Every evening    amLODIPine (NORVASC) 5 mg Oral Tablet Take 1 Tablet (5 mg total) by mouth Once a day    bempedoic acid 180 mg Oral Tablet Take 1 Tablet (180 mg total) by mouth Once a day    Cholecalciferol, Vitamin D3, 10 mcg (400 unit) Oral Tablet, Chewable Chew Once a day    clopidogreL (PLAVIX) 75 mg Oral Tablet Take 1 Tablet (75 mg total) by mouth Once a day    empagliflozin (JARDIANCE) 10 mg Oral Tablet Take 1 Tablet (10 mg total) by mouth Once a day    evolocumab (REPATHA SURECLICK) 140 mg/mL Subcutaneous Pen Injector Inject 1 pen (140 mg) subcutaneously (under  the skin) once every 14 days    ezetimibe (ZETIA) 10 mg Oral Tablet TAKE 1 TABLET BY MOUTH EVERY DAY IN THE EVENING    fluticasone propionate (FLONASE NASL) by Nasal route    Irbesartan-Hydrochlorothiazide 300-12.5 mg Oral Tablet Take 300 mg by mouth Once a day    Isosorbide Mononitrate (IMDUR) 120 mg Oral Tablet Sustained Release 24 hr Take 1 Tablet (120 mg total) by mouth Every morning    levothyroxine (SYNTHROID) 112 mcg Oral Tablet Take 1 Tablet (112 mcg total) by mouth Every morning    metoprolol tartrate (LOPRESSOR) 25 mg Oral Tablet Take 0.5 Tablets (12.5 mg total) by mouth Twice daily    nitroGLYCERIN (NITROSTAT) 0.4 mg Sublingual Tablet, Sublingual PLACE 1 TABLET UNDER TONGUE EVERY 5 MINS, UP TO 3 DOSES AS NEEDED FOR CHEST PAIN    pantoprazole (PROTONIX) 40 mg Oral Tablet, Delayed Release (E.C.) Take 1 Tablet (40 mg total) by mouth Once a day    POTASSIUM-99 ORAL Take by mouth Once a day    ranolazine (RANEXA) 500 mg Oral Tablet Sustained Release 12 hr Take 1 Tablet (500 mg total) by mouth Twice daily    valACYclovir (VALTREX) 500 mg Oral Tablet Take 1 Tablet (500 mg total) by mouth  Twice daily    vitamin B complex (B COMPLEX-VITAMIN B12 ORAL) Take by mouth Once a day       ALLERGIES:  Allergy History as of 11/18/23       IV CONTRAST         Noted Status Severity Type Reaction    08/28/19 1339 , Larned, LPN 16/10/96 Active Low  Hives/ Urticaria              NICKEL         Noted Status Severity Type Reaction    08/28/19 1339 Calvin, [place], LPN 04/54/09 Active Low  Hives/ Urticaria              ATORVASTATIN         Noted Status Severity Type Reaction    05/24/21 1302 Calain, Amy, RN 05/24/21 Active       Comments: Muscle ache               ROSUVASTATIN         Noted Status Severity Type Reaction    05/24/21 1303 Calain, Amy, RN 05/24/21 Active       Comments: Muscle aches               LOVASTATIN         Noted Status Severity Type Reaction    05/24/21 1303 Lorane Gell, RN 05/24/21 Active                 IOHEXOL         Noted Status Severity Type Reaction    01/18/22 0401 Judi Saa, RN 08/15/05 Active Medium Side Effect  Other Adverse Reaction (Add comment), Hives/ Urticaria    Comments:  Desc: HIVES SEVERAL HRS S/P IV CONTRAST.. [place] LAST SCAN W/ BENADRYL PREMED @ [facility] '01/AC.     07/26/21 1424 Flesher, Mitchel Honour, LPN 81/19/14 Active Medium  Hives/ Urticaria,  Other Adverse Reaction (Add comment)    Comments:  Desc: HIVES SEVERAL HRS S/P IV CONTRAST.. [place] LAST SCAN W/ BENADRYL PREMED @ [facility] '01/AC.               LISINOPRIL         Noted Status Severity Type Reaction  01/18/22 0402 Judi Saa, RN 05/25/14 Active Medium Side Effect  Other Adverse Reaction (Add comment)    Comments: Other reaction(s): Cough     07/26/21 1424 Maye Hides, LPN 16/10/96 Active Medium      Comments: Other reaction(s): Cough               PNEUMOCOCCAL 23-VALENT POLYSACCHARIDE VACCINE         Noted Status Severity Type Reaction    01/18/22 0401 Judi Saa, RN 05/25/14 Active High Systemic  Other Adverse Reaction (Add comment)    Comments: Severe local swelling     07/26/21 1424 Maye Hides, LPN  04/54/09 Active High   Other Adverse Reaction (Add comment)    Comments: Severe local swelling               TRAMADOL         Noted Status Severity Type Reaction    01/18/22 0401 Judi Saa, RN 10/21/18 Active High Side Effect Nausea/ Vomiting    Comments: "makes me sick with or without food"     07/26/21 1424 Maye Hides, LPN 81/19/14 Active High  Nausea/ Vomiting    Comments: "makes me sick with or without food"               OTHER         Noted Status Severity Type Reaction    01/18/22 0401 Judi Saa, RN 01/12/16 Active Medium Side Effect Rash    Comments: Other reaction(s): Other (See Comments)  Nickle   When teeth were wired in she place, patient obtained an infection and wiring had to be removed,  Also allergic to silver     07/26/21 1425 Maye Hides, LPN 78/29/56 Active Medium  Rash    Comments: Other reaction(s): Other (See Comments)  Nickle   When teeth were wired in she place, patient obtained an infection and wiring had to be removed,  Also allergic to silver                     FAMILY HISTORY:  Family Medical History:       Problem Relation (Age of Onset)    Cardiomyopathy Father    Heart Attack Mother    Heart Disease Sister    Uterine Cancer Mother, Niece                SOCIAL HISTORY:  Social History     Socioeconomic History    Marital status: Married     Spouse name: Not on file    Number of children: Not on file    Years of education: Not on file    Highest education level: Not on file   Occupational History    Not on file   Tobacco Use    Smoking status: Former     Current packs/day: 0.50     Average packs/day: 0.5 packs/day for 10.0 years (5.0 ttl pk-yrs)     Types: Cigarettes    Smokeless tobacco: Never   Vaping Use    Vaping status: Never Used   Substance and Sexual Activity    Alcohol use: Not Currently     Alcohol/week: 1.0 standard drink of alcohol     Types: 1 Cans of beer per week     Comment: drank long time ago not current.    Drug use: Never    Sexual activity: Not on  file   Other Topics Concern    Ability to  Walk 1 Flight of Steps without SOB/CP No    Routine Exercise Not Asked    Ability to Walk 2 Flight of Steps without SOB/CP Not Asked    Unable to Ambulate Not Asked    Total Care Not Asked    Ability To Do Own ADL's Yes    Uses Walker Not Asked    Other Activity Level Not Asked    Uses Cane Not Asked   Social History Narrative    Not on file     Social Determinants of Health     Financial Resource Strain: Not on file   Transportation Needs: Not on file   Social Connections: Not on file   Intimate Partner Violence: Not on file   Housing Stability: Not on file       REVIEW OF SYSTEMS  Constitutional: negative  Musculoskeletal:positive for Right knee pain  All other ROS Negative    PHYSICAL EXAMINATION:    Ht 1.575 m (5\' 2" )   Wt 71.7 kg (158 lb 1.1 oz)   BMI 28.91 kg/m         GENERAL: she is alert, cooperative, no distress, appears stated age.      ORTHOPEDIC EVALUATION:    Patient displays an abnormal gait.  Patient favors the rightlower extremity.  Gross inspection reveals normal mechanical and anatomic axis of the lower extremities with no appreciable leg length discrepancy.  No tenderness found with AP or lateral compression testing of the pelvis.    Right hip shows full PROM (Forward flexion 0-115 degrees, internal rotation 0-35 degrees, external rotation 0-45 degrees). McCarthy's Maneuvers are negative for impingement. FABRE testing negative. No tenderness to palpation over the greater trochanter.  Straight leg testing is negative for radiculopathy.  Compartments are supple.  Skin is clear.  +5/5 hip flexors/extenders, as well as, knee flexors and extenders.  Intact sensory exam along all dermatomes.    Left hip shows full PROM (Forward flexion 0-115 degrees, internal rotation 0-35 degrees, external rotation 0-45 degrees). McCarthy's Maneuvers are negative for impingement. FABRE testing negative. No tenderness to palpation over the greater trochanter.  Straight leg  testing is negative for radiculopathy.  Compartments are supple.  Skin is clear.  +5/5 hip flexors/extenders, as well as, knee flexors and extenders.  Intact sensory exam along all dermatomes.    Right knee shows full PROM (0-120 degrees).  Patella tracks midline without instability.  Varus deformity.  Moderate tenderness to palpation of the distal femur and proximal tibia. Clark's patella-femoral test is positivefor crepitus and pain.  No effusion is present.  Lachman's test is negative.  Posterior Drawer test is negative.  Collateral ligamental testing is stable at 30 degrees of extension, mid-flexion, and terminal flexion.  McMurrary testing is negative.  No Baker's cyst is present.  Skin is clear.  Normoreflexic.   Left knee shows full PROM (0-120 degrees).  Patella tracks midline without instability.  Varus deformity.  Mild tenderness to palpation of the distal femur and proximal tibia. Clark's patella-femoral test is positivefor crepitus and pain.  No effusion is present.  Lachman's test is negative.  Posterior Drawer test is negative.  Collateral ligamental testing is stable at 30 degrees of extension, mid-flexion, and terminal flexion.  McMurrary testing is negative.  No Baker's cyst is present.  Skin is clear.  Normoreflexic.    Right Ankle shows full PROM in plantar and dorsiflexion.  Stable to ligamental testing.  Skin is clear. +5/5 motor strength to plantar/dorsiflexion, inversion/eversion, and EHL.  Intact sensory to light-touch.  +2/4 dorsalis pedis and posterior tibial pulse.    Left Ankle shows full PROM in plantar and dorsiflexion.  Stable to ligamental testing.  Skin is clear. +5/5 motor strength to plantar/dorsiflexion, inversion/eversion, and EHL.  Intact sensory to light-touch.  +2/4 dorsalis pedis and posterior tibial pulse.          IMAGING:    No new imaging             IMPRESSION:      ICD-10-CM    1. Primary osteoarthritis of right knee  M17.11 Zilretta Joint Injection Schedule/Auth           PLAN:        At this point I have discussed with the patient today my evaluation and recommended treatment alternatives.  We have discussed the pros and cons of both conservative, as well as, surgical intervention.After discussion we are going to continue with conservative management. I have reviewed alternative to exercise including aquatic therapy.  She is a high risk for surgical complications with her overall health issues. 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  She would like to continue with non-operative management.  We discussed pharmacologic treatment with tylenol.  In addition, injection management will be initiated with Zilretta.  All questions were answered to their completeness.  We will continue to monitor their symptoms as an outpatient.       PROCEDURE:   I reviewed with the patient today the risks, benefits, rationale, expected outcomes and potential complications of undergoing injection management.  Consent has been obtained and a signed copy has been placed on the chart.  Patient identified their right knee was to be injected.   The patient was positioned with their knees dangling from a seated position.  The anterior lateral portal of the right knee was prepped using sterile technique.  A 1 unit vial of  Zilretta was injected  into the right knee.  The patient tolerated this without complaints.          This note was partially generated using MModal Fluency Direct system, and there may be some incorrect words, spellings, and punctuation that were not noted in checking the note before saving.    I am scribing for, and in the presence of, Launa Flight, MMS, PA-C for services provided on 11/18/2023.  Darlen Round, MA, SCRIBE      I personally performed the services described in this documentation, as scribed in my presence, and it is both accurate and complete.  I have reviewed the note and made changes where needed.  I  agree with the plan as above.    Launa Flight, PA-C 11/18/2023      [facility] - Orthopaedics & Sports Medicine  [facility]

## 2023-11-27 ENCOUNTER — Encounter (INDEPENDENT_AMBULATORY_CARE_PROVIDER_SITE_OTHER): Payer: Medicare PPO | Admitting: Cardiovascular Disease

## 2023-11-27 DIAGNOSIS — Z95818 Presence of other cardiac implants and grafts: Secondary | ICD-10-CM

## 2023-11-27 DIAGNOSIS — R55 Syncope and collapse: Secondary | ICD-10-CM

## 2023-12-01 ENCOUNTER — Other Ambulatory Visit (INDEPENDENT_AMBULATORY_CARE_PROVIDER_SITE_OTHER): Payer: Self-pay | Admitting: Cardiovascular Disease

## 2023-12-25 ENCOUNTER — Ambulatory Visit (INDEPENDENT_AMBULATORY_CARE_PROVIDER_SITE_OTHER): Payer: Self-pay | Admitting: OBSTETRICS/GYNECOLOGY

## 2023-12-27 ENCOUNTER — Other Ambulatory Visit (INDEPENDENT_AMBULATORY_CARE_PROVIDER_SITE_OTHER): Payer: Self-pay | Admitting: Cardiovascular Disease

## 2023-12-28 ENCOUNTER — Encounter (INDEPENDENT_AMBULATORY_CARE_PROVIDER_SITE_OTHER): Payer: Medicare PPO | Admitting: Cardiovascular Disease

## 2023-12-28 DIAGNOSIS — R55 Syncope and collapse: Secondary | ICD-10-CM

## 2023-12-28 DIAGNOSIS — Z95818 Presence of other cardiac implants and grafts: Secondary | ICD-10-CM

## 2024-01-03 ENCOUNTER — Other Ambulatory Visit: Payer: Self-pay

## 2024-01-07 ENCOUNTER — Other Ambulatory Visit: Payer: Self-pay

## 2024-01-07 ENCOUNTER — Ambulatory Visit (INDEPENDENT_AMBULATORY_CARE_PROVIDER_SITE_OTHER): Payer: Medicare PPO | Admitting: Physician Assistant

## 2024-01-07 ENCOUNTER — Encounter (INDEPENDENT_AMBULATORY_CARE_PROVIDER_SITE_OTHER): Payer: Self-pay | Admitting: Physician Assistant

## 2024-01-07 VITALS — BP 142/89 | HR 71 | Resp 14 | Ht 62.0 in | Wt 156.0 lb

## 2024-01-07 DIAGNOSIS — R9431 Abnormal electrocardiogram [ECG] [EKG]: Secondary | ICD-10-CM

## 2024-01-07 DIAGNOSIS — I2510 Atherosclerotic heart disease of native coronary artery without angina pectoris: Secondary | ICD-10-CM

## 2024-01-07 DIAGNOSIS — I10 Essential (primary) hypertension: Secondary | ICD-10-CM

## 2024-01-07 DIAGNOSIS — Z95820 Peripheral vascular angioplasty status with implants and grafts: Secondary | ICD-10-CM

## 2024-01-07 DIAGNOSIS — R0602 Shortness of breath: Secondary | ICD-10-CM

## 2024-01-07 DIAGNOSIS — E785 Hyperlipidemia, unspecified: Secondary | ICD-10-CM

## 2024-01-07 NOTE — Nursing Note (Signed)
Meds checked with patients list and confirmed correct pharmacy with patient for e-scribing.   Rutherford Guys, Ambulatory Care Assistant 01/07/2024 11:28 AM

## 2024-01-07 NOTE — Progress Notes (Signed)
CARDIOLOGY [facility] MEDICAL OFFICE BUILDING  [address]  [place] [address]    Progress Note    Encounter Date: 01/07/2024    Patient ID:  [name] [name]  [id number]    DOB: Jul 29, 1941  Age: 83 y.o. female  Subjective   Subjective:     Chief Complaint   Patient presents with    Numbness/Tingling In Arm    Numbness/paresthesia     On L side and states that her head feels funny     Dizziness    Fatigue     HPI  [name] [name] is a pleasant 83 y.o. lady presenting for routine follow up and has complaints of exertional dyspnea, chest pain and heaviness, left arm pain and numbness that's waxing and waning, fatigue, dizziness, and numbness/tingling on the left side. She has a PMH of CAD s/p PCI to the LCx x 2 and more recently IVUS guided PCI on 01/19/2022 to the mid LCx x1 that was overlapped proximally with previously placed stent for unstable angina, HTN, HLD and DMT2. A TTE 01/20/2022 showed preserved EF at 68.5% and no significant valve disease. She used to follow with Dr. Dorita Fray for lipid management and remains on Repatha 140 mg every two weeks and Zetia 10 mg once daily. In April of 2023, she had syncope while at church. 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 She's following with vascular surgery and is a former patient of Dr. Vivien Rota and now sees Dr. Deatra James with her last visit in 07/2023. A CTA in August of 2023 read as severe left carotid artery stenosis, however on independent review of the images, there was less than 50% bilateral stenosis. Subsequent ultrasound imaging also demonstrated less than 50% stenosis. 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 A 14 day Holter monitor in 05/2022 showed sinus rhythm with no Afib, VT, heart block or pauses. Average heart rate was 65bpm. Her most recent transmission from her loop recorder showed bradycardic  readings, however her monitor is undersensing, and likely those are inaccurate readings.     Today, she states she had really bad days on Wednesday and Thursday with chest heaviness and pains around her left breast area with radiation into her left arm. She admits she put her husbands O2 on and did not seek medical attention. She took 4 baby aspirins but no SL NTG. She states symptom duration varies but can last up to 30-45 minutes. The following day, she took her husband to the clinic to see his PCP, and they did an EKG on her and suggested ER evaluation for continued symptoms and follow up with [place]. She admits to continued numbness, "head feeling funny on her left side", left sided weakness, left arm numbness and pain, and intermittent chest discomfort. She's also had dyspnea with walking with improvement with rest. No diaphoresis, nausea or vomiting.    Current Outpatient Medications   Medication Sig    ALPRAZolam (XANAX) 0.5 mg Oral Tablet Take 1 Tablet (0.5 mg total) by mouth Every evening    amLODIPine (NORVASC) 5 mg Oral Tablet Take 1 Tablet (5 mg total) by mouth Once a day    bempedoic acid 180 mg Oral Tablet Take 1 Tablet (180 mg total) by mouth Once a day    Cholecalciferol, Vitamin D3, 10 mcg (400 unit) Oral Tablet, Chewable Chew Once a  day    clopidogreL (PLAVIX) 75 mg Oral Tablet Take 1 Tablet (75 mg total) by mouth Once a day    empagliflozin (JARDIANCE) 10 mg Oral Tablet Take 1 Tablet (10 mg total) by mouth Once a day    evolocumab (REPATHA SURECLICK) 140 mg/mL Subcutaneous Pen Injector Inject 1 pen (140 mg) subcutaneously (under the skin) once every 14 days    ezetimibe (ZETIA) 10 mg Oral Tablet TAKE 1 TABLET BY MOUTH EVERY DAY IN THE EVENING    fluticasone propionate (FLONASE NASL) by Nasal route    Irbesartan-Hydrochlorothiazide 300-12.5 mg Oral Tablet Take 300 mg by mouth Once a day    Isosorbide Mononitrate (IMDUR) 120 mg Oral Tablet Sustained Release 24 hr Take 1 Tablet (120 mg total) by mouth  Every morning    levothyroxine (SYNTHROID) 112 mcg Oral Tablet Take 1 Tablet (112 mcg total) by mouth Every morning    metoprolol tartrate (LOPRESSOR) 25 mg Oral Tablet Take 0.5 Tablets (12.5 mg total) by mouth Twice daily    nitroGLYCERIN (NITROSTAT) 0.4 mg Sublingual Tablet, Sublingual PLACE 1 TABLET UNDER TONGUE EVERY 5 MINS, UP TO 3 DOSES AS NEEDED FOR CHEST PAIN    pantoprazole (PROTONIX) 40 mg Oral Tablet, Delayed Release (E.C.) Take 1 Tablet (40 mg total) by mouth Once a day    POTASSIUM-99 ORAL Take by mouth Once a day    ranolazine (RANEXA) 500 mg Oral Tablet Sustained Release 12 hr Take 1 Tablet (500 mg total) by mouth Twice daily    valACYclovir (VALTREX) 500 mg Oral Tablet Take 1 Tablet (500 mg total) by mouth Twice daily    vitamin B complex (B COMPLEX-VITAMIN B12 ORAL) Take by mouth Once a day     Allergies   Allergen Reactions    Pneumococcal 23-Valent Polysaccharide Vaccine  Other Adverse Reaction (Add comment)     Severe local swelling    Tramadol Nausea/ Vomiting     "makes me sick with or without food"    Iohexol  Other Adverse Reaction (Add comment) and Hives/ Urticaria      Desc: HIVES SEVERAL HRS S/P IV CONTRAST.. OK LAST SCAN W/ BENADRYL PREMED @ [facility] '01/AC.    Lisinopril  Other Adverse Reaction (Add comment)     Other reaction(s): Cough    Other Rash     Other reaction(s): Other (See Comments)  Nickle   When teeth were wired in she place, patient obtained an infection and wiring had to be removed,  Also allergic to silver    Crestor [Rosuvastatin]      Muscle aches    Lovastatin     Statin [Atorvastatin]      Muscle ache    Iv Contrast Hives/ Urticaria    Nickel Hives/ Urticaria     Past Medical History:   Diagnosis Date    Cancer (CMS [facility])     skin cancer -- forehead  2024    Coronary artery disease     CVA (cerebral vascular accident) (CMS HCC) 2009    Dyspnea on exertion     Essential hypertension     Hyperlipidemia     PONV (postoperative nausea and vomiting)     Skin cancer     Stented  coronary artery     Type 2 diabetes mellitus (CMS HCC)          Past Surgical History:   Procedure Laterality Date    CARDIAC CATHETERIZATION      CATARACT EXTRACTION, BILATERAL Bilateral  HX COLONOSCOPY      HX HEART CATHETERIZATION  2017 and 2018    Stented twice  prox circ  and circ    HX PARTIAL HYSTERECTOMY      tubes and ovaries left intact    HX THYROIDECTOMY  1978    HX WISDOM TEETH EXTRACTION      KNEE SURGERY  2018    muscle removed    LAPAROSCOPIC CHOLECYSTECTOMY      RECONSTRUCTION OF NOSE  1980    SKIN CANCER EXCISION      left side forehead         Family Medical History:       Problem Relation (Age of Onset)    Cardiomyopathy Father    Heart Attack Mother    Heart Disease Sister    Uterine Cancer Mother, Niece            Social History     Tobacco Use    Smoking status: Former     Current packs/day: 0.50     Average packs/day: 0.5 packs/day for 10.0 years (5.0 ttl pk-yrs)     Types: Cigarettes    Smokeless tobacco: Never   Vaping Use    Vaping status: Never Used   Substance Use Topics    Alcohol use: Not Currently     Alcohol/week: 1.0 standard drink of alcohol     Types: 1 Cans of beer per week     Comment: drank long time ago not current.    Drug use: Never       Review of Systems   All other systems reviewed and are negative.       Objective   Objective:   Vitals: BP (!) 142/89 (Site: Right Arm, Patient Position: Sitting)   Pulse 71   Resp 14   Ht 1.575 m (5\' 2" )   Wt 70.8 kg (156 lb)   SpO2 100%   BMI 28.53 kg/m         Physical Exam  Vitals and nursing note reviewed.   Constitutional:       Appearance: She is well-developed.   Neck:      Vascular: No JVD.   Cardiovascular:      Rate and Rhythm: Normal rate and regular rhythm.      Pulses: Normal pulses.      Heart sounds: S1 normal and S2 normal. No murmur heard.     No friction rub. No gallop.   Pulmonary:      Effort: Pulmonary effort is normal. No respiratory distress.      Breath sounds: Normal breath sounds. No wheezing or rales.    Chest:      Chest wall: No tenderness.   Abdominal:      General: Bowel sounds are normal.      Palpations: Abdomen is soft.      Tenderness: There is no abdominal tenderness.   Musculoskeletal:         General: No tenderness. Normal range of motion.      Cervical back: Normal range of motion and neck supple.   Neurological:      Mental Status: She is alert and oriented to person, place, and time.   Psychiatric:         Behavior: Behavior normal.          Assessment & Plan:     ENCOUNTER DIAGNOSES     ICD-10-CM   1. CAD in native artery  I25.10   2. S/P  angioplasty with stent  Z95.820   3. Essential hypertension  I10   4. HLD (hyperlipidemia)  E78.5   5. SOB (shortness of breath)  R06.02       In summary, Ms. Behl presents for follow up and reports exertional dyspnea, chest pain and heaviness, left arm pain and numbness that's waxing and waning, fatigue, dizziness, and numbness/tingling on the left side. She states she had really bad days on Wednesday and Thursday last week with chest heaviness and pains around her left breast area with radiation into her left arm. She admits she put her husbands O2 on and did not seek medical attention. The following day, she took her husband to the clinic to see his PCP, and they did an EKG on her and suggested ER evaluation for continued symptoms and follow up with [place]. She admits to continued numbness, "head feeling funny on her left side", left sided weakness, left arm numbness and pain, and intermittent chest discomfort. She's also had dyspnea with walking. No diaphoresis, nausea or vomiting. She didn't take SL NTG. An EKG in the office showed normal sinus rhythm with no ischemic changes noted. Given established CAD and continuation of intermittent chest discomfort and arm pain, it was recommended that she be evaluated in the ER with cardiac enzymes trended. She's also been having left sided numbness and states her head feels funny. She denies visual changes consistent with  amaurosis fugax. No aphasia or drooping noted. No deviation of her tongue.    Will continue current medical therapy as prescribed. Recommended trending of cardiac enzymes and consideration for stress testing. She has established CAD with her most recent intervention in February 2023.       Orders Placed This Encounter    ECG 12 LEAD (IN CLINIC) SAME DAY     This patient was seen independently with supervising physician available for consultation.    On the day of the encounter, a total of 34 minutes was spent on this patient encounter including review of historical information, examination, documentation and post-visit activities. The time documented excludes procedural time.     Return on 03/24/2024 as scheduled, or sooner if symptoms worsen or fail to improve.    Ashok Norris, PA-C  01/07/2024, 11:36  [place] [facility], Section of Cardiology

## 2024-01-09 ENCOUNTER — Emergency Department
Admission: EM | Admit: 2024-01-09 | Discharge: 2024-01-09 | Disposition: A | Discharge status: Home / Self Care | Payer: Medicare PPO | Attending: Physician Assistant | Admitting: Physician Assistant

## 2024-01-09 ENCOUNTER — Encounter (HOSPITAL_COMMUNITY): Payer: Self-pay

## 2024-01-09 ENCOUNTER — Emergency Department (HOSPITAL_COMMUNITY): Payer: Medicare PPO | Admitting: Radiology

## 2024-01-09 ENCOUNTER — Emergency Department (HOSPITAL_COMMUNITY): Payer: Medicare PPO

## 2024-01-09 ENCOUNTER — Other Ambulatory Visit: Payer: Self-pay

## 2024-01-09 DIAGNOSIS — Z91041 Radiographic dye allergy status: Secondary | ICD-10-CM | POA: Insufficient documentation

## 2024-01-09 DIAGNOSIS — R079 Chest pain, unspecified: Secondary | ICD-10-CM | POA: Insufficient documentation

## 2024-01-09 DIAGNOSIS — R9431 Abnormal electrocardiogram [ECG] [EKG]: Secondary | ICD-10-CM | POA: Insufficient documentation

## 2024-01-09 DIAGNOSIS — R202 Paresthesia of skin: Secondary | ICD-10-CM | POA: Insufficient documentation

## 2024-01-09 LAB — BASIC METABOLIC PANEL
ANION GAP: 8 mmol/L (ref 4–13)
BUN/CREA RATIO: 19 (ref 6–22)
BUN: 25 mg/dL (ref 8–25)
CALCIUM: 9.4 mg/dL (ref 8.6–10.3)
CHLORIDE: 100 mmol/L (ref 96–111)
CO2 TOTAL: 22 mmol/L — ABNORMAL LOW (ref 23–31)
CREATININE: 1.31 mg/dL — ABNORMAL HIGH (ref 0.60–1.05)
ESTIMATED GFR - FEMALE: 40 mL/min/BSA — ABNORMAL LOW (ref >=60)
GLUCOSE: 97 mg/dL (ref 65–125)
POTASSIUM: 4.2 mmol/L (ref 3.5–5.1)
SODIUM: 130 mmol/L — ABNORMAL LOW (ref 136–145)

## 2024-01-09 LAB — ECG W INTERP (AMB USE ONLY)(MUSE,IN CLINIC)
Atrial Rate: 63 {beats}/min
Calculated P Axis: 65 degrees
Calculated R Axis: 49 degrees
Calculated T Axis: 70 degrees
PR Interval: 150 ms
QRS Duration: 94 ms
QT Interval: 450 ms
QTC Calculation: 460 ms
Ventricular rate: 63 {beats}/min

## 2024-01-09 LAB — CBC WITH DIFF
BASOPHIL #: <0.1 10*3/uL (ref <=0.20)
BASOPHIL %: 0.6 %
EOSINOPHIL #: 0.11 10*3/uL (ref <=0.50)
EOSINOPHIL %: 1.4 %
HCT: 37.8 % (ref 34.8–46.0)
HGB: 13 g/dL (ref 11.5–16.0)
IMMATURE GRANULOCYTE #: <0.1 10*3/uL (ref <0.10)
IMMATURE GRANULOCYTE %: 0.5 % (ref 0.0–1.0)
LYMPHOCYTE #: 2.52 10*3/uL (ref 1.00–4.80)
LYMPHOCYTE %: 31.4 %
MCH: 31.3 pg (ref 26.0–32.0)
MCHC: 34.4 g/dL (ref 31.0–35.5)
MCV: 91.1 fL (ref 78.0–100.0)
MONOCYTE #: 0.7 10*3/uL (ref 0.20–1.10)
MONOCYTE %: 8.7 %
MPV: 9.2 fL (ref 8.7–12.5)
NEUTROPHIL #: 4.6 10*3/uL (ref 1.50–7.70)
NEUTROPHIL %: 57.4 %
PLATELETS: 279 10*3/uL (ref 150–400)
RBC: 4.15 10*6/uL (ref 3.85–5.22)
RDW-CV: 12.8 % (ref 11.5–15.5)
WBC: 8 10*3/uL (ref 3.7–11.0)

## 2024-01-09 LAB — ECG 12 LEAD
Atrial Rate: 64 {beats}/min
Calculated P Axis: 65 degrees
Calculated R Axis: -4 degrees
Calculated T Axis: 57 degrees
PR Interval: 158 ms
QRS Duration: 92 ms
QT Interval: 436 ms
QTC Calculation: 449 ms
Ventricular rate: 64 {beats}/min

## 2024-01-09 LAB — PTT (PARTIAL THROMBOPLASTIN TIME): APTT: 29.1 s (ref 23.6–35.7)

## 2024-01-09 LAB — TROPONIN-I
TROPONIN-I HS: 3.1 ng/L (ref <=14.0)
TROPONIN-I HS: <2.7 ng/L (ref <=14.0)

## 2024-01-09 LAB — POC BLOOD GLUCOSE (RESULTS): GLUCOSE, POC: 95 mg/dL (ref 60–110)

## 2024-01-09 LAB — PT/INR
INR: 1.04 (ref 0.80–1.20)
PROTHROMBIN TIME: 11.4 s (ref 9.2–13.2)

## 2024-01-09 LAB — PHOSPHORUS: PHOSPHORUS: 2.6 mg/dL (ref 2.3–4.0)

## 2024-01-09 LAB — MAGNESIUM: MAGNESIUM: 2.1 mg/dL (ref 1.8–2.6)

## 2024-01-09 LAB — CREATINE KINASE (CK), TOTAL, SERUM OR PLASMA: CREATINE KINASE: 52 U/L (ref 25–190)

## 2024-01-09 LAB — B-TYPE NATRIURETIC PEPTIDE (BNP),PLASMA: BNP: 90 pg/mL (ref <=99)

## 2024-01-09 MED ORDER — ASPIRIN 81 MG CHEWABLE TABLET
324.0000 mg | CHEWABLE_TABLET | ORAL | Status: AC
Start: 2024-01-09 — End: 2024-01-09
  Administered 2024-01-09: 324 mg via ORAL
  Filled 2024-01-09: qty 4

## 2024-01-09 NOTE — ED Nurses Note (Signed)
Pt ambulatory to rest room and back. Second trop collected and sent to lab. Pt still c/o chest pain that she rates 4/10. Husband at the bedside. Cardiac and VS monitor in place.

## 2024-01-09 NOTE — ED Provider Notes (Signed)
Department of Emergency Medicine  HPI - 01/09/2024    Advanced Practice Provider: Barbie Banner, PA-C  Attending Physician: Dr. Conley Rolls    Chief Complaint: Chest pain, facial numbness    HPI  HPI obtained from:  Patient    This is an 83 year old female patient who presents with intermittent chest pains for one-week.  She reports they come and go, she has not found anything that exacerbates or alleviates the pain.  She states they do seem to appear when she is active sometimes.  She took 4 baby aspirin on Sunday, 4 days ago.  She has not taken any aspirin today.  Symptoms are associated with shortness breath.  She also reports left side of the face numbness and tingling for 7 days.  She reports no changes in those symptoms.  She does have a history of CAD with 3 since.  Last stent was placed 2 years ago.  She also has a history of a right-sided CVA.  Medical history in addition to CVA and CAD, is significant for hypertension, dyslipidemia, diabetes.  She reports she took Tylenol this morning for symptom relief, but did not take any aspirin.    History Limitations: None      Review of Systems  Review of Systems obtained from:  Patient  Constitutional: Denies fever, aches  Skin: Denies rashes, wounds, erythema  HENT: Denies congestion, sore throat, ear pain  Eyes: Denies vision changes, discharge  Cardio:  Positive for chest pain, denies palpitations  Respiratory: Denies cough, sputum, positive for SOB  GI:  Denies nausea, vomiting, diarrhea, abdominal pain  GU:  Denies dysuria, hematuria, frequency, urgency  MSK:  Denies pain, swelling  Neuro:  Positive for loss of sensation in his left side of the face, denies weakness, headaches      History:   PMH:    Past Medical History:   Diagnosis Date    Cancer (CMS HCC)     skin cancer -- forehead  2024    Coronary artery disease     CVA (cerebral vascular accident) (CMS HCC) 2009    Dyspnea on exertion     Essential hypertension     Hyperlipidemia     PONV (postoperative  nausea and vomiting)     Skin cancer     Stented coronary artery     Type 2 diabetes mellitus (CMS HCC)          PSH:    Past Surgical History:   Procedure Laterality Date    CARDIAC CATHETERIZATION      CATARACT EXTRACTION, BILATERAL Bilateral     HX COLONOSCOPY      HX HEART CATHETERIZATION  2017 and 2018    Stented twice  prox circ  and circ    HX PARTIAL HYSTERECTOMY      tubes and ovaries left intact    HX THYROIDECTOMY  1978    HX WISDOM TEETH EXTRACTION      KNEE SURGERY  2018    muscle removed    LAPAROSCOPIC CHOLECYSTECTOMY      RECONSTRUCTION OF NOSE  1980    SKIN CANCER EXCISION      left side forehead         Social Hx:    Social History     Socioeconomic History    Marital status: Married     Spouse name: Not on file    Number of children: Not on file    Years of education: Not on file  Highest education level: Not on file   Occupational History    Not on file   Tobacco Use    Smoking status: Former     Current packs/day: 0.50     Average packs/day: 0.5 packs/day for 10.0 years (5.0 ttl pk-yrs)     Types: Cigarettes    Smokeless tobacco: Never   Vaping Use    Vaping status: Never Used   Substance and Sexual Activity    Alcohol use: Not Currently     Alcohol/week: 1.0 standard drink of alcohol     Types: 1 Cans of beer per week     Comment: drank long time ago not current.    Drug use: Never    Sexual activity: Not on file   Other Topics Concern    Ability to Walk 1 Flight of Steps without SOB/CP No    Routine Exercise Not Asked    Ability to Walk 2 Flight of Steps without SOB/CP Not Asked    Unable to Ambulate Not Asked    Total Care Not Asked    Ability To Do Own ADL's Yes    Uses Walker Not Asked    Other Activity Level Not Asked    Uses Cane Not Asked   Social History Narrative    Not on file     Social Determinants of Health     Financial Resource Strain: Not on file   Transportation Needs: Not on file   Social Connections: Not on file   Intimate Partner Violence: Not on file   Housing Stability:  Not on file     Family Hx:   Family History   Problem Relation Age of Onset    Uterine Cancer Mother         80's or 42's    Heart Attack Mother     Cardiomyopathy Father     Heart Disease Sister     Uterine Cancer Niece         20's     Allergies:   Allergies   Allergen Reactions    Pneumococcal 23-Valent Polysaccharide Vaccine  Other Adverse Reaction (Add comment)     Severe local swelling    Tramadol Nausea/ Vomiting     "makes me sick with or without food"    Iohexol  Other Adverse Reaction (Add comment) and Hives/ Urticaria      Desc: HIVES SEVERAL HRS S/P IV CONTRAST.. [place] LAST SCAN W/ BENADRYL PREMED @ [facility] '01/AC.    Lisinopril  Other Adverse Reaction (Add comment)     Other reaction(s): Cough    Other Rash     Other reaction(s): Other (See Comments)  Nickle   When teeth were wired in she place, patient obtained an infection and wiring had to be removed,  Also allergic to silver    Crestor [Rosuvastatin]      Muscle aches    Lovastatin     Statin [Atorvastatin]      Muscle ache    Iv Contrast Hives/ Urticaria    Nickel Hives/ Urticaria     Medications:   Prior to Admission Medications   Prescriptions Last Dose Informant Patient Reported? Taking?   ALPRAZolam (XANAX) 0.5 mg Oral Tablet  Patient Yes No   Sig: Take 1 Tablet (0.5 mg total) by mouth Every evening   Cholecalciferol, Vitamin D3, 10 mcg (400 unit) Oral Tablet, Chewable  Patient Yes No   Sig: Chew Once a day   Irbesartan-Hydrochlorothiazide 300-12.5 mg Oral Tablet  Patient Yes No   Sig: Take 300 mg by mouth Once a day   Isosorbide Mononitrate (IMDUR) 120 mg Oral Tablet Sustained Release 24 hr   No No   Sig: Take 1 Tablet (120 mg total) by mouth Every morning   POTASSIUM-99 ORAL  Patient Yes No   Sig: Take by mouth Once a day   amLODIPine (NORVASC) 5 mg Oral Tablet   No No   Sig: Take 1 Tablet (5 mg total) by mouth Once a day   bempedoic acid 180 mg Oral Tablet   No No   Sig: Take 1 Tablet (180 mg total) by mouth Once a day   clopidogreL (PLAVIX) 75 mg  Oral Tablet  Patient Yes No   Sig: Take 1 Tablet (75 mg total) by mouth Once a day   empagliflozin (JARDIANCE) 10 mg Oral Tablet   No No   Sig: Take 1 Tablet (10 mg total) by mouth Once a day   evolocumab (REPATHA SURECLICK) 140 mg/mL Subcutaneous Pen Injector   No No   Sig: Inject 1 pen (140 mg) subcutaneously (under the skin) once every 14 days   ezetimibe (ZETIA) 10 mg Oral Tablet   No No   Sig: TAKE 1 TABLET BY MOUTH EVERY DAY IN THE EVENING   fluticasone propionate (FLONASE NASL)  Patient Yes No   Sig: by Nasal route   levothyroxine (SYNTHROID) 112 mcg Oral Tablet  Patient Yes No   Sig: Take 1 Tablet (112 mcg total) by mouth Every morning   metoprolol tartrate (LOPRESSOR) 25 mg Oral Tablet   No No   Sig: Take 0.5 Tablets (12.5 mg total) by mouth Twice daily   nitroGLYCERIN (NITROSTAT) 0.4 mg Sublingual Tablet, Sublingual   No No   Sig: PLACE 1 TABLET UNDER TONGUE EVERY 5 MINS, UP TO 3 DOSES AS NEEDED FOR CHEST PAIN   pantoprazole (PROTONIX) 40 mg Oral Tablet, Delayed Release (E.C.)  Patient Yes No   Sig: Take 1 Tablet (40 mg total) by mouth Once a day   ranolazine (RANEXA) 500 mg Oral Tablet Sustained Release 12 hr   No No   Sig: Take 1 Tablet (500 mg total) by mouth Twice daily   valACYclovir (VALTREX) 500 mg Oral Tablet   Yes No   Sig: Take 1 Tablet (500 mg total) by mouth Twice daily   vitamin B complex (B COMPLEX-VITAMIN B12 ORAL)  Patient Yes No   Sig: Take by mouth Once a day      Facility-Administered Medications: None       Above history reviewed with patient, changes are as documented.    Physical Exam   Nursing notes reviewed.    Filed Vitals:    01/09/24 1209 01/09/24 1547 01/09/24 1645 01/09/24 1700   BP: (!) 163/66 (!) 172/80 101/71 (!) 146/92   Pulse: 66 78 65 62   Resp: 16 17 19 19    Temp: 36.3 C (97.4 F)      SpO2: 96% 97% 95% 98%        Constitutional:  Patient is nontoxic appearing and oriented to person, place, time, and situation.  There is no distress noted.  HEENT:  Head is atraumatic  and normocephalic.  External ears without abnormalities.  Oropharynx is clear with moist mucous membranes.  Nasal exam is without abnormalities.  Eyes:  PERRL, EOMI bilaterally.  Neck:  Supple with normal range of motion.  There is no anterior or posterior cervical adenopathy noted.  Cardiovascular:  Heart  is noted to have a regular rate and rhythm, without murmurs or abnormal heart sounds.  Distal pulses are intact.  Resp:  Effort is unlabored and patient is not in respiratory distress.  Breath sounds are clear to auscultation bilaterally without wheezes, rales, or rhonchi.  Abd:  Normal bowel sounds are auscultated in all 4 quadrants.  Abdomen is soft, nondistended, and non-tender to palpation.  Musculoskeletal:  Normal range of motion is noted in extremities, and there is no deformity noted.  Skin:  Skin is warm and dry, intact, and without rashes.  Neuro:  Patient is A&O x 3.  There are no gross cranial nerve deficits, and no apparent sensory or motor deficits.  Facial movements are symmetrical.  Speech is clear and appropriate.  Psych:  Normal mood and affect.  Behavior is normal.      Course  Orders, Abnormal Labs and Imaging Results:  Results up to the Time the Disposition was Entered   BASIC METABOLIC PANEL - Abnormal; Notable for the following components:       Result Value    SODIUM 130 (*)     CO2 TOTAL 22 (*)     CREATININE 1.31 (*)     ESTIMATED GFR - FEMALE 40 (*)     All other components within normal limits   TROPONIN-I - Normal   B-TYPE NATRIURETIC PEPTIDE (BNP),PLASMA - Normal   CREATINE KINASE (CK), TOTAL, SERUM OR PLASMA - Normal   PT/INR - Normal    Narrative:     In the setting of warfarin therapy, a moderate-intensity INR goal range is 2.0 to 3.0 and a high-intensity INR goal range is 2.5 to 3.5.    INR is ONLY validated to determine the level of anticoagulation with vitamin K antagonists (warfarin). Other factors may elevate the INR including but not limited to direct oral anticoagulants  (DOACs), liver dysfunction, vitamin K deficiency, DIC, factor deficiencies, and factor inhibitors.   PTT (PARTIAL THROMBOPLASTIN TIME) - Normal    Narrative:     aPTT THERAPEUTIC RANGE  57.4 - 102.7 seconds                               MAGNESIUM - Normal   PHOSPHORUS - Normal   POC BLOOD GLUCOSE (RESULTS) - Normal   TROPONIN-I - Normal   ECG 12 LEAD    Narrative:     Normal sinus rhythm  Nonspecific T wave abnormality  Abnormal ECG  When compared with ECG of 26-Mar-2022 10:42,  No significant change was found  Confirmed by Elio Forget ([id number]) on 01/09/2024 12:42:15 PM   CBC/DIFF    Narrative:     The following orders were created for panel order CBC/DIFF.  Procedure                               Abnormality         Status                     ---------                               -----------         ------                     CBC WITH  UXLK[[id number]]                                    Final result                 Please view results for these tests on the individual orders.   CBC WITH DIFF   CT BRAIN WO IV CONTRAST    Narrative:     Kelicia Footman    PROCEDURE DESCRIPTION: CT BRAIN WO IV CONTRAST    CLINICAL INDICATION: facial numbness    COMPARISON: 03/27/2022      FINDINGS: There is no intracranial hemorrhage or extra-axial fluid collection. Gray-white matter differentiation is preserved, without CT evidence of an acute territorial infarct. Brain volume is unchanged. Intracranial atherosclerotic disease is similar to the comparison study, including atherosclerotic plaque in the basilar artery. No mass effect. Near-complete and chronic opacification of the left maxillary sinus. The calvarium is intact.     XR AP MOBILE CHEST    Narrative:     Barbarita Conkle    PROCEDURE DESCRIPTION: XR AP MOBILE CHEST    TECHNIQUE: 1 views / 1 images submitted.    CLINICAL INDICATION: Cardiac issues    COMPARISON: 03/26/2022      FINDINGS: A single frontal projection of the chest was obtained.    Cardiac and mediastinal silhouettes are  within normal limits. The lungs show no focal areas of consolidation to suggest infiltrate. Vascularity is within normal limits. There is no evidence for pneumothorax. Loop recorder projects at the left chest.     aspirin chewable tablet 324 mg (324 mg Oral Given 01/09/24 1245)         MDM:          Presents with chest pain, facial tingling.  Pt was vitally stable on arrival.  She remained vitally stable during her visit.  Pt received:  Aspirin.  EKG shows normal sinus rhythm with a rate of 64.  Chest x-ray is negative for acute process.  CT brain is negative for acute intracranial abnormality.  There are no labs significant for a sodium of 130, creatinine of 1.31.  Initial and 3 hour troponin were both negative.  Explained to patient with her cardiac history, under normal circumstances I would advise admission and observation.  Explained to patient that there are currently no available beds at our facility, and there has been a greater than 24 hour delay in transferring patients to other facilities.  Patient states she has been pain-free since she has been in the department, and she feels like she would prefer to go home.  Explained limitations in evaluating facial tingling.  Patient has an allergy to IV contrast, and we are unable to pre treat her.  We do not have MRI to facility today.  Patient has had these symptoms for almost a week.  She understands limitations in evaluating this.  Results discussed with pt.   Plan:  Discussed conservative management of patient's symptoms.  Patient will contact her cardiologist for follow-up in 2 days.  Discussed return precautions.  Patient will follow up as directed.  Pt is agreeable with plan.   Pt was given the opportunity to ask questions and denies any questions/concerns at this time.       Impression:   Clinical Impression   Chest pain, unspecified type (Primary)   Facial paresthesia     Disposition:  Discharged    Discharge:  Following the above history, physical exam,  and studies, the patient was deemed stable and suitable for discharge.  It was advised that the patient return to the ED if they develop new or any other concerning symptoms and follow up as directed.   The patient verbalized understanding of all instructions and had no further questions or concerns.  Follow Up:   Marko Stai, PA-C  [address]  [place] [address]  [phone]    Go in 2 days  for reevaluation    Wilfrid Lund, MD  [address]  [address]  [place] [address]  [phone]    Go in 2 days  for follow up    Prescriptions:   New Prescriptions    No medications on file        Future Appointments   Date Time Provider Department Center   02/13/2024  1:15 PM Melbourne Abts, MD [facility]   02/17/2024  1:00 PM Launa Flight, PA-C [facility]   03/24/2024 11:00 AM Shomo-Cross, Benetta Spar, NP [facility]       Chart was dictated using voice recognition software, which may lead to minor grammatical or syntax errors.     Vic Blackbird Inaki Vantine, PA-C  01/09/2024, 12:23

## 2024-01-09 NOTE — Respiratory Therapy (Signed)
Notified at this time for ECG for chest pain

## 2024-01-09 NOTE — Discharge Instructions (Addendum)
Continue current medications.    Contact your cardiologist for follow up in 2-3 days.    Return for worsening chest pain, trouble breathing, dizziness, or other concerns.

## 2024-01-09 NOTE — ED Nurses Note (Cosign Needed)
Pt returned to floor from CT by radiology, bed locked and in lowest position, pt's husband at bedside. Pt denies needs.

## 2024-01-09 NOTE — ED Nurses Note (Cosign Needed)
RN [name] [name] inserted IV, 18 g, Right AC. Nursing student [name] [name] at bedside, spouse at bedside. Pt reports head and legs feel "numb". Pt requests food, RN educates reason for NPO status at this time. Pt denies needs at this time.

## 2024-01-09 NOTE — ED Nurses Note (Signed)
DC given and explained. Pt stated understanding. IV removed and in tact. Pt tolerated well. VS updated and stable. Pt dressed and ambulated to the exit.

## 2024-01-09 NOTE — ED Nurses Note (Signed)
Troponin drew from IV site by Festus Holts, nursing student. RN Florentina Addison Loudin observed, pt denied needs at this time, spouse at bedside, denied needs.

## 2024-01-17 ENCOUNTER — Other Ambulatory Visit: Payer: Self-pay

## 2024-01-17 ENCOUNTER — Ambulatory Visit (INDEPENDENT_AMBULATORY_CARE_PROVIDER_SITE_OTHER): Payer: Medicare PPO | Admitting: Cardiovascular Disease

## 2024-01-17 ENCOUNTER — Ambulatory Visit: Payer: Medicare PPO | Attending: Cardiovascular Disease | Admitting: Cardiovascular Disease

## 2024-01-17 ENCOUNTER — Ambulatory Visit (INDEPENDENT_AMBULATORY_CARE_PROVIDER_SITE_OTHER): Payer: Medicare PPO

## 2024-01-17 ENCOUNTER — Encounter (INDEPENDENT_AMBULATORY_CARE_PROVIDER_SITE_OTHER): Payer: Self-pay | Admitting: Cardiovascular Disease

## 2024-01-17 ENCOUNTER — Other Ambulatory Visit (HOSPITAL_BASED_OUTPATIENT_CLINIC_OR_DEPARTMENT_OTHER): Payer: Self-pay | Admitting: Cardiovascular Disease

## 2024-01-17 VITALS — BP 149/66 | HR 99 | Ht 62.0 in | Wt 158.0 lb

## 2024-01-17 DIAGNOSIS — I10 Essential (primary) hypertension: Secondary | ICD-10-CM

## 2024-01-17 DIAGNOSIS — I2510 Atherosclerotic heart disease of native coronary artery without angina pectoris: Secondary | ICD-10-CM

## 2024-01-17 DIAGNOSIS — I200 Unstable angina: Secondary | ICD-10-CM | POA: Insufficient documentation

## 2024-01-17 DIAGNOSIS — E7801 Familial hypercholesterolemia: Secondary | ICD-10-CM

## 2024-01-17 DIAGNOSIS — Z01812 Encounter for preprocedural laboratory examination: Secondary | ICD-10-CM | POA: Insufficient documentation

## 2024-01-17 DIAGNOSIS — Z789 Other specified health status: Secondary | ICD-10-CM

## 2024-01-17 DIAGNOSIS — I7121 Aneurysm of the ascending aorta, without rupture (CMS HCC): Secondary | ICD-10-CM

## 2024-01-17 DIAGNOSIS — R55 Syncope and collapse: Secondary | ICD-10-CM

## 2024-01-17 DIAGNOSIS — I6523 Occlusion and stenosis of bilateral carotid arteries: Secondary | ICD-10-CM

## 2024-01-17 LAB — CBC WITH DIFF
BASOPHIL #: <0.1 10*3/uL (ref <=0.20)
BASOPHIL %: 0.5 %
EOSINOPHIL #: <0.1 10*3/uL (ref <=0.50)
EOSINOPHIL %: 1.1 %
HCT: 40.8 % (ref 34.8–46.0)
HGB: 13.5 g/dL (ref 11.5–16.0)
IMMATURE GRANULOCYTE #: <0.1 10*3/uL (ref <0.10)
IMMATURE GRANULOCYTE %: 0.4 % (ref 0.0–1.0)
LYMPHOCYTE #: 2.3 10*3/uL (ref 1.00–4.80)
LYMPHOCYTE %: 30.4 %
MCH: 30.8 pg (ref 26.0–32.0)
MCHC: 33.1 g/dL (ref 31.0–35.5)
MCV: 92.9 fL (ref 78.0–100.0)
MONOCYTE #: 0.7 10*3/uL (ref 0.20–1.10)
MONOCYTE %: 9.3 %
MPV: 10.3 fL (ref 8.7–12.5)
NEUTROPHIL #: 4.41 10*3/uL (ref 1.50–7.70)
NEUTROPHIL %: 58.3 %
PLATELETS: 330 10*3/uL (ref 150–400)
RBC: 4.39 10*6/uL (ref 3.85–5.22)
RDW-CV: 12.9 % (ref 11.5–15.5)
WBC: 7.6 10*3/uL (ref 3.7–11.0)

## 2024-01-17 LAB — BASIC METABOLIC PANEL
ANION GAP: 11 mmol/L (ref 4–13)
BUN/CREA RATIO: 18 (ref 6–22)
BUN: 19 mg/dL (ref 8–25)
CALCIUM: 9.8 mg/dL (ref 8.6–10.3)
CHLORIDE: 100 mmol/L (ref 96–111)
CO2 TOTAL: 24 mmol/L (ref 23–31)
CREATININE: 1.08 mg/dL — ABNORMAL HIGH (ref 0.60–1.05)
ESTIMATED GFR - FEMALE: 51 mL/min/BSA — ABNORMAL LOW (ref >=60)
GLUCOSE: 133 mg/dL — ABNORMAL HIGH (ref 65–125)
POTASSIUM: 4.8 mmol/L (ref 3.5–5.1)
SODIUM: 135 mmol/L — ABNORMAL LOW (ref 136–145)

## 2024-01-17 LAB — PT/INR
INR: 0.99 (ref 0.80–1.20)
PROTHROMBIN TIME: 10.9 s (ref 9.2–13.2)

## 2024-01-17 MED ORDER — FAMOTIDINE 20 MG TABLET
20.0000 mg | ORAL_TABLET | Freq: Two times a day (BID) | ORAL | 0 refills | Status: DC
Start: 2024-01-17 — End: 2024-01-22

## 2024-01-17 MED ORDER — DIPHENHYDRAMINE 25 MG CAPSULE
25.0000 mg | ORAL_CAPSULE | Freq: Three times a day (TID) | ORAL | 0 refills | Status: DC
Start: 2024-01-17 — End: 2024-01-22

## 2024-01-17 MED ORDER — METOPROLOL SUCCINATE ER 50 MG TABLET,EXTENDED RELEASE 24 HR
50.0000 mg | ORAL_TABLET | Freq: Every day | ORAL | 3 refills | Status: DC
Start: 2024-01-17 — End: 2024-09-23

## 2024-01-17 MED ORDER — PREDNISONE 20 MG TABLET
20.0000 mg | ORAL_TABLET | Freq: Three times a day (TID) | ORAL | 0 refills | Status: DC
Start: 2024-01-17 — End: 2024-01-22

## 2024-01-17 NOTE — Patient Instructions (Addendum)
PATIENT INSTRUCTIONS Cardiac Cath  [facility]  [address] [place] [address]  Phone / [phone]   Fax / [phone]     Referring physician: Dr. Georgette Shell to Dr. Ledon Snare   Patient phone number: [phone]  Insurance: [employer]   Information Discussed with: patient     For questions, regarding pre-operative instructions please call: [phone] and ask for Justin Mend    Plan:  Cardiac Cath on 01/22/2024 at 1 pm.      Please arrive at the [facility] Surgical registration on the 2nd floor of the main hospital. This will be about 2 hours prior to your procedure at 11 am.     Pre Plan for procedure:   Patient Weight: 158 lb or 71.7 kg.  (less than 450lb/204kg)  Non-Fasting Labs: Will be obtained 01/17/2024 at the [facility].   Pre-Procedure/Op needs- Within 5-7days of procedure- NON FASTING Labs BMP, CBC, PT, INR  Dental Clearance: Not Applicable    Pre-procedure/operative instructions:    1. Attire: Please dress in loose comfortable clothing.      2. Diet Instructions:   May have a light breakfast the morning of the heart cath (juice, toast, eggs). Nothing to eat or drink after 8 am.     3. Medication instructions:  Contrast Allergy-Yes    ~Pre-Medication Instructions:  Please start pre-medications 48 hours prior to procedure.     Monday, February 17: Morning: (Take one Prednisone 20 mg, one Benadryl 25 mg and one Pepcid 20 mg), Afternoon: (Take one Prednisone 20 mg and one Benadryl 25 mg), Evening: (Take one Prednisone 20 mg, one Benadryl 25 mg and one Pepcid 20 mg).    Tuesday, February 18:Morning: (Take one Prednisone 20 mg, one Benadryl 25 mg and one Pepcid 20 mg), Afternoon: (Take one Prednisone 20 mg and one Benadryl 25 mg), Evening: (Take one Prednisone 20 mg, one Benadryl 25 mg and one Pepcid 20 mg).    Wednesday, February 19: Morning of the procedure: (Take one Prednisone 20 mg, one Benadryl 25 mg and one Pepcid 20 mg)    You may take all other meds with  a sip of water, including your inhalers or eye drops as prescribed, with the exceptions as noted below.    Please CONTINUE- to take  Plavix, if you currently are on them. You do not need to hold antiplatelet agents in preparation for this procedure.     Oral Diabetic Medication:    Please STOP 3 days prior to procedure and HOLD day of the procedure- Jardiance (Last dose of medication on Saturday, January 18, 2024)    4. Transportation:   You will need someone to drive you home when you are discharged  The plan is for you to go home on the day of the procedure, but you may wish to pack a bag, as you may stay in the hospital up to one overnight, depending on timing of procedure and complexity.  If Dr. Ledon Snare places a stent you will stay overnight for observation.     5. Directions day of procedure:  Your procedure(s) will take place in the [facility]  6. Post procedure anesthesia safety:  We request that you have someone stay with you overnight if discharged the same day as your procedure.  For the first 24-48 hours after your procedure:  ?   Don't drive or use heavy equipment.  ?   Don't make important decisions or sign legal documents. If important decisions  or signing legal documents is necessary during the first 24 hours after surgery, have a trusted family member or spouse act on your behalf.  ?   Don't drink alcohol.  POST HEART CATH INSTRUCTIONS: No Driving for 4-5 days, No Heavy Lifting greater than 5 pounds for one week.     Instructions discussed with patient during phone call and mailed to patient.

## 2024-01-17 NOTE — Progress Notes (Signed)
Labs obtained / sent.  Ricka Burdock, [place] 01/17/2024 10:54

## 2024-01-17 NOTE — Addendum Note (Signed)
Addended by: Justin Mend A on: 01/17/2024 12:48 PM     Modules accepted: Orders

## 2024-01-17 NOTE — Telephone Encounter (Signed)
Patient referred to Dr. Ledon Snare by Dr. Wilfrid Lund for a heart catheterization at [facility].     Patient's heart cath scheduled for Wednesday, January 22, 2024 at 1 pm. Case request and routine labs ordered by Dr. Georgette Shell. Labs obtained at the [facility]. Authorization obtained by patient's insurance [employer]. Detailed heart cath and medication instructions discussed with patient during phone call. Patient instructed to return call to office with any questions. Patient voiced understanding. Justin Mend, RN

## 2024-01-17 NOTE — Nursing Note (Signed)
Pt did not have a med list with them at visit - went over list with patient   Advised patient to check AVS list with medicines at home and call if any discrepancies    -Confirmed correct pharmacy with patient for e-scribing   Ricka Burdock, MA 01/17/2024 10:04

## 2024-01-17 NOTE — Progress Notes (Signed)
Cardiology [facility], Medical Office Building  [address]  [place] [address]  [phone]    Cardiology  [facility]     Name: [name] [name]   DOB: 05/13/1941  [83 y.o. female]   MRN: [id number]       Visit Date: 01/17/2024   Referring: No referring provider defined for this encounter.   PCP: Marko Stai, PA-C       Reason For Visit:  Initial office visit with cardiology.  Chief Complaint: Hospital Follow Up    Information provided by: patient    Interval History:  Patient presents for follow-up of her coronary artery disease.  She started having chest discomfort about two weeks ago and has had daily chest discomfort.  She describes it as a central pressure and heaviness with radiation down her left arm with shortness of breath.  It is there at rest but is worse with physical activity and worse with emotional stress.  She initially self treated with her husband's oxygen with a little improvement of the discomfort puts certainly not with resolution of her symptoms.  Over the past two weeks she has had pain on a daily basis and sometimes in a somewhat continuous fashion.  She is actually having chest pressure during our office visit.  This feels identical to her previous cardiac symptoms.  She was evaluated at [facility] emergency room but when informed about the bed shortage she elected for outpatient workup and presents today with these symptoms.    Past Medical History: The patient  has a past medical history of Cancer (CMS [facility]), Coronary artery disease, CVA (cerebral vascular accident) (CMS HCC) (2009), Dyspnea on exertion, Essential hypertension, Hyperlipidemia, PONV (postoperative nausea and vomiting), Skin cancer, Stented coronary artery, and Type 2 diabetes mellitus (CMS HCC).    She has no past medical history of Breast CA (CMS HCC), Cervical cancer (CMS HCC), Colon cancer (CMS HCC), Endometrial cancer (CMS HCC), Ovarian cancer (CMS HCC), Sleep apnea, Treatment, or  Uterine cancer (CMS HCC).     Current Medications:   ALPRAZolam (XANAX) 0.5 mg Oral Tablet, Take 1 Tablet (0.5 mg total) by mouth Every evening  amLODIPine (NORVASC) 5 mg Oral Tablet, Take 1 Tablet (5 mg total) by mouth Once a day  bempedoic acid 180 mg Oral Tablet, Take 1 Tablet (180 mg total) by mouth Once a day  Cholecalciferol, Vitamin D3, 10 mcg (400 unit) Oral Tablet, Chewable, Chew Once a day  clopidogreL (PLAVIX) 75 mg Oral Tablet, Take 1 Tablet (75 mg total) by mouth Once a day  empagliflozin (JARDIANCE) 10 mg Oral Tablet, Take 1 Tablet (10 mg total) by mouth Once a day  evolocumab (REPATHA SURECLICK) 140 mg/mL Subcutaneous Pen Injector, Inject 1 pen (140 mg) subcutaneously (under the skin) once every 14 days  ezetimibe (ZETIA) 10 mg Oral Tablet, TAKE 1 TABLET BY MOUTH EVERY DAY IN THE EVENING  fluticasone propionate (FLONASE NASL), by Nasal route  Irbesartan-Hydrochlorothiazide 300-12.5 mg Oral Tablet, Take 300 mg by mouth Once a day  Isosorbide Mononitrate (IMDUR) 120 mg Oral Tablet Sustained Release 24 hr, Take 1 Tablet (120 mg total) by mouth Every morning  levothyroxine (SYNTHROID) 112 mcg Oral Tablet, Take 1 Tablet (112 mcg total) by mouth Every morning  nitroGLYCERIN (NITROSTAT) 0.4 mg Sublingual Tablet, Sublingual, PLACE 1 TABLET UNDER TONGUE EVERY 5 MINS, UP TO 3 DOSES AS NEEDED FOR CHEST PAIN  pantoprazole (PROTONIX) 40 mg Oral Tablet, Delayed Release (E.C.), Take 1 Tablet (40 mg  total) by mouth Once a day  POTASSIUM-99 ORAL, Take by mouth Once a day  ranolazine (RANEXA) 500 mg Oral Tablet Sustained Release 12 hr, Take 1 Tablet (500 mg total) by mouth Twice daily  valACYclovir (VALTREX) 500 mg Oral Tablet, Take 1 Tablet (500 mg total) by mouth Twice daily  vitamin B complex (B COMPLEX-VITAMIN B12 ORAL), Take by mouth Once a day  metoprolol tartrate (LOPRESSOR) 25 mg Oral Tablet, Take 0.5 Tablets (12.5 mg total) by mouth Twice daily    No facility-administered medications prior to visit.        Allergies: Patient is is allergic to pneumococcal 23-valent polysaccharide vaccine, tramadol, iohexol, lisinopril, other, crestor [rosuvastatin], lovastatin, statin [atorvastatin], iv contrast, and nickel.     ROS:   Ten systems review was performed which was negative except as noted by pertinent positives in the HPI above.    Physical Examination:  VS: BP (!) 149/66 (Site: Right Arm, Patient Position: Sitting)   Pulse 99   Ht 1.575 m (5\' 2" )   Wt 71.7 kg (158 lb)   SpO2 99%   BMI 28.90 kg/m       Physical Exam  Vitals and nursing note reviewed.   Constitutional:       General: She is not in acute distress.     Appearance: She is well-developed. She is not diaphoretic.   HENT:      Head: Normocephalic and atraumatic.   Eyes:      General: No scleral icterus.     Conjunctiva/sclera: Conjunctivae normal.   Neck:      Vascular: No JVD.   Cardiovascular:      Rate and Rhythm: Normal rate and regular rhythm.      Pulses: Intact distal pulses.      Heart sounds: Normal heart sounds. No murmur heard.     No friction rub. No gallop.   Pulmonary:      Effort: Pulmonary effort is normal. No respiratory distress.      Breath sounds: Normal breath sounds. No wheezing or rales.   Chest:      Chest wall: No tenderness.   Musculoskeletal:         General: No tenderness. Normal range of motion.      Cervical back: Normal range of motion and neck supple.   Skin:     General: Skin is warm and dry.      Findings: No rash.   Neurological:      Mental Status: She is alert and oriented to person, place, and time.      Cranial Nerves: No cranial nerve deficit.   Psychiatric:         Behavior: Behavior normal.         Thought Content: Thought content normal.         Laboratory  I have reviewed the patient's lab values and culture results. Pertinent results are below:    CBC    Lab Results   Component Value Date    WBC 8.0 01/09/2024    HGB 13.0 01/09/2024    HCT 37.8 01/09/2024    PLTCNT 279 01/09/2024    RBC 4.15 01/09/2024     MCV 91.1 01/09/2024    MCHC 34.4 01/09/2024    MCH 31.3 01/09/2024    MPV 9.2 01/09/2024        Comprehensive Metabolic Profile   Lab Results   Component Value Date    SODIUM 130 (L) 01/09/2024    POTASSIUM 4.2  01/09/2024    CHLORIDE 100 01/09/2024    CO2 22 (L) 01/09/2024    ANIONGAP 8 01/09/2024    BUN 25 01/09/2024    CREATININE 1.31 (H) 01/09/2024    GLUCOSE 97 01/09/2024      Lab Results   Component Value Date    ALBUMIN 3.5 03/26/2022    TOTALPROTEIN 7.1 03/26/2022    AST 13 03/26/2022    ALT 13 03/26/2022        Lab Results   Component Value Date    BNP 90 01/09/2024    TSH 0.155 (L) 03/27/2022    HA1C 6.1 (H) 01/17/2022    INR 1.04 01/09/2024       Lab Results   Component Value Date    TRIG 147 01/19/2022    HDLCHOL 51 01/19/2022    LDLCHOL 94 01/19/2022    CHOLESTEROL 171 01/19/2022       Diagnostics  , Results for orders placed during the hospital encounter of 01/17/22    TRANSTHORACIC ECHOCARDIOGRAM - ADULT 01/20/2022  9:36 AM    Narrative  **See full report in linked PDF document**  Transthoracic Echocardiographic Report    ______________________________________________________________________________  Name: [name] [name], [name] [name]                           MRN: [id number]               Weight: 163 lb  Study Date: 01/20/2022 09:04 AM              DOB: August 08, 1941             Height: 62 in  Gender: Female                               Age: 66 yrs                 BSA: 1.8 m2  Accession #: [id number]                    BP: 167/52 mmHg  Patient Location: [facility]  Ordering Provider: Carmela Rima  TechLinton Ham, RTR    ______________________________________________________________________________  Procedure:  Transthoracic complete echo with contrast, 2D, spectral and tissue Doppler, color flow Doppler, M-mode.    Quality:  The study images were of technically adequate quality.    Indications: ACS (acute coronary syndrome) (CMS HCC),CAD (coronary artery disease),Chest pain    Conclusions:  Left Ventricle:  Normal left ventricular size. Normal geometry. Left ventricular systolic function is normal. Ejection Fraction is 68.5 %. Hypokinetic mid inferolateral (posterior) wall. Hypokinetic  mid anterolateral wall. Abnormal diastolic function, low filling pressure.  Right Ventricle: The right ventricle is not well visualized. Normal right ventricular systolic function. Right ventricular systolic pressure is normal.    Findings  Left Ventricle:   Normal left ventricular size. Normal geometry. Left ventricular systolic function is normal. Ejection Fraction is 68.5 %. Hypokinetic mid inferolateral (posterior) wall. Hypokinetic  mid anterolateral wall. Abnormal diastolic function, low filling pressure.  Right Ventricle:   The right ventricle is not well visualized. Normal right ventricular systolic function. Right ventricular systolic pressure is normal.  Left Atrium:   The left atrium is normal in size.  Right Atrium:   The right atrium is not well visualized.  Mitral Valve:   The mitral valve is normal.  Tricuspid Valve:   The tricuspid valve  is normal. Trace tricuspid regurgitation present.  Aortic Valve:   The aortic valve is not well visualized. Trileaflet aortic valve. No Aortic valve stenosis.  Pulmonic Valve:   The pulmonic valve is not well visualized.  IVC/Hepatic Veins:   Normal IVC size with >50% inspiratory collapse (estimated RA pressure 3 mmHg).  Aorta:   The aortic root is of normal size. The ascending aorta is normal in size.  Pericardium/Pleural space:   Normal pericardium with no pericardial effusion.    Electronically signed by: MD Susanne Borders on 01/20/2022 11:58 AM  , and No results found for this or any previous visit.      Assessment & Plan:    ICD-10-CM    1. CAD in native artery  I25.10       2. Familial hypercholesterolemia  E78.01       3. Essential hypertension  I10       4. Statin intolerance  Z78.9       5. Syncope, unspecified syncope type  R55       6. 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 Bilateral carotid artery stenosis  I65.23          Patient with known coronary artery disease who is having unstable angina.  I have stated clearly that she needs to be admitted to the hospital with further cardiac workup.  She does not agree to this and would agree to outpatient workup.  She understands the risk of this decision but we will abide by her wishes.  I would like to increase her metoprolol to metoprolol succinate 50 mg q.day. discontinue the metoprolol tartrate.  She is to present herself to the emergency room for any worsening of her symptoms or should she change her mind on considering an inpatient workup.  Otherwise we will plan on offering her a catheterization next week which she would be willing to consider     Follow-up  No follow-ups on file.       Wilfrid Lund, MD

## 2024-01-20 ENCOUNTER — Other Ambulatory Visit: Payer: Self-pay

## 2024-01-20 ENCOUNTER — Encounter (HOSPITAL_COMMUNITY): Payer: Self-pay | Admitting: Cardiovascular Disease

## 2024-01-21 MED ORDER — DEXTROSE 5% IN WATER (D5W) FLUSH BAG - 250 ML
INTRAVENOUS | Status: DC | PRN
Start: 2024-01-21 — End: 2024-01-22

## 2024-01-21 MED ORDER — SODIUM CHLORIDE 0.9 % INTRAVENOUS SOLUTION
Freq: Once | INTRAVENOUS | Status: AC
Start: 2024-01-22 — End: 2024-01-22

## 2024-01-21 MED ORDER — SODIUM CHLORIDE 0.9% FLUSH BAG - 250 ML
INTRAVENOUS | Status: DC | PRN
Start: 2024-01-21 — End: 2024-01-22

## 2024-01-22 ENCOUNTER — Ambulatory Visit (HOSPITAL_COMMUNITY): Payer: Medicare PPO | Admitting: Cardiovascular Disease

## 2024-01-22 ENCOUNTER — Encounter (HOSPITAL_COMMUNITY)
Admission: RE | Disposition: A | Discharge status: Home / Self Care | Payer: Self-pay | Source: Ambulatory Visit | Attending: Cardiovascular Disease

## 2024-01-22 ENCOUNTER — Other Ambulatory Visit: Payer: Self-pay

## 2024-01-22 ENCOUNTER — Ambulatory Visit
Admission: RE | Admit: 2024-01-22 | Discharge: 2024-01-22 | Disposition: A | Discharge status: Home / Self Care | Payer: Medicare PPO | Source: Ambulatory Visit | Attending: Cardiovascular Disease | Admitting: Cardiovascular Disease

## 2024-01-22 ENCOUNTER — Encounter (HOSPITAL_COMMUNITY): Payer: Self-pay | Admitting: Cardiovascular Disease

## 2024-01-22 DIAGNOSIS — I25118 Atherosclerotic heart disease of native coronary artery with other forms of angina pectoris: Secondary | ICD-10-CM

## 2024-01-22 DIAGNOSIS — I25110 Atherosclerotic heart disease of native coronary artery with unstable angina pectoris: Secondary | ICD-10-CM | POA: Insufficient documentation

## 2024-01-22 DIAGNOSIS — E119 Type 2 diabetes mellitus without complications: Secondary | ICD-10-CM | POA: Insufficient documentation

## 2024-01-22 DIAGNOSIS — I10 Essential (primary) hypertension: Secondary | ICD-10-CM | POA: Insufficient documentation

## 2024-01-22 DIAGNOSIS — I200 Unstable angina: Secondary | ICD-10-CM

## 2024-01-22 DIAGNOSIS — Z87891 Personal history of nicotine dependence: Secondary | ICD-10-CM | POA: Insufficient documentation

## 2024-01-22 DIAGNOSIS — Z955 Presence of coronary angioplasty implant and graft: Secondary | ICD-10-CM | POA: Insufficient documentation

## 2024-01-22 DIAGNOSIS — E785 Hyperlipidemia, unspecified: Secondary | ICD-10-CM | POA: Insufficient documentation

## 2024-01-22 DIAGNOSIS — Z8673 Personal history of transient ischemic attack (TIA), and cerebral infarction without residual deficits: Secondary | ICD-10-CM | POA: Insufficient documentation

## 2024-01-22 DIAGNOSIS — Z888 Allergy status to other drugs, medicaments and biological substances status: Secondary | ICD-10-CM | POA: Insufficient documentation

## 2024-01-22 LAB — POC BLOOD GLUCOSE (RESULTS): GLUCOSE, POC: 116 mg/dL — ABNORMAL HIGH (ref 70–110)

## 2024-01-22 SURGERY — CORONARY ANGIOGRAPHY W/LEFT HEART CATH W/WO LVG
Anesthesia: IV Sedation (Nurse Monitored)

## 2024-01-22 MED ORDER — LIDOCAINE HCL 20 MG/ML (2 %) INJECTION SOLUTION
Freq: Once | INTRAMUSCULAR | Status: DC | PRN
Start: 2024-01-22 — End: 2024-01-22
  Administered 2024-01-22: 10 mL via INTRADERMAL

## 2024-01-22 MED ORDER — HEPARIN (PORCINE) (PF) 2,000 UNIT/1,000 ML IN 0.9 % SODIUM CHLORIDE IV
Freq: Once | INTRAVENOUS | Status: DC | PRN
Start: 2024-01-22 — End: 2024-01-22
  Administered 2024-01-22 (×2): 1000 mL

## 2024-01-22 MED ORDER — MIDAZOLAM 1 MG/ML INJECTION WRAPPER
Freq: Once | INTRAMUSCULAR | Status: DC | PRN
Start: 2024-01-22 — End: 2024-01-22
  Administered 2024-01-22: 1 mg via INTRAVENOUS

## 2024-01-22 MED ORDER — IOPAMIDOL 300 MG IODINE/ML (61 %) INTRAVENOUS SOLUTION
Freq: Once | INTRAVENOUS | Status: DC | PRN
Start: 2024-01-22 — End: 2024-01-22
  Administered 2024-01-22: 80 mL via INTRAMUSCULAR

## 2024-01-22 MED ORDER — HYDRALAZINE 20 MG/ML INJECTION SOLUTION
10.0000 mg | INTRAMUSCULAR | Status: DC | PRN
Start: 2024-01-22 — End: 2024-01-22

## 2024-01-22 MED ORDER — LIDOCAINE HCL 20 MG/ML (2 %) INJECTION SOLUTION
INTRAMUSCULAR | Status: AC
Start: 2024-01-22 — End: 2024-01-22
  Filled 2024-01-22: qty 20

## 2024-01-22 MED ORDER — FENTANYL (PF) 50 MCG/ML INJECTION SOLUTION
Freq: Once | INTRAMUSCULAR | Status: DC | PRN
Start: 2024-01-22 — End: 2024-01-22
  Administered 2024-01-22: 25 ug via INTRAVENOUS

## 2024-01-22 MED ORDER — HYDRALAZINE 20 MG/ML INJECTION SOLUTION
Freq: Once | INTRAMUSCULAR | Status: DC | PRN
Start: 2024-01-22 — End: 2024-01-22
  Administered 2024-01-22: 10 mg via INTRAVENOUS

## 2024-01-22 MED ORDER — HYDROCORTISONE SOD SUCCINATE (PF) 100 MG/2 ML SOLUTION FOR INJECTION
Freq: Once | INTRAMUSCULAR | Status: DC | PRN
Start: 2024-01-22 — End: 2024-01-22
  Administered 2024-01-22: 100 mg via INTRAVENOUS

## 2024-01-22 MED ORDER — SODIUM CHLORIDE 0.9 % INTRAVENOUS SOLUTION
INTRAVENOUS | Status: DC
Start: 2024-01-22 — End: 2024-01-22

## 2024-01-22 MED ORDER — MIDAZOLAM 1 MG/ML INJECTION WRAPPER
INTRAMUSCULAR | Status: AC
Start: 2024-01-22 — End: 2024-01-22
  Filled 2024-01-22: qty 2

## 2024-01-22 MED ORDER — FENTANYL (PF) 50 MCG/ML INJECTION SOLUTION
25.0000 ug | INTRAMUSCULAR | Status: DC | PRN
Start: 2024-01-22 — End: 2024-01-22

## 2024-01-22 MED ORDER — HYDRALAZINE 20 MG/ML INJECTION SOLUTION
INTRAMUSCULAR | Status: AC
Start: 2024-01-22 — End: 2024-01-22
  Filled 2024-01-22: qty 1

## 2024-01-22 MED ORDER — PANTOPRAZOLE 40 MG TABLET,DELAYED RELEASE
40.0000 mg | DELAYED_RELEASE_TABLET | Freq: Once | ORAL | Status: AC
Start: 2024-01-22 — End: 2024-01-22
  Administered 2024-01-22: 40 mg via ORAL
  Filled 2024-01-22 (×2): qty 1

## 2024-01-22 MED ORDER — FENTANYL (PF) 50 MCG/ML INJECTION SOLUTION
INTRAMUSCULAR | Status: AC
Start: 2024-01-22 — End: 2024-01-22
  Filled 2024-01-22: qty 1

## 2024-01-22 SURGICAL SUPPLY — 15 items
APPL 70% ISPRP 2% CHG 26ML CHLRPRP HI-LT ORNG PREP STRL LF  DISP CLR (MED SURG SUPPLIES) ×2 IMPLANT
CATH ANGIO 6FR 145D PGTL CURVE 110CM EXPO FULL LGTH WRE BRD RBST SHAFT PERI CARDIAC TRILON (CATHETERS) ×1 IMPLANT
CATH ANGIO 6FR FL4 CURVE 100CM EXPO WRE BRD RBST SHAFT FEM TRILON (CATHETERS) ×1 IMPLANT
CATH ANGIO 6FR FR4 CURVE 100CM EXPO FULL LGTH WRE BRD RBST SHAFT VENTRIC TRILON (CATHETERS) ×1 IMPLANT
ELECTRODE DEFIBR ADULT CNDCT GEL RTS EDGE SYS QCMB DISP (MED SURG SUPPLIES) ×1 IMPLANT
GW HITRQ VERSA .035IN 145CM COR TO TIP STRBL SS PTFE VAS PERI STD DISP (WIRE) ×1 IMPLANT
ISOVUE 300 IBP 6X500 ML BTL US (CONTRAST) ×1 IMPLANT
KIT ANGIO NAMIC MTS CTH LB STRL LF  DISP UN HOSPITAL CNTR (IV TUBING & ACCESSORIES) ×1 IMPLANT
NEEDLE 7CM 18GA TW WO WNG SHLD ANGIOGRAPY SAF PROC STRL LF  SECURELOC 12ML (MED SURG SUPPLIES) ×1 IMPLANT
SENSOR ADULT NLCR LOSAT 18IN C30+ KG OXIMETER SPO2 ADH TEAR RST BANDAGE PVC STRL LF  DISP (MED SURG SUPPLIES) ×1 IMPLANT
SET ANGIO 72IN IV TUBE MICRO DRIP CHAMBER MH FILTER MALE LL STRL NAMIC .162IN .118IN (IV TUBING & ACCESSORIES) ×1 IMPLANT
SHEATH 6FR 11CM 50CM PRELUDE PRO .038IN HUB SNPFT DIL TUBE GW INTROD GRN (INTRODUCER) IMPLANT
SHEATH 6FR 23CM 50CM PRELUDE .038IN GRN HUB GW HMSTS VALVE INTROD (INTRODUCER) ×1 IMPLANT
TRAY UNIVERSAL CARDIOVASCULAR ~~LOC~~ - UNITED HOSPITAL CTR. (CUSTOM TRAYS & PACK) ×1 IMPLANT
TUBING ANGIO 72IN MALE TO MALE LL 1 WY CK VALVE CHAMBER 12ML SQUEEZE CNRST CONTROLLR STRL (IV TUBING & ACCESSORIES) ×1 IMPLANT

## 2024-01-22 NOTE — Discharge Instructions (Addendum)
Take dressing of in 24 hours in shower, get area wet and soapy for easier removal of tape.  No driving 4-5 days  No lift more than 5lbs for 1 week

## 2024-01-22 NOTE — H&P (Signed)
[facility]iology  H&P    [name] [name]       [id number]       Date of service: 01/22/2024  Date of Admission:  01/22/2024    CHIEF COMPLAINT:  Chest pain    HISTORY OF PRESENT ILLNESS:  The patient is an 83 year old lady who is coming in for heart catheterization secondary to unstable anginal-type symptoms she does have a history of circumflex angioplasty with most recent drug-eluting stent placed in the very proximal circumflex which was a 3.5 mm stent.  She says the chest pain has started here in the recent past is consistent with her previous anginal symptoms.  She has used her husband's oxygen with little help.  She has a history of stroke hypertension dyslipidemia and diabetes in his dye allergic however has been pretreated.  She has had no heart failure no claudication she is pain-free at present.    Past Medical History:   Diagnosis Date    Cancer (CMS [facility])     skin cancer -- forehead  2024    Coronary artery disease     CVA (cerebral vascular accident) (CMS HCC) 2009    Dyspnea on exertion     Essential hypertension     Hyperlipidemia     PONV (postoperative nausea and vomiting)     Skin cancer     Stented coronary artery     Type 2 diabetes mellitus (CMS HCC)            Past Surgical History:   Procedure Laterality Date    CARDIAC CATHETERIZATION      CATARACT EXTRACTION, BILATERAL Bilateral     HX COLONOSCOPY      HX HEART CATHETERIZATION  2017 and 2018    Stented twice  prox circ  and circ    HX PARTIAL HYSTERECTOMY      tubes and ovaries left intact    HX THYROIDECTOMY  1978    HX WISDOM TEETH EXTRACTION      KNEE SURGERY  2018    muscle removed    LAPAROSCOPIC CHOLECYSTECTOMY      RECONSTRUCTION OF NOSE  1980    SKIN CANCER EXCISION      left side forehead           Family Medical History:       Problem Relation (Age of Onset)    Cardiomyopathy Father    Heart Attack Mother    Heart Disease Sister    Uterine Cancer Mother, Niece              Social History     Socioeconomic History    Marital status: Married      Spouse name: Not on file    Number of children: Not on file    Years of education: Not on file    Highest education level: Not on file   Occupational History    Not on file   Tobacco Use    Smoking status: Former     Current packs/day: 0.50     Average packs/day: 0.5 packs/day for 10.0 years (5.0 ttl pk-yrs)     Types: Cigarettes    Smokeless tobacco: Never   Vaping Use    Vaping status: Never Used   Substance and Sexual Activity    Alcohol use: Not Currently     Alcohol/week: 1.0 standard drink of alcohol     Types: 1 Cans of beer per week     Comment: drank long time ago  not current.    Drug use: Never    Sexual activity: Not on file   Other Topics Concern    Ability to Walk 1 Flight of Steps without SOB/CP No    Routine Exercise Not Asked    Ability to Walk 2 Flight of Steps without SOB/CP Not Asked    Unable to Ambulate Not Asked    Total Care Not Asked    Ability To Do Own ADL's Yes    Uses Walker Not Asked    Other Activity Level Not Asked    Uses Cane Not Asked   Social History Narrative    Not on file     Social Determinants of Health     Financial Resource Strain: Not on file   Transportation Needs: Not on file   Social Connections: Not on file   Intimate Partner Violence: Not on file   Housing Stability: Not on file       Allergies   Allergen Reactions    Pneumococcal 23-Valent Polysaccharide Vaccine  Other Adverse Reaction (Add comment)     Severe local swelling    Tramadol Nausea/ Vomiting     "makes me sick with or without food"    Iohexol  Other Adverse Reaction (Add comment) and Hives/ Urticaria      Desc: HIVES SEVERAL HRS S/P IV CONTRAST.. OK LAST SCAN W/ BENADRYL PREMED @ [facility] '01/AC.    Lisinopril  Other Adverse Reaction (Add comment)     Other reaction(s): Cough    Other Rash     Other reaction(s): Other (See Comments)  Nickle   When teeth were wired in she place, patient obtained an infection and wiring had to be removed,  Also allergic to silver    Crestor [Rosuvastatin]      Muscle aches     Lovastatin     Statin [Atorvastatin]      Muscle ache    Iv Contrast Hives/ Urticaria    Nickel Hives/ Urticaria       Outpatient Medications Marked as Taking for the 01/22/24 encounter [facility] Encounter)   Medication Sig    ALPRAZolam (XANAX) 0.5 mg Oral Tablet Take 1 Tablet (0.5 mg total) by mouth Every evening    amLODIPine (NORVASC) 5 mg Oral Tablet Take 1 Tablet (5 mg total) by mouth Once a day    Cholecalciferol, Vitamin D3, 10 mcg (400 unit) Oral Tablet, Chewable Chew Once a day    clopidogreL (PLAVIX) 75 mg Oral Tablet Take 1 Tablet (75 mg total) by mouth Once a day    diphenhydrAMINE (BENADRYL) 25 mg Oral Capsule Take 1 Capsule (25 mg total) by mouth Three times a day Patient given specific medication instructions for a heart catheterization.    evolocumab (REPATHA SURECLICK) 140 mg/mL Subcutaneous Pen Injector Inject 1 pen (140 mg) subcutaneously (under the skin) once every 14 days    ezetimibe (ZETIA) 10 mg Oral Tablet TAKE 1 TABLET BY MOUTH EVERY DAY IN THE EVENING    famotidine (PEPCID) 20 mg Oral Tablet Take 1 Tablet (20 mg total) by mouth Twice daily Patient given specific medication instructions for a heart catheterization.    fluticasone propionate (FLONASE NASL) by Nasal route    Irbesartan-Hydrochlorothiazide 300-12.5 mg Oral Tablet Take 300 mg by mouth Once a day    Isosorbide Mononitrate (IMDUR) 120 mg Oral Tablet Sustained Release 24 hr Take 1 Tablet (120 mg total) by mouth Every morning    levothyroxine (SYNTHROID) 112 mcg Oral Tablet Take 1  Tablet (112 mcg total) by mouth Every morning    metoprolol succinate (TOPROL-XL) 50 mg Oral Tablet Sustained Release 24 hr Take 1 Tablet (50 mg total) by mouth Once a day    nitroGLYCERIN (NITROSTAT) 0.4 mg Sublingual Tablet, Sublingual PLACE 1 TABLET UNDER TONGUE EVERY 5 MINS, UP TO 3 DOSES AS NEEDED FOR CHEST PAIN    pantoprazole (PROTONIX) 40 mg Oral Tablet, Delayed Release (E.C.) Take 1 Tablet (40 mg total) by mouth Once a day    POTASSIUM-99 ORAL  Take by mouth Once a day    predniSONE (DELTASONE) 20 mg Oral Tablet Take 1 Tablet (20 mg total) by mouth Three times a day Patient given specific medication instructions for a heart catheterization.    ranolazine (RANEXA) 500 mg Oral Tablet Sustained Release 12 hr Take 1 Tablet (500 mg total) by mouth Twice daily    valACYclovir (VALTREX) 500 mg Oral Tablet Take 1 Tablet (500 mg total) by mouth Twice daily    vitamin B complex (B COMPLEX-VITAMIN B12 ORAL) Take by mouth Once a day       Patient Active Problem List   Diagnosis    Angina, class III (CMS HCC)    CAD (coronary artery disease)    History of percutaneous coronary intervention    Type 2 diabetes mellitus (CMS HCC)    Pulmonary HTN (CMS HCC)    HTN (hypertension)    Hyperlipidemia    Hypothyroidism    Unstable angina (CMS HCC)    Coronary artery disease    Syncope    Elevated d-dimer    Thoracic ascending aortic aneurysm (CMS HCC)    Crescendo angina (CMS HCC)       REVIEW OF SYSTEMS:   See HPI above.  Remainder of review of systems negative unless noted above.    PHYSICAL EXAMINATION:  There were no vitals filed for this visit.  Vitals have been reviewed.  Input/Output  No intake or output data in the 24 hours ending 01/22/24 1225  The HEENT exam is negative the neck reveals no JVD the heart is regular with an S4 gallop lungs are clear abdomen is soft extremities have no cyanosis clubbing or edema neurologically she seems intact.    Labs:   CBC with Diff (Last 24 Hours):  No results for input(s): "WBC", "HGB", "HCT", "MCV", "PLTCNT", "BANDS", "PMNS", "LYMPHO", "LYMPHOCYTES", "MONOCYTES", "EOSINO", "EOSINOPHIL", "BASOPHILS" in the last 24 hours.    BMP (Last 24 Hours):  No results for input(s): "SODIUM", "POTASSIUM", "CHLORIDE", "CO2", "BUN", "CREATININE", "CALCIUM", "GLUCOSENF" in the last 24 hours.    Labs have been reviewed extensively.      Radiology:       Assessment  1. Unstable angina     History of previous circumflex angioplasty     History of  stroke    Hypertension     Dyslipidemia     Diabetes    Previous history of iodine allergy     Plan  At this point I think the patient is stable.  She has been pretreated for her dye allergy.  We will plan for left heart catheterization with possible angioplasty to be done here today.  I have reviewed the procedure risks benefits and alternatives with her and her husband they do wish to proceed and we will make further definitive plans when her anatomy is known in the cath lab and they both agree.  Lucious Groves, MD  Lucious Groves, MD

## 2024-01-23 ENCOUNTER — Other Ambulatory Visit: Payer: Self-pay

## 2024-01-28 ENCOUNTER — Other Ambulatory Visit: Payer: Self-pay

## 2024-01-28 ENCOUNTER — Encounter (INDEPENDENT_AMBULATORY_CARE_PROVIDER_SITE_OTHER): Admitting: Cardiovascular Disease

## 2024-01-28 DIAGNOSIS — Z95818 Presence of other cardiac implants and grafts: Secondary | ICD-10-CM

## 2024-01-28 DIAGNOSIS — R55 Syncope and collapse: Secondary | ICD-10-CM

## 2024-01-29 ENCOUNTER — Other Ambulatory Visit: Payer: Self-pay

## 2024-01-29 ENCOUNTER — Other Ambulatory Visit (INDEPENDENT_AMBULATORY_CARE_PROVIDER_SITE_OTHER): Payer: Self-pay | Admitting: Cardiovascular Disease

## 2024-01-31 ENCOUNTER — Other Ambulatory Visit: Payer: Self-pay

## 2024-02-04 ENCOUNTER — Other Ambulatory Visit: Payer: Self-pay

## 2024-02-13 ENCOUNTER — Ambulatory Visit (INDEPENDENT_AMBULATORY_CARE_PROVIDER_SITE_OTHER): Payer: Self-pay | Admitting: OBSTETRICS/GYNECOLOGY

## 2024-02-13 ENCOUNTER — Other Ambulatory Visit: Payer: Self-pay

## 2024-02-13 ENCOUNTER — Encounter (INDEPENDENT_AMBULATORY_CARE_PROVIDER_SITE_OTHER): Payer: Self-pay | Admitting: OBSTETRICS/GYNECOLOGY

## 2024-02-13 VITALS — BP 143/45 | HR 70 | Temp 97.6°F | Ht 62.0 in | Wt 156.0 lb

## 2024-02-13 DIAGNOSIS — R350 Frequency of micturition: Secondary | ICD-10-CM

## 2024-02-13 DIAGNOSIS — Z01411 Encounter for gynecological examination (general) (routine) with abnormal findings: Secondary | ICD-10-CM

## 2024-02-13 DIAGNOSIS — N952 Postmenopausal atrophic vaginitis: Secondary | ICD-10-CM

## 2024-02-13 DIAGNOSIS — N898 Other specified noninflammatory disorders of vagina: Secondary | ICD-10-CM

## 2024-02-13 NOTE — Progress Notes (Signed)
 GYNECOLOGY, [facility]  [address]  [place] [address]       Name: [name] [name] MRN:  [id number]   Date of Birth: Aug 20, 1941 Age: 83 y.o.   Date: 02/13/2024       Provider: Melbourne Abts, MD  PCP: Marko Stai, PA-C  Referring Provider: No ref. provider found     Reason for visit:   Chief Complaint   Patient presents with    Annual Exam     Est care  Recurrent uti        History of Present Illness:  [name] [name] is a 83 y.o. PMP patient (former G2P2000, LMP at age 52 prior to hyst with ovarian preservation for bleeding, never used HT) here to establish gyn care for recurrent UTI's.   She does not always have symptoms when she has a UTI.  She also thinks she may have a vaginal infection.  She is unable to have intercourse due to pain and dryness.    OB/GYN History:  OB History   Gravida Para Term Preterm AB Living   2 2 2       SAB IAB Ectopic Multiple Live Births             # Outcome Date GA Lbr Len/2nd Weight Sex Type Anes PTL Lv   2 Term            1 Term                  Gyn History       LMP: Postmenopausal    Age at Menarche: 43    Age at First Pregnancy: 72    Age at Menopause:     Gyn History Comments:     Sexual Activity: Not Currently; No partner data on record    Contraception: No contraception data on record                    Abnormal Pap Unanswered   STI Unanswered   LEEP Procedure Unanswered   [facility] Biopsy Unanswered        There is no immunization history on file for this patient.   Last mammo: 05/14/2023       No Cervical Cancer Screening results to display.     Past Medical History:  Past Medical History:   Diagnosis Date    Cancer (CMS [facility])     skin cancer -- forehead  2024    Coronary artery disease     CVA (cerebral vascular accident) (CMS HCC) 2009    Dyspnea on exertion     Essential hypertension     Hyperlipidemia     PONV (postoperative nausea and vomiting)     Skin cancer     Stented coronary artery     Type 2 diabetes mellitus (CMS HCC)      Past Surgical  History:   Procedure Laterality Date    CARDIAC CATHETERIZATION      CATARACT EXTRACTION, BILATERAL Bilateral     HX COLONOSCOPY      HX HEART CATHETERIZATION  2017 and 2018    Stented twice  prox circ  and circ    HX PARTIAL HYSTERECTOMY      tubes and ovaries left intact    HX THYROIDECTOMY  1978    HX WISDOM TEETH EXTRACTION      KNEE SURGERY  2018    muscle removed    LAPAROSCOPIC CHOLECYSTECTOMY      RECONSTRUCTION  OF NOSE  1980    SKIN CANCER EXCISION      left side forehead      Allergies   Allergen Reactions    Pneumococcal 23-Valent Polysaccharide Vaccine  Other Adverse Reaction (Add comment)     Severe local swelling    Tramadol Nausea/ Vomiting     "makes me sick with or without food"    Iohexol  Other Adverse Reaction (Add comment) and Hives/ Urticaria      Desc: HIVES SEVERAL HRS S/P IV CONTRAST.. [place] LAST SCAN W/ BENADRYL PREMED @ [facility] '01/AC.    Lisinopril  Other Adverse Reaction (Add comment)     Other reaction(s): Cough    Other Rash     Other reaction(s): Other (See Comments)  Nickle   When teeth were wired in she place, patient obtained an infection and wiring had to be removed,  Also allergic to silver    Crestor [Rosuvastatin]      Muscle aches    Lovastatin     Statin [Atorvastatin]      Muscle ache    Iv Contrast Hives/ Urticaria    Nickel Hives/ Urticaria     Current Outpatient Medications   Medication Sig    ALPRAZolam (XANAX) 0.5 mg Oral Tablet Take 1 Tablet (0.5 mg total) by mouth Every evening    amLODIPine (NORVASC) 5 mg Oral Tablet Take 1 Tablet (5 mg total) by mouth Once a day    bempedoic acid 180 mg Oral Tablet Take 1 Tablet (180 mg total) by mouth Once a day    Cholecalciferol, Vitamin D3, 10 mcg (400 unit) Oral Tablet, Chewable Chew Once a day    clopidogreL (PLAVIX) 75 mg Oral Tablet Take 1 Tablet (75 mg total) by mouth Once a day    empagliflozin (JARDIANCE) 10 mg Oral Tablet Take 1 Tablet (10 mg total) by mouth Once a day    evolocumab (REPATHA SURECLICK) 140 mg/mL Subcutaneous Pen  Injector Inject 1 pen (140 mg) subcutaneously (under the skin) once every 14 days    ezetimibe (ZETIA) 10 mg Oral Tablet TAKE 1 TABLET BY MOUTH EVERY DAY IN THE EVENING    fluticasone propionate (FLONASE NASL) by Nasal route    Irbesartan-Hydrochlorothiazide 300-12.5 mg Oral Tablet Take 300 mg by mouth Once a day    Isosorbide Mononitrate (IMDUR) 120 mg Oral Tablet Sustained Release 24 hr Take 1 Tablet (120 mg total) by mouth Every morning    levothyroxine (SYNTHROID) 112 mcg Oral Tablet Take 1 Tablet (112 mcg total) by mouth Every morning    metoprolol succinate (TOPROL-XL) 50 mg Oral Tablet Sustained Release 24 hr Take 1 Tablet (50 mg total) by mouth Once a day    nitroGLYCERIN (NITROSTAT) 0.4 mg Sublingual Tablet, Sublingual PLACE 1 TABLET UNDER TONGUE EVERY 5 MINS, UP TO 3 DOSES AS NEEDED FOR CHEST PAIN    pantoprazole (PROTONIX) 40 mg Oral Tablet, Delayed Release (E.C.) Take 1 Tablet (40 mg total) by mouth Once a day    POTASSIUM-99 ORAL Take by mouth Once a day    ranolazine (RANEXA) 500 mg Oral Tablet Sustained Release 12 hr Take 1 Tablet (500 mg total) by mouth Twice daily    valACYclovir (VALTREX) 500 mg Oral Tablet Take 1 Tablet (500 mg total) by mouth Twice daily    vitamin B complex (B COMPLEX-VITAMIN B12 ORAL) Take by mouth Once a day     Family History       Problem Relation Age of Onset Comments  Cardiomyopathy Father (Deceased)      Heart Attack Mother (Deceased)      Heart Disease Sister (Alive)      Uterine Cancer Mother (Deceased)  21's or 65's    Uterine Cancer Niece Metallurgist)  20's           Social History     Socioeconomic History    Marital status: Married   Tobacco Use    Smoking status: Former     Current packs/day: 0.50     Average packs/day: 0.5 packs/day for 10.0 years (5.0 ttl pk-yrs)     Types: Cigarettes    Smokeless tobacco: Never   Vaping Use    Vaping status: Never Used   Substance and Sexual Activity    Alcohol use: Not Currently     Alcohol/week: 1.0 standard drink of alcohol      Types: 1 Cans of beer per week     Comment: drank long time ago not current.    Drug use: Never    Sexual activity: Not Currently   Other Topics Concern    Ability to Walk 1 Flight of Steps without SOB/CP No    Ability To Do Own ADL's Yes       Review of Systems:  The pertinent ROS as addressed in the HPI.    Vital Signs:BP (!) 143/45   Pulse 70   Temp 36.4 C (97.6 F) (Oral)   Ht 1.575 m (5\' 2" )   Wt 70.8 kg (156 lb)   BMI 28.53 kg/m       Physical Exam:  Examination:    Well Woman:    General appearance:  alert, oriented, and in no apparent distress   HEENT: head atraumatic and normocephalic, anicteric sclera  Musculoskeletal: Normal gait  Skin: No facial rashes  Breast, right: No suspicious masses or overlying skin changes  Breast, left: No suspicious masses or overlying skin changes  Abdomen:  Soft, non-tender nondistended  External genitals: Normal   Urethra: no prolapse or masses  Bladder: no mass or TTP  Vagina:  Normal, no lesions  Adnexa:  No masses or tenderness palpated   Perineum:  Normal   Cervix:  absent  Uterus:  absent    Assessment:    ICD-10-CM    1. Abnormal gynecological examination  Z01.411       2. Increased urinary frequency  R35.0 URINE ID [facility]      3. 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 Abnormal gynecological examination (Primary)  See below, she no longer needs pap tests after her hyst and her mammograms are ordered by her PCP.    2. Increased urinary frequency  Urethra swabbed with chlorhexadine (she has a betadine allergy) x 3 and cath urine specimen obtained and sent for PCR.  Pt. to call us in 1 week if she hasn't heard her results.  Will send urine for PCR, pt. to call us in one week if she hasn't heard her results.  - URINE ID [facility]; Future    3. Vaginal discharge  Swabs for BV and yeast sent today. If positive for BV (or  indeterminate and she has symptoms) I will treat with oral flagyl 500 mg PO BID x 7 days and oral  diflucan 150 mg PO as one time dose (as long as she has no allergies) as one time dose if positive for yeast. Pt. to call us in 2 weeks if she hasn't heard these results.  - VAGINITIS (F371-0) [facility]; Future    4. Atrophic vaginitis  We will await her urine and vaginal infection results then consider topical estrogen therapy if she wants.       Return in about 6 weeks (around 03/26/2024) for topical estrogen discussion.    Melbourne Abts, MD, FACOG    Portions of this note may be dictated using voice recognition software or a dictation service. Variances in spelling and vocabulary are possible and unintentional. Not all errors are caught/corrected. Please notify the Thereasa Parkin if any discrepancies are noted or if the meaning of any statement is not clear.

## 2024-02-14 ENCOUNTER — Other Ambulatory Visit (HOSPITAL_BASED_OUTPATIENT_CLINIC_OR_DEPARTMENT_OTHER): Payer: Self-pay | Admitting: Medical

## 2024-02-14 DIAGNOSIS — M1711 Unilateral primary osteoarthritis, right knee: Secondary | ICD-10-CM

## 2024-02-17 ENCOUNTER — Ambulatory Visit: Payer: Self-pay | Attending: Medical | Admitting: Medical

## 2024-02-17 ENCOUNTER — Other Ambulatory Visit: Payer: Self-pay

## 2024-02-17 ENCOUNTER — Ambulatory Visit (HOSPITAL_BASED_OUTPATIENT_CLINIC_OR_DEPARTMENT_OTHER)
Admission: RE | Admit: 2024-02-17 | Discharge: 2024-02-17 | Disposition: A | Discharge status: Home / Self Care | Source: Ambulatory Visit | Admitting: Radiology

## 2024-02-17 ENCOUNTER — Other Ambulatory Visit (INDEPENDENT_AMBULATORY_CARE_PROVIDER_SITE_OTHER): Payer: Self-pay

## 2024-02-17 ENCOUNTER — Encounter (HOSPITAL_BASED_OUTPATIENT_CLINIC_OR_DEPARTMENT_OTHER): Payer: Self-pay | Admitting: Medical

## 2024-02-17 VITALS — Ht 62.0 in | Wt 158.7 lb

## 2024-02-17 DIAGNOSIS — M1711 Unilateral primary osteoarthritis, right knee: Secondary | ICD-10-CM | POA: Insufficient documentation

## 2024-02-17 DIAGNOSIS — N898 Other specified noninflammatory disorders of vagina: Secondary | ICD-10-CM

## 2024-02-17 DIAGNOSIS — R350 Frequency of micturition: Secondary | ICD-10-CM

## 2024-02-17 MED ORDER — TRIAMCINOLONE ACETONIDE ER 32 MG INTRA-ARTICULAR SUSPENSION EXT.RELEAS
32.0000 mg | Freq: Once | INTRA_ARTICULAR | Status: AC
Start: 2024-02-17 — End: 2024-02-17
  Administered 2024-02-17: 32 mg via INTRA_ARTICULAR

## 2024-02-17 MED ORDER — BUPIVACAINE HCL 0.5 % (5 MG/ML) INJECTION SOLUTION
10.0000 mL | Freq: Once | INTRAMUSCULAR | Status: AC
Start: 2024-02-17 — End: 2024-02-17
  Administered 2024-02-17: 10 mL via INTRAMUSCULAR

## 2024-02-17 NOTE — Progress Notes (Signed)
 Orthopaedics & Sports Medicine  [address]  [place], [place] [address]  [phone]       OFFICE VISIT    PATIENT NAME:  [name] [name]  MEDICAL RECORD NUMBER: [id number]    DICTATING PHYSICIAN:  Launa Flight, PA-C  REFERRING PHYSICIAN:  Launa Flight, PA-C    DOB:  [birth year]  DOS:  02/17/2024    CHIEF COMPLAINT:  right Knee Pain      HISTORY OF PRESENT ILLNESS:  [name] [name] is a 83 y.o. female.  Comes to the office today for follow up consultation in regards to ongoing right knee pain.  Patient states that this has been ongoing for many years.  she cannot recall one specific injury that caused the pain.  Patient tells me that their symptoms are worse with weight bearing activities and ambulating on uneven ground and downward grades.  she has pain that awakens them from sleeping. In addition to modifying the activities that create pain, she has attempted NSAIDs, tylenol, steroid injections, Visco injections, PRP injections, Zilretta in the right knee. She continues to notice good relief with the Zilretta injections. She has multiple health issues including cardiac problems, h/o stroke.  At this point the patient is here today to discuss further evaluation of their knee pain.     PAST MEDICAL HISTORY:  Past Medical History:   Diagnosis Date    Cancer (CMS [facility])     skin cancer -- forehead  2024    Coronary artery disease     CVA (cerebral vascular accident) (CMS HCC) 2009    Dyspnea on exertion     Essential hypertension     Hyperlipidemia     PONV (postoperative nausea and vomiting)     Skin cancer     Stented coronary artery     Type 2 diabetes mellitus (CMS HCC)            PAST SURGICAL HISTORY:  Past Surgical History:   Procedure Laterality Date    CARDIAC CATHETERIZATION      CATARACT EXTRACTION, BILATERAL Bilateral     HX COLONOSCOPY      HX HEART CATHETERIZATION  2017 and 2018    Stented twice  prox circ  and circ    HX PARTIAL HYSTERECTOMY      tubes and ovaries left intact    HX THYROIDECTOMY  1978    HX  WISDOM TEETH EXTRACTION      KNEE SURGERY  2018    muscle removed    LAPAROSCOPIC CHOLECYSTECTOMY      RECONSTRUCTION OF NOSE  1980    SKIN CANCER EXCISION      left side forehead           MEDICATIONS:  Current Outpatient Medications   Medication Sig    ALPRAZolam (XANAX) 0.5 mg Oral Tablet Take 1 Tablet (0.5 mg total) by mouth Every evening    amLODIPine (NORVASC) 5 mg Oral Tablet Take 1 Tablet (5 mg total) by mouth Once a day    bempedoic acid 180 mg Oral Tablet Take 1 Tablet (180 mg total) by mouth Once a day    Cholecalciferol, Vitamin D3, 10 mcg (400 unit) Oral Tablet, Chewable Chew Once a day    clopidogreL (PLAVIX) 75 mg Oral Tablet Take 1 Tablet (75 mg total) by mouth Daily    empagliflozin (JARDIANCE) 10 mg Oral Tablet Take 1 Tablet (10 mg total) by mouth Once a day    evolocumab (REPATHA SURECLICK) 140 mg/mL Subcutaneous Pen Injector  Inject 1 pen (140 mg) subcutaneously (under the skin) once every 14 days    ezetimibe (ZETIA) 10 mg Oral Tablet TAKE 1 TABLET BY MOUTH EVERY DAY IN THE EVENING    fluticasone propionate (FLONASE NASL) by Nasal route    Irbesartan-Hydrochlorothiazide 300-12.5 mg Oral Tablet Take 300 mg by mouth Once a day    Isosorbide Mononitrate (IMDUR) 120 mg Oral Tablet Sustained Release 24 hr Take 1 Tablet (120 mg total) by mouth Every morning    levothyroxine (SYNTHROID) 112 mcg Oral Tablet Take 1 Tablet (112 mcg total) by mouth Every morning    metoprolol succinate (TOPROL-XL) 50 mg Oral Tablet Sustained Release 24 hr Take 1 Tablet (50 mg total) by mouth Once a day    nitroGLYCERIN (NITROSTAT) 0.4 mg Sublingual Tablet, Sublingual PLACE 1 TABLET UNDER TONGUE EVERY 5 MINS, UP TO 3 DOSES AS NEEDED FOR CHEST PAIN    pantoprazole (PROTONIX) 40 mg Oral Tablet, Delayed Release (E.C.) Take 1 Tablet (40 mg total) by mouth Daily    POTASSIUM-99 ORAL Take by mouth Once a day    ranolazine (RANEXA) 500 mg Oral Tablet Sustained Release 12 hr Take 1 Tablet (500 mg total) by mouth Twice daily     valACYclovir (VALTREX) 500 mg Oral Tablet Take 1 Tablet (500 mg total) by mouth Twice daily    vitamin B complex (B COMPLEX-VITAMIN B12 ORAL) Take by mouth Once a day       ALLERGIES:  Allergy History as of 02/17/24       IV CONTRAST         Noted Status Severity Type Reaction    08/28/19 1339 [place], [place], LPN 78/46/96 Active Low  Hives/ Urticaria              NICKEL         Noted Status Severity Type Reaction    08/28/19 1339 [place], [place], LPN 29/52/84 Active Low  Hives/ Urticaria              ATORVASTATIN         Noted Status Severity Type Reaction    05/24/21 1302 Calain, Amy, RN 05/24/21 Active       Comments: Muscle ache               ROSUVASTATIN         Noted Status Severity Type Reaction    05/24/21 1303 Calain, Amy, RN 05/24/21 Active       Comments: Muscle aches               LOVASTATIN         Noted Status Severity Type Reaction    05/24/21 1303 Lorane Gell, RN 05/24/21 Active                 IOHEXOL         Noted Status Severity Type Reaction    01/18/22 0401 Judi Saa, RN 08/15/05 Active Medium Side Effect  Other Adverse Reaction (Add comment), Hives/ Urticaria    Comments:  Desc: HIVES SEVERAL HRS S/P IV CONTRAST.. [place] LAST SCAN W/ BENADRYL PREMED @ [facility] '01/AC.     07/26/21 1424 Flesher, Mitchel Honour, LPN [birth year] Active Medium  Hives/ Urticaria,  Other Adverse Reaction (Add comment)    Comments:  Desc: HIVES SEVERAL HRS S/P IV CONTRAST.. [place] LAST SCAN W/ BENADRYL PREMED @ [facility] '01/AC.               LISINOPRIL  Noted Status Severity Type Reaction    01/18/22 0402 Judi Saa, RN 05/25/14 Active Medium Side Effect  Other Adverse Reaction (Add comment)    Comments: Other reaction(s): Cough     07/26/21 1424 Maye Hides, LPN 47/82/95 Active Medium      Comments: Other reaction(s): Cough               PNEUMOCOCCAL 23-VALENT POLYSACCHARIDE VACCINE         Noted Status Severity Type Reaction    01/18/22 0401 Judi Saa, RN 05/25/14 Active High Systemic  Other Adverse Reaction (Add comment)     Comments: Severe local swelling     07/26/21 1424 Maye Hides, LPN 62/13/08 Active High   Other Adverse Reaction (Add comment)    Comments: Severe local swelling               TRAMADOL         Noted Status Severity Type Reaction    01/18/22 0401 Judi Saa, RN 10/21/18 Active High Side Effect Nausea/ Vomiting    Comments: "makes me sick with or without food"     07/26/21 1424 Maye Hides, LPN 65/78/46 Active High  Nausea/ Vomiting    Comments: "makes me sick with or without food"               OTHER         Noted Status Severity Type Reaction    01/18/22 0401 Judi Saa, RN 01/12/16 Active Medium Side Effect Rash    Comments: Other reaction(s): Other (See Comments)  Nickle   When teeth were wired in she place, patient obtained an infection and wiring had to be removed,  Also allergic to silver     07/26/21 1425 Maye Hides, LPN 96/29/52 Active Medium  Rash    Comments: Other reaction(s): Other (See Comments)  Nickle   When teeth were wired in she place, patient obtained an infection and wiring had to be removed,  Also allergic to silver                     FAMILY HISTORY:  Family Medical History:       Problem Relation (Age of Onset)    Cardiomyopathy Father    Heart Attack Mother    Heart Disease Sister    Uterine Cancer Mother, Niece                SOCIAL HISTORY:  Social History     Socioeconomic History    Marital status: Married     Spouse name: Not on file    Number of children: Not on file    Years of education: Not on file    Highest education level: Not on file   Occupational History    Not on file   Tobacco Use    Smoking status: Former     Current packs/day: 0.50     Average packs/day: 0.5 packs/day for 10.0 years (5.0 ttl pk-yrs)     Types: Cigarettes    Smokeless tobacco: Never   Vaping Use    Vaping status: Never Used   Substance and Sexual Activity    Alcohol use: Not Currently     Alcohol/week: 1.0 standard drink of alcohol     Types: 1 Cans of beer per week     Comment:  drank long time ago not current.    Drug use: Never    Sexual activity: Not Currently   Other  Topics Concern    Ability to Walk 1 Flight of Steps without SOB/CP No    Routine Exercise Not Asked    Ability to Walk 2 Flight of Steps without SOB/CP Not Asked    Unable to Ambulate Not Asked    Total Care Not Asked    Ability To Do Own ADL's Yes    Uses Walker Not Asked    Other Activity Level Not Asked    Uses Cane Not Asked   Social History Narrative    Not on file     Social Determinants of Health     Financial Resource Strain: Not on file   Transportation Needs: Not on file   Social Connections: Not on file   Intimate Partner Violence: Not on file   Housing Stability: Not on file       REVIEW OF SYSTEMS  Constitutional: negative  Musculoskeletal:positive for Right knee pain  All other ROS Negative    PHYSICAL EXAMINATION:    Ht 1.575 m (5\' 2" )   Wt 72 kg (158 lb 11.7 oz)   BMI 29.03 kg/m         GENERAL: she is alert, cooperative, no distress, appears stated age.      ORTHOPEDIC EVALUATION:    Patient displays an abnormal gait.  Patient favors the rightlower extremity.  Gross inspection reveals normal mechanical and anatomic axis of the lower extremities with no appreciable leg length discrepancy.  No tenderness found with AP or lateral compression testing of the pelvis.    Right hip shows full PROM (Forward flexion 0-115 degrees, internal rotation 0-35 degrees, external rotation 0-45 degrees). McCarthy's Maneuvers are negative for impingement. FABRE testing negative. No tenderness to palpation over the greater trochanter.  Straight leg testing is negative for radiculopathy.  Compartments are supple.  Skin is clear.  +5/5 hip flexors/extenders, as well as, knee flexors and extenders.  Intact sensory exam along all dermatomes.    Left hip shows full PROM (Forward flexion 0-115 degrees, internal rotation 0-35 degrees, external rotation 0-45 degrees). McCarthy's Maneuvers are negative for impingement. FABRE testing  negative. No tenderness to palpation over the greater trochanter.  Straight leg testing is negative for radiculopathy.  Compartments are supple.  Skin is clear.  +5/5 hip flexors/extenders, as well as, knee flexors and extenders.  Intact sensory exam along all dermatomes.    Right knee shows full PROM (0-120 degrees).  Patella tracks midline without instability.  Varus deformity.  Moderate tenderness to palpation of the distal femur and proximal tibia. Clark's patella-femoral test is positivefor crepitus and pain.  No effusion is present.  Lachman's test is negative.  Posterior Drawer test is negative.  Collateral ligamental testing is stable at 30 degrees of extension, mid-flexion, and terminal flexion.  McMurrary testing is negative.  No Baker's cyst is present.  Skin is clear.  Normoreflexic.   Left knee shows full PROM (0-120 degrees).  Patella tracks midline without instability.  Varus deformity.  Mild tenderness to palpation of the distal femur and proximal tibia. Clark's patella-femoral test is positivefor crepitus and pain.  No effusion is present.  Lachman's test is negative.  Posterior Drawer test is negative.  Collateral ligamental testing is stable at 30 degrees of extension, mid-flexion, and terminal flexion.  McMurrary testing is negative.  No Baker's cyst is present.  Skin is clear.  Normoreflexic.    Right Ankle shows full PROM in plantar and dorsiflexion.  Stable to ligamental testing.  Skin is clear. +5/5 motor  strength to plantar/dorsiflexion, inversion/eversion, and EHL.  Intact sensory to light-touch.  +2/4 dorsalis pedis and posterior tibial pulse.    Left Ankle shows full PROM in plantar and dorsiflexion.  Stable to ligamental testing.  Skin is clear. +5/5 motor strength to plantar/dorsiflexion, inversion/eversion, and EHL.  Intact sensory to light-touch.  +2/4 dorsalis pedis and posterior tibial pulse.          IMAGING:    XR KNEE RIGHT 4 OR MORE VIEWS performed on 02/17/2024 1:30 PM.      REASON FOR EXAM:  M17.11: Primary osteoarthritis of right knee     TECHNIQUE: 4 views/4 images submitted for interpretation.     COMPARISON:  08/15/2023.     FINDINGS:  Complete joint space loss in the medial compartment. Tricompartmental osteophytes noted. No acute osseous abnormality identified. Small joint effusion. Similar degenerative changes in the left knee.     IMPRESSION:  Degenerative arthrosis, advanced in the medial compartment.            IMPRESSION:      ICD-10-CM    1. Primary osteoarthritis of right knee  M17.11 Zilretta Joint Injection Schedule/Auth          PLAN:      At this point I have discussed with the patient today my evaluation and recommended treatment alternatives.  We have discussed the pros and cons of both conservative, as well as, surgical intervention.  After discussion we are going to proceed with surgical management.  I have contacted our nurse navigator Julius Bowels and she will contact the patient to discuss surgery dates. She states that in the fall would be best for her. A preoperative appointment will be scheduled 3 weeks prior to surgery.   I discussed with the patient the procedure for total knee replacement surgery under MAKOplasty guidance.  We discussed the utilization of the CT scan and computer assisted cutting guides in order to perform total knee replacement surgery.  I also discussed with patient the fact that he will also have to additional pin holes for the MAKOplasty guidance devices.  Instructions to follow up with their primary care provider, Marko Stai, PA-C and her cardiologist.  A preoperative clearance form has been given to be completed and returned within a month of surgery.  Question were answered.      In addition, injection management will be initiated with Zilretta today.       PROCEDURE:   I reviewed with the patient the risks, benefits, expected outcomes and potential complications of undergoing injection management today.  Patient has given consent  and a signed copy has been placed on the chart.    Procedure:  The right knee was injected with 1 until vial of Zilretta.  All sterile techniques were utilized today. she will follow up with [place] on an as needed basis.       This note was partially generated using MModal Fluency Direct system, and there may be some incorrect words, spellings, and punctuation that were not noted in checking the note before saving.    I am scribing for, and in the presence of, Launa Flight, MMS, PA-C for services provided on 02/17/2024.  Darlen Round, MA, SCRIBE      I personally performed the services described in this documentation, as scribed in my presence, and it is both accurate and complete.  I have reviewed the note and made changes where needed.  I agree with the plan as above.    Launa Flight, PA-C  02/17/2024        [facility] - Orthopaedics & Sports Medicine  [facility]

## 2024-02-18 ENCOUNTER — Ambulatory Visit (INDEPENDENT_AMBULATORY_CARE_PROVIDER_SITE_OTHER): Payer: Self-pay | Admitting: OBSTETRICS/GYNECOLOGY

## 2024-02-26 ENCOUNTER — Other Ambulatory Visit (INDEPENDENT_AMBULATORY_CARE_PROVIDER_SITE_OTHER): Payer: Self-pay | Admitting: Internal Medicine

## 2024-02-28 ENCOUNTER — Encounter (INDEPENDENT_AMBULATORY_CARE_PROVIDER_SITE_OTHER): Admitting: Internal Medicine

## 2024-02-28 ENCOUNTER — Other Ambulatory Visit (INDEPENDENT_AMBULATORY_CARE_PROVIDER_SITE_OTHER): Payer: Self-pay | Admitting: Internal Medicine

## 2024-03-04 ENCOUNTER — Telehealth (HOSPITAL_BASED_OUTPATIENT_CLINIC_OR_DEPARTMENT_OTHER): Payer: Self-pay

## 2024-03-04 NOTE — Telephone Encounter (Addendum)
 Patient called back after talking to her husband, and they decided that march or after would be [place] for [place].  Moved her surgery to 03/02/25 with her pre-op on 02/04/25.    Rodell Citrin, RN      Called patient and let her know that we have her scheduled for a RTKA on 12/15/24 and pre-op on 11/19/24. Reminded her that she will need to see her PCP within 30 days of surgery and cardiologist within 90 days of surgery.  All information mailed to patient.     I did offer her a date in November but she remembered that her grandsons' graduation is in December in [place].    Rodell Citrin, RN           ----- Message from [employer], [place] sent at 02/17/2024  2:38 PM EDT -----  Austine Lefort saw her today and signed her up for a right knee. PCP and heart clearance. She states that the fall time works best for her. Can you please call her with a date.     Thanks  Melvin Staff, [place]

## 2024-03-19 ENCOUNTER — Other Ambulatory Visit: Payer: Self-pay

## 2024-03-24 ENCOUNTER — Encounter (INDEPENDENT_AMBULATORY_CARE_PROVIDER_SITE_OTHER): Payer: Self-pay | Admitting: NURSE PRACTITIONER, FAMILY

## 2024-03-24 ENCOUNTER — Other Ambulatory Visit: Payer: Self-pay

## 2024-03-24 ENCOUNTER — Ambulatory Visit (INDEPENDENT_AMBULATORY_CARE_PROVIDER_SITE_OTHER): Payer: Self-pay | Admitting: NURSE PRACTITIONER, FAMILY

## 2024-03-24 VITALS — BP 154/77 | HR 67 | Ht 62.0 in | Wt 158.0 lb

## 2024-03-24 DIAGNOSIS — I10 Essential (primary) hypertension: Secondary | ICD-10-CM

## 2024-03-24 DIAGNOSIS — Z95820 Peripheral vascular angioplasty status with implants and grafts: Secondary | ICD-10-CM

## 2024-03-24 DIAGNOSIS — I2510 Atherosclerotic heart disease of native coronary artery without angina pectoris: Secondary | ICD-10-CM

## 2024-03-24 DIAGNOSIS — R0600 Dyspnea, unspecified: Secondary | ICD-10-CM

## 2024-03-24 NOTE — Nursing Note (Signed)
 Pt did not have a med list with them at visit - went over list with patient   Advised patient to check AVS list with medicines at home and call if any discrepancies    -Confirmed correct pharmacy with patient for e-scribing   [facility], RN 03/24/2024 11:11

## 2024-03-24 NOTE — Progress Notes (Unsigned)
 CARDIOLOGY [facility], MEDICAL OFFICE BUILDING  [address]  [place] [address]    Progress Note    Encounter Date: 03/24/2024    Patient ID:  [name] [name]  BMW:[id number]    DOB: Apr 17, 1941  Age: 83 y.o. female  Subjective   Subjective:     Chief Complaint   Patient presents with    Follow Up 6 Months    Shortness of Breath     HPI    [name] [name] is a pleasant 84 y.o. female presenting for follow up. She has a PMH of CAD s/p PCI to the LCx x 2 and more recently IVUS guided PCI on 01/19/2022 to the mid LCx x1 that was overlapped proximally with previously placed stent for unstable angina, HTN, HLD and DMT2. A TTE showed preserved EF at 68.5% and no significant valve disease. She used to follow with Dr. Jodee Mulling for lipid management and remains on Repatha  140 mg every two weeks and Zetia  10 mg once daily. In April of 2023, she had syncope while at church. 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 She's following with vascular now and saw Dr. Jeffrey Mini in 07/2022. 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 A 14 day Holter monitor in 05/2022 showed sinus rhythm with no Afib, VT, heart block or pauses. Average heart rate was 65bpm. Her most recent transmission from her loop recorder showed bradycardic readings, however her monitor is undersensing, and likely those are inaccurate readings.     She had a cardiac catheterization completed in February 2025, which showed widely patent stents with mild diffuse disease elsewhere.  Current Outpatient Medications   Medication Sig    ALPRAZolam  (XANAX ) 0.5 mg Oral Tablet Take 1 Tablet (0.5 mg total) by mouth Every evening     amLODIPine  (NORVASC ) 5 mg Oral Tablet Take 1 Tablet (5 mg total) by mouth Once a day    bempedoic acid  180 mg Oral Tablet Take 1 Tablet (180 mg total) by mouth Once a day    Cholecalciferol, Vitamin D3, 10 mcg (400 unit) Oral Tablet, Chewable Chew Once a day    clopidogreL  (PLAVIX ) 75 mg Oral Tablet Take 1 Tablet (75 mg total) by mouth Daily    empagliflozin  (JARDIANCE ) 10 mg Oral Tablet Take 1 Tablet (10 mg total) by mouth Once a day    evolocumab  (REPATHA  SURECLICK) 140 mg/mL Subcutaneous Pen Injector Inject 1 pen (140 mg) subcutaneously (under the skin) once every 14 days    ezetimibe  (ZETIA ) 10 mg Oral Tablet TAKE 1 TABLET BY MOUTH EVERY DAY IN THE EVENING    fluticasone  propionate (FLONASE  NASL) by Nasal route    Irbesartan-Hydrochlorothiazide  300-12.5 mg Oral Tablet Take 300 mg by mouth Once a day    Isosorbide  Mononitrate (IMDUR ) 120 mg Oral Tablet Sustained Release 24 hr Take 1 Tablet (120 mg total) by mouth Every morning    levothyroxine  (SYNTHROID ) 112 mcg Oral Tablet Take 1 Tablet (112 mcg total) by mouth Every morning    metoprolol  succinate (TOPROL -XL) 50 mg Oral Tablet Sustained Release 24 hr Take 1 Tablet (50 mg total) by mouth Once a day    nitroGLYCERIN  (  NITROSTAT ) 0.4 mg Sublingual Tablet, Sublingual PLACE 1 TABLET UNDER TONGUE EVERY 5 MINS, UP TO 3 DOSES AS NEEDED FOR CHEST PAIN    pantoprazole  (PROTONIX ) 40 mg Oral Tablet, Delayed Release (E.C.) Take 1 Tablet (40 mg total) by mouth Daily    POTASSIUM-99 ORAL Take by mouth Once a day    ranolazine  (RANEXA ) 500 mg Oral Tablet Sustained Release 12 hr Take 1 Tablet (500 mg total) by mouth Twice daily    valACYclovir (VALTREX) 500 mg Oral Tablet Take 1 Tablet (500 mg total) by mouth Twice daily    vitamin B complex (B COMPLEX-VITAMIN B12 ORAL) Take by mouth Once a day     Allergies   Allergen Reactions    Pneumococcal 23-Valent Polysaccharide Vaccine  Other Adverse Reaction (Add comment)     Severe local swelling    Tramadol Nausea/ Vomiting      "makes me sick with or without food"    Iohexol  Other Adverse Reaction (Add comment) and Hives/ Urticaria      Desc: HIVES SEVERAL HRS S/P IV CONTRAST.. [place] LAST SCAN W/ BENADRYL  PREMED @ [facility] '01/AC.    Lisinopril  Other Adverse Reaction (Add comment)     Other reaction(s): Cough    Other Rash     Other reaction(s): Other (See Comments)  Nickle   When teeth were wired in she place, patient obtained an infection and wiring had to be removed,  Also allergic to silver    Crestor [Rosuvastatin]      Muscle aches    Lovastatin     Statin [Atorvastatin]      Muscle ache    Iv Contrast Hives/ Urticaria    Nickel Hives/ Urticaria     Past Medical History:   Diagnosis Date    Cancer (CMS HCC)     skin cancer -- forehead  2024    Coronary artery disease     CVA (cerebral vascular accident) 2009    Dyspnea on exertion     Essential hypertension     Hyperlipidemia     PONV (postoperative nausea and vomiting)     Skin cancer     Stented coronary artery     Type 2 diabetes mellitus          Past Surgical History:   Procedure Laterality Date    CARDIAC CATHETERIZATION      CATARACT EXTRACTION, BILATERAL Bilateral     HX COLONOSCOPY      HX HEART CATHETERIZATION  2017 and 2018    Stented twice  prox circ  and circ    HX PARTIAL HYSTERECTOMY      tubes and ovaries left intact    HX THYROIDECTOMY  1978    HX WISDOM TEETH EXTRACTION      KNEE SURGERY  2018    muscle removed    LAPAROSCOPIC CHOLECYSTECTOMY      RECONSTRUCTION OF NOSE  1980    SKIN CANCER EXCISION      left side forehead         Family Medical History:       Problem Relation (Age of Onset)    Cardiomyopathy Father    Heart Attack Mother    Heart Disease Sister    Uterine Cancer Mother, Niece            Social History     Tobacco Use    Smoking status: Former     Current packs/day: 0.50     Average packs/day: 0.5  packs/day for 10.0 years (5.0 ttl pk-yrs)     Types: Cigarettes    Smokeless tobacco: Never   Vaping Use    Vaping status: Never Used   Substance Use Topics     Alcohol use: Not Currently     Alcohol/week: 1.0 standard drink of alcohol     Types: 1 Cans of beer per week     Comment: drank long time ago not current.    Drug use: Never       Review of Systems     Objective   Objective:   Vitals: BP (!) 154/77   Pulse 67   Ht 1.575 m (5\' 2" )   Wt 71.7 kg (158 lb)   SpO2 99%   BMI 28.90 kg/m         Physical Exam     Assessment & Plan:   No diagnosis found.    No orders of the defined types were placed in this encounter.      No follow-ups on file.    Arvell Birchwood, NP

## 2024-03-25 ENCOUNTER — Encounter (INDEPENDENT_AMBULATORY_CARE_PROVIDER_SITE_OTHER): Payer: Self-pay | Admitting: NURSE PRACTITIONER, FAMILY

## 2024-03-30 ENCOUNTER — Encounter (INDEPENDENT_AMBULATORY_CARE_PROVIDER_SITE_OTHER): Payer: Self-pay | Admitting: Internal Medicine

## 2024-03-30 DIAGNOSIS — R55 Syncope and collapse: Secondary | ICD-10-CM

## 2024-03-30 DIAGNOSIS — Z95818 Presence of other cardiac implants and grafts: Secondary | ICD-10-CM

## 2024-03-31 ENCOUNTER — Other Ambulatory Visit (INDEPENDENT_AMBULATORY_CARE_PROVIDER_SITE_OTHER): Payer: Self-pay | Admitting: Internal Medicine

## 2024-04-15 ENCOUNTER — Ambulatory Visit (INDEPENDENT_AMBULATORY_CARE_PROVIDER_SITE_OTHER): Payer: Self-pay | Admitting: OBSTETRICS/GYNECOLOGY

## 2024-04-15 ENCOUNTER — Other Ambulatory Visit: Payer: Self-pay

## 2024-04-15 ENCOUNTER — Encounter (INDEPENDENT_AMBULATORY_CARE_PROVIDER_SITE_OTHER): Payer: Self-pay | Admitting: OBSTETRICS/GYNECOLOGY

## 2024-04-15 VITALS — BP 102/36 | HR 77 | Temp 97.8°F | Ht 62.0 in | Wt 159.6 lb

## 2024-04-15 DIAGNOSIS — N952 Postmenopausal atrophic vaginitis: Secondary | ICD-10-CM

## 2024-04-15 MED ORDER — ESTRADIOL 0.01% (0.1 MG/GRAM) VAGINAL CREAM
TOPICAL_CREAM | VAGINAL | 1 refills | Status: AC
Start: 2024-04-15 — End: 2024-06-17

## 2024-04-15 MED ORDER — ESTRADIOL 0.01% (0.1 MG/GRAM) VAGINAL CREAM
TOPICAL_CREAM | VAGINAL | 1 refills | Status: DC
Start: 2024-04-15 — End: 2024-04-15

## 2024-04-15 NOTE — Progress Notes (Signed)
 GYNECOLOGY, [facility]  [address]  [place] [address]       Name: [name] [name] MRN:  [id number]   Date of Birth: [birth year] Age: 83 y.o.   Date: 04/15/2024       Provider: Jacquelyn Matt, MD  PCP: Audrene Lease, PA-C  Referring Provider: No ref. provider found     Reason for visit:   Chief Complaint   Patient presents with    Follow Up     Patient states she is here for follow up from UTI symptoms, she said all the symptoms have went away so she does not want an exam today.         History of Present Illness:  [name] [name] is a 84 y.o. PMP patient (former G2P2000, LMP at age 88 prior to hyst with ovarian preservation for bleeding, never used HT) here for f/u on her UTI symptoms.   She had a cath urine specimen and vaginal swabs done on 02/13/2024 which returned negative and she no longer has urinary symptoms.  She is unable to have intercourse due to dryness and even with an OTC lubricant she is unable to have intercourse without pain.  She wants something for that pain.    From her 02/13/2024 encounter with me:  [name] [name] is a 83 y.o. PMP patient (former G2P2000, LMP at age 6 prior to hyst with ovarian preservation for bleeding, never used HT) here to establish gyn care for recurrent UTI's.   She does not always have symptoms when she has a UTI.  She also thinks she may have a vaginal infection.  She is unable to have intercourse due to pain and dryness.    OB/GYN History:  OB History   Gravida Para Term Preterm AB Living   2 2 2       SAB IAB Ectopic Multiple Live Births             # Outcome Date GA Lbr Len/2nd Weight Sex Type Anes PTL Lv   2 Term            1 Term                  Gyn History       LMP: Postmenopausal    Age at Menarche: 48    Age at First Pregnancy: 17    Age at Menopause: 61    Gyn History Comments:     Sexual Activity: Not Currently; No partner data on record    Contraception: No contraception data on record                    Abnormal Pap Unanswered   STI  Unanswered   LEEP Procedure Unanswered   [facility] Biopsy Unanswered        There is no immunization history on file for this patient.   Last mammo: 05/14/2023         No Cervical Cancer Screening results to display.     Past Medical History:  Past Medical History:   Diagnosis Date    Cancer (CMS [facility])     skin cancer -- forehead  2024    Coronary artery disease     CVA (cerebral vascular accident) 2009    Dyspnea on exertion     Essential hypertension     Hyperlipidemia     PONV (postoperative nausea and vomiting)     Skin cancer     Stented coronary artery  Type 2 diabetes mellitus      Past Surgical History:   Procedure Laterality Date    CARDIAC CATHETERIZATION      CATARACT EXTRACTION, BILATERAL Bilateral     HX COLONOSCOPY      HX HEART CATHETERIZATION  2017 and 2018    Stented twice  prox circ  and circ    HX PARTIAL HYSTERECTOMY      tubes and ovaries left intact    HX THYROIDECTOMY  1978    HX WISDOM TEETH EXTRACTION      KNEE SURGERY  2018    muscle removed    LAPAROSCOPIC CHOLECYSTECTOMY      RECONSTRUCTION OF NOSE  1980    SKIN CANCER EXCISION      left side forehead      Allergies   Allergen Reactions    Pneumococcal 23-Valent Polysaccharide Vaccine  Other Adverse Reaction (Add comment)     Severe local swelling    Tramadol Nausea/ Vomiting     "makes me sick with or without food"    Iohexol  Other Adverse Reaction (Add comment) and Hives/ Urticaria      Desc: HIVES SEVERAL HRS S/P IV CONTRAST.. [place] LAST SCAN W/ BENADRYL  PREMED @ [facility] '01/AC.    Lisinopril  Other Adverse Reaction (Add comment)     Other reaction(s): Cough    Other Rash     Other reaction(s): Other (See Comments)  Nickle   When teeth were wired in she place, patient obtained an infection and wiring had to be removed,  Also allergic to silver    Crestor [Rosuvastatin]      Muscle aches    Lovastatin     Statin [Atorvastatin]      Muscle ache    Iv Contrast Hives/ Urticaria    Nickel Hives/ Urticaria     Current Outpatient Medications    Medication Sig    ALPRAZolam  (XANAX ) 0.5 mg Oral Tablet Take 1 Tablet (0.5 mg total) by mouth Every evening    amLODIPine  (NORVASC ) 5 mg Oral Tablet Take 1 Tablet (5 mg total) by mouth Once a day    bempedoic acid  180 mg Oral Tablet Take 1 Tablet (180 mg total) by mouth Once a day    Cholecalciferol, Vitamin D3, 10 mcg (400 unit) Oral Tablet, Chewable Chew Once a day    clopidogreL  (PLAVIX ) 75 mg Oral Tablet Take 1 Tablet (75 mg total) by mouth Daily    empagliflozin  (JARDIANCE ) 10 mg Oral Tablet Take 1 Tablet (10 mg total) by mouth Once a day    estradioL (ESTRACE) 0.01 % (0.1 mg/gram) Vaginal Cream Insert 2 grams every other night x 2 weeks then insert 2 grams twice weekly for maintenance    evolocumab  (REPATHA  SURECLICK) 140 mg/mL Subcutaneous Pen Injector Inject 1 pen (140 mg) subcutaneously (under the skin) once every 14 days    ezetimibe  (ZETIA ) 10 mg Oral Tablet TAKE 1 TABLET BY MOUTH EVERY DAY IN THE EVENING    fluticasone  propionate (FLONASE  NASL) by Nasal route    Irbesartan-Hydrochlorothiazide  300-12.5 mg Oral Tablet Take 300 mg by mouth Once a day    Isosorbide  Mononitrate (IMDUR ) 120 mg Oral Tablet Sustained Release 24 hr Take 1 Tablet (120 mg total) by mouth Every morning    levothyroxine  (SYNTHROID ) 112 mcg Oral Tablet Take 1 Tablet (112 mcg total) by mouth Every morning    metoprolol  succinate (TOPROL -XL) 50 mg Oral Tablet Sustained Release 24 hr Take 1 Tablet (50 mg total) by  mouth Once a day    nitroGLYCERIN  (NITROSTAT ) 0.4 mg Sublingual Tablet, Sublingual PLACE 1 TABLET UNDER TONGUE EVERY 5 MINS, UP TO 3 DOSES AS NEEDED FOR CHEST PAIN    pantoprazole  (PROTONIX ) 40 mg Oral Tablet, Delayed Release (E.C.) Take 1 Tablet (40 mg total) by mouth Daily    POTASSIUM-99 ORAL Take by mouth Once a day    ranolazine  (RANEXA ) 500 mg Oral Tablet Sustained Release 12 hr Take 1 Tablet (500 mg total) by mouth Twice daily    valACYclovir (VALTREX) 500 mg Oral Tablet Take 1 Tablet (500 mg total) by mouth Twice  daily    vitamin B complex (B COMPLEX-VITAMIN B12 ORAL) Take by mouth Once a day     Family History       Problem Relation Age of Onset Comments    Cardiomyopathy Father (Deceased)      Heart Attack Mother (Deceased)      Heart Disease Sister Metallurgist)      Uterine Cancer Mother (Deceased)  56's or 67's    Uterine Cancer Niece Metallurgist)  20's           Social History     Socioeconomic History    Marital status: Married   Tobacco Use    Smoking status: Former     Current packs/day: 0.50     Average packs/day: 0.5 packs/day for 10.0 years (5.0 ttl pk-yrs)     Types: Cigarettes    Smokeless tobacco: Never   Vaping Use    Vaping status: Never Used   Substance and Sexual Activity    Alcohol use: Not Currently     Alcohol/week: 1.0 standard drink of alcohol     Types: 1 Cans of beer per week     Comment: drank long time ago not current.    Drug use: Never    Sexual activity: Not Currently   Other Topics Concern    Ability to Walk 1 Flight of Steps without SOB/CP No    Ability To Do Own ADL's Yes       Review of Systems:  The pertinent ROS as addressed in the HPI.    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 Atrophic vaginitis (Primary)  Pt. offered topical estrogen therapy for her atrophy and she wants to try it.  Pt. counseled to use this as treatment, NOT as lubricant, and to still use OTC silicone-based lubricants prior to intercourse.  Pt. counseled about risks of  vaginal discharge and breast tenderness.  She was also instructed to put it in at night before going to bed to minimize how much comes out.     Return in about 6 weeks (around 05/27/2024) for Estrace f/u.  Jacquelyn Matt, MD, FACOG    Portions of this note may be dictated using voice recognition software or a dictation service. Variances in spelling and vocabulary are possible and unintentional. Not all errors are caught/corrected. Please notify the [facility] if any discrepancies are noted or if the meaning of any statement is not clear.

## 2024-04-24 ENCOUNTER — Other Ambulatory Visit: Payer: Self-pay

## 2024-04-24 ENCOUNTER — Other Ambulatory Visit: Payer: Self-pay | Admitting: Pharmacist

## 2024-04-24 NOTE — Telephone Encounter (Signed)
 Specialty Pharmacy Follow Up Note    Date of assessment: 04/24/2024  Patient: [name] [name] is a 83 y.o. female  Diagnosis: HLD w/ known ASCVD  Specialty Medication: Repatha  SureClick 140 mg/ml - Inject 1 pen (140 mg) subcutaneously (under the skin) once every 14 days  Prescriber: Nestor, Ashley M, PA-C  Initiation of therapy: September 2022      Subjective:  Spoke with patient for yearly follow-up regarding Repatha .    Disease state: Hyperlipidemia:  ASCVD: Yes  Additional risk factors: CAD s/p PCI, T2DM, HTN  CV-related hospitalizations/ED visits during the last 12 months: Yes. Patient has had ongoing SOB episodes. Provider is aware and patient pending follow up Echo.  Patient reported goal includes: LDL < 55 (states she is not at this goal yet, but was told she is getting closer as most recent lipid panel has been outside of [name])   Side effects: Patient reports mild injection site reaction which self resolves shortly after administration.   Adherence: Reports 1 missed doses due to being sick and held the dose altogether.  Reports using calendar and routine as a method of adherence. Patient took last dose 05/15 and has 1 left on hand.   Medication list reported changes: none     Objective:  Pertinent labs and monitoring parameters include:   Lab Results   Component Value Date    CHOLESTEROL 171 01/19/2022    HDLCHOL 51 01/19/2022    LDLCHOL 94 01/19/2022    LDLCHOLDIR 130 11/15/2021    TRIG 147 01/19/2022          Assessment/Plan:  Disease state: medication appropriate to continue with therapeutic goal of: LDL < 55. Reviewed medication purpose, when to contact the pharmacy, when to contact the clinic, when to seek appropriate care, and disease state. Patient declined review of administration and storage/handling. Reviewed goals of therapy and confirmed patient has had more recent lipid panel than 2023 done with outside health system.  Side effects: experiencing but tolerable continue to monitor   Adherence: Patient  report of medication on hand and fill records demonstrate adherence. . Reminded patient of the importance of adherence and reviewed missed dose instructions.   Medication list: Patient denies full medication review today. States no updates or changes. Continues ezetimibe  10 mg daily and bempedoic acid  180 mg daily along with Repatha  injections.  Notable drug-interactions or contraindications: None.  Scheduled delivery of Repatha  for Friday, 05/30. Scheduled pharmacist follow up for 1 year or sooner as needed.     Patient expressed no additional questions and was encouraged to contact [facility] with any questions or concerns.     Aletha Hutching, PHARMD 04/24/2024, 12:02

## 2024-04-28 ENCOUNTER — Ambulatory Visit (INDEPENDENT_AMBULATORY_CARE_PROVIDER_SITE_OTHER): Payer: Self-pay

## 2024-04-28 ENCOUNTER — Other Ambulatory Visit (INDEPENDENT_AMBULATORY_CARE_PROVIDER_SITE_OTHER): Payer: Self-pay | Admitting: Internal Medicine

## 2024-04-28 ENCOUNTER — Other Ambulatory Visit: Payer: Self-pay

## 2024-04-28 ENCOUNTER — Encounter (INDEPENDENT_AMBULATORY_CARE_PROVIDER_SITE_OTHER): Payer: Self-pay

## 2024-04-28 VITALS — BP 118/80 | Temp 97.8°F | Ht 62.0 in | Wt 159.0 lb

## 2024-04-28 DIAGNOSIS — I7121 Aneurysm of the ascending aorta, without rupture: Secondary | ICD-10-CM

## 2024-04-28 DIAGNOSIS — E039 Hypothyroidism, unspecified: Secondary | ICD-10-CM

## 2024-04-28 DIAGNOSIS — S46011A Strain of muscle(s) and tendon(s) of the rotator cuff of right shoulder, initial encounter: Secondary | ICD-10-CM | POA: Insufficient documentation

## 2024-04-28 DIAGNOSIS — M19011 Primary osteoarthritis, right shoulder: Secondary | ICD-10-CM | POA: Insufficient documentation

## 2024-04-28 DIAGNOSIS — I2510 Atherosclerotic heart disease of native coronary artery without angina pectoris: Secondary | ICD-10-CM

## 2024-04-28 DIAGNOSIS — I272 Pulmonary hypertension, unspecified: Secondary | ICD-10-CM

## 2024-04-28 DIAGNOSIS — M7541 Impingement syndrome of right shoulder: Secondary | ICD-10-CM | POA: Insufficient documentation

## 2024-04-28 DIAGNOSIS — I10 Essential (primary) hypertension: Secondary | ICD-10-CM

## 2024-04-28 NOTE — Nursing Note (Signed)
 Review of Systems :    Constitutional: no fevers, no chills, no weight loss  Eyes: no double vision, no blurred vision  Ears: no hearing loss  Respiratory: no cough, no shortness of breath, no home oxygen   Cardiovascular: no chest pain, no palpitation  Gastrointestinal: no diarrhea, no abdominal pain   Genitourinary: no urinary incontinence   Integumentary: no rashes, no itching  Neurological: no dizziness, no headache, no tremor   Musculoskeletal: no back pain, assistive device no, no muscle weakness, no muscle aches  Comorbid conditions:  no diabetes, no neuropathy, no venous insufficiency

## 2024-04-28 NOTE — Progress Notes (Signed)
 [facility]  [address] [place] [address]      Name: [name] [name] MRN:  [id number]   Date: 04/28/2024 DOB:  [birth year] (83 y.o.)        Chief Complaint:  Shoulder Pain (Right shoulder)      History of Present Illness:  Right shoulder:  83 year old right-hand dominant female presents complaining of injury to her right shoulder.  She reports on 03 March 2024 falling off of a couch and landing on the floor impacting her right shoulder.  After the fall she had increased pain about the superior and lateral shoulder.  The pain could radiate down to her hand but this has resolved.  Overall the pain has significantly improved with time.  She denies any numbness or tingling distally.  She reports some chronic neck pain but this has not changed in his relatively mild currently.  The pain is worse with raising her arm and trying to do overhead activities and using her arm and at night.  Heat seems to help some.  Tylenol  seems to help some.  She has not done physical therapy on the shoulder.  She has not had previous injections in the shoulder.  She denies fevers, chills, shortness of breath, chest pain.    Past Medical History:  Past Medical History:   Diagnosis Date    Cancer (CMS [facility])     skin cancer -- forehead  2024    Coronary artery disease     CVA (cerebral vascular accident) 2009    Dyspnea on exertion     Essential hypertension     Hyperlipidemia     PONV (postoperative nausea and vomiting)     Skin cancer     Stented coronary artery     Type 2 diabetes mellitus           Past Surgical History:  Past Surgical History:   Procedure Laterality Date    CARDIAC CATHETERIZATION      CATARACT EXTRACTION, BILATERAL Bilateral     HX COLONOSCOPY      HX HEART CATHETERIZATION  2017 and 2018    Stented twice  prox circ  and circ    HX PARTIAL HYSTERECTOMY      tubes and ovaries left intact    HX THYROIDECTOMY  1978    HX WISDOM TEETH EXTRACTION      KNEE SURGERY  2018    muscle removed    LAPAROSCOPIC  CHOLECYSTECTOMY      RECONSTRUCTION OF NOSE  1980    SKIN CANCER EXCISION      left side forehead         Medications:  Current Outpatient Medications   Medication Sig    ALPRAZolam  (XANAX ) 0.5 mg Oral Tablet Take 1 Tablet (0.5 mg total) by mouth Every evening    amLODIPine  (NORVASC ) 5 mg Oral Tablet Take 1 Tablet (5 mg total) by mouth Once a day    bempedoic acid  180 mg Oral Tablet Take 1 Tablet (180 mg total) by mouth Once a day    Cholecalciferol, Vitamin D3, 10 mcg (400 unit) Oral Tablet, Chewable Chew Once a day    clopidogreL  (PLAVIX ) 75 mg Oral Tablet Take 1 Tablet (75 mg total) by mouth Daily    empagliflozin  (JARDIANCE ) 10 mg Oral Tablet Take 1 Tablet (10 mg total) by mouth Once a day    estradioL  (ESTRACE ) 0.01 % (0.1 mg/gram) Vaginal Cream Insert 2 grams every other night x 2 weeks  then insert 2 grams twice weekly for maintenance    evolocumab  (REPATHA  SURECLICK) 140 mg/mL Subcutaneous Pen Injector Inject 1 pen (140 mg) subcutaneously (under the skin) once every 14 days    ezetimibe  (ZETIA ) 10 mg Oral Tablet TAKE 1 TABLET BY MOUTH EVERY DAY IN THE EVENING    fluticasone  propionate (FLONASE  NASL) by Nasal route    Irbesartan-Hydrochlorothiazide  300-12.5 mg Oral Tablet Take 300 mg by mouth Once a day    Isosorbide  Mononitrate (IMDUR ) 120 mg Oral Tablet Sustained Release 24 hr Take 1 Tablet (120 mg total) by mouth Every morning    levothyroxine  (SYNTHROID ) 112 mcg Oral Tablet Take 1 Tablet (112 mcg total) by mouth Every morning    metoprolol  succinate (TOPROL -XL) 50 mg Oral Tablet Sustained Release 24 hr Take 1 Tablet (50 mg total) by mouth Once a day    nitroGLYCERIN  (NITROSTAT ) 0.4 mg Sublingual Tablet, Sublingual PLACE 1 TABLET UNDER TONGUE EVERY 5 MINS, UP TO 3 DOSES AS NEEDED FOR CHEST PAIN    pantoprazole  (PROTONIX ) 40 mg Oral Tablet, Delayed Release (E.C.) Take 1 Tablet (40 mg total) by mouth Daily    POTASSIUM-99 ORAL Take by mouth Once a day    ranolazine  (RANEXA ) 500 mg Oral Tablet Sustained  Release 12 hr Take 1 Tablet (500 mg total) by mouth Twice daily    valACYclovir (VALTREX) 500 mg Oral Tablet Take 1 Tablet (500 mg total) by mouth Twice daily    vitamin B complex (B COMPLEX-VITAMIN B12 ORAL) Take by mouth Once a day       Allergies:  Allergies   Allergen Reactions    Pneumococcal 23-Valent Polysaccharide Vaccine  Other Adverse Reaction (Add comment)     Severe local swelling    Tramadol Nausea/ Vomiting     "makes me sick with or without food"    Iohexol  Other Adverse Reaction (Add comment) and Hives/ Urticaria      Desc: HIVES SEVERAL HRS S/P IV CONTRAST.. OK LAST SCAN W/ BENADRYL  PREMED @ [facility] '01/AC.    Lisinopril  Other Adverse Reaction (Add comment)     Other reaction(s): Cough    Other Rash     Other reaction(s): Other (See Comments)  Nickle   When teeth were wired in she place, patient obtained an infection and wiring had to be removed,  Also allergic to silver    Crestor [Rosuvastatin]      Muscle aches    Lovastatin     Statin [Atorvastatin]      Muscle ache    Iv Contrast Hives/ Urticaria    Nickel Hives/ Urticaria       Social History:  Social History     Socioeconomic History    Marital status: Married   Tobacco Use    Smoking status: Former     Current packs/day: 0.50     Average packs/day: 0.5 packs/day for 10.0 years (5.0 ttl pk-yrs)     Types: Cigarettes    Smokeless tobacco: Never   Vaping Use    Vaping status: Never Used   Substance and Sexual Activity    Alcohol use: Not Currently     Alcohol/week: 1.0 standard drink of alcohol     Types: 1 Cans of beer per week     Comment: drank long time ago not current.    Drug use: Never    Sexual activity: Not Currently   Other Topics Concern    Ability to Walk 1 Flight of Steps without SOB/CP No  Ability To Do Own ADL's Yes        Occupation:   Social History     Occupational History    Not on file     Family History:  Family Medical History:       Problem Relation (Age of Onset)    Cardiomyopathy Father    Heart Attack Mother    Heart  Disease Sister    Uterine Cancer Mother, Niece             Review of Systems:  Nursing Notes:   Linda Repress, [place]  04/28/24 1123  Signed  Review of Systems :    Constitutional: no fevers, no chills, no weight loss  Eyes: no double vision, no blurred vision  Ears: no hearing loss  Respiratory: no cough, no shortness of breath, no home oxygen   Cardiovascular: no chest pain, no palpitation  Gastrointestinal: no diarrhea, no abdominal pain   Genitourinary: no urinary incontinence   Integumentary: no rashes, no itching  Neurological: no dizziness, no headache, no tremor   Musculoskeletal: no back pain, assistive device no, no muscle weakness, no muscle aches  Comorbid conditions:  no diabetes, no neuropathy, no venous insufficiency     Vital Signs:  Vitals:    04/28/24 1121   BP: 118/80   Temp: 36.6 C (97.8 F)   TempSrc: Temporal   Weight: 72.1 kg (159 lb)   Height: 1.575 m (5\' 2" )   BMI: 29.08          Examination:  GENERAL: well nourished, no acute distress   HEAD: normocephalic, atraumatic  EYES: pupils equal, round   EARS: normal   SKIN: warm and dry, no suspicious lesions   HEART: regular rate and rhythm by pulse examination  LUNGS: breathing normal, symmetric chest rise, no distress.   ABDOMEN: soft, no tenderness, non-distended   EXTREMITIES: no clubbing, cyanosis, or edema  Cervical spine:  Stiffness with motion, no significant tenderness, negative Spurling's.  Right shoulder:  Mild tenderness at the acromioclavicular joint, mildly positive cross-arm, shoulder range of motion abduction actively 80, passively 100, forward flexion actively 80, passively 100, external rotation at the side 45, internal rotation to L1, equivocal lift-off, equivocal empty can, negative drop arm, negative Yergason's, equivocal Speed's, stable to anterior and posterior load shift, negative drawer, positive Hawkins, positive Neer's, light touch sensation intact distally, brisk capillary refill, palpable radial pulse, able to flex  and extend the fingers and the thumb and abduct the fingers    Assessment/Plan:   (S46.011A) Traumatic complete tear of right rotator cuff, initial encounter  (primary encounter diagnosis)  Plan:  Almost 2 months out from fall and some right shoulder pain and decreased function initially but it seems to be improving with time.  Radiographs of the right shoulder were not able to be viewed and we could get radiographs of the shoulder.  MRI of the right shoulder from [facility] in the system show subcoracoid space 5 mm, full-thickness tear of the anterior supraspinatus with surrounding tendinosis, subacromial and subdeltoid bursitis, mild acromioclavicular joint arthrosis, possibly some tearing of the subscapularis with some mild atrophy of the subscapularis and some fluid in the anterior shoulder.  We discussed that we could consider doing a right shoulder arthroscopic rotator cuff repair, subacromial decompression with acromioplasty, distal clavicle excision, possible open biceps tenodesis.  Given her age and the fact she is improving with time and her comorbidities I think it may be more reasonable to try nonoperative treatment initially.  She can do physical therapy working on range of motion, stretching, strengthening, all modalities necessary to treat.  She could do a steroid injection in her shoulder but she deferred this.  She can follow up with me in 6-8 weeks as needed.  The diagnosis, treatment options, and prognosis were discussed with the patient.  All questions were answered.  Written instructions were given; patient indicated understanding.    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 The patient was educated and given the opportunity to ask questions, and all questions were answered to the patient's satisfaction. Written instructions were given; the patient indicated understanding.   2.   Advised Patient to call the office with any questions or concerns.    Medication Orders   No Medications ordered       Orders Placed This Encounter   No orders of the following type(s) were placed in this encounter: Procedures, Microbiology, Radiation Oncology, Imaging, Immunization/Injection, Lab, Outpatient Referral, Code Status, Consult, OT, PT, SLP, Card Rehab, REHAB, [employer], Cardiac Services, ENT, Point of Care Testing, Electrophysiology, [facility], ECHO, Blood Bank, Health Maintenance, Neurology, Wound Ostomy, PFT, OB, Ophthalmology, Sleep Center, Audiology, Therapeutic Recreation Orderables, IV, Cardiac Cath, GU Procedure, Dialysis, Substance Abuse, GI, Pharmacy Supplies, Schedule Follow Up Orders, RIS Invasive.       Jenifer Miu, MD, 04/28/2024, 12:07    This note was partially generated using MModal Fluency Direct system, and there may be some incorrect words, spellings, and punctuation that were not noted in checking the note before saving.

## 2024-04-30 ENCOUNTER — Encounter (INDEPENDENT_AMBULATORY_CARE_PROVIDER_SITE_OTHER): Admitting: Internal Medicine

## 2024-04-30 ENCOUNTER — Other Ambulatory Visit: Payer: Self-pay

## 2024-04-30 DIAGNOSIS — R55 Syncope and collapse: Secondary | ICD-10-CM

## 2024-04-30 DIAGNOSIS — Z95818 Presence of other cardiac implants and grafts: Secondary | ICD-10-CM

## 2024-05-01 ENCOUNTER — Telehealth (HOSPITAL_BASED_OUTPATIENT_CLINIC_OR_DEPARTMENT_OTHER): Payer: Self-pay | Admitting: Medical

## 2024-05-01 NOTE — Telephone Encounter (Signed)
 05/01/24 @ 1530 RTC to patient that left a voicemail. Pt was just wondering if she can have an injection in the shoulder and the following week, have one in her knee. I told her that it would be a problem. Pt voiced her understanding.    Kimberly D Perrine

## 2024-05-07 ENCOUNTER — Other Ambulatory Visit (HOSPITAL_COMMUNITY): Payer: Self-pay

## 2024-05-07 DIAGNOSIS — Z1231 Encounter for screening mammogram for malignant neoplasm of breast: Secondary | ICD-10-CM

## 2024-05-12 ENCOUNTER — Encounter (INDEPENDENT_AMBULATORY_CARE_PROVIDER_SITE_OTHER): Payer: Self-pay

## 2024-05-12 ENCOUNTER — Other Ambulatory Visit: Payer: Self-pay

## 2024-05-12 ENCOUNTER — Ambulatory Visit (INDEPENDENT_AMBULATORY_CARE_PROVIDER_SITE_OTHER): Payer: Self-pay

## 2024-05-12 VITALS — BP 110/70 | Temp 97.6°F | Ht 62.0 in | Wt 159.0 lb

## 2024-05-12 DIAGNOSIS — M7541 Impingement syndrome of right shoulder: Secondary | ICD-10-CM

## 2024-05-12 DIAGNOSIS — I2510 Atherosclerotic heart disease of native coronary artery without angina pectoris: Secondary | ICD-10-CM

## 2024-05-12 DIAGNOSIS — M19011 Primary osteoarthritis, right shoulder: Secondary | ICD-10-CM

## 2024-05-12 DIAGNOSIS — I10 Essential (primary) hypertension: Secondary | ICD-10-CM

## 2024-05-12 DIAGNOSIS — I7121 Aneurysm of the ascending aorta, without rupture: Secondary | ICD-10-CM

## 2024-05-12 DIAGNOSIS — I272 Pulmonary hypertension, unspecified: Secondary | ICD-10-CM

## 2024-05-12 DIAGNOSIS — S46011D Strain of muscle(s) and tendon(s) of the rotator cuff of right shoulder, subsequent encounter: Secondary | ICD-10-CM

## 2024-05-12 DIAGNOSIS — E039 Hypothyroidism, unspecified: Secondary | ICD-10-CM

## 2024-05-12 MED ORDER — LIDOCAINE HCL 10 MG/ML (1 %) INJECTION SOLUTION
6.0000 mL | INTRAMUSCULAR | Status: AC
Start: 2024-05-12 — End: 2024-05-12
  Administered 2024-05-12: 60 mg via INTRAMUSCULAR

## 2024-05-12 MED ORDER — METHYLPREDNISOLONE ACETATE 40 MG/ML SUSPENSION FOR INJECTION
60.0000 mg | INTRAMUSCULAR | Status: AC
Start: 2024-05-12 — End: 2024-05-12
  Administered 2024-05-12: 60 mg via INTRAMUSCULAR

## 2024-05-12 NOTE — Procedures (Signed)
 [facility]  [address] [place] [address]    Procedure Note    Name: [name] [name] MRN:  [id number]   Date: 05/12/2024 DOB:  May 09, 1941 (82 y.o.)               20610 - ARTHROCENTESIS ASPIRATION/INJ, MAJOR JOINT/BURSA (SHOULDER/HIP/KNEE/SUBACROMIAL BURSA); WO US  (AMB ONLY-PD)    Performed by: Jenifer Miu, MD  Authorized by: Jenifer Miu, MD    Time Out:     Immediately before the procedure, a time out was called:  Yes    Patient verified:  Yes    Procedure Verified:  Yes    Site Verified:  Yes        The risks and benefits of the procedure were discussed with the patient, and the patient expressed understanding.  Informed consent was obtained and documented in the chart.  Final Time Out was performed verifying correct patient, side, site, and procedure.  The right shoulder was sterilely prepped with Povidone-Iodine , and Ethyl Chloride cold spray was used to numb the skin. A 21-gauge needle was inserted into the subacromial space through the posterolateral site and a mixture of 1.5 mL of 40 mg/mL Depo-Medrol  mixed with 6 mL of 1% lidocaine  plain was injected intra-articularly. The patient tolerated the procedure well.    Complications: None.        Jenifer Miu, MD  05/12/2024 10:40       This note was partially generated using MModal Fluency Direct system, and there may be some incorrect words, spellings, and punctuation that were not noted in checking the note before saving.

## 2024-05-12 NOTE — Nursing Note (Signed)
 Review of Systems :    Constitutional: no fevers, no chills, no weight loss  Eyes: no double vision, no blurred vision  Ears: no hearing loss  Respiratory: no cough, no shortness of breath, no home oxygen   Cardiovascular: no chest pain, no palpitation  Gastrointestinal: no diarrhea, no abdominal pain   Genitourinary: no urinary incontinence   Integumentary: no rashes, no itching  Neurological: no dizziness, no headache, no tremor   Musculoskeletal: no back pain, assistive device no, no muscle weakness, no muscle aches  Comorbid conditions:  no diabetes, no neuropathy, no venous insufficiency

## 2024-05-12 NOTE — Progress Notes (Signed)
 [facility]  [address] [place] [address]      Name: [name] [name] MRN:  [id number]   Date: 05/12/2024 DOB:  08-Dec-1940 (83 y.o.)        Chief Complaint:  Shoulder Pain (Right shoulder)      History of Present Illness:  Right shoulder:  83 year old right-hand dominant female who previously presented complaining of injury to her right shoulder.  She reports on 03 March 2024 falling off of a couch and landing on the floor impacting her right shoulder.  After the fall she had increased pain about the superior and lateral shoulder.  The pain could radiate down to her hand but this has resolved.  Overall the pain has significantly improved with time.  She denies any numbness or tingling distally.  She reports some chronic neck pain but this has not changed in his relatively mild currently.  The pain is worse with raising her arm and trying to do overhead activities and using her arm and at night.  Heat seems to help some.  Tylenol  seems to help some.  She has not done physical therapy on the shoulder.  She has not had previous injections in the shoulder.  She denies fevers, chills, shortness of breath, chest pain.    Past Medical History:  Past Medical History:   Diagnosis Date    Cancer (CMS [facility])     skin cancer -- forehead  2024    Coronary artery disease     CVA (cerebral vascular accident) 2009    Dyspnea on exertion     Essential hypertension     Hyperlipidemia     PONV (postoperative nausea and vomiting)     Skin cancer     Stented coronary artery     Type 2 diabetes mellitus           Past Surgical History:  Past Surgical History:   Procedure Laterality Date    CARDIAC CATHETERIZATION      CATARACT EXTRACTION, BILATERAL Bilateral     HX COLONOSCOPY      HX HEART CATHETERIZATION  2017 and 2018    Stented twice  prox circ  and circ    HX PARTIAL HYSTERECTOMY      tubes and ovaries left intact    HX THYROIDECTOMY  1978    HX WISDOM TEETH EXTRACTION      KNEE SURGERY  2018    muscle removed     LAPAROSCOPIC CHOLECYSTECTOMY      RECONSTRUCTION OF NOSE  1980    SKIN CANCER EXCISION      left side forehead         Medications:  Current Outpatient Medications   Medication Sig    ALPRAZolam  (XANAX ) 0.5 mg Oral Tablet Take 1 Tablet (0.5 mg total) by mouth Every evening    amLODIPine  (NORVASC ) 5 mg Oral Tablet Take 1 Tablet (5 mg total) by mouth Once a day    bempedoic acid  180 mg Oral Tablet Take 1 Tablet (180 mg total) by mouth Once a day    Cholecalciferol, Vitamin D3, 10 mcg (400 unit) Oral Tablet, Chewable Chew Once a day    clopidogreL  (PLAVIX ) 75 mg Oral Tablet Take 1 Tablet (75 mg total) by mouth Daily    empagliflozin  (JARDIANCE ) 10 mg Oral Tablet Take 1 Tablet (10 mg total) by mouth Once a day    estradioL  (ESTRACE ) 0.01 % (0.1 mg/gram) Vaginal Cream Insert 2 grams every other night x  2 weeks then insert 2 grams twice weekly for maintenance    evolocumab  (REPATHA  SURECLICK) 140 mg/mL Subcutaneous Pen Injector Inject 1 pen (140 mg) subcutaneously (under the skin) once every 14 days    ezetimibe  (ZETIA ) 10 mg Oral Tablet TAKE 1 TABLET BY MOUTH EVERY DAY IN THE EVENING    fluticasone  propionate (FLONASE  NASL) by Nasal route    Irbesartan-Hydrochlorothiazide  300-12.5 mg Oral Tablet Take 300 mg by mouth Once a day    Isosorbide  Mononitrate (IMDUR ) 120 mg Oral Tablet Sustained Release 24 hr Take 1 Tablet (120 mg total) by mouth Every morning    levothyroxine  (SYNTHROID ) 112 mcg Oral Tablet Take 1 Tablet (112 mcg total) by mouth Every morning    metoprolol  succinate (TOPROL -XL) 50 mg Oral Tablet Sustained Release 24 hr Take 1 Tablet (50 mg total) by mouth Once a day    nitroGLYCERIN  (NITROSTAT ) 0.4 mg Sublingual Tablet, Sublingual PLACE 1 TABLET UNDER TONGUE EVERY 5 MINS, UP TO 3 DOSES AS NEEDED FOR CHEST PAIN    pantoprazole  (PROTONIX ) 40 mg Oral Tablet, Delayed Release (E.C.) Take 1 Tablet (40 mg total) by mouth Daily    POTASSIUM-99 ORAL Take by mouth Once a day    ranolazine  (RANEXA ) 500 mg Oral Tablet  Sustained Release 12 hr Take 1 Tablet (500 mg total) by mouth Twice daily    valACYclovir (VALTREX) 500 mg Oral Tablet Take 1 Tablet (500 mg total) by mouth Twice daily    vitamin B complex (B COMPLEX-VITAMIN B12 ORAL) Take by mouth Once a day       Allergies:  Allergies   Allergen Reactions    Pneumococcal 23-Valent Polysaccharide Vaccine  Other Adverse Reaction (Add comment)     Severe local swelling    Tramadol Nausea/ Vomiting     makes me sick with or without food    Iohexol  Other Adverse Reaction (Add comment) and Hives/ Urticaria      Desc: HIVES SEVERAL HRS S/P IV CONTRAST.. OK LAST SCAN W/ BENADRYL  PREMED @ [facility] '01/AC.    Lisinopril  Other Adverse Reaction (Add comment)     Other reaction(s): Cough    Other Rash     Other reaction(s): Other (See Comments)  Nickle   When teeth were wired in she place, patient obtained an infection and wiring had to be removed,  Also allergic to silver    Crestor [Rosuvastatin]      Muscle aches    Lovastatin     Statin [Atorvastatin]      Muscle ache    Iv Contrast Hives/ Urticaria    Nickel Hives/ Urticaria       Social History:  Social History     Socioeconomic History    Marital status: Married   Tobacco Use    Smoking status: Former     Current packs/day: 0.50     Average packs/day: 0.5 packs/day for 10.0 years (5.0 ttl pk-yrs)     Types: Cigarettes    Smokeless tobacco: Never   Vaping Use    Vaping status: Never Used   Substance and Sexual Activity    Alcohol use: Not Currently     Alcohol/week: 1.0 standard drink of alcohol     Types: 1 Cans of beer per week     Comment: drank long time ago not current.    Drug use: Never    Sexual activity: Not Currently   Other Topics Concern    Ability to Walk 1 Flight of Steps without SOB/CP  No    Ability To Do Own ADL's Yes        Occupation:   Social History     Occupational History    Not on file     Family History:  Family Medical History:       Problem Relation (Age of Onset)    Cardiomyopathy Father    Heart Attack Mother     Heart Disease Sister    Uterine Cancer Mother, Niece             Review of Systems:  Nursing Notes:   Linda Repress, [place]  05/12/24 1022  Signed  Review of Systems :    Constitutional: no fevers, no chills, no weight loss  Eyes: no double vision, no blurred vision  Ears: no hearing loss  Respiratory: no cough, no shortness of breath, no home oxygen   Cardiovascular: no chest pain, no palpitation  Gastrointestinal: no diarrhea, no abdominal pain   Genitourinary: no urinary incontinence   Integumentary: no rashes, no itching  Neurological: no dizziness, no headache, no tremor   Musculoskeletal: no back pain, assistive device no, no muscle weakness, no muscle aches  Comorbid conditions:  no diabetes, no neuropathy, no venous insufficiency     Vital Signs:  Vitals:    05/12/24 1021   BP: 110/70   Temp: 36.4 C (97.6 F)   TempSrc: Temporal   Weight: 72.1 kg (159 lb)   Height: 1.575 m (5' 2)   BMI: 29.08          Examination:  GENERAL: well nourished, no acute distress   HEAD: normocephalic, atraumatic  EYES: pupils equal, round   EARS: normal   SKIN: warm and dry, no suspicious lesions   HEART: regular rate and rhythm by pulse examination  LUNGS: breathing normal, symmetric chest rise, no distress.   ABDOMEN: soft, no tenderness, non-distended   EXTREMITIES: no clubbing, cyanosis, or edema  Cervical spine:  Unchanged from previous exam, stiffness with motion, no significant tenderness, negative Spurling's.  Right shoulder:  Unchanged from previous exam, mild tenderness at the acromioclavicular joint, mildly positive cross-arm, shoulder range of motion abduction actively 80, passively 100, forward flexion actively 80, passively 100, external rotation at the side 45, internal rotation to L1, equivocal lift-off, equivocal empty can, negative drop arm, negative Yergason's, equivocal Speed's, stable to anterior and posterior load shift, negative drawer, positive Hawkins, positive Neer's, light touch sensation intact  distally, brisk capillary refill, palpable radial pulse, able to flex and extend the fingers and the thumb and abduct the fingers    Assessment/Plan:   (S46.011D) Traumatic complete tear of right rotator cuff, subsequent encounter  (primary encounter diagnosis)  Plan:  Over 2 months out from fall and some right shoulder pain and decreased function initially but it seems to be improving with time.  Radiographs of the right shoulder were not able to be viewed and we could get radiographs of the shoulder.  MRI of the right shoulder from [facility] in the system show subcoracoid space 5 mm, full-thickness tear of the anterior supraspinatus with surrounding tendinosis, subacromial and subdeltoid bursitis, mild acromioclavicular joint arthrosis, possibly some tearing of the subscapularis with some mild atrophy of the subscapularis and some fluid in the anterior shoulder.  We discussed that we could consider doing a right shoulder arthroscopic rotator cuff repair, subacromial decompression with acromioplasty, distal clavicle excision, possible open biceps tenodesis.  Given her age and the fact she is improving with time and her comorbidities I  think it may be more reasonable to try nonoperative treatment initially.  She can do physical therapy working on range of motion, stretching, strengthening, all modalities necessary to treat.  She underwent a right subacromial steroid injection today and we can see how she does with this.  She can follow up with me in 6-8 weeks as needed.  The diagnosis, treatment options, and prognosis were discussed with the patient.  All questions were answered.  Written instructions were given; patient indicated understanding.    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 The patient was educated and given the opportunity to ask questions, and all questions were answered to the patient's satisfaction. Written instructions were given; the patient indicated understanding.   2.   Advised Patient to call the office with any questions or concerns.    Medication Orders   No Medications ordered       Orders Placed This Encounter   No orders of the following type(s) were placed in this encounter: Procedures, Microbiology, Radiation Oncology, Imaging, Immunization/Injection, Lab, Outpatient Referral, Code Status, Consult, OT, PT, SLP, Card Rehab, REHAB, [employer], Cardiac Services, ENT, Point of Care Testing, Electrophysiology, [facility], ECHO, Blood Bank, Health Maintenance, Neurology, Wound Ostomy, PFT, OB, Ophthalmology, Sleep Center, Audiology, Therapeutic Recreation Orderables, IV, Cardiac Cath, GU Procedure, Dialysis, Substance Abuse, GI, Pharmacy Supplies, Schedule Follow Up Orders, RIS Invasive.       Jenifer Miu, MD, 05/12/2024, 10:37    This note was partially generated using MModal Fluency Direct system, and there may be some incorrect words, spellings, and punctuation that were not noted in checking the note before saving.

## 2024-05-13 ENCOUNTER — Encounter (INDEPENDENT_AMBULATORY_CARE_PROVIDER_SITE_OTHER): Payer: Self-pay

## 2024-05-13 DIAGNOSIS — R0600 Dyspnea, unspecified: Secondary | ICD-10-CM

## 2024-05-13 DIAGNOSIS — I5030 Unspecified diastolic (congestive) heart failure: Secondary | ICD-10-CM

## 2024-05-13 DIAGNOSIS — R931 Abnormal findings on diagnostic imaging of heart and coronary circulation: Secondary | ICD-10-CM

## 2024-05-14 LAB — TRANSTHORACIC ECHOCARDIOGRAM - ADULT
EF VISUAL ESTIMATE: 60
EF: 65

## 2024-05-15 ENCOUNTER — Ambulatory Visit (INDEPENDENT_AMBULATORY_CARE_PROVIDER_SITE_OTHER): Payer: Self-pay | Admitting: NURSE PRACTITIONER, FAMILY

## 2024-05-19 ENCOUNTER — Other Ambulatory Visit: Payer: Self-pay

## 2024-05-19 ENCOUNTER — Encounter (HOSPITAL_BASED_OUTPATIENT_CLINIC_OR_DEPARTMENT_OTHER): Payer: Self-pay | Admitting: Medical

## 2024-05-19 ENCOUNTER — Ambulatory Visit: Payer: Self-pay | Attending: Medical | Admitting: Medical

## 2024-05-19 VITALS — Ht 62.0 in | Wt 157.6 lb

## 2024-05-19 DIAGNOSIS — M1711 Unilateral primary osteoarthritis, right knee: Secondary | ICD-10-CM | POA: Insufficient documentation

## 2024-05-19 MED ORDER — TRIAMCINOLONE ACETONIDE ER 32 MG INTRA-ARTICULAR SUSPENSION EXT.RELEAS
32.0000 mg | Freq: Once | INTRA_ARTICULAR | Status: AC
Start: 2024-05-19 — End: 2024-05-19
  Administered 2024-05-19: 32 mg via INTRA_ARTICULAR

## 2024-05-19 NOTE — Progress Notes (Signed)
 Orthopaedics [facility]  [address]  [place], [place] [address]  [phone]       OFFICE VISIT    PATIENT NAME:  [name] [name]  MEDICAL RECORD NUMBER: [id number]    DICTATING PHYSICIAN:  Miachel Adams, PA-C  REFERRING PHYSICIAN:  Miachel Adams, PA-C    DOB:  Jul 01, 1941  DOS:  05/19/2024    CHIEF COMPLAINT:  right Knee Pain      HISTORY OF PRESENT ILLNESS:  [name] [name] is a 83 y.o. female.  Comes to the office today for follow up consultation in regards to ongoing right knee pain.  Patient states that this has been ongoing for many years.  she cannot recall one specific injury that caused the pain.  Patient tells me that their symptoms are worse with weight bearing activities and ambulating on uneven ground and downward grades.  she has pain that awakens them from sleeping. In addition to modifying the activities that create pain, she has attempted NSAIDs, tylenol , steroid injections, Visco injections, PRP injections, Zilretta  in the right knee.  She has multiple health issues including cardiac problems, h/o stroke.  At this point the patient is here today to discuss further evaluation of their knee pain.   She is currently scheduled to undergo a right TKA on 03/02/25.    PAST MEDICAL HISTORY:  Past Medical History:   Diagnosis Date    Cancer (CMS [facility])     skin cancer -- forehead  2024    Coronary artery disease     CVA (cerebral vascular accident) 2009    Dyspnea on exertion     Essential hypertension     Hyperlipidemia     PONV (postoperative nausea and vomiting)     Skin cancer     Stented coronary artery     Type 2 diabetes mellitus            PAST SURGICAL HISTORY:  Past Surgical History:   Procedure Laterality Date    CARDIAC CATHETERIZATION      CATARACT EXTRACTION, BILATERAL Bilateral     HX COLONOSCOPY      HX HEART CATHETERIZATION  2017 and 2018    Stented twice  prox circ  and circ    HX PARTIAL HYSTERECTOMY      tubes and ovaries left intact    HX THYROIDECTOMY  1978    HX WISDOM TEETH  EXTRACTION      KNEE SURGERY  2018    muscle removed    LAPAROSCOPIC CHOLECYSTECTOMY      RECONSTRUCTION OF NOSE  1980    SKIN CANCER EXCISION      left side forehead           MEDICATIONS:  Current Outpatient Medications   Medication Sig    ALPRAZolam  (XANAX ) 0.5 mg Oral Tablet Take 1 Tablet (0.5 mg total) by mouth Every evening    amLODIPine  (NORVASC ) 5 mg Oral Tablet Take 1 Tablet (5 mg total) by mouth Once a day    bempedoic acid  180 mg Oral Tablet Take 1 Tablet (180 mg total) by mouth Once a day    Cholecalciferol, Vitamin D3, 10 mcg (400 unit) Oral Tablet, Chewable Chew Once a day    clopidogreL  (PLAVIX ) 75 mg Oral Tablet Take 1 Tablet (75 mg total) by mouth Daily    empagliflozin  (JARDIANCE ) 10 mg Oral Tablet Take 1 Tablet (10 mg total) by mouth Once a day    estradioL  (ESTRACE ) 0.01 % (0.1 mg/gram) Vaginal Cream Insert  2 grams every other night x 2 weeks then insert 2 grams twice weekly for maintenance    evolocumab  (REPATHA  SURECLICK) 140 mg/mL Subcutaneous Pen Injector Inject 1 pen (140 mg) subcutaneously (under the skin) once every 14 days    ezetimibe  (ZETIA ) 10 mg Oral Tablet TAKE 1 TABLET BY MOUTH EVERY DAY IN THE EVENING    fluticasone  propionate (FLONASE  NASL) by Nasal route    Irbesartan-Hydrochlorothiazide  300-12.5 mg Oral Tablet Take 300 mg by mouth Once a day    Isosorbide  Mononitrate (IMDUR ) 120 mg Oral Tablet Sustained Release 24 hr Take 1 Tablet (120 mg total) by mouth Every morning    levothyroxine  (SYNTHROID ) 112 mcg Oral Tablet Take 1 Tablet (112 mcg total) by mouth Every morning    metoprolol  succinate (TOPROL -XL) 50 mg Oral Tablet Sustained Release 24 hr Take 1 Tablet (50 mg total) by mouth Once a day    nitroGLYCERIN  (NITROSTAT ) 0.4 mg Sublingual Tablet, Sublingual PLACE 1 TABLET UNDER TONGUE EVERY 5 MINS, UP TO 3 DOSES AS NEEDED FOR CHEST PAIN    pantoprazole  (PROTONIX ) 40 mg Oral Tablet, Delayed Release (E.C.) Take 1 Tablet (40 mg total) by mouth Daily    POTASSIUM-99 ORAL Take by  mouth Once a day    ranolazine  (RANEXA ) 500 mg Oral Tablet Sustained Release 12 hr Take 1 Tablet (500 mg total) by mouth Twice daily    valACYclovir (VALTREX) 500 mg Oral Tablet Take 1 Tablet (500 mg total) by mouth Twice daily    vitamin B complex (B COMPLEX-VITAMIN B12 ORAL) Take by mouth Once a day       ALLERGIES:  Allergy History as of 05/19/24       IV CONTRAST         Noted Status Severity Type Reaction    08/28/19 1339 Osmond, Burdett, LPN 09/81/19 Active Low  Hives/ Urticaria              NICKEL         Noted Status Severity Type Reaction    08/28/19 1339 [place], [place], LPN 14/78/29 Active Low  Hives/ Urticaria              ATORVASTATIN         Noted Status Severity Type Reaction    05/24/21 1302 Calain, Amy, RN 05/24/21 Active       Comments: Muscle ache               ROSUVASTATIN         Noted Status Severity Type Reaction    05/24/21 1303 Calain, Amy, RN 05/24/21 Active       Comments: Muscle aches               LOVASTATIN         Noted Status Severity Type Reaction    05/24/21 1303 Calain, Amy, RN 05/24/21 Active                 IOHEXOL         Noted Status Severity Type Reaction    01/18/22 0401 Loma Rising, RN 08/15/05 Active Medium Side Effect  Other Adverse Reaction (Add comment), Hives/ Urticaria    Comments:  Desc: HIVES SEVERAL HRS S/P IV CONTRAST.. OK LAST SCAN W/ BENADRYL  PREMED @ [facility] '01/AC.     07/26/21 1424 Flesher, Ellison Ha, LPN 56/21/30 Active Medium  Hives/ Urticaria,  Other Adverse Reaction (Add comment)    Comments:  Desc: HIVES SEVERAL HRS S/P IV CONTRAST.. [place] LAST SCAN  W/ BENADRYL  PREMED @ [facility] '01/AC.               LISINOPRIL         Noted Status Severity Type Reaction    01/18/22 0402 Loma Rising, RN 05/25/14 Active Medium Side Effect  Other Adverse Reaction (Add comment)    Comments: Other reaction(s): Cough     07/26/21 1424 Oscar Blazing, LPN 98/11/91 Active Medium      Comments: Other reaction(s): Cough               PNEUMOCOCCAL 23-VALENT POLYSACCHARIDE VACCINE          Noted Status Severity Type Reaction    01/18/22 0401 Loma Rising, RN 05/25/14 Active High Systemic  Other Adverse Reaction (Add comment)    Comments: Severe local swelling     07/26/21 1424 Oscar Blazing, LPN 47/82/95 Active High   Other Adverse Reaction (Add comment)    Comments: Severe local swelling               TRAMADOL         Noted Status Severity Type Reaction    01/18/22 0401 Loma Rising, RN 10/21/18 Active High Side Effect Nausea/ Vomiting    Comments: makes me sick with or without food     07/26/21 1424 Oscar Blazing, LPN 62/13/08 Active High  Nausea/ Vomiting    Comments: makes me sick with or without food               OTHER         Noted Status Severity Type Reaction    01/18/22 0401 Loma Rising, RN 01/12/16 Active Medium Side Effect Rash    Comments: Other reaction(s): Other (See Comments)  Nickle   When teeth were wired in she place, patient obtained an infection and wiring had to be removed,  Also allergic to silver     07/26/21 1425 Oscar Blazing, LPN 65/78/46 Active Medium  Rash    Comments: Other reaction(s): Other (See Comments)  Nickle   When teeth were wired in she place, patient obtained an infection and wiring had to be removed,  Also allergic to silver               SILVER         Noted Status Severity Type Reaction    05/19/24 1108 Retia Castellani [place], [place] 05/19/24 Active Medium  Rash                    FAMILY HISTORY:  Family Medical History:       Problem Relation (Age of Onset)    Cardiomyopathy Father    Heart Attack Mother    Heart Disease Sister    Uterine Cancer Mother, Niece                SOCIAL HISTORY:  Social History     Socioeconomic History    Marital status: Married     Spouse name: Not on file    Number of children: Not on file    Years of education: Not on file    Highest education level: Not on file   Occupational History    Not on file   Tobacco Use    Smoking status: Former     Current packs/day: 0.50     Average packs/day: 0.5 packs/day for 10.0 years  (5.0 ttl pk-yrs)     Types: Cigarettes    Smokeless tobacco: Never   Vaping  Use    Vaping status: Never Used   Substance and Sexual Activity    Alcohol use: Not Currently     Alcohol/week: 1.0 standard drink of alcohol     Types: 1 Cans of beer per week     Comment: drank long time ago not current.    Drug use: Never    Sexual activity: Not Currently   Other Topics Concern    Ability to Walk 1 Flight of Steps without SOB/CP No    Routine Exercise Not Asked    Ability to Walk 2 Flight of Steps without SOB/CP Not Asked    Unable to Ambulate Not Asked    Total Care Not Asked    Ability To Do Own ADL's Yes    Uses Walker Not Asked    Other Activity Level Not Asked    Uses Cane Not Asked   Social History Narrative    Not on file     Social Determinants of Health     Financial Resource Strain: Not on file   Transportation Needs: Not on file   Social Connections: Not on file   Intimate Partner Violence: Not on file   Housing Stability: Not on file       REVIEW OF SYSTEMS  Constitutional: negative  Musculoskeletal:positive for Right knee pain  All other ROS Negative    PHYSICAL EXAMINATION:    Ht 1.575 m (5' 2)   Wt 71.5 kg (157 lb 10.1 oz)   BMI 28.83 kg/m         GENERAL: she is alert, cooperative, no distress, appears stated age.      ORTHOPEDIC EVALUATION:    Patient displays an abnormal gait.  Patient favors the rightlower extremity.  Gross inspection reveals normal mechanical and anatomic axis of the lower extremities with no appreciable leg length discrepancy.  No tenderness found with AP or lateral compression testing of the pelvis.    Right hip shows full PROM (Forward flexion 0-115 degrees, internal rotation 0-35 degrees, external rotation 0-45 degrees). McCarthy's Maneuvers are negative for impingement. FABRE testing negative. No tenderness to palpation over the greater trochanter.  Straight leg testing is negative for radiculopathy.  Compartments are supple.  Skin is clear.  +5/5 hip flexors/extenders, as  well as, knee flexors and extenders.  Intact sensory exam along all dermatomes.    Left hip shows full PROM (Forward flexion 0-115 degrees, internal rotation 0-35 degrees, external rotation 0-45 degrees). McCarthy's Maneuvers are negative for impingement. FABRE testing negative. No tenderness to palpation over the greater trochanter.  Straight leg testing is negative for radiculopathy.  Compartments are supple.  Skin is clear.  +5/5 hip flexors/extenders, as well as, knee flexors and extenders.  Intact sensory exam along all dermatomes.    Right knee shows full PROM (0-120 degrees).  Patella tracks midline without instability.  Varus deformity.  Moderate tenderness to palpation of the distal femur and proximal tibia. Clark's patella-femoral test is positivefor crepitus and pain.  No effusion is present.  Lachman's test is negative.  Posterior Drawer test is negative.  Collateral ligamental testing is stable at 30 degrees of extension, mid-flexion, and terminal flexion.  McMurrary testing is negative.  No Baker's cyst is present.  Skin is clear.  Normoreflexic.   Left knee shows full PROM (0-120 degrees).  Patella tracks midline without instability.  Varus deformity.  Mild tenderness to palpation of the distal femur and proximal tibia. Clark's patella-femoral test is positivefor crepitus and pain.  No  effusion is present.  Lachman's test is negative.  Posterior Drawer test is negative.  Collateral ligamental testing is stable at 30 degrees of extension, mid-flexion, and terminal flexion.  McMurrary testing is negative.  No Baker's cyst is present.  Skin is clear.  Normoreflexic.    Right Ankle shows full PROM in plantar and dorsiflexion.  Stable to ligamental testing.  Skin is clear. +5/5 motor strength to plantar/dorsiflexion, inversion/eversion, and EHL.  Intact sensory to light-touch.  +2/4 dorsalis pedis and posterior tibial pulse.    Left Ankle shows full PROM in plantar and dorsiflexion.  Stable to ligamental  testing.  Skin is clear. +5/5 motor strength to plantar/dorsiflexion, inversion/eversion, and EHL.  Intact sensory to light-touch.  +2/4 dorsalis pedis and posterior tibial pulse.          IMAGING:    No new imaging.          IMPRESSION:      ICD-10-CM    1. Primary osteoarthritis of right knee  M17.11 Zilretta  Joint Injection Schedule/Auth          PLAN:          At this point I have discussed with the patient today my evaluation and recommended treatment alternatives.  We have discussed the pros and cons of both conservative, as well as, surgical intervention.After discussion we are going to continue with conservative management. I have reviewed alternative to exercise including aquatic therapy.  We discussed pharmacologic treatment with current medications.  In addition, injection management will be initiated with Zilretta  today.  All questions were answered to their completeness.  We will continue to monitor their symptoms as an outpatient.     We will continue with the right TKA as scheduled.         PROCEDURE:   I reviewed with the patient the risks, benefits, expected outcomes and potential complications of undergoing injection management today.  Patient has given consent and a signed copy has been placed on the chart.    Procedure:  The right knee was injected with 1 until vial of Zilretta .  All sterile techniques were utilized today. she will follow up with us  on an as needed basis.        TIME OUT: A time out was performed at 11:42  to confirm the correct patient, procedure, and site.  Melvin Staff, MA     This note was partially generated using MModal Fluency Direct system, and there may be some incorrect words, spellings, and punctuation that were not noted in checking the note before saving.    I am scribing for, and in the presence of, Miachel Adams, MMS, PA-C for services provided on 05/19/2024.  Melvin Staff, MA, SCRIBE      I personally performed the services described in this documentation, as scribed  in my presence, and it is both accurate and complete.  I have reviewed the note and made changes where needed.  I agree with the plan as above.    Miachel Adams, PA-C 05/19/2024          [facility] - Orthopaedics & Sports Medicine  [facility]

## 2024-05-20 ENCOUNTER — Encounter (HOSPITAL_BASED_OUTPATIENT_CLINIC_OR_DEPARTMENT_OTHER): Payer: Self-pay

## 2024-05-20 ENCOUNTER — Ambulatory Visit
Admission: RE | Admit: 2024-05-20 | Discharge: 2024-05-20 | Disposition: A | Discharge status: Home / Self Care | Source: Ambulatory Visit

## 2024-05-20 DIAGNOSIS — Z1231 Encounter for screening mammogram for malignant neoplasm of breast: Secondary | ICD-10-CM | POA: Insufficient documentation

## 2024-05-27 ENCOUNTER — Ambulatory Visit (INDEPENDENT_AMBULATORY_CARE_PROVIDER_SITE_OTHER): Payer: Self-pay | Admitting: OBSTETRICS/GYNECOLOGY

## 2024-05-31 ENCOUNTER — Encounter (INDEPENDENT_AMBULATORY_CARE_PROVIDER_SITE_OTHER): Admitting: Internal Medicine

## 2024-05-31 DIAGNOSIS — R55 Syncope and collapse: Secondary | ICD-10-CM

## 2024-05-31 DIAGNOSIS — Z1231 Encounter for screening mammogram for malignant neoplasm of breast: Secondary | ICD-10-CM

## 2024-05-31 DIAGNOSIS — Z95818 Presence of other cardiac implants and grafts: Secondary | ICD-10-CM

## 2024-06-01 ENCOUNTER — Other Ambulatory Visit (INDEPENDENT_AMBULATORY_CARE_PROVIDER_SITE_OTHER): Payer: Self-pay | Admitting: Internal Medicine

## 2024-06-11 ENCOUNTER — Other Ambulatory Visit (INDEPENDENT_AMBULATORY_CARE_PROVIDER_SITE_OTHER): Payer: Self-pay | Admitting: Medical

## 2024-07-08 ENCOUNTER — Encounter (INDEPENDENT_AMBULATORY_CARE_PROVIDER_SITE_OTHER): Admitting: Internal Medicine

## 2024-07-08 DIAGNOSIS — R55 Syncope and collapse: Secondary | ICD-10-CM

## 2024-07-08 DIAGNOSIS — Z95818 Presence of other cardiac implants and grafts: Secondary | ICD-10-CM

## 2024-07-09 ENCOUNTER — Other Ambulatory Visit (INDEPENDENT_AMBULATORY_CARE_PROVIDER_SITE_OTHER): Payer: Self-pay | Admitting: Internal Medicine

## 2024-07-10 ENCOUNTER — Other Ambulatory Visit (INDEPENDENT_AMBULATORY_CARE_PROVIDER_SITE_OTHER): Payer: Self-pay | Admitting: Medical

## 2024-07-15 ENCOUNTER — Other Ambulatory Visit: Payer: Self-pay

## 2024-07-27 ENCOUNTER — Other Ambulatory Visit: Payer: Self-pay

## 2024-07-31 ENCOUNTER — Other Ambulatory Visit (INDEPENDENT_AMBULATORY_CARE_PROVIDER_SITE_OTHER): Payer: Self-pay | Admitting: Internal Medicine

## 2024-08-04 ENCOUNTER — Other Ambulatory Visit: Payer: Self-pay

## 2024-08-08 ENCOUNTER — Encounter (INDEPENDENT_AMBULATORY_CARE_PROVIDER_SITE_OTHER): Admitting: Internal Medicine

## 2024-08-08 DIAGNOSIS — Z95818 Presence of other cardiac implants and grafts: Secondary | ICD-10-CM

## 2024-08-08 DIAGNOSIS — R55 Syncope and collapse: Secondary | ICD-10-CM

## 2024-08-10 ENCOUNTER — Other Ambulatory Visit (HOSPITAL_BASED_OUTPATIENT_CLINIC_OR_DEPARTMENT_OTHER): Payer: Self-pay | Admitting: Medical

## 2024-08-10 ENCOUNTER — Other Ambulatory Visit: Payer: Self-pay

## 2024-08-10 DIAGNOSIS — M25562 Pain in left knee: Secondary | ICD-10-CM

## 2024-08-11 ENCOUNTER — Other Ambulatory Visit: Payer: Self-pay

## 2024-08-13 ENCOUNTER — Other Ambulatory Visit: Payer: Self-pay

## 2024-08-17 ENCOUNTER — Ambulatory Visit (HOSPITAL_BASED_OUTPATIENT_CLINIC_OR_DEPARTMENT_OTHER)
Admission: RE | Admit: 2024-08-17 | Discharge: 2024-08-17 | Disposition: A | Discharge status: Home / Self Care | Source: Ambulatory Visit | Admitting: Radiology

## 2024-08-17 ENCOUNTER — Other Ambulatory Visit: Payer: Self-pay

## 2024-08-17 ENCOUNTER — Ambulatory Visit: Payer: Self-pay | Admitting: Medical

## 2024-08-17 ENCOUNTER — Encounter (HOSPITAL_BASED_OUTPATIENT_CLINIC_OR_DEPARTMENT_OTHER): Payer: Self-pay | Admitting: Medical

## 2024-08-17 VITALS — Ht 62.0 in | Wt 158.5 lb

## 2024-08-17 DIAGNOSIS — M25561 Pain in right knee: Secondary | ICD-10-CM

## 2024-08-17 DIAGNOSIS — M170 Bilateral primary osteoarthritis of knee: Secondary | ICD-10-CM | POA: Insufficient documentation

## 2024-08-17 DIAGNOSIS — M25562 Pain in left knee: Secondary | ICD-10-CM | POA: Insufficient documentation

## 2024-08-17 NOTE — Progress Notes (Signed)
 Orthopaedics & Sports Medicine  [address]  [place], [place] [address]  [phone]       OFFICE VISIT    PATIENT NAME:  [name] [name]  MEDICAL RECORD NUMBER: [id number]    DICTATING PHYSICIAN:  Eva Fish, PA-C  REFERRING PHYSICIAN:  No ref. provider found    DOB:  [birth year]  DOS:  08/17/2024    CHIEF COMPLAINT:  Bilateral Knee Pain (Right> Left)      HISTORY OF PRESENT ILLNESS:  [name] [name] is a 83 y.o. female.  Comes to the office today for follow up consultation in regards to ongoing right > left knee pain.  Patient states that this has been ongoing for many years.  she cannot recall one specific injury that caused the pain.  Patient tells me that their symptoms are worse with weight bearing activities and ambulating on uneven ground and downward grades.  she has pain that awakens them from sleeping. In addition to modifying the activities that create pain, she has attempted NSAIDs, tylenol , steroid injections, Visco injections, PRP injections, Zilretta  in the right knee. She last had a Zilretta  injection on 05/19/24 and states that it did not help.  She has multiple health issues including cardiac problems, h/o stroke.  At this point the patient is here today to discuss further evaluation of their knee pain.   She is currently scheduled to undergo a right TKA on 03/02/25.    PAST MEDICAL HISTORY:  Past Medical History:   Diagnosis Date    Cancer (CMS [facility])     skin cancer -- forehead  2024    Coronary artery disease     CVA (cerebral vascular accident) 2009    Dyspnea on exertion     Essential hypertension     Hyperlipidemia     PONV (postoperative nausea and vomiting)     Skin cancer     Stented coronary artery     Type 2 diabetes mellitus            PAST SURGICAL HISTORY:  Past Surgical History:   Procedure Laterality Date    CARDIAC CATHETERIZATION      CATARACT EXTRACTION, BILATERAL Bilateral     HX COLONOSCOPY      HX HEART CATHETERIZATION  2017 and 2018    Stented twice  prox circ  and circ    HX  PARTIAL HYSTERECTOMY      tubes and ovaries left intact    HX THYROIDECTOMY  1978    HX WISDOM TEETH EXTRACTION      KNEE SURGERY  2018    muscle removed    LAPAROSCOPIC CHOLECYSTECTOMY      RECONSTRUCTION OF NOSE  1980    SKIN CANCER EXCISION      left side forehead           MEDICATIONS:  Current Outpatient Medications   Medication Sig    ALPRAZolam  (XANAX ) 0.5 mg Oral Tablet Take 1 Tablet (0.5 mg total) by mouth Every evening    amLODIPine  (NORVASC ) 5 mg Oral Tablet Take 1 Tablet (5 mg total) by mouth Once a day    bempedoic acid  180 mg Oral Tablet Take 1 Tablet (180 mg total) by mouth Once a day    Cholecalciferol, Vitamin D3, 10 mcg (400 unit) Oral Tablet, Chewable Chew Once a day    clopidogreL  (PLAVIX ) 75 mg Oral Tablet Take 1 Tablet (75 mg total) by mouth Daily    empagliflozin  (JARDIANCE ) 10 mg Oral Tablet Take 1 Tablet (  10 mg total) by mouth Once a day    evolocumab  (REPATHA  SURECLICK) 140 mg/mL Subcutaneous Pen Injector Inject 1 pen (140 mg) subcutaneously (under the skin) once every 14 days    ezetimibe  (ZETIA ) 10 mg Oral Tablet TAKE 1 TABLET BY MOUTH EVERY DAY IN THE EVENING    fluticasone  propionate (FLONASE  NASL) by Nasal route    Irbesartan-Hydrochlorothiazide  300-12.5 mg Oral Tablet Take 300 mg by mouth Once a day    Isosorbide  Mononitrate (IMDUR ) 120 mg Oral Tablet Sustained Release 24 hr TAKE 1 TABLET BY MOUTH EVERY MORNING.    levothyroxine  (SYNTHROID ) 112 mcg Oral Tablet Take 1 Tablet (112 mcg total) by mouth Every morning    metoprolol  succinate (TOPROL -XL) 50 mg Oral Tablet Sustained Release 24 hr Take 1 Tablet (50 mg total) by mouth Once a day    nitroGLYCERIN  (NITROSTAT ) 0.4 mg Sublingual Tablet, Sublingual PLACE 1 TABLET UNDER TONGUE EVERY 5 MINS, UP TO 3 DOSES AS NEEDED FOR CHEST PAIN    pantoprazole  (PROTONIX ) 40 mg Oral Tablet, Delayed Release (E.C.) Take 1 Tablet (40 mg total) by mouth Daily    POTASSIUM-99 ORAL Take by mouth Once a day    ranolazine  (RANEXA ) 500 mg Oral Tablet  Sustained Release 12 hr TAKE 1 TABLET BY MOUTH TWICE DAILY.    valACYclovir (VALTREX) 500 mg Oral Tablet Take 1 Tablet (500 mg total) by mouth Twice daily    vitamin B complex (B COMPLEX-VITAMIN B12 ORAL) Take by mouth Once a day       ALLERGIES:  Allergy History as of 08/17/24       IV CONTRAST         Noted Status Severity Type Reaction    08/28/19 1339 Albertson, Margarie, LPN 90/74/79 Active Low  Hives/ Urticaria              NICKEL         Noted Status Severity Type Reaction    08/28/19 1339 [place], Margarie, LPN 90/74/79 Active Low  Hives/ Urticaria              ATORVASTATIN         Noted Status Severity Type Reaction    05/24/21 1302 Calain, Amy, RN 05/24/21 Active       Comments: Muscle ache               ROSUVASTATIN         Noted Status Severity Type Reaction    05/24/21 1303 Calain, Amy, RN 05/24/21 Active       Comments: Muscle aches               LOVASTATIN         Noted Status Severity Type Reaction    05/24/21 1303 Calain, Amy, RN 05/24/21 Active                 IOHEXOL         Noted Status Severity Type Reaction    01/18/22 0401 Ritta Florence, RN 08/15/05 Active Medium Side Effect  Other Adverse Reaction (Add comment), Hives/ Urticaria    Comments:  Desc: HIVES SEVERAL HRS S/P IV CONTRAST.. [place] LAST SCAN W/ BENADRYL  PREMED @ [facility] '01/AC.     07/26/21 1424 Flesher, Fairy SQUIBB, LPN 90/86/93 Active Medium  Hives/ Urticaria,  Other Adverse Reaction (Add comment)    Comments:  Desc: HIVES SEVERAL HRS S/P IV CONTRAST.. [place] LAST SCAN W/ BENADRYL  PREMED @ [facility] '01/AC.  LISINOPRIL         Noted Status Severity Type Reaction    01/18/22 0402 Ritta Florence, RN 05/25/14 Active Medium Side Effect  Other Adverse Reaction (Add comment)    Comments: Other reaction(s): Cough     07/26/21 1424 Elenore Fairy SQUIBB, LPN 93/76/84 Active Medium      Comments: Other reaction(s): Cough               PNEUMOCOCCAL 23-VALENT POLYSACCHARIDE VACCINE         Noted Status Severity Type Reaction    01/18/22 0401 Ritta Florence, RN  05/25/14 Active High Systemic  Other Adverse Reaction (Add comment)    Comments: Severe local swelling     07/26/21 1424 Elenore Fairy SQUIBB, LPN 93/76/84 Active High   Other Adverse Reaction (Add comment)    Comments: Severe local swelling               TRAMADOL         Noted Status Severity Type Reaction    01/18/22 0401 Ritta Florence, RN 10/21/18 Active High Side Effect Nausea/ Vomiting    Comments: makes me sick with or without food     07/26/21 1424 Elenore Fairy SQUIBB, LPN 88/80/80 Active High  Nausea/ Vomiting    Comments: makes me sick with or without food               OTHER         Noted Status Severity Type Reaction    01/18/22 0401 Ritta Florence, RN 01/12/16 Active Medium Side Effect Rash    Comments: Other reaction(s): Other (See Comments)  Nickle   When teeth were wired in she place, patient obtained an infection and wiring had to be removed,  Also allergic to silver     07/26/21 1425 Elenore Fairy SQUIBB, LPN 97/90/82 Active Medium  Rash    Comments: Other reaction(s): Other (See Comments)  Nickle   When teeth were wired in she place, patient obtained an infection and wiring had to be removed,  Also allergic to silver               SILVER         Noted Status Severity Type Reaction    05/19/24 1108 Hilma [place], [place] 05/19/24 Active Medium  Rash                    FAMILY HISTORY:  Family Medical History:       Problem Relation (Age of Onset)    Cardiomyopathy Father    Heart Attack Mother    Heart Disease Sister    No Known Problems Brother, Maternal Grandmother, Maternal Grandfather, Paternal Grandmother, Paternal Grandfather, Daughter, Son, Maternal Aunt, Maternal Uncle, Paternal Aunt, Paternal Uncle, Other    Uterine Cancer Mother, Niece                SOCIAL HISTORY:  Social History     Socioeconomic History    Marital status: Married     Spouse name: Not on file    Number of children: Not on file    Years of education: Not on file    Highest education level: Not on file   Occupational History    Not  on file   Tobacco Use    Smoking status: Former     Current packs/day: 0.50     Average packs/day: 0.5 packs/day for 10.0 years (5.0 ttl pk-yrs)     Types: Cigarettes  Passive exposure: Past    Smokeless tobacco: Never   Vaping Use    Vaping status: Never Used   Substance and Sexual Activity    Alcohol use: Not Currently     Alcohol/week: 1.0 standard drink of alcohol     Types: 1 Cans of beer per week     Comment: drank long time ago not current.    Drug use: Never    Sexual activity: Not Currently   Other Topics Concern    Ability to Walk 1 Flight of Steps without SOB/CP No    Routine Exercise Not Asked    Ability to Walk 2 Flight of Steps without SOB/CP Not Asked    Unable to Ambulate Not Asked    Total Care Not Asked    Ability To Do Own ADL's Yes    Uses Walker Not Asked    Other Activity Level Not Asked    Uses Cane Not Asked   Social History Narrative    Not on file     Social Determinants of Health     Financial Resource Strain: Not on file   Transportation Needs: Not on file   Social Connections: Not on file   Intimate Partner Violence: Not on file   Housing Stability: Not on file       REVIEW OF SYSTEMS  Constitutional: negative  Musculoskeletal:positive for Right knee pain  All other ROS Negative    PHYSICAL EXAMINATION:    Ht 1.575 m (5' 2)   Wt 71.9 kg (158 lb 8.2 oz)   BMI 28.99 kg/m         GENERAL: she is alert, cooperative, no distress, appears stated age.      ORTHOPEDIC EVALUATION:    Patient displays an abnormal gait.  Patient favors the rightlower extremity.  Gross inspection reveals normal mechanical and anatomic axis of the lower extremities with no appreciable leg length discrepancy.  No tenderness found with AP or lateral compression testing of the pelvis.    Right hip shows full PROM (Forward flexion 0-115 degrees, internal rotation 0-35 degrees, external rotation 0-45 degrees). McCarthy's Maneuvers are negative for impingement. FABRE testing negative. No tenderness to palpation over  the greater trochanter.  Straight leg testing is negative for radiculopathy.  Compartments are supple.  Skin is clear.  +5/5 hip flexors/extenders, as well as, knee flexors and extenders.  Intact sensory exam along all dermatomes.    Left hip shows full PROM (Forward flexion 0-115 degrees, internal rotation 0-35 degrees, external rotation 0-45 degrees). McCarthy's Maneuvers are negative for impingement. FABRE testing negative. No tenderness to palpation over the greater trochanter.  Straight leg testing is negative for radiculopathy.  Compartments are supple.  Skin is clear.  +5/5 hip flexors/extenders, as well as, knee flexors and extenders.  Intact sensory exam along all dermatomes.    Right knee reveals a decrease in ROM from 0 to 105 degrees. Positive Clark's patella-femoral compression test for crepitus and pain.  Trace effusion is present.  Lachman's test is negative.  Posterior Drawer test is negative.  Collateral ligamental testing demonstrates pseudo-laxity over the MCL.  McMurrary testing is negative.  no Baker's cyst is present.  Skin is clear.  Normoreflexic.   Left knee reveals a decrease in ROM from 0 to 110 degrees. Positive Clark's patella-femoral compression test for crepitus and pain.  Trace effusion is present.  Lachman's test is negative.  Posterior Drawer test is negative.  Collateral ligamental testing demonstrates pseudo-laxity over the MCL.  McMurrary testing is negative.  no Baker's cyst is present.  Skin is clear.  Normoreflexic.    Right Ankle shows full PROM in plantar and dorsiflexion.  Stable to ligamental testing.  Skin is clear. +5/5 motor strength to plantar/dorsiflexion, inversion/eversion, and EHL.  Intact sensory to light-touch.  +2/4 dorsalis pedis and posterior tibial pulse.    Left Ankle shows full PROM in plantar and dorsiflexion.  Stable to ligamental testing.  Skin is clear. +5/5 motor strength to plantar/dorsiflexion, inversion/eversion, and EHL.  Intact sensory to  light-touch.  +2/4 dorsalis pedis and posterior tibial pulse.          IMAGING:    PROCEDURE DESCRIPTION: XR KNEE LEFT 4 OR MORE VIEWS     CLINICAL INDICATION: M25.561: Bilateral knee pain  M25.562: Bilateral knee pain     TECHNIQUE: 4 views / 4 images submitted.     COMPARISON: No prior studies were compared.        FINDINGS: 4 views left knee show narrowing of the medial knee joint compartment with a bone-on-bone configuration and subchondral sclerosis. There is varus deviation of the lower leg and tricompartmental osteophyte formation. Small joint effusion noted with no fracture.     IMPRESSION:  Joint effusion. Severe degenerative arthrosis.         PROCEDURE DESCRIPTION: XR KNEE RIGHT 4 OR MORE VIEWS     CLINICAL INDICATION: M25.561: Bilateral knee pain  M25.562: Bilateral knee pain     TECHNIQUE: 4 views / 4 images submitted.     COMPARISON: 02/17/2024        FINDINGS: 4 views right knee show narrowing of the medial knee joint compartment with a bone-on-bone configuration and subchondral sclerosis. There is deviation of the lower leg and tricompartmental osteophyte formation. Small joint effusion noted with no acute fracture.     IMPRESSION:  Severe degenerative arthrosis, similar to the prior study.    IMPRESSION:      ICD-10-CM    1. Primary osteoarthritis of both knees  M17.0 Gelsyn-3 Joint Injection Schedule/Auth            PLAN:          At this point I have discussed with the patient today my evaluation and recommended treatment alternatives.  We have discussed the pros and cons of both conservative, as well as, surgical intervention.After discussion we are going to continue with conservative management. I have reviewed alternative to exercise including aquatic therapy.  We discussed pharmacologic treatment with current medications.  In addition, injection management will be initiated with Gelsyn-3 once we have insurance approval.  All questions were answered to their completeness.  We will continue to  monitor their symptoms as an outpatient.     We will continue with the right TKA as scheduled.             This note was partially generated using MModal Fluency Direct system, and there may be some incorrect words, spellings, and punctuation that were not noted in checking the note before saving.    I am scribing for, and in the presence of, Eva Fish, MMS, PA-C for services provided on 08/17/2024.  Cecillia Friend, MA, SCRIBE      I personally performed the services described in this documentation, as scribed in my presence, and it is both accurate and complete.  I have reviewed the note and made changes where needed.  I agree with the plan as above.    Eva Fish, PA-C 08/17/2024  [facility] - Orthopaedics & Sports Medicine  [facility]

## 2024-08-19 ENCOUNTER — Telehealth (HOSPITAL_BASED_OUTPATIENT_CLINIC_OR_DEPARTMENT_OTHER): Payer: Self-pay | Admitting: Medical

## 2024-08-19 NOTE — Telephone Encounter (Signed)
 08/19/24 @ 0901 Spoke with pt and scheduled three Gelsyn 3 bil knee starting on 08/27/24. She agreed with dates and times. Reminder letters mailed.    Kimberly D Perrine

## 2024-08-27 ENCOUNTER — Ambulatory Visit: Payer: Self-pay | Attending: Medical | Admitting: Medical

## 2024-08-27 ENCOUNTER — Other Ambulatory Visit: Payer: Self-pay

## 2024-08-27 ENCOUNTER — Encounter (HOSPITAL_BASED_OUTPATIENT_CLINIC_OR_DEPARTMENT_OTHER): Payer: Self-pay | Admitting: Medical

## 2024-08-27 VITALS — Ht 62.0 in | Wt 154.4 lb

## 2024-08-27 DIAGNOSIS — M170 Bilateral primary osteoarthritis of knee: Secondary | ICD-10-CM | POA: Insufficient documentation

## 2024-08-27 DIAGNOSIS — M1711 Unilateral primary osteoarthritis, right knee: Secondary | ICD-10-CM

## 2024-08-27 MED ORDER — BUPIVACAINE HCL 0.5 % (5 MG/ML) INJECTION SOLUTION
3.0000 mL | Freq: Once | INTRAMUSCULAR | Status: AC
Start: 2024-08-27 — End: 2024-08-27
  Administered 2024-08-27: 3 mL via INTRAMUSCULAR

## 2024-08-27 MED ORDER — SODIUM HYALURONATE 16.8 MG/2 ML INTRA-ARTICULAR SYRINGE
2.0000 mL | INJECTION | Freq: Once | INTRA_ARTICULAR | Status: AC
Start: 2024-08-27 — End: 2024-08-27
  Administered 2024-08-27: 2 mL via INTRA_ARTICULAR

## 2024-08-27 NOTE — Addendum Note (Signed)
 Addended by: JEANELLE JANETH RATTAN on: 08/27/2024 11:13 AM     Modules accepted: Orders

## 2024-08-27 NOTE — Progress Notes (Signed)
 Orthopaedics & Sports Medicine  [address]  [place], [place] [address]  [phone]      Knee Injection Visit    PATIENT NAME:  [name] [name]  MEDICAL RECORD NUMBER [id number]    DICTATING PHYSICIAN:  Eva Fish, PA-C  REFERRING PHYSICIAN:  Eva Fish, PA-C    DOB:  1941/09/24  DOS:  08/27/2024     CC:  Right Knee Injections Gelsyn-3 #1    HISTORY OF PRESENT ILLNESS:    [name] [name] returns today for a right knee viscosupplementation injection for the management of their arthritic symptoms.   Patient states that their arthritis symptoms remain the same since their last visit.  she continues to take tylenol  at home for her symptoms.  Patient currently denies any fevers or chills.  No reports of any illnesses.      Allergies[1]     Past Medical History:   Diagnosis Date    Cancer (CMS [facility])     skin cancer -- forehead  2024    Coronary artery disease     CVA (cerebral vascular accident) 2009    Dyspnea on exertion     Essential hypertension     Hyperlipidemia     PONV (postoperative nausea and vomiting)     Skin cancer     Stented coronary artery     Type 2 diabetes mellitus             Past Surgical History:   Procedure Laterality Date    CARDIAC CATHETERIZATION      CATARACT EXTRACTION, BILATERAL Bilateral     HX COLONOSCOPY      HX HEART CATHETERIZATION  2017 and 2018    Stented twice  prox circ  and circ    HX PARTIAL HYSTERECTOMY      tubes and ovaries left intact    HX THYROIDECTOMY  1978    HX WISDOM TEETH EXTRACTION      KNEE SURGERY  2018    muscle removed    LAPAROSCOPIC CHOLECYSTECTOMY      RECONSTRUCTION OF NOSE  1980    SKIN CANCER EXCISION      left side forehead            Current Outpatient Medications   Medication Sig    ALPRAZolam  (XANAX ) 0.5 mg Oral Tablet Take 1 Tablet (0.5 mg total) by mouth Every evening    amLODIPine  (NORVASC ) 5 mg Oral Tablet Take 1 Tablet (5 mg total) by mouth Once a day    bempedoic acid  180 mg Oral Tablet Take 1 Tablet (180 mg total) by mouth Once a day    Cholecalciferol,  Vitamin D3, 10 mcg (400 unit) Oral Tablet, Chewable Chew Once a day    clopidogreL  (PLAVIX ) 75 mg Oral Tablet Take 1 Tablet (75 mg total) by mouth Daily    empagliflozin  (JARDIANCE ) 10 mg Oral Tablet Take 1 Tablet (10 mg total) by mouth Once a day    evolocumab  (REPATHA  SURECLICK) 140 mg/mL Subcutaneous Pen Injector Inject 1 pen (140 mg) subcutaneously (under the skin) once every 14 days    ezetimibe  (ZETIA ) 10 mg Oral Tablet TAKE 1 TABLET BY MOUTH EVERY DAY IN THE EVENING    fluticasone  propionate (FLONASE  NASL) by Nasal route    Irbesartan-Hydrochlorothiazide  300-12.5 mg Oral Tablet Take 300 mg by mouth Once a day    Isosorbide  Mononitrate (IMDUR ) 120 mg Oral Tablet Sustained Release 24 hr TAKE 1 TABLET BY MOUTH EVERY MORNING.    levothyroxine  (SYNTHROID ) 112  mcg Oral Tablet Take 1 Tablet (112 mcg total) by mouth Every morning    metoprolol  succinate (TOPROL -XL) 50 mg Oral Tablet Sustained Release 24 hr Take 1 Tablet (50 mg total) by mouth Once a day    nitroGLYCERIN  (NITROSTAT ) 0.4 mg Sublingual Tablet, Sublingual PLACE 1 TABLET UNDER TONGUE EVERY 5 MINS, UP TO 3 DOSES AS NEEDED FOR CHEST PAIN    pantoprazole  (PROTONIX ) 40 mg Oral Tablet, Delayed Release (E.C.) Take 1 Tablet (40 mg total) by mouth Daily    POTASSIUM-99 ORAL Take by mouth Once a day    ranolazine  (RANEXA ) 500 mg Oral Tablet Sustained Release 12 hr TAKE 1 TABLET BY MOUTH TWICE DAILY.    valACYclovir (VALTREX) 500 mg Oral Tablet Take 1 Tablet (500 mg total) by mouth Twice daily    vitamin B complex (B COMPLEX-VITAMIN B12 ORAL) Take by mouth Once a day        PHYSICAL EXAMINATION:    Ht 1.575 m (5' 2)   Wt 70 kg (154 lb 6.4 oz)   BMI 28.24 kg/m         Orthopedic Evaluation:    Patient displays an antalgic gait favoring the right side.     Right knee:  Crepitus and pain during ROM testing.   Clarke's patellofemoral compression test positive for crepitus and pain.  Trace effusion. Ligamental testing stable.  Previous injection sites clear.    Neurovascular:  Patient displays symmetric muscle strength.  No obvious gross abnormalities.  5/5 motor strength.  Intact sensory evaluation.  +2/4 dorsalis pedis pulse.  Skin clear.     ASSESSMENT:      ICD-10-CM    1. Primary osteoarthritis of both knees  M17.0            PROCEDURE:   I reviewed with the patient today the risks, benefits, rationale, expected outcomes and potential complications of undergoing injection management.  Consent has been obtained and a signed copy has been placed on the chart.  Patient identified their right knee was to be injected.   The patient was positioned with their knees dangling from a seated position.  The anterior lateral portal of the right knee was prepped using sterile technique.  A 1 unit vial of Gelsyn-3 was injected  into the right knee.  The patient tolerated this without complaints.      PLAN:  The patient was educated once again on the goals of visco supplementation.  she was once again reminded to take it easy the rest of the day.  Ice is an option for symptoms today.  she will return for their second round of injections next week.  Questions were encouraged and answered.      This note was partially generated using MModal Fluency Direct system, and there may be some incorrect words, spellings, and punctuation that were not noted in checking the note before saving.    Eva Fish, PA-C     [facility] - Orthopaedics & Sports Medicine   Medicine              [1]   Allergies  Allergen Reactions    Pneumococcal 23-Valent Polysaccharide Vaccine  Other Adverse Reaction (Add comment)     Severe local swelling    Tramadol Nausea/ Vomiting     makes me sick with or without food    Iohexol  Other Adverse Reaction (Add comment) and Hives/ Urticaria      Desc: HIVES SEVERAL HRS S/P IV CONTRAST.. [place] LAST  SCAN W/ BENADRYL  PREMED @ [facility] '01/AC.    Lisinopril  Other Adverse Reaction (Add comment)     Other reaction(s): Cough    Other Rash     Other reaction(s):  Other (See Comments)  Nickle   When teeth were wired in she place, patient obtained an infection and wiring had to be removed,  Also allergic to silver    Silver Rash    Crestor [Rosuvastatin]      Muscle aches    Lovastatin     Statin [Atorvastatin]      Muscle ache    Iv Contrast Hives/ Urticaria    Nickel Hives/ Urticaria

## 2024-08-28 ENCOUNTER — Other Ambulatory Visit: Payer: Self-pay

## 2024-09-03 ENCOUNTER — Ambulatory Visit: Payer: Self-pay | Attending: Medical | Admitting: Medical

## 2024-09-03 ENCOUNTER — Encounter (HOSPITAL_BASED_OUTPATIENT_CLINIC_OR_DEPARTMENT_OTHER): Payer: Self-pay | Admitting: Medical

## 2024-09-03 ENCOUNTER — Other Ambulatory Visit: Payer: Self-pay

## 2024-09-03 VITALS — Ht 62.0 in | Wt 154.0 lb

## 2024-09-03 DIAGNOSIS — M1711 Unilateral primary osteoarthritis, right knee: Secondary | ICD-10-CM | POA: Insufficient documentation

## 2024-09-03 MED ORDER — BUPIVACAINE HCL 0.5 % (5 MG/ML) INJECTION SOLUTION
3.0000 mL | Freq: Once | INTRAMUSCULAR | Status: AC
Start: 2024-09-03 — End: 2024-09-03
  Administered 2024-09-03: 3 mL via INTRAMUSCULAR

## 2024-09-03 MED ORDER — SODIUM HYALURONATE 16.8 MG/2 ML INTRA-ARTICULAR SYRINGE
2.0000 mL | INJECTION | Freq: Once | INTRA_ARTICULAR | Status: AC
Start: 2024-09-03 — End: 2024-09-03
  Administered 2024-09-03: 2 mL via INTRA_ARTICULAR

## 2024-09-03 NOTE — Progress Notes (Signed)
 Orthopaedics & Sports Medicine  [address]  [place], [place] [address]  [phone]      Knee Injection Visit    PATIENT NAME:  [name] [name]  MEDICAL RECORD NUMBER [id number]    DICTATING PHYSICIAN:  Eva Fish, PA-C  REFERRING PHYSICIAN:  No ref. provider found    DOB:  [birth year]  DOS:  09/03/2024      CC:  Right Knee Injections Gelsyn-3 #2    HISTORY OF PRESENT ILLNESS:    [name] [name] returns today for a a right knee viscosupplementation injection for the management of their arthritic symptoms.   Patient states that their symptoms have improved since the first injection.   she continues to take Tylenol  at home for her symptoms.  Patient currently denies any fevers or chills.  No reports of any illnesses.  No side effects reported from the first injection.     Allergies[1]     Past Medical History:   Diagnosis Date    Cancer (CMS [facility])     skin cancer -- forehead  2024    Coronary artery disease     CVA (cerebral vascular accident) 2009    Dyspnea on exertion     Essential hypertension     Hyperlipidemia     PONV (postoperative nausea and vomiting)     Skin cancer     Stented coronary artery     Type 2 diabetes mellitus             Past Surgical History:   Procedure Laterality Date    CARDIAC CATHETERIZATION      CATARACT EXTRACTION, BILATERAL Bilateral     HX COLONOSCOPY      HX HEART CATHETERIZATION  2017 and 2018    Stented twice  prox circ  and circ    HX PARTIAL HYSTERECTOMY      tubes and ovaries left intact    HX THYROIDECTOMY  1978    HX WISDOM TEETH EXTRACTION      KNEE SURGERY  2018    muscle removed    LAPAROSCOPIC CHOLECYSTECTOMY      RECONSTRUCTION OF NOSE  1980    SKIN CANCER EXCISION      left side forehead            Current Outpatient Medications   Medication Sig    ALPRAZolam  (XANAX ) 0.5 mg Oral Tablet Take 1 Tablet (0.5 mg total) by mouth Every evening    amLODIPine  (NORVASC ) 5 mg Oral Tablet Take 1 Tablet (5 mg total) by mouth Once a day    bempedoic acid  180 mg Oral Tablet Take 1 Tablet (180  mg total) by mouth Once a day    Cholecalciferol, Vitamin D3, 10 mcg (400 unit) Oral Tablet, Chewable Chew Once a day    clopidogreL  (PLAVIX ) 75 mg Oral Tablet Take 1 Tablet (75 mg total) by mouth Daily    empagliflozin  (JARDIANCE ) 10 mg Oral Tablet Take 1 Tablet (10 mg total) by mouth Once a day    evolocumab  (REPATHA  SURECLICK) 140 mg/mL Subcutaneous Pen Injector Inject 1 pen (140 mg) subcutaneously (under the skin) once every 14 days    ezetimibe  (ZETIA ) 10 mg Oral Tablet TAKE 1 TABLET BY MOUTH EVERY DAY IN THE EVENING    fluticasone  propionate (FLONASE  NASL) by Nasal route    Irbesartan-Hydrochlorothiazide  300-12.5 mg Oral Tablet Take 300 mg by mouth Once a day    Isosorbide  Mononitrate (IMDUR ) 120 mg Oral Tablet Sustained Release 24 hr TAKE 1 TABLET  BY MOUTH EVERY MORNING.    levothyroxine  (SYNTHROID ) 112 mcg Oral Tablet Take 1 Tablet (112 mcg total) by mouth Every morning    metoprolol  succinate (TOPROL -XL) 50 mg Oral Tablet Sustained Release 24 hr Take 1 Tablet (50 mg total) by mouth Once a day    nitroGLYCERIN  (NITROSTAT ) 0.4 mg Sublingual Tablet, Sublingual PLACE 1 TABLET UNDER TONGUE EVERY 5 MINS, UP TO 3 DOSES AS NEEDED FOR CHEST PAIN    pantoprazole  (PROTONIX ) 40 mg Oral Tablet, Delayed Release (E.C.) Take 1 Tablet (40 mg total) by mouth Daily    POTASSIUM-99 ORAL Take by mouth Once a day    ranolazine  (RANEXA ) 500 mg Oral Tablet Sustained Release 12 hr TAKE 1 TABLET BY MOUTH TWICE DAILY.    valACYclovir (VALTREX) 500 mg Oral Tablet Take 1 Tablet (500 mg total) by mouth Twice daily    vitamin B complex (B COMPLEX-VITAMIN B12 ORAL) Take by mouth Once a day        PHYSICAL EXAMINATION:    Ht 1.575 m (5' 2)   Wt 69.9 kg (154 lb)   BMI 28.17 kg/m         Orthopedic Evaluation:    Patient displays an antalgic gait favoring the right side.     Right knee:  Crepitus and pain during ROM testing.   Clarke's patellofemoral compression test positive for crepitus and pain.  Trace effusion. Ligamental testing  stable.  Previous injection sites clear.   Neurovascular:  Patient displays symmetric muscle strength.  No obvious gross abnormalities.  5/5 motor strength.  Intact sensory evaluation.  +2/4 dorsalis pedis pulse.  Skin clear.     ASSESSMENT:      ICD-10-CM    1. Primary osteoarthritis of right knee  M17.11            PROCEDURE:   I reviewed with the patient today the risks, benefits, rationale, expected outcomes and potential complications of undergoing Supartz injection management.  Consent has been obtained and a signed copy has been placed on the chart.  Patient identified their right knee was to be injected.   The patient was positioned with their knees dangling from a seated position.  The anterior lateral portal of the right knee was prepped using sterile technique.  A 1 unit vial of Supartz was injected  into the right knee.  The patient tolerated this without complaints.      PLAN:  The patient was educated once again on the goals of visco supplementation.  she was once again reminded to take it easy the rest of the day.  Ice is an option for symptoms today.  she will return for their third round of injections next week.  Questions were encouraged and answered.        TIME OUT: A time out was performed at 11:55  to confirm the correct patient, procedure, and site.  Cecillia Friend, MA      This note was partially generated using MModal Fluency Direct system, and there may be some incorrect words, spellings, and punctuation that were not noted in checking the note before saving.    I am scribing for, and in the presence of, Eva Fish, MMS, PA-C for services provided on 09/03/2024.  Cecillia Friend, MA, SCRIBE      I personally performed the services described in this documentation, as scribed in my presence, and it is both accurate and complete.  I have reviewed the note and made changes where needed.  I agree with  the plan as above.    Eva Fish, PA-C       [facility] - Orthopaedics & Sports  Medicine  [facility]                [1]   Allergies  Allergen Reactions    Pneumococcal 23-Valent Polysaccharide Vaccine  Other Adverse Reaction (Add comment)     Severe local swelling    Tramadol Nausea/ Vomiting     makes me sick with or without food    Iohexol  Other Adverse Reaction (Add comment) and Hives/ Urticaria      Desc: HIVES SEVERAL HRS S/P IV CONTRAST.. [place] LAST SCAN W/ BENADRYL  PREMED @ [facility] '01/AC.    Lisinopril  Other Adverse Reaction (Add comment)     Other reaction(s): Cough    Other Rash     Other reaction(s): Other (See Comments)  Nickle   When teeth were wired in she place, patient obtained an infection and wiring had to be removed,  Also allergic to silver    Silver Rash    Crestor [Rosuvastatin]      Muscle aches    Lovastatin     Statin [Atorvastatin]      Muscle ache    Iv Contrast Hives/ Urticaria    Nickel Hives/ Urticaria

## 2024-09-06 ENCOUNTER — Other Ambulatory Visit (INDEPENDENT_AMBULATORY_CARE_PROVIDER_SITE_OTHER): Payer: Self-pay | Admitting: Internal Medicine

## 2024-09-08 ENCOUNTER — Ambulatory Visit: Attending: Internal Medicine | Admitting: Internal Medicine

## 2024-09-08 DIAGNOSIS — R55 Syncope and collapse: Secondary | ICD-10-CM | POA: Insufficient documentation

## 2024-09-08 DIAGNOSIS — Z95818 Presence of other cardiac implants and grafts: Secondary | ICD-10-CM | POA: Insufficient documentation

## 2024-09-10 ENCOUNTER — Ambulatory Visit: Payer: Self-pay | Attending: Medical | Admitting: Medical

## 2024-09-10 ENCOUNTER — Other Ambulatory Visit: Payer: Self-pay

## 2024-09-10 DIAGNOSIS — M1711 Unilateral primary osteoarthritis, right knee: Secondary | ICD-10-CM | POA: Insufficient documentation

## 2024-09-10 MED ORDER — SODIUM HYALURONATE 16.8 MG/2 ML INTRA-ARTICULAR SYRINGE
2.0000 mL | INJECTION | Freq: Once | INTRA_ARTICULAR | Status: AC
Start: 2024-09-10 — End: 2024-09-10
  Administered 2024-09-10: 2 mL via INTRA_ARTICULAR

## 2024-09-10 MED ORDER — BUPIVACAINE HCL 0.5 % (5 MG/ML) INJECTION SOLUTION
3.0000 mL | Freq: Once | INTRAMUSCULAR | Status: AC
Start: 2024-09-10 — End: 2024-09-10
  Administered 2024-09-10: 3 mL via INTRAMUSCULAR

## 2024-09-10 NOTE — Progress Notes (Signed)
 Orthopaedics & Sports Medicine  [address]  [place], [place] [address]  [phone]      Knee Injection Visit    PATIENT NAME:  [name] [name]  MEDICAL RECORD NUMBER [id number]    DICTATING PHYSICIAN:  Eva Fish, PA-C  REFERRING PHYSICIAN:  Alan Muff, PA-C    DOB:  1941-02-21  DOS:  09/10/2024      CC:  Right Knee Injection of Gelsyn-3#3.    HISTORY OF PRESENT ILLNESS:    [name] [name] returns today for a right knee viscosupplementation injection for the management of their arthritic symptoms.   Patient states that their arthritis symptoms remain the same since their last visit.  she continues to take Tylenol  at home for her symptoms.  Patient currently denies any fevers or chills.  No reports of any illnesses.      Allergies[1]     Past Medical History:   Diagnosis Date    Cancer (CMS [facility])     skin cancer -- forehead  2024    Coronary artery disease     CVA (cerebral vascular accident) 2009    Dyspnea on exertion     Essential hypertension     Hyperlipidemia     PONV (postoperative nausea and vomiting)     Skin cancer     Stented coronary artery     Type 2 diabetes mellitus             Past Surgical History:   Procedure Laterality Date    CARDIAC CATHETERIZATION      CATARACT EXTRACTION, BILATERAL Bilateral     HX COLONOSCOPY      HX HEART CATHETERIZATION  2017 and 2018    Stented twice  prox circ  and circ    HX PARTIAL HYSTERECTOMY      tubes and ovaries left intact    HX THYROIDECTOMY  1978    HX WISDOM TEETH EXTRACTION      KNEE SURGERY  2018    muscle removed    LAPAROSCOPIC CHOLECYSTECTOMY      RECONSTRUCTION OF NOSE  1980    SKIN CANCER EXCISION      left side forehead            Current Outpatient Medications   Medication Sig    ALPRAZolam  (XANAX ) 0.5 mg Oral Tablet Take 1 Tablet (0.5 mg total) by mouth Every evening    amLODIPine  (NORVASC ) 5 mg Oral Tablet Take 1 Tablet (5 mg total) by mouth Once a day    bempedoic acid  180 mg Oral Tablet Take 1 Tablet (180 mg total) by mouth Once a day     Cholecalciferol, Vitamin D3, 10 mcg (400 unit) Oral Tablet, Chewable Chew Once a day    clopidogreL  (PLAVIX ) 75 mg Oral Tablet Take 1 Tablet (75 mg total) by mouth Daily    empagliflozin  (JARDIANCE ) 10 mg Oral Tablet Take 1 Tablet (10 mg total) by mouth Once a day    evolocumab  (REPATHA  SURECLICK) 140 mg/mL Subcutaneous Pen Injector Inject 1 pen (140 mg) subcutaneously (under the skin) once every 14 days    ezetimibe  (ZETIA ) 10 mg Oral Tablet TAKE 1 TABLET BY MOUTH EVERY DAY IN THE EVENING    fluticasone  propionate (FLONASE  NASL) by Nasal route    Irbesartan-Hydrochlorothiazide  300-12.5 mg Oral Tablet Take 300 mg by mouth Once a day    Isosorbide  Mononitrate (IMDUR ) 120 mg Oral Tablet Sustained Release 24 hr TAKE 1 TABLET BY MOUTH EVERY MORNING.    levothyroxine  (SYNTHROID )  112 mcg Oral Tablet Take 1 Tablet (112 mcg total) by mouth Every morning    metoprolol  succinate (TOPROL -XL) 50 mg Oral Tablet Sustained Release 24 hr Take 1 Tablet (50 mg total) by mouth Once a day    nitroGLYCERIN  (NITROSTAT ) 0.4 mg Sublingual Tablet, Sublingual PLACE 1 TABLET UNDER TONGUE EVERY 5 MINS, UP TO 3 DOSES AS NEEDED FOR CHEST PAIN    pantoprazole  (PROTONIX ) 40 mg Oral Tablet, Delayed Release (E.C.) Take 1 Tablet (40 mg total) by mouth Daily    POTASSIUM-99 ORAL Take by mouth Once a day    ranolazine  (RANEXA ) 500 mg Oral Tablet Sustained Release 12 hr TAKE 1 TABLET BY MOUTH TWICE DAILY.    valACYclovir (VALTREX) 500 mg Oral Tablet Take 1 Tablet (500 mg total) by mouth Twice daily    vitamin B complex (B COMPLEX-VITAMIN B12 ORAL) Take by mouth Once a day        PHYSICAL EXAMINATION:    There were no vitals taken for this visit.        Orthopedic Evaluation:    Patient displays an antalgic gait favoring the right side.     Right knee:  Crepitus and pain during ROM testing.   Clarke's patellofemoral compression test positive for crepitus and pain.  Trace effusion. Ligamental testing stable.  Previous injection sites clear.    Neurovascular:  Patient displays symmetric muscle strength.  No obvious gross abnormalities.  5/5 motor strength.  Intact sensory evaluation.  +2/4 dorsalis pedis pulse.  Skin clear.     ASSESSMENT:      ICD-10-CM    1. Primary osteoarthritis of right knee  M17.11            PROCEDURE:   I reviewed with the patient today the risks, benefits, rationale, expected outcomes and potential complications of undergoing Gelsyn-3 injection management.  Consent has been obtained and a signed copy has been placed on the chart.  Patient identified their right knee was to be injected.   The patient was positioned with their knees dangling from a seated position.  The anterior lateral portal of the right knee was prepped using sterile technique.  A 1 unit vial of Gelsyn-3 was injected  into the right knee.  The patient tolerated this without complaints.      PLAN:  Patient was educated on the management of her arthritis moving forward.  she will contact our office based on her symptoms returning.  If she receives a favorable response to the injections for greater than 3 months we could discuss retreating with injections and conservative management.   Should her  symptoms return at the same intensity prior to starting injection treatments with in the next 3 months, she should contact our office for possible surgical planning.  she was once again reminded to take it easy the rest of the day.  Ice is an option for symptoms today.  Questions were encouraged and answered.        TIME OUT: A time out was performed at 11:22  to confirm the correct patient, procedure, and site.  [name] Friend, MA    This note was partially generated using MModal Fluency Direct system, and there may be some incorrect words, spellings, and punctuation that were not noted in checking the note before saving.    I am scribing for, and in the presence of, Eva Fish, MMS, PA-C for services provided on 09/10/2024.  [name] Friend, MA, SCRIBE      I personally  performed  the services described in this documentation, as scribed in my presence, and it is both accurate and complete.  I have reviewed the note and made changes where needed.  I agree with the plan as above.    Eva Fish, PA-C 09/10/2024        [facility] - Orthopaedics & Sports Medicine  [facility]                    [1]   Allergies  Allergen Reactions    Pneumococcal 23-Valent Polysaccharide Vaccine  Other Adverse Reaction (Add comment)     Severe local swelling    Tramadol Nausea/ Vomiting     makes me sick with or without food    Iohexol  Other Adverse Reaction (Add comment) and Hives/ Urticaria      Desc: HIVES SEVERAL HRS S/P IV CONTRAST.. [place] LAST SCAN W/ BENADRYL  PREMED @ [facility] '01/AC.    Lisinopril  Other Adverse Reaction (Add comment)     Other reaction(s): Cough    Other Rash     Other reaction(s): Other (See Comments)  Nickle   When teeth were wired in she place, patient obtained an infection and wiring had to be removed,  Also allergic to silver    Silver Rash    Crestor [Rosuvastatin]      Muscle aches    Lovastatin     Statin [Atorvastatin]      Muscle ache    Iv Contrast Hives/ Urticaria    Nickel Hives/ Urticaria

## 2024-09-23 ENCOUNTER — Encounter (INDEPENDENT_AMBULATORY_CARE_PROVIDER_SITE_OTHER): Payer: Self-pay | Admitting: NURSE PRACTITIONER, FAMILY

## 2024-09-23 ENCOUNTER — Other Ambulatory Visit: Payer: Self-pay

## 2024-09-23 ENCOUNTER — Ambulatory Visit: Payer: Self-pay | Admitting: NURSE PRACTITIONER, FAMILY

## 2024-09-23 VITALS — BP 137/71 | HR 66 | Ht 62.0 in | Wt 159.7 lb

## 2024-09-23 DIAGNOSIS — I2510 Atherosclerotic heart disease of native coronary artery without angina pectoris: Secondary | ICD-10-CM | POA: Insufficient documentation

## 2024-09-23 DIAGNOSIS — Z95820 Peripheral vascular angioplasty status with implants and grafts: Secondary | ICD-10-CM | POA: Insufficient documentation

## 2024-09-23 DIAGNOSIS — R42 Dizziness and giddiness: Secondary | ICD-10-CM | POA: Insufficient documentation

## 2024-09-23 DIAGNOSIS — I10 Essential (primary) hypertension: Secondary | ICD-10-CM | POA: Insufficient documentation

## 2024-09-23 DIAGNOSIS — R0600 Dyspnea, unspecified: Secondary | ICD-10-CM | POA: Insufficient documentation

## 2024-09-23 MED ORDER — METOPROLOL SUCCINATE ER 50 MG TABLET,EXTENDED RELEASE 24 HR
25.0000 mg | ORAL_TABLET | Freq: Every day | ORAL | Status: DC
Start: 2024-09-23 — End: 2024-10-16

## 2024-09-23 NOTE — Patient Instructions (Signed)
 Please cut your metoprolol  in half to 25 mg once daily.

## 2024-09-23 NOTE — Nursing Note (Signed)
 Pt did not have a med list with them at visit - went over list with patient   Advised patient to check AVS list with medicines at home and call if any discrepancies    -Confirmed correct pharmacy with patient for e-scribing   Jodeci Roarty, RN 09/23/2024 11:01 AM

## 2024-09-23 NOTE — Progress Notes (Signed)
 [name] [name] [facility]  [address]  [place] [address]  Phone: [phone]  Fax: [phone]    Encounter Date: 09/23/2024  Patient ID:  [name] [name]       MRN:  [id number]  DOB: 04-Mar-1941                    PCP: Alan Muff, PA-C    Chief Complaint   Patient presents with    Follow Up 6 Months    Chest Pain     Under left breast      SUBJECTIVE:   [name] [name] is a 83 y.o. female.    She has a PMH of CAD s/p PCI to the LCx x 2 and more recently IVUS guided PCI on 01/19/2022 to the mid LCx x1 that was overlapped proximally with previously placed stent for unstable angina, HTN, HLD and DMT2. A TTE showed preserved EF at 68.5% and no significant valve disease. She used to follow with Dr. Rexine for lipid management and remains on Repatha  140 mg every two weeks and Zetia  10 mg once daily. In April of 2023, she had syncope while at church. 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 She's following with vascular now and saw Dr. Tommye in 07/2022. 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 A 14 day Holter monitor in 05/2022 showed sinus rhythm with no Afib, VT, heart block or pauses. Average heart rate was 65bpm. Her last transmission from her loop recorder showed bradycardic readings, however her monitor is undersensing, and likely those are inaccurate readings.      She had a cardiac catheterization completed in February 2025, which showed widely patent stents with mild diffuse disease elsewhere.  Today, she notes that she has had 2 episodes, where she felt short of breath, lightheaded, and on 1 occasion she vomited.  She recently had her Jardiance   increase, and is unsure how her blood sugars have been as she has not checked.  She believes she may be having some drops in her blood sugar.  She has sharp pain under her left breast area, that only lasts for a second at a time.    ROS   Cardiovascular: positive for chest pressure/discomfort and fatigue  Neurological: positive for dizziness  All other ROS Negative    Past Medical History:   Diagnosis Date    Cancer (CMS [facility])     skin cancer -- forehead  2024    Coronary artery disease     CVA (cerebral vascular accident) 2009    Dyspnea on exertion     Essential hypertension     Hyperlipidemia     PONV (postoperative nausea and vomiting)     Skin cancer     Stented coronary artery     Type 2 diabetes mellitus      Past Surgical History:   Procedure Laterality Date    CARDIAC CATHETERIZATION      CATARACT EXTRACTION, BILATERAL Bilateral     HX COLONOSCOPY      HX HEART CATHETERIZATION  2017 and 2018    Stented twice  prox circ  and circ  HX PARTIAL HYSTERECTOMY      tubes and ovaries left intact    HX THYROIDECTOMY  1978    HX WISDOM TEETH EXTRACTION      KNEE SURGERY  2018    muscle removed    LAPAROSCOPIC CHOLECYSTECTOMY      RECONSTRUCTION OF NOSE  1980    SKIN CANCER EXCISION      left side forehead       Current Outpatient Medications   Medication Sig    ALPRAZolam  (XANAX ) 0.5 mg Oral Tablet Take 1 Tablet (0.5 mg total) by mouth Every evening    amLODIPine  (NORVASC ) 5 mg Oral Tablet Take 1 Tablet (5 mg total) by mouth Once a day    bempedoic acid  180 mg Oral Tablet Take 1 Tablet (180 mg total) by mouth Once a day    Cholecalciferol, Vitamin D3, 10 mcg (400 unit) Oral Tablet, Chewable Chew Once a day    clopidogreL  (PLAVIX ) 75 mg Oral Tablet Take 1 Tablet (75 mg total) by mouth Daily    evolocumab  (REPATHA  SURECLICK) 140 mg/mL Subcutaneous Pen Injector Inject 1 pen (140 mg) subcutaneously (under the skin) once every 14 days    ezetimibe  (ZETIA ) 10 mg Oral Tablet TAKE 1 TABLET BY MOUTH EVERY DAY IN THE EVENING     fluticasone  propionate (FLONASE  NASL) by Nasal route    Irbesartan-Hydrochlorothiazide  300-12.5 mg Oral Tablet Take 300 mg by mouth Once a day    Isosorbide  Mononitrate (IMDUR ) 120 mg Oral Tablet Sustained Release 24 hr TAKE 1 TABLET BY MOUTH EVERY MORNING.    JARDIANCE  25 mg Oral Tablet Take 1 Tablet (25 mg total) by mouth Every morning    levothyroxine  (SYNTHROID ) 112 mcg Oral Tablet Take 1 Tablet (112 mcg total) by mouth Every morning    metoprolol  succinate (TOPROL -XL) 50 mg Oral Tablet Sustained Release 24 hr Take 0.5 Tablets (25 mg total) by mouth Daily    nitroGLYCERIN  (NITROSTAT ) 0.4 mg Sublingual Tablet, Sublingual PLACE 1 TABLET UNDER TONGUE EVERY 5 MINS, UP TO 3 DOSES AS NEEDED FOR CHEST PAIN    pantoprazole  (PROTONIX ) 40 mg Oral Tablet, Delayed Release (E.C.) Take 1 Tablet (40 mg total) by mouth Daily    POTASSIUM-99 ORAL Take by mouth Once a day    ranolazine  (RANEXA ) 500 mg Oral Tablet Sustained Release 12 hr TAKE 1 TABLET BY MOUTH TWICE DAILY.    valACYclovir (VALTREX) 500 mg Oral Tablet Take 1 Tablet (500 mg total) by mouth Twice daily    vitamin B complex (B COMPLEX-VITAMIN B12 ORAL) Take by mouth Once a day      Allergies  Allergies[1]     Social History     Tobacco Use    Smoking status: Former     Current packs/day: 0.50     Average packs/day: 0.5 packs/day for 10.0 years (5.0 ttl pk-yrs)     Types: Cigarettes     Passive exposure: Past    Smokeless tobacco: Never   Substance Use Topics    Alcohol use: Not Currently     Alcohol/week: 1.0 standard drink of alcohol     Types: 1 Cans of beer per week     Comment: drank long time ago not current.     Family History   Problem Relation Age of Onset    Uterine Cancer Mother         76's or 76's    Heart Attack Mother     Cardiomyopathy Father     Heart Disease  Sister     No Known Problems Brother     No Known Problems Maternal Grandmother     No Known Problems Maternal Grandfather     No Known Problems Paternal Grandmother     No Known Problems  Paternal Grandfather     No Known Problems Daughter     No Known Problems Son     No Known Problems Maternal Aunt     No Known Problems Maternal Uncle     No Known Problems Paternal Aunt     No Known Problems Paternal Uncle     Uterine Cancer Niece         20's    No Known Problems Other      OBJECTIVE: Blood pressure 137/71, pulse 66, height 1.575 m (5' 2), weight 72.4 kg (159 lb 11.6 oz), SpO2 98%. Body mass index is 29.21 kg/m.    Physical Exam  Constitutional:       Appearance: Normal appearance.   Cardiovascular:      Rate and Rhythm: Normal rate and regular rhythm.      Heart sounds: Normal heart sounds.   Pulmonary:      Breath sounds: Normal breath sounds.   Musculoskeletal:         General: Normal range of motion.   Skin:     General: Skin is warm and dry.   Neurological:      Mental Status: She is alert and oriented to person, place, and time.           Labs/studies reviewed.     ASSESSMENT/PLAN:   Medical decision making/treatment plan based upon reviewed diagnoses, discussion, and pertinent patient instructions/directions/orders.  Assessment/Plan   1. CAD in native artery    2. S/P angioplasty with stent    3. Dyspnea    4. Essential hypertension    5. Dizziness      Ms. Podolski reports that she has had 2 episodes since her last visit of shortness of breath, with subsequent lightheadedness, and vomiting.  She recently had her Jardiance  increased, so I am concerned that her blood sugars could be dropping during these episodes.  Her loop recorder is under sensing, so will plan to have her come back on a Friday to see if her loop recorder needs adjusted.  She has reported episodes of pauses and bradycardia on her loop recorder, but likely they are inaccurate given her undersensing.  She will return to the office in 2 weeks for a loop interrogation and possible adjustment.  Will plan to cut her metoprolol  in half to 25 mg once daily.  She will call with any questions or concerns prior to her next office  visit.    Orders Placed This Encounter    ECG in clinic, same day (MUSE )    metoprolol  succinate (TOPROL -XL) 50 mg Oral Tablet Sustained Release 24 hr      Return in about 2 weeks (around 10/07/2024), or if symptoms worsen or fail to improve.    This patient was seen independently with supervising physician available for consultation.    On the day of the encounter, a total of 30 minutes was spent on this patient encounter including direct/indirect care of this patient including initial evaluation, review of laboratory, radiology, diagnostic studies, review of medical record, order entry and coordination of care.      A portion of this documentation may have been generated using MMODAL voice recognition software and may contain syntax/voice recognition errors.  Richerd Clement, NP    [facility]         [1]   Allergies  Allergen Reactions    Pneumococcal 23-Valent Polysaccharide Vaccine  Other Adverse Reaction (Add comment)     Severe local swelling    Tramadol Nausea/ Vomiting     makes me sick with or without food    Iohexol  Other Adverse Reaction (Add comment) and Hives/ Urticaria      Desc: HIVES SEVERAL HRS S/P IV CONTRAST.. [place] LAST SCAN W/ BENADRYL  PREMED @ [facility] '01/AC.    Lisinopril  Other Adverse Reaction (Add comment)     Other reaction(s): Cough    Other Rash     Other reaction(s): Other (See Comments)  Nickle   When teeth were wired in she place, patient obtained an infection and wiring had to be removed,  Also allergic to silver    Silver Rash    Crestor [Rosuvastatin]      Muscle aches    Lovastatin     Statin [Atorvastatin]      Muscle ache    Iv Contrast Hives/ Urticaria    Nickel Hives/ Urticaria

## 2024-09-25 ENCOUNTER — Encounter (INDEPENDENT_AMBULATORY_CARE_PROVIDER_SITE_OTHER): Payer: Self-pay | Admitting: NURSE PRACTITIONER, FAMILY

## 2024-09-30 ENCOUNTER — Other Ambulatory Visit (INDEPENDENT_AMBULATORY_CARE_PROVIDER_SITE_OTHER): Payer: Self-pay | Admitting: Internal Medicine

## 2024-10-06 ENCOUNTER — Other Ambulatory Visit (HOSPITAL_COMMUNITY): Payer: Self-pay

## 2024-10-06 DIAGNOSIS — I824Z1 Acute embolism and thrombosis of unspecified deep veins of right distal lower extremity: Secondary | ICD-10-CM

## 2024-10-07 ENCOUNTER — Ambulatory Visit
Admission: RE | Admit: 2024-10-07 | Discharge: 2024-10-07 | Disposition: A | Discharge status: Home / Self Care | Source: Ambulatory Visit

## 2024-10-07 ENCOUNTER — Other Ambulatory Visit: Payer: Self-pay

## 2024-10-07 DIAGNOSIS — M7121 Synovial cyst of popliteal space [Baker], right knee: Secondary | ICD-10-CM | POA: Insufficient documentation

## 2024-10-07 DIAGNOSIS — I824Z1 Acute embolism and thrombosis of unspecified deep veins of right distal lower extremity: Secondary | ICD-10-CM | POA: Insufficient documentation

## 2024-10-08 DIAGNOSIS — R9431 Abnormal electrocardiogram [ECG] [EKG]: Secondary | ICD-10-CM

## 2024-10-08 LAB — ECG 12 LEAD W/ INTERP (AMB USE ONLY) (MUSE, IN CLINC) (93005/93010)
Atrial Rate: 64 {beats}/min
Calculated P Axis: 60 degrees
Calculated R Axis: -7 degrees
Calculated T Axis: 27 degrees
PR Interval: 144 ms
QRS Duration: 86 ms
QT Interval: 418 ms
QTC Calculation: 431 ms
Ventricular rate: 64 {beats}/min

## 2024-10-09 ENCOUNTER — Encounter: Attending: Internal Medicine | Admitting: Internal Medicine

## 2024-10-09 DIAGNOSIS — R55 Syncope and collapse: Secondary | ICD-10-CM | POA: Insufficient documentation

## 2024-10-09 DIAGNOSIS — Z95818 Presence of other cardiac implants and grafts: Secondary | ICD-10-CM | POA: Insufficient documentation

## 2024-10-13 ENCOUNTER — Telehealth (HOSPITAL_BASED_OUTPATIENT_CLINIC_OR_DEPARTMENT_OTHER): Payer: Self-pay | Admitting: Medical

## 2024-10-13 ENCOUNTER — Ambulatory Visit (HOSPITAL_BASED_OUTPATIENT_CLINIC_OR_DEPARTMENT_OTHER): Payer: Self-pay | Admitting: Medical

## 2024-10-13 NOTE — Telephone Encounter (Addendum)
 Called Patient due to message patient agreed to the time an date of her appointment     . Roselyn Bees, MA      ----- Message from Avelina ORN sent at 10/13/2024  8:59 AM EST -----  Regarding: Eva Fish  Contact: [phone]  Pt called to reschedule her 11/11 at 1300 appt.due to snow    Call  [phone]    Thanks

## 2024-10-16 ENCOUNTER — Other Ambulatory Visit: Payer: Self-pay

## 2024-10-16 ENCOUNTER — Encounter (INDEPENDENT_AMBULATORY_CARE_PROVIDER_SITE_OTHER): Payer: Self-pay | Admitting: NURSE PRACTITIONER, FAMILY

## 2024-10-16 ENCOUNTER — Ambulatory Visit: Payer: Self-pay | Attending: NURSE PRACTITIONER, FAMILY | Admitting: NURSE PRACTITIONER, FAMILY

## 2024-10-16 VITALS — BP 124/64 | HR 64 | Ht 62.0 in | Wt 154.3 lb

## 2024-10-16 DIAGNOSIS — R0600 Dyspnea, unspecified: Secondary | ICD-10-CM | POA: Insufficient documentation

## 2024-10-16 DIAGNOSIS — Z87891 Personal history of nicotine dependence: Secondary | ICD-10-CM

## 2024-10-16 DIAGNOSIS — Z95820 Peripheral vascular angioplasty status with implants and grafts: Secondary | ICD-10-CM | POA: Insufficient documentation

## 2024-10-16 DIAGNOSIS — R5383 Other fatigue: Secondary | ICD-10-CM | POA: Insufficient documentation

## 2024-10-16 DIAGNOSIS — I10 Essential (primary) hypertension: Secondary | ICD-10-CM | POA: Insufficient documentation

## 2024-10-16 DIAGNOSIS — Z8249 Family history of ischemic heart disease and other diseases of the circulatory system: Secondary | ICD-10-CM

## 2024-10-16 DIAGNOSIS — I2510 Atherosclerotic heart disease of native coronary artery without angina pectoris: Secondary | ICD-10-CM | POA: Insufficient documentation

## 2024-10-16 MED ORDER — NEBIVOLOL 2.5 MG TABLET: 2.5000 mg | ORAL_TABLET | Freq: Every day | ORAL | 3 refills | Status: DC

## 2024-10-16 NOTE — Progress Notes (Signed)
 [facility]  [address]  [place] [address]  Phone: [phone]  Fax: [phone]    Encounter Date: 10/16/2024  Patient ID:  [name] [name]       MRN:  [id number]  DOB: October 09, 1941                    PCP: Alan Muff, PA-C    Chief Complaint   Patient presents with    Follow Up    Pacemaker Check    Shortness of Breath    Dizziness    Fatigue    Chest Pain    Leg Swelling      SUBJECTIVE:   [name] [name] is a 83 y.o. female. Lipid-lowering Rx history for ASCVD  Lipitor Mevacor Crestor myalgias  Zetia  myalgias  Repatha  ordered              [facility] 12/02/21)  External labs@PCP   [facility]  Zetia  restarted on 12/01/2021      [name] [name] has a PMH of CAD s/p PCI to the LCx x 2 and more recently IVUS guided PCI on 01/19/2022 to the mid LCx x1 that was overlapped proximally with previously placed stent for unstable angina, HTN, HLD and DMT2. A TTE showed preserved EF at 68.5% and no significant valve disease. [name] [name] used to follow with Dr. Rexine for lipid management and remains on Repatha  140 mg every two weeks and Zetia  10 mg once daily. In April of 2023, [name] [name] had syncope while at church. 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 [name] [name]'s following with vascular now and saw Dr. Tommye in 07/2022. 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 A 14 day Holter monitor in 05/2022 showed sinus rhythm with no Afib, VT, heart block or pauses. Average heart rate was 65bpm. Her last transmission from her loop recorder showed bradycardic readings, however her monitor is undersensing, and likely those are inaccurate readings.     [name] [name] had a cardiac  catheterization completed in February 2025, which showed widely patent stents with mild diffuse disease elsewhere.  At her last office visit, [name] [name] reported lightheadedness and dizziness.  Her metoprolol  was cut in half to 25 mg once daily, and [name] [name] returns to the office today to have her loop interrogated.    Today, [name] [name] notes that her symptoms have remained unchanged.  [name] [name] continues to have significant fatigue, and states that [name] [name] has not felt the reduction of metoprolol  has helped with her symptoms.  [name] [name] has chest pain a couple times a week, which is atypical in nature.  [name] [name] was started on a medication for depression approximately 3 weeks ago.  [name] [name] denies any other symptoms.  ROS   Constitutional: positive for fatigue  Cardiovascular: positive for chest pressure/discomfort, dyspnea, and fatigue  All other ROS Negative    Past Medical History:   Diagnosis Date    Cancer (CMS [facility])     skin cancer -- forehead  2024    Coronary artery disease     CVA (cerebral vascular accident) 2009    Dyspnea on exertion     Essential hypertension     Hyperlipidemia  PONV (postoperative nausea and vomiting)     Skin cancer     Stented coronary artery     Type 2 diabetes mellitus      Past Surgical History:   Procedure Laterality Date    CARDIAC CATHETERIZATION      CATARACT EXTRACTION, BILATERAL Bilateral     HX COLONOSCOPY      HX HEART CATHETERIZATION  2017 and 2018    Stented twice  prox circ  and circ    HX PARTIAL HYSTERECTOMY      tubes and ovaries left intact    HX THYROIDECTOMY  1978    HX WISDOM TEETH EXTRACTION      KNEE SURGERY  2018    muscle removed    LAPAROSCOPIC CHOLECYSTECTOMY      RECONSTRUCTION OF NOSE  1980    SKIN CANCER EXCISION      left side forehead       Current Outpatient Medications   Medication Sig    ALPRAZolam  (XANAX ) 0.5 mg Oral Tablet Take 1 Tablet (0.5 mg total) by mouth Every evening    amLODIPine  (NORVASC ) 5 mg Oral Tablet Take 1 Tablet (5 mg total) by mouth Once a day    bempedoic acid  180 mg  Oral Tablet Take 1 Tablet (180 mg total) by mouth Once a day (Patient not taking: Reported on 10/16/2024)    Cholecalciferol, Vitamin D3, 10 mcg (400 unit) Oral Tablet, Chewable Chew Once a day    clopidogreL  (PLAVIX ) 75 mg Oral Tablet Take 1 Tablet (75 mg total) by mouth Daily    DULoxetine (CYMBALTA DR) 60 mg Oral Capsule, Delayed Release(E.C.) Take 1 Capsule (60 mg total) by mouth Daily    evolocumab  (REPATHA  SURECLICK) 140 mg/mL Subcutaneous Pen Injector Inject 1 pen (140 mg) subcutaneously (under the skin) once every 14 days    ezetimibe  (ZETIA ) 10 mg Oral Tablet TAKE 1 TABLET BY MOUTH EVERY DAY IN THE EVENING    fluticasone  propionate (FLONASE  NASL) by Nasal route    Irbesartan-Hydrochlorothiazide  300-12.5 mg Oral Tablet Take 300 mg by mouth Once a day    Isosorbide  Mononitrate (IMDUR ) 120 mg Oral Tablet Sustained Release 24 hr TAKE 1 TABLET BY MOUTH EVERY MORNING.    JARDIANCE  25 mg Oral Tablet Take 1 Tablet (25 mg total) by mouth Every morning    levothyroxine  (SYNTHROID ) 112 mcg Oral Tablet Take 1 Tablet (112 mcg total) by mouth Every morning    nebivoloL (BYSTOLIC) 2.5 mg Oral Tablet Take 1 Tablet (2.5 mg total) by mouth Daily    nitroGLYCERIN  (NITROSTAT ) 0.4 mg Sublingual Tablet, Sublingual PLACE 1 TABLET UNDER TONGUE EVERY 5 MINS, UP TO 3 DOSES AS NEEDED FOR CHEST PAIN    pantoprazole  (PROTONIX ) 40 mg Oral Tablet, Delayed Release (E.C.) Take 1 Tablet (40 mg total) by mouth Daily    POTASSIUM-99 ORAL Take by mouth Once a day    ranolazine  (RANEXA ) 500 mg Oral Tablet Sustained Release 12 hr TAKE 1 TABLET BY MOUTH TWICE DAILY.    valACYclovir (VALTREX) 500 mg Oral Tablet Take 1 Tablet (500 mg total) by mouth Twice daily    vitamin B complex (B COMPLEX-VITAMIN B12 ORAL) Take by mouth Once a day      Allergies  Allergies[1]     Social History     Tobacco Use    Smoking status: Former     Current packs/day: 0.50     Average packs/day: 0.5 packs/day for 10.0 years (5.0 ttl pk-yrs)  Types: Cigarettes      Passive exposure: Past    Smokeless tobacco: Never   Substance Use Topics    Alcohol use: Not Currently     Alcohol/week: 1.0 standard drink of alcohol     Types: 1 Cans of beer per week     Comment: drank long time ago not current.     Family History   Problem Relation Age of Onset    Uterine Cancer Mother         59's or 28's    Heart Attack Mother     Cardiomyopathy Father     Heart Disease Sister     No Known Problems Brother     No Known Problems Maternal Grandmother     No Known Problems Maternal Grandfather     No Known Problems Paternal Grandmother     No Known Problems Paternal Grandfather     No Known Problems Daughter     No Known Problems Son     No Known Problems Maternal Aunt     No Known Problems Maternal Uncle     No Known Problems Paternal Aunt     No Known Problems Paternal Uncle     Uterine Cancer Niece         20's    No Known Problems Other      OBJECTIVE: Blood pressure 124/64, pulse 64, height 1.575 m (5' 2), weight 70 kg (154 lb 5.2 oz), SpO2 98%. Body mass index is 28.23 kg/m.    Physical Exam  Constitutional:       Appearance: Normal appearance.   Cardiovascular:      Rate and Rhythm: Normal rate and regular rhythm.      Heart sounds: Normal heart sounds.   Pulmonary:      Breath sounds: Normal breath sounds.   Musculoskeletal:         General: Normal range of motion.   Skin:     General: Skin is warm and dry.   Neurological:      Mental Status: [name] [name] is alert and oriented to person, place, and time.           Labs/studies reviewed.     ASSESSMENT/PLAN:   Medical decision making/treatment plan based upon reviewed diagnoses, discussion, and pertinent patient instructions/directions/orders.  Assessment/Plan   1. CAD in native artery    2. S/P angioplasty with stent    3. Fatigue, unspecified type    4. Dyspnea    5. Essential hypertension      [name] [name] reports that [name] [name] continues to feel a significant amount of fatigue.  [name] [name] states that reducing her dose of metoprolol  has not helped with her  symptoms.  We will try to discontinue her metoprolol  with plans to initiate nebivolol 2.5 mg once daily.  [name] [name] will update the office in the week with blood pressure and heart rate readings, and let me know how [name] [name] is feeling.  Her loop was interrogated in the office today, which confirmed under sensing.  [name] [name] had no true significant bradycardic episodes.  Her sensitivity was already sent to the maximum reading, so no further adjustments could be made.  [name] [name] will return to the office for a follow-up visit in 4 weeks, sooner if any new issues should arise.  [name] [name] will call with any questions or concerns prior to that office visit.    Orders Placed This Encounter    nebivoloL (BYSTOLIC) 2.5 mg Oral Tablet      Return  in about 4 weeks (around 11/13/2024), or if symptoms worsen or fail to improve.    This patient was seen independently with supervising physician available for consultation.    On the day of the encounter, a total of 30 minutes was spent on this patient encounter including direct/indirect care of this patient including initial evaluation, review of laboratory, radiology, diagnostic studies, review of medical record, order entry and coordination of care.      A portion of this documentation may have been generated using MMODAL voice recognition software and may contain syntax/voice recognition errors.     Richerd Clement, NP    [facility]         [1]   Allergies  Allergen Reactions    Pneumococcal 23-Valent Polysaccharide Vaccine  Other Adverse Reaction (Add comment)     Severe local swelling    Tramadol Nausea/ Vomiting     makes me sick with or without food    Iohexol  Other Adverse Reaction (Add comment) and Hives/ Urticaria      Desc: HIVES SEVERAL HRS S/P IV CONTRAST.. OK LAST SCAN W/ BENADRYL  PREMED @ [facility] '01/AC.    Lisinopril  Other Adverse Reaction (Add comment)     Other reaction(s): Cough    Other Rash     Other reaction(s): Other (See Comments)  Nickle   When teeth were  wired in [name] [name] place, patient obtained an infection and wiring had to be removed,  Also allergic to silver    Silver Rash    Crestor [Rosuvastatin]      Muscle aches    Lovastatin     Statin [Atorvastatin]      Muscle ache    Iv Contrast Hives/ Urticaria    Nickel Hives/ Urticaria

## 2024-10-19 ENCOUNTER — Encounter (INDEPENDENT_AMBULATORY_CARE_PROVIDER_SITE_OTHER): Payer: Self-pay | Admitting: NURSE PRACTITIONER, FAMILY

## 2024-10-23 ENCOUNTER — Ambulatory Visit: Attending: Medical | Admitting: Medical

## 2024-10-23 ENCOUNTER — Encounter (HOSPITAL_BASED_OUTPATIENT_CLINIC_OR_DEPARTMENT_OTHER): Payer: Self-pay | Admitting: Medical

## 2024-10-23 ENCOUNTER — Other Ambulatory Visit: Payer: Self-pay

## 2024-10-23 VITALS — Ht 62.0 in | Wt 156.0 lb

## 2024-10-23 DIAGNOSIS — M1711 Unilateral primary osteoarthritis, right knee: Secondary | ICD-10-CM | POA: Insufficient documentation

## 2024-10-23 NOTE — Progress Notes (Signed)
 Orthopaedics & Sports Medicine  [address]  [place], [place] [address]  [phone]       OFFICE VISIT    PATIENT NAME:  [name] [name]  MEDICAL RECORD NUMBER: [id number]    DICTATING PHYSICIAN:  Eva Fish, PA-C  REFERRING PHYSICIAN:  Self, Referral    DOB:  09-07-1941  DOS:  10/23/2024    CHIEF COMPLAINT:  Right Knee Pain       HISTORY OF PRESENT ILLNESS:  [name] [name] is a 83 y.o. female.  Comes to the office today for follow up consultation in regards to ongoing right knee pain.  Patient states that this has been ongoing for many years.  she cannot recall one specific injury that caused the pain.  Patient tells me that their symptoms are worse with weight bearing activities and ambulating on uneven ground and downward grades.  she has pain that awakens them from sleeping. In addition to modifying the activities that create pain, she has attempted NSAIDs, tylenol , steroid injections, Visco injections, PRP injections, Zilretta  in the right knee. She last had a Zilretta  injection on 05/19/24 and states that it did not help. She last had Gelsyn-3 on 09/10/24,    She has multiple health issues including cardiac problems, h/o stroke.  At this point the patient is here today to discuss further evaluation of their knee pain.   She is currently scheduled to undergo a right TKA on 03/02/25. She mentions that she started to notice increased pain behind the knee and was evaluated. They did perform an ultrasound to evaluate for possible DVT which was negative but they did find a baker's cyst.     PAST MEDICAL HISTORY:  Past Medical History:   Diagnosis Date    Cancer (CMS [facility])     skin cancer -- forehead  2024    Coronary artery disease     CVA (cerebral vascular accident) 2009    Dyspnea on exertion     Essential hypertension     Hyperlipidemia     PONV (postoperative nausea and vomiting)     Skin cancer     Stented coronary artery     Type 2 diabetes mellitus            PAST SURGICAL HISTORY:  Past Surgical History:    Procedure Laterality Date    CARDIAC CATHETERIZATION      CATARACT EXTRACTION, BILATERAL Bilateral     HX COLONOSCOPY      HX HEART CATHETERIZATION  2017 and 2018    Stented twice  prox circ  and circ    HX PARTIAL HYSTERECTOMY      tubes and ovaries left intact    HX THYROIDECTOMY  1978    HX WISDOM TEETH EXTRACTION      KNEE SURGERY  2018    muscle removed    LAPAROSCOPIC CHOLECYSTECTOMY      RECONSTRUCTION OF NOSE  1980    SKIN CANCER EXCISION      left side forehead           MEDICATIONS:  Current Outpatient Medications   Medication Sig    ALPRAZolam  (XANAX ) 0.5 mg Oral Tablet Take 1 Tablet (0.5 mg total) by mouth Every evening    amLODIPine  (NORVASC ) 5 mg Oral Tablet Take 1 Tablet (5 mg total) by mouth Once a day    bempedoic acid  180 mg Oral Tablet Take 1 Tablet (180 mg total) by mouth Once a day (Patient not taking: Reported on 10/16/2024)  Cholecalciferol, Vitamin D3, 10 mcg (400 unit) Oral Tablet, Chewable Chew Once a day    clopidogreL  (PLAVIX ) 75 mg Oral Tablet Take 1 Tablet (75 mg total) by mouth Daily    DULoxetine (CYMBALTA DR) 60 mg Oral Capsule, Delayed Release(E.C.) Take 1 Capsule (60 mg total) by mouth Daily    evolocumab  (REPATHA  SURECLICK) 140 mg/mL Subcutaneous Pen Injector Inject 1 pen (140 mg) subcutaneously (under the skin) once every 14 days    ezetimibe  (ZETIA ) 10 mg Oral Tablet TAKE 1 TABLET BY MOUTH EVERY DAY IN THE EVENING    fluticasone  propionate (FLONASE  NASL) by Nasal route    Irbesartan-Hydrochlorothiazide  300-12.5 mg Oral Tablet Take 300 mg by mouth Once a day    Isosorbide  Mononitrate (IMDUR ) 120 mg Oral Tablet Sustained Release 24 hr TAKE 1 TABLET BY MOUTH EVERY MORNING.    JARDIANCE  25 mg Oral Tablet Take 1 Tablet (25 mg total) by mouth Every morning    levothyroxine  (SYNTHROID ) 112 mcg Oral Tablet Take 1 Tablet (112 mcg total) by mouth Every morning    nebivoloL  (BYSTOLIC ) 2.5 mg Oral Tablet Take 1 Tablet (2.5 mg total) by mouth Daily    nitroGLYCERIN  (NITROSTAT ) 0.4 mg  Sublingual Tablet, Sublingual PLACE 1 TABLET UNDER TONGUE EVERY 5 MINS, UP TO 3 DOSES AS NEEDED FOR CHEST PAIN    pantoprazole  (PROTONIX ) 40 mg Oral Tablet, Delayed Release (E.C.) Take 1 Tablet (40 mg total) by mouth Daily    POTASSIUM-99 ORAL Take by mouth Once a day    ranolazine  (RANEXA ) 500 mg Oral Tablet Sustained Release 12 hr TAKE 1 TABLET BY MOUTH TWICE DAILY.    valACYclovir (VALTREX) 500 mg Oral Tablet Take 1 Tablet (500 mg total) by mouth Twice daily    vitamin B complex (B COMPLEX-VITAMIN B12 ORAL) Take by mouth Once a day       ALLERGIES:  Allergy History as of 10/23/24       IV CONTRAST         Noted Status Severity Type Reaction    08/28/19 1339 [place], Margarie, LPN 90/74/79 Active Low  Hives/ Urticaria              NICKEL         Noted Status Severity Type Reaction    08/28/19 1339 Marion, Margarie, LPN 90/74/79 Active Low  Hives/ Urticaria              ATORVASTATIN         Noted Status Severity Type Reaction    05/24/21 1302 Calain, Amy, RN 05/24/21 Active       Comments: Muscle ache               ROSUVASTATIN         Noted Status Severity Type Reaction    05/24/21 1303 Calain, Amy, RN 05/24/21 Active       Comments: Muscle aches               LOVASTATIN         Noted Status Severity Type Reaction    05/24/21 1303 Calain, Amy, RN 05/24/21 Active                 IOHEXOL         Noted Status Severity Type Reaction    01/18/22 0401 Ritta Florence, RN 08/15/05 Active Medium Side Effect  Other Adverse Reaction (Add comment), Hives/ Urticaria    Comments:  Desc: HIVES SEVERAL HRS S/P IV CONTRAST.. [place] LAST SCAN W/  BENADRYL  PREMED @ [facility] '01/AC.     07/26/21 1424 Elenore Fairy SQUIBB, LPN 90/86/93 Active Medium  Hives/ Urticaria,  Other Adverse Reaction (Add comment)    Comments:  Desc: HIVES SEVERAL HRS S/P IV CONTRAST.. [place] LAST SCAN W/ BENADRYL  PREMED @ [facility] '01/AC.               LISINOPRIL         Noted Status Severity Type Reaction    01/18/22 0402 Ritta Florence, RN 05/25/14 Active Medium Side Effect  Other  Adverse Reaction (Add comment)    Comments: Other reaction(s): Cough     07/26/21 1424 Elenore Fairy SQUIBB, LPN 93/76/84 Active Medium      Comments: Other reaction(s): Cough               PNEUMOCOCCAL 23-VALENT POLYSACCHARIDE VACCINE         Noted Status Severity Type Reaction    01/18/22 0401 Ritta Florence, RN 05/25/14 Active High Systemic  Other Adverse Reaction (Add comment)    Comments: Severe local swelling     07/26/21 1424 Elenore Fairy SQUIBB, LPN 93/76/84 Active High   Other Adverse Reaction (Add comment)    Comments: Severe local swelling               TRAMADOL         Noted Status Severity Type Reaction    01/18/22 0401 Ritta Florence, RN 10/21/18 Active High Side Effect Nausea/ Vomiting    Comments: makes me sick with or without food     07/26/21 1424 Elenore Fairy SQUIBB, LPN 88/80/80 Active High  Nausea/ Vomiting    Comments: makes me sick with or without food               OTHER         Noted Status Severity Type Reaction    01/18/22 0401 Ritta Florence, RN 01/12/16 Active Medium Side Effect Rash    Comments: Other reaction(s): Other (See Comments)  Nickle   When teeth were wired in she place, patient obtained an infection and wiring had to be removed,  Also allergic to silver     07/26/21 1425 Elenore Fairy SQUIBB, LPN 97/90/82 Active Medium  Rash    Comments: Other reaction(s): Other (See Comments)  Nickle   When teeth were wired in she place, patient obtained an infection and wiring had to be removed,  Also allergic to silver               SILVER         Noted Status Severity Type Reaction    05/19/24 1108 Hilma Buchanan, [place] 05/19/24 Active Medium  Rash                    FAMILY HISTORY:  Family Medical History:       Problem Relation (Age of Onset)    Cardiomyopathy Father    Heart Attack Mother    Heart Disease Sister    No Known Problems Brother, Maternal Grandmother, Maternal Grandfather, Paternal Grandmother, Paternal Grandfather, Daughter, Son, Maternal Aunt, Maternal Uncle, Paternal Aunt, Paternal  Uncle, Other    Uterine Cancer Mother, Niece                SOCIAL HISTORY:  Social History     Socioeconomic History    Marital status: Married     Spouse name: Not on file    Number of children: Not on file    Years of education:  Not on file    Highest education level: Not on file   Occupational History    Not on file   Tobacco Use    Smoking status: Former     Current packs/day: 0.50     Average packs/day: 0.5 packs/day for 10.0 years (5.0 ttl pk-yrs)     Types: Cigarettes     Passive exposure: Past    Smokeless tobacco: Never   Vaping Use    Vaping status: Never Used   Substance and Sexual Activity    Alcohol use: Not Currently     Alcohol/week: 1.0 standard drink of alcohol     Types: 1 Cans of beer per week     Comment: drank long time ago not current.    Drug use: Never    Sexual activity: Not Currently   Other Topics Concern    Ability to Walk 1 Flight of Steps without SOB/CP No    Routine Exercise Not Asked    Ability to Walk 2 Flight of Steps without SOB/CP Not Asked    Unable to Ambulate Not Asked    Total Care Not Asked    Ability To Do Own ADL's Yes    Uses Walker Not Asked    Other Activity Level Not Asked    Uses Cane Not Asked   Social History Narrative    Not on file     Social Determinants of Health     Financial Resource Strain: Not on file   Transportation Needs: Not on file   Social Connections: Not on file   Intimate Partner Violence: Not on file   Housing Stability: Not on file       REVIEW OF SYSTEMS  Constitutional: negative  Musculoskeletal:positive for Right knee pain  All other ROS Negative    PHYSICAL EXAMINATION:    Ht 1.575 m (5' 2)   Wt 70.8 kg (156 lb)   BMI 28.53 kg/m         GENERAL: she is alert, cooperative, no distress, appears stated age.      ORTHOPEDIC EVALUATION:    Patient displays an abnormal gait.  Patient favors the rightlower extremity.  Gross inspection reveals normal mechanical and anatomic axis of the lower extremities with no appreciable leg length discrepancy.   No tenderness found with AP or lateral compression testing of the pelvis.    Right hip shows full PROM (Forward flexion 0-115 degrees, internal rotation 0-35 degrees, external rotation 0-45 degrees). McCarthy's Maneuvers are negative for impingement. FABRE testing negative. No tenderness to palpation over the greater trochanter.  Straight leg testing is negative for radiculopathy.  Compartments are supple.  Skin is clear.  +5/5 hip flexors/extenders, as well as, knee flexors and extenders.  Intact sensory exam along all dermatomes.    Left hip shows full PROM (Forward flexion 0-115 degrees, internal rotation 0-35 degrees, external rotation 0-45 degrees). McCarthy's Maneuvers are negative for impingement. FABRE testing negative. No tenderness to palpation over the greater trochanter.  Straight leg testing is negative for radiculopathy.  Compartments are supple.  Skin is clear.  +5/5 hip flexors/extenders, as well as, knee flexors and extenders.  Intact sensory exam along all dermatomes.    Right knee reveals a decrease in ROM from 0 to 105 degrees. Positive Clark's patella-femoral compression test for crepitus and pain.  Trace effusion is present.  Lachman's test is negative.  Posterior Drawer test is negative.  Collateral ligamental testing demonstrates pseudo-laxity over the MCL.  McMurrary testing is negative.  Palpable Baker's cyst is present.  Skin is clear.  Normoreflexic.   Left knee reveals a decrease in ROM from 0 to 110 degrees. Positive Clark's patella-femoral compression test for crepitus and pain.  Trace effusion is present.  Lachman's test is negative.  Posterior Drawer test is negative.  Collateral ligamental testing demonstrates pseudo-laxity over the MCL.  McMurrary testing is negative.  no Baker's cyst is present.  Skin is clear.  Normoreflexic.    Right Ankle shows full PROM in plantar and dorsiflexion.  Stable to ligamental testing.  Skin is clear. +5/5 motor strength to plantar/dorsiflexion,  inversion/eversion, and EHL.  Intact sensory to light-touch.  +2/4 dorsalis pedis and posterior tibial pulse.    Left Ankle shows full PROM in plantar and dorsiflexion.  Stable to ligamental testing.  Skin is clear. +5/5 motor strength to plantar/dorsiflexion, inversion/eversion, and EHL.  Intact sensory to light-touch.  +2/4 dorsalis pedis and posterior tibial pulse.          IMAGING:      No new imaging         IMPRESSION:      ICD-10-CM    1. Primary osteoarthritis of right knee  M17.11               PLAN:          At this point I have discussed with the patient today my evaluation and recommended treatment alternatives.  We have discussed the pros and cons of both conservative, as well as, surgical intervention. We have discussed that at this time there is nothing else to do besides get the knee replaced. I have sent a message to our nurse navigator Lorn Ferrier in regards to a possible sooner surgery date at the end of January or after. All questions were answered to their satisfaction today.      On the day of the encounter, a total of 30 minutes was spent on this patient encounter including review of historical information, examination, documentation and post-visit activities. The time documented excludes procedural time.     This note was partially generated using MModal Fluency Direct system, and there may be some incorrect words, spellings, and punctuation that were not noted in checking the note before saving.    I am scribing for, and in the presence of, Eva Fish, MMS, PA-C for services provided on 10/23/2024.  Cecillia Friend, MA, SCRIBE      I personally performed the services described in this documentation, as scribed in my presence, and it is both accurate and complete.  I have reviewed the note and made changes where needed.  I agree with the plan as above.    Eva Fish, PA-C         [facility] - Orthopaedics & Sports Medicine  [facility]

## 2024-11-02 ENCOUNTER — Other Ambulatory Visit (HOSPITAL_COMMUNITY): Payer: Self-pay | Admitting: Physician Assistant

## 2024-11-02 ENCOUNTER — Other Ambulatory Visit: Payer: Self-pay

## 2024-11-02 MED ORDER — REPATHA SURECLICK 140 MG/ML SUBCUTANEOUS PEN INJECTOR
PEN_INJECTOR | SUBCUTANEOUS | 12 refills | Status: AC
  Filled 2024-11-02: qty 6, 84d supply, fill #0

## 2024-11-03 ENCOUNTER — Other Ambulatory Visit (INDEPENDENT_AMBULATORY_CARE_PROVIDER_SITE_OTHER): Payer: Self-pay | Admitting: Internal Medicine

## 2024-11-03 ENCOUNTER — Other Ambulatory Visit: Payer: Self-pay

## 2024-11-04 ENCOUNTER — Other Ambulatory Visit: Payer: Self-pay

## 2024-11-05 ENCOUNTER — Other Ambulatory Visit: Payer: Self-pay

## 2024-11-09 ENCOUNTER — Other Ambulatory Visit: Payer: Self-pay

## 2024-11-09 ENCOUNTER — Ambulatory Visit: Attending: Internal Medicine | Admitting: Internal Medicine

## 2024-11-09 DIAGNOSIS — Z95818 Presence of other cardiac implants and grafts: Secondary | ICD-10-CM | POA: Insufficient documentation

## 2024-11-09 DIAGNOSIS — R55 Syncope and collapse: Secondary | ICD-10-CM | POA: Insufficient documentation

## 2024-11-19 ENCOUNTER — Ambulatory Visit: Payer: Self-pay | Attending: NURSE PRACTITIONER, FAMILY | Admitting: NURSE PRACTITIONER, FAMILY

## 2024-11-19 ENCOUNTER — Ambulatory Visit (HOSPITAL_BASED_OUTPATIENT_CLINIC_OR_DEPARTMENT_OTHER): Payer: Self-pay | Admitting: Family

## 2024-11-19 ENCOUNTER — Other Ambulatory Visit: Payer: Self-pay

## 2024-11-19 ENCOUNTER — Encounter (INDEPENDENT_AMBULATORY_CARE_PROVIDER_SITE_OTHER): Payer: Self-pay | Admitting: NURSE PRACTITIONER, FAMILY

## 2024-11-19 VITALS — BP 128/53 | HR 73 | Ht 62.0 in | Wt 152.3 lb

## 2024-11-19 DIAGNOSIS — Z8249 Family history of ischemic heart disease and other diseases of the circulatory system: Secondary | ICD-10-CM

## 2024-11-19 DIAGNOSIS — R0600 Dyspnea, unspecified: Secondary | ICD-10-CM | POA: Insufficient documentation

## 2024-11-19 DIAGNOSIS — Z87891 Personal history of nicotine dependence: Secondary | ICD-10-CM

## 2024-11-19 DIAGNOSIS — I10 Essential (primary) hypertension: Secondary | ICD-10-CM | POA: Insufficient documentation

## 2024-11-19 DIAGNOSIS — Z95820 Peripheral vascular angioplasty status with implants and grafts: Secondary | ICD-10-CM | POA: Insufficient documentation

## 2024-11-19 DIAGNOSIS — I2510 Atherosclerotic heart disease of native coronary artery without angina pectoris: Secondary | ICD-10-CM | POA: Insufficient documentation

## 2024-11-19 MED ORDER — EZETIMIBE 10 MG TABLET: 10.0000 mg | ORAL_TABLET | Freq: Every evening | ORAL | 3 refills | Status: DC

## 2024-11-19 MED ORDER — NEBIVOLOL 2.5 MG TABLET: 2.5000 mg | ORAL_TABLET | Freq: Every day | ORAL | 3 refills | Status: AC

## 2024-11-19 NOTE — Progress Notes (Unsigned)
 [facility]  [address]  [place] [address]  Phone: [phone]  Fax: [phone]    Encounter Date: 11/19/2024  Patient ID:  [name] [name]       MRN:  [id number]  DOB: 1941/07/07                    PCP: Alan Muff, PA-C    Chief Complaint   Patient presents with    Follow Up     Feeling better since medication changes      SUBJECTIVE:   [name] [name] is a 83 y.o. female. Lipid-lowering Rx history for ASCVD  Lipitor Mevacor Crestor myalgias  Zetia  myalgias  Repatha  ordered              [facility] 12/02/21)  External labs@PCP   [facility]  Zetia  restarted on 12/01/2021      She has a PMH of CAD s/p PCI to the LCx x 2 and more recently IVUS guided PCI on 01/19/2022 to the mid LCx x1 that was overlapped proximally with previously placed stent for unstable angina, HTN, HLD and DMT2. A TTE showed preserved EF at 68.5% and no significant valve disease. She used to follow with Dr. Rexine for lipid management and remains on Repatha  140 mg every two weeks and Zetia  10 mg once daily. In April of 2023, she had syncope while at church. 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 She's following with vascular now and saw Dr. Tommye in 07/2022. 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 A 14 day Holter monitor in 05/2022 showed sinus rhythm with no Afib, VT, heart block or pauses. Average heart rate was 65bpm. Her last transmission from her loop recorder showed bradycardic readings, however her monitor is undersensing, and likely those are inaccurate readings.      She had a cardiac catheterization completed in February 2025, which showed widely  patent stents with mild diffuse disease elsewhere.  At her last office visit, she reported lightheadedness and dizziness.  Her metoprolol  was cut in half to 25 mg once daily, and she returns to the office today to have her loop interrogated.     At her last office visit, her metoprolol  was changed to nebivolol .  Today, she notes that her symptoms have significantly improved since she was started on nebivolol .  She notes that her fatigue is much better.  She denies any change in her symptoms.    ROS   All other ROS Negative    Past Medical History:   Diagnosis Date    Cancer (CMS [facility])     skin cancer -- forehead  2024    Coronary artery disease     CVA (cerebral vascular accident) 2009    Dyspnea on exertion     Essential hypertension     Hyperlipidemia     PONV (postoperative nausea and vomiting)     Skin cancer     Stented coronary artery     Type 2 diabetes mellitus      Past Surgical History:   Procedure Laterality Date    CARDIAC CATHETERIZATION  CATARACT EXTRACTION, BILATERAL Bilateral     HX COLONOSCOPY      HX HEART CATHETERIZATION  2017 and 2018    Stented twice  prox circ  and circ    HX PARTIAL HYSTERECTOMY      tubes and ovaries left intact    HX THYROIDECTOMY  1978    HX WISDOM TEETH EXTRACTION      KNEE SURGERY  2018    muscle removed    LAPAROSCOPIC CHOLECYSTECTOMY      RECONSTRUCTION OF NOSE  1980    SKIN CANCER EXCISION      left side forehead       Current Outpatient Medications   Medication Sig    ALPRAZolam  (XANAX ) 0.5 mg Oral Tablet Take 1 Tablet (0.5 mg total) by mouth Every evening    amLODIPine  (NORVASC ) 5 mg Oral Tablet Take 1 Tablet (5 mg total) by mouth Once a day    bempedoic acid  180 mg Oral Tablet Take 1 Tablet (180 mg total) by mouth Once a day (Patient not taking: Reported on 11/19/2024)    Cholecalciferol, Vitamin D3, 10 mcg (400 unit) Oral Tablet, Chewable Chew Once a day    clopidogreL  (PLAVIX ) 75 mg Oral Tablet Take 1 Tablet (75 mg total) by mouth Daily    DULoxetine (CYMBALTA  DR) 60 mg Oral Capsule, Delayed Release(E.C.) Take 1 Capsule (60 mg total) by mouth Daily    evolocumab  (REPATHA  SURECLICK) 140 mg/mL Subcutaneous Pen Injector Inject 1 pen (140 mg) subcutaneously (under the skin) once every 14 days    ezetimibe  (ZETIA ) 10 mg Oral Tablet Take 1 Tablet (10 mg total) by mouth Every evening    fluticasone  propionate (FLONASE  NASL) by Nasal route    Irbesartan-Hydrochlorothiazide  300-12.5 mg Oral Tablet Take 300 mg by mouth Once a day    Isosorbide  Mononitrate (IMDUR ) 120 mg Oral Tablet Sustained Release 24 hr TAKE 1 TABLET BY MOUTH EVERY MORNING.    JARDIANCE  25 mg Oral Tablet Take 1 Tablet (25 mg total) by mouth Every morning    levothyroxine  (SYNTHROID ) 112 mcg Oral Tablet Take 1 Tablet (112 mcg total) by mouth Every morning    nebivoloL  (BYSTOLIC ) 2.5 mg Oral Tablet Take 1 Tablet (2.5 mg total) by mouth Daily    nitroGLYCERIN  (NITROSTAT ) 0.4 mg Sublingual Tablet, Sublingual PLACE 1 TABLET UNDER TONGUE EVERY 5 MINS, UP TO 3 DOSES AS NEEDED FOR CHEST PAIN    pantoprazole  (PROTONIX ) 40 mg Oral Tablet, Delayed Release (E.C.) Take 1 Tablet (40 mg total) by mouth Daily    POTASSIUM-99 ORAL Take by mouth Once a day    ranolazine  (RANEXA ) 500 mg Oral Tablet Sustained Release 12 hr TAKE 1 TABLET BY MOUTH TWICE DAILY.    valACYclovir (VALTREX) 500 mg Oral Tablet Take 1 Tablet (500 mg total) by mouth Twice daily    vitamin B complex (B COMPLEX-VITAMIN B12 ORAL) Take by mouth Once a day      Allergies  Allergies[1]     Social History     Tobacco Use    Smoking status: Former     Current packs/day: 0.50     Average packs/day: 0.5 packs/day for 10.0 years (5.0 ttl pk-yrs)     Types: Cigarettes     Passive exposure: Past    Smokeless tobacco: Never   Substance Use Topics    Alcohol use: Not Currently     Alcohol/week: 1.0 standard drink of alcohol     Types: 1 Cans of beer per week  Comment: drank long time ago not current.     Family History   Problem Relation Age of Onset    Uterine Cancer  Mother         54's or 46's    Heart Attack Mother     Cardiomyopathy Father     Heart Disease Sister     No Known Problems Brother     No Known Problems Maternal Grandmother     No Known Problems Maternal Grandfather     No Known Problems Paternal Grandmother     No Known Problems Paternal Grandfather     No Known Problems Daughter     No Known Problems Son     No Known Problems Maternal Aunt     No Known Problems Maternal Uncle     No Known Problems Paternal Aunt     No Known Problems Paternal Uncle     Uterine Cancer Niece         72's    No Known Problems Other      OBJECTIVE: Blood pressure (!) 128/53, pulse 73, height 1.575 m (5' 2), weight 69.1 kg (152 lb 5.4 oz), SpO2 96%. Body mass index is 27.86 kg/m.    Physical Exam  Constitutional:       Appearance: Normal appearance.   Cardiovascular:      Rate and Rhythm: Normal rate and regular rhythm.      Heart sounds: Normal heart sounds.   Pulmonary:      Breath sounds: Normal breath sounds.   Musculoskeletal:         General: Normal range of motion.   Skin:     General: Skin is warm and dry.   Neurological:      Mental Status: She is alert and oriented to person, place, and time.           Labs/studies reviewed.     ASSESSMENT/PLAN:   Medical decision making/treatment plan based upon reviewed diagnoses, discussion, and pertinent patient instructions/directions/orders.  Assessment/Plan   1. CAD in native artery    2. S/P angioplasty with stent    3. Dyspnea    4. Essential hypertension      Ms. Shankman reports that she is doing well, and denies any cardiac complaints.  She states that she is tolerating the Bystolic  therapy well, and notes that it has significantly improved her fatigue.  She was sent in refills for her medications.  At this time, will plan to continue her current medication therapy as prescribed.  He will return to the office for a follow-up visit in 8 weeks to have a preop appointment prior to her knee surgery.  She will call with any questions  or concerns prior to her next office visit.    Orders Placed This Encounter    nebivoloL  (BYSTOLIC ) 2.5 mg Oral Tablet    ezetimibe  (ZETIA ) 10 mg Oral Tablet        Medication reconciliation completed.     Return in about 8 weeks (around 01/14/2025), or if symptoms worsen or fail to improve, for needs EKG.    This patient was seen independently with supervising physician available for consultation.    On the day of the encounter, a total of 30 minutes was spent on this patient encounter including direct/indirect care of this patient including initial evaluation, review of laboratory, radiology, diagnostic studies, review of medical record, order entry and coordination of care.      A portion of this documentation may  have been generated using MMODAL voice recognition software and may contain syntax/voice recognition errors.     Richerd Clement, NP    [facility]         [1]   Allergies  Allergen Reactions    Pneumococcal 23-Valent Polysaccharide Vaccine  Other Adverse Reaction (Add comment)     Severe local swelling    Tramadol Nausea/ Vomiting     makes me sick with or without food    Iohexol  Other Adverse Reaction (Add comment) and Hives/ Urticaria      Desc: HIVES SEVERAL HRS S/P IV CONTRAST.. [place] LAST SCAN W/ BENADRYL  PREMED @ [facility] '01/AC.    Lisinopril  Other Adverse Reaction (Add comment)     Other reaction(s): Cough    Other Rash     Other reaction(s): Other (See Comments)  Nickle   When teeth were wired in she place, patient obtained an infection and wiring had to be removed,  Also allergic to silver    Silver Rash    Crestor [Rosuvastatin]      Muscle aches    Lovastatin     Statin [Atorvastatin]      Muscle ache    Iv Contrast Hives/ Urticaria    Nickel Hives/ Urticaria

## 2024-11-19 NOTE — Nursing Note (Signed)
 Pt did not have a med list with them at visit - went over list with patient   Advised patient to check AVS list with medicines at home and call if any discrepancies    -Confirmed correct pharmacy with patient for e-scribing   Loa Idler, RN 11/19/2024 2:27 PM

## 2024-11-23 ENCOUNTER — Encounter (INDEPENDENT_AMBULATORY_CARE_PROVIDER_SITE_OTHER): Payer: Self-pay | Admitting: NURSE PRACTITIONER, FAMILY

## 2024-11-25 ENCOUNTER — Other Ambulatory Visit (HOSPITAL_COMMUNITY): Payer: Self-pay | Admitting: Medical

## 2024-12-02 ENCOUNTER — Other Ambulatory Visit (INDEPENDENT_AMBULATORY_CARE_PROVIDER_SITE_OTHER): Payer: Self-pay | Admitting: Internal Medicine

## 2024-12-10 ENCOUNTER — Ambulatory Visit: Attending: Internal Medicine | Admitting: Internal Medicine

## 2024-12-10 DIAGNOSIS — R55 Syncope and collapse: Secondary | ICD-10-CM | POA: Insufficient documentation

## 2024-12-10 DIAGNOSIS — Z95818 Presence of other cardiac implants and grafts: Secondary | ICD-10-CM | POA: Insufficient documentation

## 2024-12-28 ENCOUNTER — Other Ambulatory Visit (HOSPITAL_BASED_OUTPATIENT_CLINIC_OR_DEPARTMENT_OTHER): Payer: Self-pay | Admitting: Family

## 2024-12-28 ENCOUNTER — Other Ambulatory Visit (HOSPITAL_BASED_OUTPATIENT_CLINIC_OR_DEPARTMENT_OTHER): Payer: Self-pay | Admitting: Family Medicine

## 2024-12-28 ENCOUNTER — Other Ambulatory Visit (HOSPITAL_BASED_OUTPATIENT_CLINIC_OR_DEPARTMENT_OTHER): Payer: Self-pay | Admitting: Adult Reconstructive Orthopaedic Surgery

## 2024-12-28 DIAGNOSIS — Z22321 Carrier or suspected carrier of Methicillin susceptible Staphylococcus aureus: Secondary | ICD-10-CM

## 2024-12-28 DIAGNOSIS — M1711 Unilateral primary osteoarthritis, right knee: Secondary | ICD-10-CM

## 2024-12-28 DIAGNOSIS — G8929 Other chronic pain: Secondary | ICD-10-CM

## 2024-12-28 DIAGNOSIS — Z01818 Encounter for other preprocedural examination: Secondary | ICD-10-CM

## 2024-12-28 DIAGNOSIS — R52 Pain, unspecified: Secondary | ICD-10-CM

## 2024-12-28 DIAGNOSIS — M79609 Pain in unspecified limb: Secondary | ICD-10-CM

## 2025-01-14 ENCOUNTER — Ambulatory Visit (INDEPENDENT_AMBULATORY_CARE_PROVIDER_SITE_OTHER): Payer: Self-pay | Admitting: NURSE PRACTITIONER, FAMILY

## 2025-01-21 ENCOUNTER — Ambulatory Visit (HOSPITAL_BASED_OUTPATIENT_CLINIC_OR_DEPARTMENT_OTHER): Payer: Self-pay | Admitting: Family

## 2025-02-04 ENCOUNTER — Ambulatory Visit (HOSPITAL_BASED_OUTPATIENT_CLINIC_OR_DEPARTMENT_OTHER): Payer: Self-pay | Admitting: Family

## 2025-03-02 ENCOUNTER — Encounter (HOSPITAL_COMMUNITY): Payer: Self-pay

## 2025-03-02 ENCOUNTER — Inpatient Hospital Stay: Admit: 2025-03-02 | Admitting: Adult Reconstructive Orthopaedic Surgery
